# Patient Record
Sex: Female | Born: 1945
Health system: Southern US, Community
[De-identification: ages and names within clinical notes are randomized; demographics above are authoritative.]

## PROBLEM LIST (undated history)

## (undated) DIAGNOSIS — J189 Pneumonia, unspecified organism: Secondary | ICD-10-CM

## (undated) DIAGNOSIS — I82409 Acute embolism and thrombosis of unspecified deep veins of unspecified lower extremity: Secondary | ICD-10-CM

## (undated) DIAGNOSIS — M353 Polymyalgia rheumatica: Secondary | ICD-10-CM

## (undated) DIAGNOSIS — D689 Coagulation defect, unspecified: Secondary | ICD-10-CM

## (undated) DIAGNOSIS — C801 Malignant (primary) neoplasm, unspecified: Secondary | ICD-10-CM

## (undated) DIAGNOSIS — R0600 Dyspnea, unspecified: Secondary | ICD-10-CM

## (undated) DIAGNOSIS — I10 Essential (primary) hypertension: Secondary | ICD-10-CM

## (undated) DIAGNOSIS — E785 Hyperlipidemia, unspecified: Secondary | ICD-10-CM

## (undated) DIAGNOSIS — R112 Nausea with vomiting, unspecified: Secondary | ICD-10-CM

## (undated) DIAGNOSIS — I739 Peripheral vascular disease, unspecified: Secondary | ICD-10-CM

## (undated) DIAGNOSIS — D649 Anemia, unspecified: Secondary | ICD-10-CM

## (undated) DIAGNOSIS — C3490 Malignant neoplasm of unspecified part of unspecified bronchus or lung: Principal | ICD-10-CM

## (undated) DIAGNOSIS — M797 Fibromyalgia: Secondary | ICD-10-CM

## (undated) DIAGNOSIS — Z9889 Other specified postprocedural states: Secondary | ICD-10-CM

## (undated) DIAGNOSIS — Z720 Tobacco use: Secondary | ICD-10-CM

## (undated) HISTORY — DX: Anemia, unspecified: D64.9

## (undated) HISTORY — DX: Coagulation defect, unspecified: D68.9

## (undated) HISTORY — DX: Acute embolism and thrombosis of unspecified deep veins of unspecified lower extremity: I82.409

## (undated) HISTORY — PX: APPENDECTOMY: SHX54

## (undated) HISTORY — PX: LAMINECTOMY: SHX219

## (undated) HISTORY — PX: CHOLECYSTECTOMY: SHX55

## (undated) HISTORY — DX: Malignant neoplasm of unspecified part of unspecified bronchus or lung: C34.90

## (undated) HISTORY — PX: COLONOSCOPY: SHX174

## (undated) HISTORY — PX: PORTACATH PLACEMENT: SHX2246

## (undated) HISTORY — PX: TONSILLECTOMY: SUR1361

## (undated) HISTORY — PX: KNEE ARTHROSCOPY: SHX127

## (undated) HISTORY — DX: Polymyalgia rheumatica: M35.3

---

## 2001-11-25 ENCOUNTER — Encounter: Admission: RE | Admit: 2001-11-25 | Discharge: 2001-11-25 | Payer: Self-pay | Admitting: Family Medicine

## 2001-11-25 ENCOUNTER — Encounter: Payer: Self-pay | Admitting: Family Medicine

## 2003-07-23 ENCOUNTER — Encounter: Admission: RE | Admit: 2003-07-23 | Discharge: 2003-07-23 | Payer: Self-pay | Admitting: Family Medicine

## 2003-07-23 ENCOUNTER — Encounter: Payer: Self-pay | Admitting: Family Medicine

## 2003-11-25 ENCOUNTER — Encounter: Admission: RE | Admit: 2003-11-25 | Discharge: 2003-11-25 | Payer: Self-pay | Admitting: Orthopedic Surgery

## 2003-12-02 ENCOUNTER — Ambulatory Visit (HOSPITAL_BASED_OUTPATIENT_CLINIC_OR_DEPARTMENT_OTHER): Admission: RE | Admit: 2003-12-02 | Discharge: 2003-12-02 | Payer: Self-pay | Admitting: Orthopedic Surgery

## 2003-12-10 ENCOUNTER — Inpatient Hospital Stay (HOSPITAL_COMMUNITY): Admission: EM | Admit: 2003-12-10 | Discharge: 2003-12-13 | Payer: Self-pay | Admitting: Emergency Medicine

## 2003-12-10 ENCOUNTER — Ambulatory Visit (HOSPITAL_COMMUNITY): Admission: RE | Admit: 2003-12-10 | Discharge: 2003-12-10 | Payer: Self-pay | Admitting: Orthopaedic Surgery

## 2003-12-16 ENCOUNTER — Ambulatory Visit (HOSPITAL_COMMUNITY): Admission: RE | Admit: 2003-12-16 | Discharge: 2003-12-16 | Payer: Self-pay | Admitting: Family Medicine

## 2004-05-24 ENCOUNTER — Ambulatory Visit (HOSPITAL_COMMUNITY): Admission: RE | Admit: 2004-05-24 | Discharge: 2004-05-24 | Payer: Self-pay | Admitting: Family Medicine

## 2004-08-15 ENCOUNTER — Other Ambulatory Visit: Admission: RE | Admit: 2004-08-15 | Discharge: 2004-08-15 | Payer: Self-pay | Admitting: Family Medicine

## 2006-05-28 ENCOUNTER — Ambulatory Visit (HOSPITAL_COMMUNITY): Admission: RE | Admit: 2006-05-28 | Discharge: 2006-05-28 | Payer: Self-pay | Admitting: Family Medicine

## 2006-05-28 ENCOUNTER — Inpatient Hospital Stay (HOSPITAL_COMMUNITY): Admission: EM | Admit: 2006-05-28 | Discharge: 2006-05-29 | Payer: Self-pay | Admitting: Emergency Medicine

## 2006-05-28 ENCOUNTER — Encounter: Payer: Self-pay | Admitting: Vascular Surgery

## 2006-06-21 ENCOUNTER — Encounter: Admission: RE | Admit: 2006-06-21 | Discharge: 2006-06-21 | Payer: Self-pay | Admitting: Family Medicine

## 2006-09-23 ENCOUNTER — Encounter: Admission: RE | Admit: 2006-09-23 | Discharge: 2006-09-23 | Payer: Self-pay | Admitting: Family Medicine

## 2006-11-07 ENCOUNTER — Encounter: Admission: RE | Admit: 2006-11-07 | Discharge: 2006-11-07 | Payer: Self-pay | Admitting: Family Medicine

## 2006-11-08 ENCOUNTER — Encounter: Admission: RE | Admit: 2006-11-08 | Discharge: 2006-11-08 | Payer: Self-pay | Admitting: Family Medicine

## 2007-01-06 ENCOUNTER — Ambulatory Visit (HOSPITAL_COMMUNITY): Admission: RE | Admit: 2007-01-06 | Discharge: 2007-01-07 | Payer: Self-pay | Admitting: Neurosurgery

## 2007-11-04 ENCOUNTER — Encounter: Admission: RE | Admit: 2007-11-04 | Discharge: 2007-11-04 | Payer: Self-pay | Admitting: Family Medicine

## 2008-06-15 ENCOUNTER — Other Ambulatory Visit: Admission: RE | Admit: 2008-06-15 | Discharge: 2008-06-15 | Payer: Self-pay | Admitting: Family Medicine

## 2008-08-03 ENCOUNTER — Observation Stay (HOSPITAL_COMMUNITY): Admission: EM | Admit: 2008-08-03 | Discharge: 2008-08-05 | Payer: Self-pay | Admitting: Emergency Medicine

## 2008-08-03 ENCOUNTER — Encounter (INDEPENDENT_AMBULATORY_CARE_PROVIDER_SITE_OTHER): Payer: Self-pay | Admitting: Emergency Medicine

## 2008-08-03 ENCOUNTER — Ambulatory Visit: Payer: Self-pay | Admitting: Surgery

## 2008-08-18 ENCOUNTER — Ambulatory Visit: Payer: Self-pay | Admitting: Oncology

## 2008-09-21 LAB — CBC & DIFF AND RETIC
BASO%: 0.3 % (ref 0.0–2.0)
IRF: 0.31 (ref 0.130–0.330)
MCHC: 34.9 g/dL (ref 32.0–36.0)
MONO#: 0.4 10*3/uL (ref 0.1–0.9)
RBC: 4.84 10*6/uL (ref 3.70–5.32)
Retic %: 0.8 % (ref 0.4–2.3)
WBC: 6.7 10*3/uL (ref 3.9–10.0)
lymph#: 1.6 10*3/uL (ref 0.9–3.3)

## 2008-09-21 LAB — MORPHOLOGY
PLT EST: ADEQUATE
RBC Comments: NORMAL

## 2008-09-23 LAB — FACTOR 8 ASSAY: Coagulation Factor VIII: 145 % — ABNORMAL HIGH (ref 73–140)

## 2008-09-29 LAB — COMPREHENSIVE METABOLIC PANEL
ALT: 19 U/L (ref 0–35)
Alkaline Phosphatase: 43 U/L (ref 39–117)
Glucose, Bld: 95 mg/dL (ref 70–99)
Sodium: 142 mEq/L (ref 135–145)
Total Bilirubin: 0.4 mg/dL (ref 0.3–1.2)
Total Protein: 6.9 g/dL (ref 6.0–8.3)

## 2008-09-29 LAB — LUPUS ANTICOAGULANT PANEL
DRVVT: 61.8 secs — ABNORMAL HIGH (ref 36.1–47.0)
PTTLA 4:1 Mix: 41.7 secs (ref 36.3–48.8)

## 2008-09-29 LAB — D-DIMER, QUANTITATIVE: D-Dimer, Quant: 0.22 ug/mL-FEU (ref 0.00–0.48)

## 2008-10-08 ENCOUNTER — Encounter: Payer: Self-pay | Admitting: Family Medicine

## 2008-11-24 ENCOUNTER — Encounter: Admission: RE | Admit: 2008-11-24 | Discharge: 2008-11-24 | Payer: Self-pay | Admitting: Family Medicine

## 2009-09-13 ENCOUNTER — Ambulatory Visit: Payer: Self-pay | Admitting: Family Medicine

## 2009-09-13 DIAGNOSIS — Z86718 Personal history of other venous thrombosis and embolism: Secondary | ICD-10-CM

## 2009-09-13 DIAGNOSIS — I82409 Acute embolism and thrombosis of unspecified deep veins of unspecified lower extremity: Secondary | ICD-10-CM | POA: Insufficient documentation

## 2009-09-13 DIAGNOSIS — H409 Unspecified glaucoma: Secondary | ICD-10-CM | POA: Insufficient documentation

## 2009-09-13 DIAGNOSIS — I10 Essential (primary) hypertension: Secondary | ICD-10-CM | POA: Insufficient documentation

## 2009-09-13 DIAGNOSIS — E785 Hyperlipidemia, unspecified: Secondary | ICD-10-CM | POA: Insufficient documentation

## 2009-09-13 DIAGNOSIS — D649 Anemia, unspecified: Secondary | ICD-10-CM | POA: Insufficient documentation

## 2009-09-13 LAB — CONVERTED CEMR LAB
Bilirubin Urine: NEGATIVE
INR: 2.1
Ketones, urine, test strip: NEGATIVE
Nitrite: NEGATIVE
Protein, U semiquant: NEGATIVE
Prothrombin Time: 17.8 s
Urobilinogen, UA: 1

## 2009-09-21 LAB — CONVERTED CEMR LAB
ALT: 18 units/L (ref 0–35)
AST: 18 units/L (ref 0–37)
Alkaline Phosphatase: 42 units/L (ref 39–117)
BUN: 12 mg/dL (ref 6–23)
Basophils Absolute: 0 10*3/uL (ref 0.0–0.1)
Bilirubin, Direct: 0 mg/dL (ref 0.0–0.3)
Creatinine, Ser: 0.9 mg/dL (ref 0.4–1.2)
HCT: 43.6 % (ref 36.0–46.0)
Hemoglobin: 14.9 g/dL (ref 12.0–15.0)
Lymphocytes Relative: 26.8 % (ref 12.0–46.0)
MCHC: 34.1 g/dL (ref 30.0–36.0)
MCV: 92.6 fL (ref 78.0–100.0)
Neutrophils Relative %: 63.2 % (ref 43.0–77.0)
RBC: 4.71 M/uL (ref 3.87–5.11)
RDW: 12.5 % (ref 11.5–14.6)
TSH: 0.97 microintl units/mL (ref 0.35–5.50)
Total Protein: 7 g/dL (ref 6.0–8.3)

## 2009-09-22 ENCOUNTER — Encounter: Payer: Self-pay | Admitting: Family Medicine

## 2009-09-27 ENCOUNTER — Ambulatory Visit: Payer: Self-pay | Admitting: Family Medicine

## 2009-09-27 DIAGNOSIS — E538 Deficiency of other specified B group vitamins: Secondary | ICD-10-CM

## 2009-09-30 ENCOUNTER — Encounter (INDEPENDENT_AMBULATORY_CARE_PROVIDER_SITE_OTHER): Payer: Self-pay | Admitting: *Deleted

## 2009-10-01 ENCOUNTER — Encounter: Admission: RE | Admit: 2009-10-01 | Discharge: 2009-10-01 | Payer: Self-pay | Admitting: Orthopedic Surgery

## 2009-10-04 ENCOUNTER — Ambulatory Visit: Payer: Self-pay | Admitting: Family Medicine

## 2009-10-06 ENCOUNTER — Ambulatory Visit: Payer: Self-pay | Admitting: Gastroenterology

## 2009-10-11 ENCOUNTER — Ambulatory Visit: Payer: Self-pay | Admitting: Family Medicine

## 2009-10-18 ENCOUNTER — Ambulatory Visit: Payer: Self-pay | Admitting: Family Medicine

## 2009-10-18 LAB — CONVERTED CEMR LAB
INR: 3
Prothrombin Time: 20.8 s

## 2009-10-25 ENCOUNTER — Ambulatory Visit: Payer: Self-pay | Admitting: Family Medicine

## 2009-11-01 ENCOUNTER — Ambulatory Visit: Payer: Self-pay | Admitting: Family Medicine

## 2009-11-08 ENCOUNTER — Ambulatory Visit: Payer: Self-pay | Admitting: Family Medicine

## 2009-11-15 ENCOUNTER — Ambulatory Visit: Payer: Self-pay | Admitting: Family Medicine

## 2009-11-21 ENCOUNTER — Encounter: Payer: Self-pay | Admitting: Gastroenterology

## 2009-11-21 ENCOUNTER — Telehealth: Payer: Self-pay | Admitting: Gastroenterology

## 2009-11-28 ENCOUNTER — Ambulatory Visit: Payer: Self-pay | Admitting: Gastroenterology

## 2009-12-14 ENCOUNTER — Ambulatory Visit: Payer: Self-pay | Admitting: Family Medicine

## 2009-12-26 ENCOUNTER — Ambulatory Visit: Payer: Self-pay | Admitting: Family Medicine

## 2009-12-26 DIAGNOSIS — M545 Low back pain: Secondary | ICD-10-CM | POA: Insufficient documentation

## 2009-12-26 LAB — CONVERTED CEMR LAB
INR: 3.1
Ketones, urine, test strip: NEGATIVE
Nitrite: NEGATIVE
Prothrombin Time: 21.2 s
Urobilinogen, UA: 0.2
pH: 6

## 2010-01-02 ENCOUNTER — Ambulatory Visit: Payer: Self-pay | Admitting: Family Medicine

## 2010-01-12 ENCOUNTER — Ambulatory Visit: Payer: Self-pay | Admitting: Family Medicine

## 2010-01-12 LAB — CONVERTED CEMR LAB: Prothrombin Time: 18.2 s

## 2010-01-19 ENCOUNTER — Ambulatory Visit: Payer: Self-pay | Admitting: Family Medicine

## 2010-01-26 ENCOUNTER — Ambulatory Visit: Payer: Self-pay | Admitting: Family Medicine

## 2010-02-02 ENCOUNTER — Ambulatory Visit: Payer: Self-pay | Admitting: Family Medicine

## 2010-02-09 ENCOUNTER — Ambulatory Visit: Payer: Self-pay | Admitting: Family Medicine

## 2010-03-02 ENCOUNTER — Ambulatory Visit: Payer: Self-pay | Admitting: Family Medicine

## 2010-03-02 ENCOUNTER — Telehealth: Payer: Self-pay | Admitting: Family Medicine

## 2010-04-05 ENCOUNTER — Ambulatory Visit: Payer: Self-pay | Admitting: Family Medicine

## 2010-04-05 LAB — CONVERTED CEMR LAB
INR: 1.7
Prothrombin Time: 16.2 s

## 2010-04-18 ENCOUNTER — Ambulatory Visit: Payer: Self-pay | Admitting: Family Medicine

## 2010-04-18 DIAGNOSIS — M79609 Pain in unspecified limb: Secondary | ICD-10-CM | POA: Insufficient documentation

## 2010-05-18 ENCOUNTER — Ambulatory Visit: Payer: Self-pay | Admitting: Family Medicine

## 2010-05-19 LAB — CONVERTED CEMR LAB
AST: 20 units/L (ref 0–37)
Alkaline Phosphatase: 37 units/L — ABNORMAL LOW (ref 39–117)
BUN: 20 mg/dL (ref 6–23)
Basophils Absolute: 0 10*3/uL (ref 0.0–0.1)
Basophils Relative: 0.5 % (ref 0.0–3.0)
Bilirubin, Direct: 0.1 mg/dL (ref 0.0–0.3)
Calcium: 10.4 mg/dL (ref 8.4–10.5)
Creatinine, Ser: 0.8 mg/dL (ref 0.4–1.2)
Eosinophils Absolute: 0.1 10*3/uL (ref 0.0–0.7)
Eosinophils Relative: 2.5 % (ref 0.0–5.0)
GFR calc non Af Amer: 72.27 mL/min (ref >60)
HCT: 44.2 % (ref 36.0–46.0)
Lymphocytes Relative: 27.8 % (ref 12.0–46.0)
Lymphs Abs: 1.6 10*3/uL (ref 0.7–4.0)
MCHC: 34.5 g/dL (ref 30.0–36.0)
MCV: 92 fL (ref 78.0–100.0)
Monocytes Absolute: 0.5 10*3/uL (ref 0.1–1.0)
Monocytes Relative: 8.1 % (ref 3.0–12.0)
Potassium: 4.4 meq/L (ref 3.5–5.1)
Total CHOL/HDL Ratio: 3
WBC: 5.6 10*3/uL (ref 4.5–10.5)

## 2010-07-18 ENCOUNTER — Ambulatory Visit: Payer: Self-pay | Admitting: Family Medicine

## 2010-12-05 ENCOUNTER — Ambulatory Visit
Admission: RE | Admit: 2010-12-05 | Discharge: 2010-12-05 | Payer: Self-pay | Source: Home / Self Care | Attending: Family Medicine | Admitting: Family Medicine

## 2010-12-16 ENCOUNTER — Encounter: Payer: Self-pay | Admitting: Orthopedic Surgery

## 2010-12-21 ENCOUNTER — Ambulatory Visit
Admission: RE | Admit: 2010-12-21 | Discharge: 2010-12-21 | Payer: Self-pay | Source: Home / Self Care | Attending: Family Medicine | Admitting: Family Medicine

## 2010-12-21 DIAGNOSIS — M25519 Pain in unspecified shoulder: Secondary | ICD-10-CM | POA: Insufficient documentation

## 2010-12-26 NOTE — Assessment & Plan Note (Signed)
Summary: b12 inj/njr  Nurse Visit   Allergies: 1)  ! Morphine 2)  ! Codeine  Medication Administration  Injection # 1:    Medication: Vit B12 1000 mcg    Diagnosis: VITAMIN B12 DEFICIENCY (ICD-266.2)    Route: IM    Site: L deltoid    Exp Date: 07/2011    Lot #: [id number]    Mfr: American Regent    Patient tolerated injection without complications    Given by: Duard Brady LPN (March 02, 4009 10:14 AM)  Orders Added: 1)  Vit B12 1000 mcg [J3420] 2)  Admin of Therapeutic Inj  intramuscular or subcutaneous [27253]

## 2010-12-26 NOTE — Assessment & Plan Note (Signed)
Summary: B-12 INJ/CJR  Nurse Visit   Allergies: 1)  ! Morphine 2)  ! Codeine  Medication Administration  Injection # 1:    Medication: Vit B12 1000 mcg    Diagnosis: VITAMIN B12 DEFICIENCY (ICD-266.2)    Route: IM    Site: L deltoid    Exp Date: 9/12    Lot #: [id number]    Mfr: American Regent    Patient tolerated injection without complications    Given by: Alfred Levins, CMA (January 26, 2010 9:56 AM)  Orders Added: 1)  Vit B12 1000 mcg [J3420] 2)  Admin of Therapeutic Inj  intramuscular or subcutaneous [73710]

## 2010-12-26 NOTE — Assessment & Plan Note (Signed)
Summary: rov/mm   Vital Signs:  Patient profile:   65 year old female Weight:      140 pounds BP sitting:   110 / 74  (left arm) Cuff size:   regular  Vitals Entered By: Raechel Ache, RN (May 18, 2010 8:43 AM) CC: ROV, fasting for labs. C/o back pain.   History of Present Illness: Here for labs and to discuss low back pain. For the past 8 months she has had daily sharp pains in the right lower back. These are worse after she has been sitiing awhile or first thing in the mornings. No radiaiton to the legs. She takes nothing for this.   Allergies: 1)  ! Morphine 2)  ! Codeine  Past History:  Past Medical History: Reviewed history from 09/13/2009 and no changes required. DVT, hx of, has had 2 in the right calf and one in the left calf. Now on lifetime anticoagulation.  hypercoagulability workup was negative per Dr. Cyndie Chime glaucoma, sees Dr. Hazle Quant Hyperlipidemia Hypertension Anemia-NOS  Past Surgical History: Reviewed history from 12/26/2009 and no changes required. Appendectomy Cholecystectomy Tonsillectomy right knee arthroscopy per Dr. Chaney Malling 2005 Lumbar laminectomy per Dr. Newell Coral, 2009 colonoscopy 11-28-09 per Dr. Arlyce Dice, normal, repeat in 10 yrs  Review of Systems  The patient denies anorexia, fever, weight loss, weight gain, vision loss, decreased hearing, hoarseness, chest pain, syncope, dyspnea on exertion, peripheral edema, prolonged cough, headaches, hemoptysis, abdominal pain, melena, hematochezia, severe indigestion/heartburn, hematuria, incontinence, genital sores, muscle weakness, suspicious skin lesions, transient blindness, difficulty walking, depression, unusual weight change, abnormal bleeding, enlarged lymph nodes, angioedema, breast masses, and testicular masses.    Physical Exam  General:  Well-developed,well-nourished,in no acute distress; alert,appropriate and cooperative throughout examination Neck:  No deformities, masses, or tenderness  noted. Lungs:  Normal respiratory effort, chest expands symmetrically. Lungs are clear to auscultation, no crackles or wheezes. Heart:  Normal rate and regular rhythm. S1 and S2 normal without gallop, murmur, click, rub or other extra sounds. Msk:  tender in the lower back over the area of her previous surgery. No spasm, full ROM   Impression & Recommendations:  Problem # 1:  HYPERTENSION (ICD-401.9)  Her updated medication list for this problem includes:    Hydrochlorothiazide 25 Mg Tabs (Hydrochlorothiazide) ..... One by mouth daily  Orders: Venipuncture ([address]) TLB-Lipid Panel (80061-LIPID) TLB-BMP (Basic Metabolic Panel-BMET) (80048-METABOL) TLB-CBC Platelet - w/Differential (85025-CBCD) TLB-Hepatic/Liver Function Pnl (80076-HEPATIC) TLB-TSH (Thyroid Stimulating Hormone) (84443-TSH)  Problem # 2:  HYPERLIPIDEMIA (ICD-272.4)  Her updated medication list for this problem includes:    Simvastatin 20 Mg Tabs (Simvastatin) ..... One by mouth daily  Problem # 3:  BACK PAIN, LUMBAR (ICD-724.2)  Her updated medication list for this problem includes:    Tramadol Hcl 50 Mg Tabs (Tramadol hcl) .Marland Kitchen... 1 q 4 hours as needed pain  Orders: TLB-Uric Acid, Blood (84550-URIC)  Problem # 4:  VITAMIN B12 DEFICIENCY (ICD-266.2)  Orders: Vit B12 1000 mcg (J3420) Admin of Therapeutic Inj  intramuscular or subcutaneous (60454)  Complete Medication List: 1)  Coumadin 5 Mg Tabs (Warfarin sodium) .... As directed 2)  Hydrochlorothiazide 25 Mg Tabs (Hydrochlorothiazide) .... One by mouth daily 3)  Simvastatin 20 Mg Tabs (Simvastatin) .... One by mouth daily 4)  Caltrate 600+d 600-400 Mg-unit Tabs (Calcium carbonate-vitamin d) .... One tablet by mouth once daily 5)  Tramadol Hcl 50 Mg Tabs (Tramadol hcl) .Marland Kitchen.. 1 q 4 hours as needed pain 6)  Chelated Potassium 99 Mg Tabs (Potassium) .Marland Kitchen.. 1 once  daily  Other Orders: Protime ([address]UE) Fingerstick (45409)  Patient Instructions: 1)  get labs.  Try Tramadol again for pain.  Prescriptions: TRAMADOL HCL 50 MG TABS (TRAMADOL HCL) 1 q 4 hours as needed pain  #60 x 5   Entered and Authorized by:   [name] MD   Signed by:   [name] MD on 05/18/2010   Method used:   Print then Give to Patient   RxID:   [id number]    Medication Administration  Injection # 1:    Medication: Vit B12 1000 mcg    Diagnosis: VITAMIN B12 DEFICIENCY (ICD-266.2)    Route: IM    Site: R deltoid    Exp Date: 04/13    Lot #: [id number]    Mfr: APP    Patient tolerated injection without complications    Given by: Raechel Ache, RN (May 18, 2010 9:25 AM)  Orders Added: 1)  Venipuncture [96295] 2)  TLB-Lipid Panel [80061-LIPID] 3)  TLB-BMP (Basic Metabolic Panel-BMET) [80048-METABOL] 4)  TLB-CBC Platelet - w/Differential [85025-CBCD] 5)  TLB-Hepatic/Liver Function Pnl [80076-HEPATIC] 6)  TLB-TSH (Thyroid Stimulating Hormone) [84443-TSH] 7)  TLB-Uric Acid, Blood [84550-URIC] 8)  Est. Patient Level IV [28413] 9)  Vit B12 1000 mcg [J3420] 10)  Admin of Therapeutic Inj  intramuscular or subcutaneous [96372] 11)  Protime [85610QW] 12)  Fingerstick [36416]   Laboratory Results   Blood Tests   Date/Time Recieved: May 18, 2010 9:29 AM  Date/Time Reported: May 18, 2010 9:29 AM    INR: 3.1   (Normal Range: 0.88-1.12   Therap INR: 2.0-3.5) Comments: Wynona Canes, CMA  May 18, 2010 9:29 AM       ANTICOAGULATION RECORD PREVIOUS REGIMEN & LAB RESULTS Anticoagulation Diagnosis:  v58.83,453.41 on  01/12/2010 Previous INR Goal Range:  2.0-3.0 on  01/12/2010 Previous INR:  1.7 on  04/05/2010 Previous Coumadin Dose(mg):  5mg  on w,sat,sun & 7.5mg  other days on  01/12/2010 Previous Regimen:  7.5mg . QD on  04/05/2010 Previous Coagulation Comments:  Pt. will d/c 10/21/09 due to surgery, will resume dosage after and F/U 2 wks.  on  10/18/2009  NEW REGIMEN & LAB RESULTS Current INR: 3.1 Current Coumadin Dose(mg): 7.5mg  qd Regimen:  7.5mg . QD  (no change)       Repeat testing in: 4 weeks MEDICATIONS COUMADIN 5 MG TABS (WARFARIN SODIUM) as directed HYDROCHLOROTHIAZIDE 25 MG TABS (HYDROCHLOROTHIAZIDE) one by mouth daily SIMVASTATIN 20 MG TABS (SIMVASTATIN) one by mouth daily CALTRATE 600+D 600-400 MG-UNIT TABS (CALCIUM CARBONATE-VITAMIN D) one tablet by mouth once daily TRAMADOL HCL 50 MG TABS (TRAMADOL HCL) 1 q 4 hours as needed pain CHELATED POTASSIUM 99 MG TABS (POTASSIUM) 1 once daily   Anticoagulation Visit Questionnaire      Coumadin dose missed/changed:  No      Abnormal Bleeding Symptoms:  No   Any diet changes including alcohol intake, vegetables or greens since the last visit:  No Any illnesses or hospitalizations since the last visit:  No Any signs of clotting since the last visit (including chest discomfort, dizziness, shortness of breath, arm tingling, slurred speech, swelling or redness in leg):  No

## 2010-12-26 NOTE — Assessment & Plan Note (Signed)
Summary: ?uti/njr   Vital Signs:  Patient profile:   65 year old female Weight:      144 pounds Temp:     98.4 degrees F oral BP sitting:   124 / 84  (left arm) Cuff size:   regular  Vitals Entered By: [name], CMA (December 26, 2009 11:33 AM) CC: uti   History of Present Illness: Here for 2 reasons. First she has been having some intermittent sharp pains in the lower back which remind her of what it felt like before her surgery a few years ago. The pains  do not  radiate to the legs, no numbness or weakness in the legs. Using Tylrnol only. Second for several days she has some urgency to urinate and some burning. Drinking plenty of water.   Allergies: 1)  ! Morphine 2)  ! Codeine  Past History:  Past Medical History: Reviewed history from 09/13/2009 and no changes required. DVT, hx of, has had 2 in the right calf and one in the left calf. Now on lifetime anticoagulation.  hypercoagulability workup was negative per Dr. Cyndie [name] glaucoma, sees Dr. Hazle [name] Hyperlipidemia Hypertension Anemia-NOS  Past Surgical History: Appendectomy Cholecystectomy Tonsillectomy right knee arthroscopy per Dr. Chaney [name] 2005 Lumbar laminectomy per Dr. Newell [name], 2009 colonoscopy 11-28-09 per Dr. Arlyce [name], normal, repeat in 10 yrs  Review of Systems  The patient denies anorexia, fever, weight loss, weight gain, vision loss, decreased hearing, hoarseness, chest pain, syncope, dyspnea on exertion, peripheral edema, prolonged cough, headaches, hemoptysis, abdominal pain, melena, hematochezia, severe indigestion/heartburn, hematuria, incontinence, genital sores, muscle weakness, suspicious skin lesions, transient blindness, difficulty walking, depression, unusual weight change, abnormal bleeding, enlarged lymph nodes, angioedema, breast masses, and testicular masses.    Physical Exam  General:  Well-developed,well-nourished,in no acute distress; alert,appropriate and cooperative throughout  examination Abdomen:  Bowel sounds positive,abdomen soft and non-tender without masses, organomegaly or hernias noted. Msk:  No deformity or scoliosis noted of thoracic or lumbar spine.  Negative SLR   Impression & Recommendations:  Problem # 1:  UTI (ICD-599.0)  Her updated medication list for this problem includes:    Macrobid 100 Mg Caps (Nitrofurantoin monohyd macro) .Marland Kitchen..Marland Kitchen Two times a day  Problem # 2:  BACK PAIN, LUMBAR (ICD-724.2)  Her updated medication list for this problem includes:    Tramadol Hcl 50 Mg Tabs (Tramadol hcl) .Marland Kitchen... 1 q 4 hours as needed pain  Complete Medication List: 1)  Coumadin 5 Mg Tabs (Warfarin sodium) .... As directed 2)  Hydrochlorothiazide 25 Mg Tabs (Hydrochlorothiazide) .... One by mouth daily 3)  Simvastatin 20 Mg Tabs (Simvastatin) .... One by mouth daily 4)  Caltrate 600+d 600-400 Mg-unit Tabs (Calcium carbonate-vitamin d) .... One tablet by mouth once daily 5)  Tramadol Hcl 50 Mg Tabs (Tramadol hcl) .Marland Kitchen.. 1 q 4 hours as needed pain 6)  Macrobid 100 Mg Caps (Nitrofurantoin monohyd macro) .... Two times a day  Other Orders: Fingerstick (36416) TLB-Udip ONLY (81003-UDIP) Protime (16[address]UE) Vit B12 1000 mcg (J3420) Admin of Therapeutic Inj  intramuscular or subcutaneous (45409)  Patient Instructions: 1)  Please schedule a follow-up appointment as needed .  Prescriptions: MACROBID 100 MG CAPS (NITROFURANTOIN MONOHYD MACRO) two times a day  #14 x 0   Entered and Authorized by:   [name] MD   Signed by:   [name] MD on 12/26/2009   Method used:   Print then Give to Patient   RxID:   [id number] COUMADIN 5 MG TABS (WARFARIN SODIUM) as  directed  #100 x 11   Entered and Authorized by:   [name] MD   Signed by:   [name] MD on 12/26/2009   Method used:   Print then Give to Patient   RxID:   [id number] TRAMADOL HCL 50 MG TABS (TRAMADOL HCL) 1 q 4 hours as needed pain  #60 x 2   Entered and Authorized by:    [name] MD   Signed by:   [name] MD on 12/26/2009   Method used:   Print then Give to Patient   RxID:   [id number]   Laboratory Results   Urine Tests    Routine Urinalysis   Color: yellow Appearance: Clear Glucose: negative   (Normal Range: Negative) Bilirubin: negative   (Normal Range: Negative) Ketone: negative   (Normal Range: Negative) Spec. Gravity: 1.010   (Normal Range: 1.003-1.035) Blood: 1+   (Normal Range: Negative) pH: 6.0   (Normal Range: 5.0-8.0) Protein: negative   (Normal Range: Negative) Urobilinogen: 0.2   (Normal Range: 0-1) Nitrite: negative   (Normal Range: Negative) Leukocyte Esterace: trace   (Normal Range: Negative)    Comments: [name]  December 26, 2009 11:25 AM   Blood Tests     PT: 21.2 s   (Normal Range: 10.6-13.4)  INR: 3.1   (Normal Range: 0.88-1.12   Therap INR: 2.0-3.5) Comments: [name]  December 26, 2009 11:42 AM        ANTICOAGULATION RECORD PREVIOUS REGIMEN & LAB RESULTS   Previous INR:  1.5 on  12/14/2009  Previous Regimen:  7.5mg  QD on  12/14/2009 Previous Coagulation Comments:  Pt. will d/c 10/21/09 due to surgery, will resume dosage after and F/U 2 wks.  on  10/18/2009  NEW REGIMEN & LAB RESULTS Current INR: 3.1 Regimen: 7.5mg  QD  (no change)   Anticoagulation Visit Questionnaire Coumadin dose missed/changed:  No Abnormal Bleeding Symptoms:  No  Any diet changes including alcohol intake, vegetables or greens since the last visit:  No Any illnesses or hospitalizations since the last visit:  No Any signs of clotting since the last visit (including chest discomfort, dizziness, shortness of breath, arm tingling, slurred speech, swelling or redness in leg):  No  MEDICATIONS COUMADIN 5 MG TABS (WARFARIN SODIUM) as directed HYDROCHLOROTHIAZIDE 25 MG TABS (HYDROCHLOROTHIAZIDE) one by mouth daily SIMVASTATIN 20 MG TABS (SIMVASTATIN) one by mouth daily CALTRATE 600+D 600-400 MG-UNIT TABS  (CALCIUM CARBONATE-VITAMIN D) one tablet by mouth once daily TRAMADOL HCL 50 MG TABS (TRAMADOL HCL) 1 q 4 hours as needed pain MACROBID 100 MG CAPS (NITROFURANTOIN MONOHYD MACRO) two times a day    Medication Administration  Injection # 1:    Medication: Vit B12 1000 mcg    Diagnosis: VITAMIN B12 DEFICIENCY (ICD-266.2)    Route: IM    Site: L deltoid    Exp Date: 9/12    Lot #: [id number]    Mfr: American Regent    Patient tolerated injection without complications    Given by: [name], CMA (December 26, 2009 11:50 AM)  Orders Added: 1)  Fingerstick [36416] 2)  TLB-Udip ONLY [81003-UDIP] 3)  Protime [57322GU] 4)  Vit B12 1000 mcg [J3420] 5)  Admin of Therapeutic Inj  intramuscular or subcutaneous [96372] 6)  Est. Patient Level IV [54270]  Appended Document: ?uti/njr   ANTICOAGULATION RECORD PREVIOUS REGIMEN & LAB RESULTS   Previous INR:  3.1 on  12/26/2009  Previous Regimen:  7.5mg  QD on  12/14/2009 Previous  Coagulation Comments:  Pt. will d/c 10/21/09 due to surgery, will resume dosage after and F/U 2 wks.  on  10/18/2009  NEW REGIMEN & LAB RESULTS Current INR: 3.1 Regimen: 5mg . Wed. Sat. and Sun. all others 7.5mg .  Repeat testing in: 2 weeks  Anticoagulation Visit Questionnaire Coumadin dose missed/changed:  No Abnormal Bleeding Symptoms:  No  Any diet changes including alcohol intake, vegetables or greens since the last visit:  No Any illnesses or hospitalizations since the last visit:  No Any signs of clotting since the last visit (including chest discomfort, dizziness, shortness of breath, arm tingling, slurred speech, swelling or redness in leg):  No  MEDICATIONS COUMADIN 5 MG TABS (WARFARIN SODIUM) as directed HYDROCHLOROTHIAZIDE 25 MG TABS (HYDROCHLOROTHIAZIDE) one by mouth daily SIMVASTATIN 20 MG TABS (SIMVASTATIN) one by mouth daily CALTRATE 600+D 600-400 MG-UNIT TABS (CALCIUM CARBONATE-VITAMIN D) one tablet by mouth once daily TRAMADOL HCL 50 MG TABS  (TRAMADOL HCL) 1 q 4 hours as needed pain MACROBID 100 MG CAPS (NITROFURANTOIN MONOHYD MACRO) two times a day    Laboratory Results   Blood Tests     PT: 21.2 s   (Normal Range: 10.6-13.4)  INR: 3.1   (Normal Range: 0.88-1.12   Therap INR: 2.0-3.5) Comments: [name]  December 26, 2009 12:17 PM

## 2010-12-26 NOTE — Assessment & Plan Note (Signed)
Summary: b12 inj/njr  Nurse Visit   Allergies: 1)  ! Morphine 2)  ! Codeine  Medication Administration  Injection # 1:    Medication: Vit B12 1000 mcg    Diagnosis: VITAMIN B12 DEFICIENCY (ICD-266.2)    Route: IM    Site: L deltoid    Exp Date: 07/2011    Lot #: [id number]    Mfr: American Regent    Patient tolerated injection without complications    Given by: Raechel Ache, RN (February 09, 2010 10:10 AM)  Orders Added: 1)  Vit B12 1000 mcg [J3420] 2)  Admin of Therapeutic Inj  intramuscular or subcutaneous [96045]

## 2010-12-26 NOTE — Assessment & Plan Note (Signed)
Summary: pt and add b-12 for 266.2/per Dr Fry/cjr rsc per husb/njr  Nurse Visit   Allergies: 1)  ! Morphine 2)  ! Codeine Laboratory Results   Blood Tests     PT: 16.2 s   (Normal Range: 10.6-13.4)  INR: 1.7   (Normal Range: 0.88-1.12   Therap INR: 2.0-3.5) Comments: Rita Ohara  Apr 05, 2010 2:22 PM     Orders Added: 1)  Venipuncture [36415] 2)  TLB-B12, Serum-Total ONLY [82607-B12] 3)  Protime [16109UE]   ANTICOAGULATION RECORD PREVIOUS REGIMEN & LAB RESULTS Anticoagulation Diagnosis:  v58.83,453.41 on  01/12/2010 Previous INR Goal Range:  2.0-3.0 on  01/12/2010 Previous INR:  2.2 on  01/12/2010 Previous Coumadin Dose(mg):  5mg  on w,sat,sun & 7.5mg  other days on  01/12/2010 Previous Regimen:  5mg . Wed. Sat. and Sun. all others 7.5mg . on  12/26/2009 Previous Coagulation Comments:  Pt. will d/c 10/21/09 due to surgery, will resume dosage after and F/U 2 wks.  on  10/18/2009  NEW REGIMEN & LAB RESULTS Current INR: 1.7 Regimen: 7.5mg . QD  Repeat testing in: 4 weeks  Anticoagulation Visit Questionnaire Coumadin dose missed/changed:  No Abnormal Bleeding Symptoms:  No  Any diet changes including alcohol intake, vegetables or greens since the last visit:  No Any illnesses or hospitalizations since the last visit:  No Any signs of clotting since the last visit (including chest discomfort, dizziness, shortness of breath, arm tingling, slurred speech, swelling or redness in leg):  No  MEDICATIONS COUMADIN 5 MG TABS (WARFARIN SODIUM) as directed HYDROCHLOROTHIAZIDE 25 MG TABS (HYDROCHLOROTHIAZIDE) one by mouth daily SIMVASTATIN 20 MG TABS (SIMVASTATIN) one by mouth daily CALTRATE 600+D 600-400 MG-UNIT TABS (CALCIUM CARBONATE-VITAMIN D) one tablet by mouth once daily TRAMADOL HCL 50 MG TABS (TRAMADOL HCL) 1 q 4 hours as needed pain

## 2010-12-26 NOTE — Procedures (Signed)
Summary: Colonoscopy  Patient: [name] Note: All result statuses are Final unless otherwise noted.  Tests: (1) Colonoscopy (COL)   COL Colonoscopy           DONE     [facility]     [address]     [place], [place]  [address]           COLONOSCOPY PROCEDURE REPORT           PATIENT:  [name], [name]  MR#:  [id number]     BIRTHDATE:  Apr 19, 1946, 63 yrs. old  GENDER:  female           ENDOSCOPIST:  Barbette Hair. Arlyce Dice, MD     Referred by:  Tera Mater Clent Ridges, M.D.           PROCEDURE DATE:  11/28/2009     PROCEDURE:  Colonoscopy, Diagnostic     ASA CLASS:  Class II     INDICATIONS:  Routine Risk Screening Aunt and uncle with colon CA                 MEDICATIONS:   Fentanyl 75 mcg IV, Versed 8 mg IV           DESCRIPTION OF PROCEDURE:   After the risks benefits and     alternatives of the procedure were thoroughly explained, informed     consent was obtained.  Digital rectal exam was performed and     revealed no abnormalities.   The LB [id number] endoscope     was introduced through the anus and advanced to the cecum, which     was identified by both the appendix and ileocecal valve, without     limitations.  The quality of the prep was excellent, using     MoviPrep.  The instrument was then slowly withdrawn as the colon     was fully examined.     <<PROCEDUREIMAGES>>           FINDINGS:  A normal appearing cecum, ileocecal valve, and     appendiceal orifice were identified. The ascending, hepatic     flexure, transverse, splenic flexure, descending, sigmoid colon,     and rectum appeared unremarkable (see image1, image2, image3,     image4, image5, image8, image9, image10, image12, and image13).     Retroflexed views in the rectum revealed no abnormalities.    The     scope was then withdrawn from the patient and the procedure     completed.           COMPLICATIONS:  None           ENDOSCOPIC IMPRESSION:     1) Normal colon      RECOMMENDATIONS:     1) Continue current colorectal screening recommendations for     "routine risk" patients with a repeat colonoscopy in 10 years.           REPEAT EXAM:  In 10 year(s) for Colonoscopy.           ______________________________     Barbette Hair. Arlyce Dice, MD           CC:           n.     eSIGNED:   Barbette Hair. Kaplan at 11/28/2009 09:58 AM           Yisroel Ramming, [id number]  Note: An exclamation mark (!) indicates a result that was not dispersed into the flowsheet. Document Creation  Date: 11/28/2009 9:58 AM _______________________________________________________________________  (1) Order result status: Final Collection or observation date-time: 11/28/2009 09:52 Requested date-time:  Receipt date-time:  Reported date-time:  Referring Physician:   Ordering Physician: Melvia Heaps [phone]) Specimen Source:  Source: Launa Grill Order Number: [id number] Lab site:   Appended Document: Colonoscopy    Clinical Lists Changes  Observations: Added new observation of COLONNXTDUE: 11/2019 (11/28/2009 12:40)

## 2010-12-26 NOTE — Assessment & Plan Note (Signed)
Summary: B12 INJ//LH---PT [facility] // RS  Nurse Visit   Allergies: 1)  ! Morphine 2)  ! Codeine Laboratory Results   Blood Tests     PT: 15.2 s   (Normal Range: 10.6-13.4)  INR: 1.5   (Normal Range: 0.88-1.12   Therap INR: 2.0-3.5) Comments: Rita Ohara  December 14, 2009 2:53 PM     Medication Administration  Injection # 1:    Medication: Vit B12 1000 mcg    Diagnosis: VITAMIN B12 DEFICIENCY (ICD-266.2)    Route: IM    Site: R deltoid    Exp Date: 4/12    Lot #: [id number]    Mfr: American Regent    Patient tolerated injection without complications    Given by: Alfred Levins, CMA (December 14, 2009 2:56 PM)  Orders Added: 1)  Protime [[address]UE] 2)  Fingerstick [45409] 3)  Vit B12 1000 mcg [J3420] 4)  Admin of Therapeutic Inj  intramuscular or subcutaneous [96372]   ANTICOAGULATION RECORD PREVIOUS REGIMEN & LAB RESULTS   Previous INR:  3.0 on  10/18/2009  Previous Regimen:  same on  09/13/2009 Previous Coagulation Comments:  Pt. will d/c 10/21/09 due to surgery, will resume dosage after and F/U 2 wks.  on  10/18/2009  NEW REGIMEN & LAB RESULTS Current INR: 1.5 Regimen: 7.5mg  QD  Repeat testing in: 2 weeks  Anticoagulation Visit Questionnaire Coumadin dose missed/changed:  No Abnormal Bleeding Symptoms:  No  Any diet changes including alcohol intake, vegetables or greens since the last visit:  No Any illnesses or hospitalizations since the last visit:  No Any signs of clotting since the last visit (including chest discomfort, dizziness, shortness of breath, arm tingling, slurred speech, swelling or redness in leg):  No  MEDICATIONS COUMADIN 5 MG TABS (WARFARIN SODIUM) 7.5 mg. 3 x weekly 5 mg. 4 x weekly HYDROCHLOROTHIAZIDE 25 MG TABS (HYDROCHLOROTHIAZIDE) one by mouth daily SIMVASTATIN 20 MG TABS (SIMVASTATIN) one by mouth daily CALTRATE 600+D 600-400 MG-UNIT TABS (CALCIUM CARBONATE-VITAMIN D) one tablet by mouth once daily MOVIPREP 100 GM  SOLR  (PEG-KCL-NACL-NASULF-NA ASC-C) As per prep instructions.

## 2010-12-26 NOTE — Assessment & Plan Note (Signed)
Summary: B-12 INJ/CJR  Nurse Visit   Allergies: 1)  ! Morphine 2)  ! Codeine  Medication Administration  Injection # 1:    Medication: Vit B12 1000 mcg    Diagnosis: VITAMIN B12 DEFICIENCY (ICD-266.2)    Route: IM    Site: R deltoid    Exp Date: 9/12    Lot #: [id number]    Mfr: American Regent    Patient tolerated injection without complications    Given by: Alfred Levins, CMA (January 19, 2010 10:37 AM)  Orders Added: 1)  Vit B12 1000 mcg [J3420] 2)  Admin of Therapeutic Inj  intramuscular or subcutaneous [[address]]

## 2010-12-26 NOTE — Assessment & Plan Note (Signed)
Summary: b12 inj/njr  Nurse Visit   Allergies: 1)  ! Morphine 2)  ! Codeine  Medication Administration  Injection # 1:    Medication: Vit B12 1000 mcg    Diagnosis: VITAMIN B12 DEFICIENCY (ICD-266.2)    Route: IM    Site: R deltoid    Exp Date: 9/12    Lot #: [id number]    Mfr: [employer]    Patient tolerated injection without complications    Given by: Alfred Levins, CMA (February 02, 2010 9:39 AM)  Orders Added: 1)  Vit B12 1000 mcg [J3420] 2)  Admin of Therapeutic Inj  intramuscular or subcutaneous [56213]

## 2010-12-26 NOTE — Assessment & Plan Note (Signed)
Summary: swollen rt foot and ankle/red/cjr   Vital Signs:  Patient profile:   65 year old female Weight:      140 pounds BMI:     23.75 Temp:     98.3 degrees F oral BP sitting:   112 / 88  (left arm) Cuff size:   regular  Vitals Entered By: Raechel Ache, RN (Apr 18, 2010 11:23 AM) CC: C/o redness and swelling R foot since Sunday, no injury.   History of Present Illness: Here for the sudden onset of redness, swelling, and pain in the right lateral ankle and over the dorsal foot 3 days ago. No trauma. No calf or leg pain. She has never had this before. This past weekend she spent a day at the beach and she ate mussels and shrimp. She generally does not eat shellfish. She had only one alcoholic drink at the time.   Allergies: 1)  ! Morphine 2)  ! Codeine  Past History:  Past Medical History: Reviewed history from 09/13/2009 and no changes required. DVT, hx of, has had 2 in the right calf and one in the left calf. Now on lifetime anticoagulation.  hypercoagulability workup was negative per Dr. Granfortuna glaucoma, sees Dr. Digby Hyperlipidemia Hypertension Anemia-NOS  Review of Systems  The patient denies anorexia, fever, weight loss, weight gain, vision loss, decreased hearing, hoarseness, chest pain, syncope, dyspnea on exertion, peripheral edema, prolonged cough, headaches, hemoptysis, abdominal pain, melena, hematochezia, severe indigestion/heartburn, hematuria, incontinence, genital sores, muscle weakness, suspicious skin lesions, transient blindness, difficulty walking, depression, unusual weight change, abnormal bleeding, enlarged lymph nodes, angioedema, breast masses, and testicular masses.    Physical Exam  General:  Well-developed,well-nourished,in no acute distress; alert,appropriate and cooperative throughout examination Extremities:  the right lateral ankle is swollen and tender.  The dorsal right foot is mildly swollen and tender, and the dorsal and lateral  foot has diffuse erythema. The skin is not warm. No skin breaks are seen. No streaks up the leg. No calf tenderness, no cords, and Homans is negative.    Impression & Recommendations:  Problem # 1:  FOOT PAIN (ICD-729.5)  Complete Medication List: 1)  Coumadin 5 Mg Tabs (Warfarin sodium) .... As directed 2)  Hydrochlorothiazide 25 Mg Tabs (Hydrochlorothiazide) .... One by mouth daily 3)  Simvastatin 20 Mg Tabs (Simvastatin) .... One by mouth daily 4)  Caltrate 600+d 600-400 Mg-unit Tabs (Calcium carbonate-vitamin d) .... One tablet by mouth once daily 5)  Tramadol Hcl 50 Mg Tabs (Tramadol hcl) .... 1 q 4 hours as needed pain 6)  Chelated Potassium 99 Mg Tabs (Potassium) .... 1 once daily 7)  Prednisone (pak) 10 Mg Tabs (Prednisone) .... As directed for 6 days  Patient Instructions: 1)  This is quite possibly her first gout episode, and her consumption of shellfish several days ago was a likely trigger. Treat with a steroid dose pack. Stay off the foot and keep it elevated. recheck next week when she is due for labs. Will get a uric acid level at that time Prescriptions: PREDNISONE (PAK) 10 MG TABS (PREDNISONE) as directed for 6 days  #1 x 0   Entered and Authorized by:   Stephen A Fry MD   Signed by:   Stephen A Fry MD on 04/18/2010   Method used:   Electronically to        [facility] [address] # [id number]* (retail)       [address]       [place], [place]  [address]       Ph: [phone]       Fax: [phone]   RxID:   [id number]

## 2010-12-26 NOTE — Assessment & Plan Note (Signed)
Summary: b12 inj/njr  Nurse Visit   Allergies: 1)  ! Morphine 2)  ! Codeine  Medication Administration  Injection # 1:    Medication: Vit B12 1000 mcg    Diagnosis: VITAMIN B12 DEFICIENCY (ICD-266.2)    Route: IM    Site: R deltoid    Exp Date: 9/12    Lot #: [id number]    Mfr: [employer]    Patient tolerated injection without complications    Given by: Alfred Levins, CMA (January 02, 2010 3:23 PM)  Orders Added: 1)  Vit B12 1000 mcg [J3420] 2)  Admin of Therapeutic Inj  intramuscular or subcutaneous [81191]

## 2010-12-26 NOTE — Progress Notes (Signed)
Summary: request for lab  b12   Phone Note Call from Patient Call back at lab work   Summary of Call: good idea. Add a B12 for 266.2  Initial call taken by: [name] MD,  March 03, 2010 1:36 PM  Follow-up for Phone Call        spoke with pt - ok to do b12 - to be done before b12 injection KIK Follow-up by: Duard Brady LPN,  March 03, 2010 3:40 PM    Additional Follow-up for Phone Call Additional follow up Details #2::    pt has coumadin check next week and was inquiring about lab being done at the same time for B12? has rov in 2 weeks with you. KIK Follow-up by: Duard Brady LPN,  March 02, 2010 10:18 AM

## 2010-12-26 NOTE — Assessment & Plan Note (Signed)
Summary: b12 inj/pt/njr pt rsc/njr  Nurse Visit   Allergies: 1)  ! Morphine 2)  ! Codeine Laboratory Results   Blood Tests   Date/Time Received: January 12, 2010 10:11 AM  Date/Time Reported: January 12, 2010 10:11 AM   PT: 18.2 s   (Normal Range: 10.6-13.4)  INR: 2.2   (Normal Range: 0.88-1.12   Therap INR: 2.0-3.5) Comments: Wynona Canes, CMA  January 12, 2010 10:11 AM     Medication Administration  Injection # 1:    Medication: Vit B12 1000 mcg    Diagnosis: VITAMIN B12 DEFICIENCY (ICD-266.2)    Route: IM    Site: L deltoid    Exp Date: 9/12    Lot #: [id number]    Mfr: [employer]    Patient tolerated injection without complications    Given by: Alfred Levins, CMA (January 12, 2010 10:17 AM)  Orders Added: 1)  Protime [09811BJ] 2)  Vit B12 1000 mcg [J3420] 3)  Admin of Therapeutic Inj  intramuscular or subcutaneous [96372]  Laboratory Results   Blood Tests     PT: 18.2 s   (Normal Range: 10.6-13.4)  INR: 2.2   (Normal Range: 0.88-1.12   Therap INR: 2.0-3.5) Comments: Wynona Canes, CMA  January 12, 2010 10:11 AM       ANTICOAGULATION RECORD PREVIOUS REGIMEN & LAB RESULTS   Previous INR:  3.1 on  12/26/2009  Previous Regimen:  5mg . Wed. Sat. and Sun. all others 7.5mg . on  12/26/2009 Previous Coagulation Comments:  Pt. will d/c 10/21/09 due to surgery, will resume dosage after and F/U 2 wks.  on  10/18/2009  NEW REGIMEN & LAB RESULTS Anticoag. Dx: Y78.29,562.13 Current INR Goal Range: 2.0-3.0 Current INR: 2.2 Current Coumadin Dose(mg): 5mg  on w,sat,sun & 7.5mg  other days Regimen: 5mg . Wed. Sat. and Sun. all others 7.5mg .  (no change)  Provider: Dr.Rayanna Matusik MEDICATIONS COUMADIN 5 MG TABS (WARFARIN SODIUM) as directed HYDROCHLOROTHIAZIDE 25 MG TABS (HYDROCHLOROTHIAZIDE) one by mouth daily SIMVASTATIN 20 MG TABS (SIMVASTATIN) one by mouth daily CALTRATE 600+D 600-400 MG-UNIT TABS (CALCIUM CARBONATE-VITAMIN D) one tablet by mouth once  daily TRAMADOL HCL 50 MG TABS (TRAMADOL HCL) 1 q 4 hours as needed pain   Anticoagulation Visit Questionnaire      Coumadin dose missed/changed:  No      Abnormal Bleeding Symptoms:  No   Any diet changes including alcohol intake, vegetables or greens since the last visit:  No Any illnesses or hospitalizations since the last visit:  No Any signs of clotting since the last visit (including chest discomfort, dizziness, shortness of breath, arm tingling, slurred speech, swelling or redness in leg):  No

## 2010-12-26 NOTE — Assessment & Plan Note (Signed)
Summary: PT AND VIT B-12 INJ/CJR  Nurse Visit   Allergies: 1)  ! Morphine 2)  ! Codeine  Medication Administration  Injection # 1:    Medication: Vit B12 1000 mcg    Diagnosis: VITAMIN B12 DEFICIENCY (ICD-266.2)    Route: IM    Site: L deltoid    Exp Date: 4/13    Lot #: [id number]    Mfr: [employer]    Patient tolerated injection without complications    Given by: Raechel Ache, RN (July 18, 2010 11:04 AM)  Orders Added: 1)  Vit B12 1000 mcg [J3420] 2)  Admin of Therapeutic Inj  intramuscular or subcutaneous [96372]  Appended Document: Orders Update    Clinical Lists Changes  Problems: Added new problem of ENCOUNTER FOR THERAPEUTIC DRUG MONITORING (ICD-V58.83) Orders: Added new Service order of Est. Patient Level I (45409) - Signed Added new Service order of Fingerstick [phone]) - Signed Observations: Added new observation of BLEEDNGSIGNS: Bruises on legs  (07/18/2010 11:13) Added new observation of ABNORM BLEED: Yes (07/18/2010 11:13) Added new observation of COUMADIN CHG: No (07/18/2010 11:13) Added new observation of NEXT PT: 4 weeks (07/18/2010 11:13) Added new observation of COMMENTS2: Wynona Canes, CMA  July 18, 2010 11:13 AM  (07/18/2010 11:13) Added new observation of INR: 2.2  (07/18/2010 11:13)      Laboratory Results   Blood Tests   Date/Time Recieved: July 18, 2010 11:13 AM  Date/Time Reported: July 18, 2010 11:13 AM    INR: 2.2   (Normal Range: 0.88-1.12   Therap INR: 2.0-3.5) Comments: Wynona Canes, CMA  July 18, 2010 11:13 AM       ANTICOAGULATION RECORD PREVIOUS REGIMEN & LAB RESULTS Anticoagulation Diagnosis:  v58.83,453.41 on  01/12/2010 Previous INR Goal Range:  2.0-3.0 on  01/12/2010 Previous INR:  3.1 on  05/18/2010 Previous Coumadin Dose(mg):  7.5mg  qd on  05/18/2010 Previous Regimen:  7.5mg . QD on  04/05/2010 Previous Coagulation Comments:  Pt. will d/c 10/21/09 due to surgery, will resume  dosage after and F/U 2 wks.  on  10/18/2009  NEW REGIMEN & LAB RESULTS Current INR: 2.2 Regimen: 7.5mg . QD  (no change)       Repeat testing in: 4 weeks MEDICATIONS COUMADIN 5 MG TABS (WARFARIN SODIUM) as directed HYDROCHLOROTHIAZIDE 25 MG TABS (HYDROCHLOROTHIAZIDE) one by mouth daily SIMVASTATIN 20 MG TABS (SIMVASTATIN) one by mouth daily CALTRATE 600+D 600-400 MG-UNIT TABS (CALCIUM CARBONATE-VITAMIN D) one tablet by mouth once daily TRAMADOL HCL 50 MG TABS (TRAMADOL HCL) 1 q 4 hours as needed pain CHELATED POTASSIUM 99 MG TABS (POTASSIUM) 1 once daily   Anticoagulation Visit Questionnaire      Coumadin dose missed/changed:  No      Abnormal Bleeding Symptoms:  Yes   Any diet changes including alcohol intake, vegetables or greens since the last visit:  No Any illnesses or hospitalizations since the last visit:  No Any signs of clotting since the last visit (including chest discomfort, dizziness, shortness of breath, arm tingling, slurred speech, swelling or redness in leg):  No

## 2010-12-28 ENCOUNTER — Telehealth: Payer: Self-pay | Admitting: *Deleted

## 2010-12-28 NOTE — Assessment & Plan Note (Signed)
Summary: fu on inf finger/njr   Vital Signs:  Patient profile:   65 year old female O2 Sat:      96 % on Room air Temp:     97.9 degrees F Pulse rate:   77 / minute BP sitting:   110 / 70  (left arm) Cuff size:   regular  Vitals Entered By: Pura Spice, RN (December 21, 2010 11:14 AM)  O2 Flow:  Room air CC: reck rh tip of thumb states rt arm painful   History of Present Illness: Here to follow up on sharp pains in the right thumb and forearm. She took the antibiotics but is no better. In fact, now she also has sharp pains in the anterior right shoulder which radiate down into the upper arm. The hand sometimes goes numb. No warmth or redness or edema.   Allergies: 1)  ! Morphine 2)  ! Codeine  Past History:  Past Medical History: Reviewed history from 09/13/2009 and no changes required. DVT, hx of, has had 2 in the right calf and one in the left calf. Now on lifetime anticoagulation.  hypercoagulability workup was negative per Dr. Cyndie Chime glaucoma, sees Dr. Hazle Quant Hyperlipidemia Hypertension Anemia-NOS  Past Surgical History: Reviewed history from 12/26/2009 and no changes required. Appendectomy Cholecystectomy Tonsillectomy right knee arthroscopy per Dr. Chaney Malling 2005 Lumbar laminectomy per Dr. Newell Coral, 2009 colonoscopy 11-28-09 per Dr. Arlyce Dice, normal, repeat in 10 yrs  Review of Systems  The patient denies anorexia, fever, weight loss, weight gain, vision loss, decreased hearing, hoarseness, chest pain, syncope, dyspnea on exertion, peripheral edema, prolonged cough, headaches, hemoptysis, abdominal pain, melena, hematochezia, severe indigestion/heartburn, hematuria, incontinence, genital sores, muscle weakness, suspicious skin lesions, transient blindness, difficulty walking, depression, unusual weight change, abnormal bleeding, enlarged lymph nodes, angioedema, breast masses, and testicular masses.    Physical Exam  General:  Well-developed,well-nourished,in  no acute distress; alert,appropriate and cooperative throughout examination Msk:  very tender over the right anterior shoulder in the subacromial area. Adduction is painful, abduction is not.  Extremities:  the right hand is clear except for tenderness over the tip of the thumb    Impression & Recommendations:  Problem # 1:  SHOULDER PAIN (ICD-719.41)  Her updated medication list for this problem includes:    Tramadol Hcl 50 Mg Tabs (Tramadol hcl) .Marland Kitchen... 1 q 4 hours as needed pain  Orders: Orthopedic Referral (Ortho)  Complete Medication List: 1)  Coumadin 5 Mg Tabs (Warfarin sodium) .... As directed 2)  Hydrochlorothiazide 25 Mg Tabs (Hydrochlorothiazide) .... One by mouth daily 3)  Simvastatin 20 Mg Tabs (Simvastatin) .... One by mouth daily 4)  Caltrate 600+d 600-400 Mg-unit Tabs (Calcium carbonate-vitamin d) .... One tablet by mouth once daily 5)  Tramadol Hcl 50 Mg Tabs (Tramadol hcl) .Marland Kitchen.. 1 q 4 hours as needed pain 6)  Chelated Potassium 99 Mg Tabs (Potassium) .Marland Kitchen.. 1 once daily  Patient Instructions: 1)  This is consistent with impingement syndrome. Use ice packs and rest. Refer to Orthopedics.    Orders Added: 1)  Est. Patient Level IV [04540] 2)  Orthopedic Referral [Ortho]

## 2010-12-28 NOTE — Telephone Encounter (Signed)
Pt cannot come into the office.

## 2010-12-28 NOTE — Assessment & Plan Note (Signed)
Summary: pt lab/b12 inj//ccm//INJURED FINGER//ALP   Vital Signs:  Patient profile:   65 year old female Weight:      146 pounds O2 Sat:      93 % Temp:     98.1 degrees F Pulse rate:   74 / minute BP sitting:   110 / 80  (left arm) Cuff size:   regular  Vitals Entered By: Pura Spice, RN (December 05, 2010 11:31 AM) CC: ck rt thumb thinks infected states "feeling pain going up rt arm,".   ck coumadin level and wants b12 inj    History of Present Illness: here for 2 reasons. First she had not had her INR checked since last June. She admits to taking her Coumadin sporadically. her son has been in [facility] getting treatment for  Tcell leukemia, and this has taken all of her focus for the past few months. Fortunately he is doing well after a stem cell transplant. Also one week ago she developed pain at the right thumb with no known trauma.   Allergies: 1)  ! Morphine 2)  ! Codeine  Past History:  Past Medical History: Reviewed history from 09/13/2009 and no changes required. DVT, hx of, has had 2 in the right calf and one in the left calf. Now on lifetime anticoagulation.  hypercoagulability workup was negative per Dr. Cyndie Chime glaucoma, sees Dr. Hazle Quant Hyperlipidemia Hypertension Anemia-NOS  Past Surgical History: Reviewed history from 12/26/2009 and no changes required. Appendectomy Cholecystectomy Tonsillectomy right knee arthroscopy per Dr. Chaney Malling 2005 Lumbar laminectomy per Dr. Newell Coral, 2009 colonoscopy 11-28-09 per Dr. Arlyce Dice, normal, repeat in 10 yrs  Review of Systems  The patient denies anorexia, fever, weight loss, weight gain, vision loss, decreased hearing, hoarseness, chest pain, syncope, dyspnea on exertion, peripheral edema, prolonged cough, headaches, hemoptysis, abdominal pain, melena, hematochezia, severe indigestion/heartburn, hematuria, incontinence, genital sores, muscle weakness, suspicious skin lesions, transient blindness, difficulty walking,  depression, unusual weight change, abnormal bleeding, enlarged lymph nodes, angioedema, breast masses, and testicular masses.    Physical Exam  General:  Well-developed,well-nourished,in no acute distress; alert,appropriate and cooperative throughout examination Lungs:  Normal respiratory effort, chest expands symmetrically. Lungs are clear to auscultation, no crackles or wheezes. Heart:  Normal rate and regular rhythm. S1 and S2 normal without gallop, murmur, click, rub or other extra sounds. Extremities:  the right thumb is swollen and tender under the nail   Impression & Recommendations:  Problem # 1:  PARONYCHIA (ICD-681.9)  Her updated medication list for this problem includes:    Keflex 500 Mg Caps (Cephalexin) .Marland Kitchen... 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 As directed  Complete Medication List: 1)  Coumadin 5 Mg Tabs (Warfarin sodium) .... As directed 2)  Hydrochlorothiazide 25 Mg Tabs (Hydrochlorothiazide) .... One by mouth daily 3)  Simvastatin 20 Mg Tabs (Simvastatin) .... One by mouth daily 4)  Caltrate 600+d 600-400 Mg-unit Tabs (Calcium carbonate-vitamin d) .... One tablet by mouth once daily 5)  Tramadol Hcl 50 Mg Tabs (Tramadol hcl) .Marland Kitchen.. 1 q 4 hours as needed pain 6)  Chelated Potassium 99 Mg Tabs (Potassium) .Marland Kitchen.. 1 once daily 7)  Keflex 500 Mg Caps (Cephalexin) .... Three times a day  Other Orders: Vit B12 1000 mcg (J3420) Admin of Therapeutic Inj  intramuscular or subcutaneous ([address])  Patient Instructions:  1)  Urged her to take her Coumadin regularly, and she said she will now that they are back in [place].  2)  Please schedule a follow-up appointment in 1 month.  Prescriptions: KEFLEX 500 MG CAPS (CEPHALEXIN) three times a day  #30 x 0   Entered and Authorized by:   [name]  MD   Signed by:   [name] MD on 12/05/2010   Method used:   Electronically to        [facility] # [phone]* (retail)       [address]       [place], [place]  [address]       Ph: [phone]       Fax: [phone]   RxID:   [id number]    Medication Administration  Injection # 1:    Medication: Vit B12 1000 mcg    Diagnosis: VITAMIN B12 DEFICIENCY (ICD-266.2)    Route: IM    Site: L deltoid    Exp Date: 06/2012    Lot #: [id number]    Mfr: [employer]    Patient tolerated injection without complications    Given by: Pura Spice, RN (December 05, 2010 11:36 AM)  Orders Added: 1)  Vit B12 1000 mcg [J3420] 2)  Admin of Therapeutic Inj  intramuscular or subcutaneous [96372] 3)  Est. Patient Level IV [25366]

## 2010-12-28 NOTE — Assessment & Plan Note (Signed)
Summary: pt lab/b12 inj//ccm  Nurse Visit   Allergies: 1)  ! Morphine 2)  ! Codeine Laboratory Results   Blood Tests      INR: 1.7   (Normal Range: 0.88-1.12   Therap INR: 2.0-3.5) Comments: Rita Ohara  December 05, 2010 11:12 AM     Orders Added: 1)  Est. Patient Level I [99211] 2)  Protime [04540JW]   ANTICOAGULATION RECORD PREVIOUS REGIMEN & LAB RESULTS Anticoagulation Diagnosis:  v58.83,453.41 on  01/12/2010 Previous INR Goal Range:  2.0-3.0 on  01/12/2010 Previous INR:  2.2 on  07/18/2010 Previous Coumadin Dose(mg):  7.5mg  qd on  05/18/2010 Previous Regimen:  7.5mg . QD on  04/05/2010 Previous Coagulation Comments:  Pt. will d/c 10/21/09 due to surgery, will resume dosage after and F/U 2 wks.  on  10/18/2009  NEW REGIMEN & LAB RESULTS Current INR: 1.7 Regimen: same  Repeat testing in: 4 weeks  Anticoagulation Visit Questionnaire Coumadin dose missed/changed:  Yes Coumadin Dose Comments:  one or more missed dose(s) Abnormal Bleeding Symptoms:  No  Any diet changes including alcohol intake, vegetables or greens since the last visit:  No Any illnesses or hospitalizations since the last visit:  No Any signs of clotting since the last visit (including chest discomfort, dizziness, shortness of breath, arm tingling, slurred speech, swelling or redness in leg):  No  MEDICATIONS COUMADIN 5 MG TABS (WARFARIN SODIUM) as directed HYDROCHLOROTHIAZIDE 25 MG TABS (HYDROCHLOROTHIAZIDE) one by mouth daily SIMVASTATIN 20 MG TABS (SIMVASTATIN) one by mouth daily CALTRATE 600+D 600-400 MG-UNIT TABS (CALCIUM CARBONATE-VITAMIN D) one tablet by mouth once daily TRAMADOL HCL 50 MG TABS (TRAMADOL HCL) 1 q 4 hours as needed pain CHELATED POTASSIUM 99 MG TABS (POTASSIUM) 1 once daily

## 2010-12-28 NOTE — Telephone Encounter (Signed)
Pt developed sore throat and fever x 2 days.  3 Grandchildren she keeps has positive strep. Needs RX

## 2010-12-28 NOTE — Telephone Encounter (Signed)
Bring her in for evaluation tomorrow morning.

## 2010-12-29 MED ORDER — CEPHALEXIN 500 MG PO CAPS: 500.0000 mg | ORAL_CAPSULE | Freq: Two times a day (BID) | ORAL | Status: AC

## 2010-12-29 NOTE — Telephone Encounter (Signed)
Pt cannot come to the office until she has someone to keep her grandchilden?

## 2010-12-29 NOTE — Telephone Encounter (Signed)
Pt aware.

## 2010-12-29 NOTE — Telephone Encounter (Signed)
Addended by: Madison Hickman on: 12/29/2010 06:20 PM   Modules accepted: Orders

## 2010-12-29 NOTE — Telephone Encounter (Signed)
Call in Keflex 500 mg bid for 10 days  

## 2011-01-03 ENCOUNTER — Other Ambulatory Visit: Payer: Self-pay | Admitting: Family Medicine

## 2011-01-03 DIAGNOSIS — E785 Hyperlipidemia, unspecified: Secondary | ICD-10-CM

## 2011-01-03 DIAGNOSIS — I10 Essential (primary) hypertension: Secondary | ICD-10-CM

## 2011-03-12 ENCOUNTER — Other Ambulatory Visit: Payer: Self-pay | Admitting: Family Medicine

## 2011-04-10 NOTE — H&P (Signed)
[name], [name] NO.:  [id number]   MEDICAL RECORD NO.:  [id number]          PATIENT TYPE:  EMS   LOCATION:  MINO                         FACILITY:  [facility]   PHYSICIAN:  Hollice Espy, M.D.DATE OF BIRTH:  07/18/1946   DATE OF ADMISSION:  08/03/2008  DATE OF DISCHARGE:                              HISTORY & PHYSICAL   PRIMARY CARE PHYSICIAN:  Jethro Bastos, M.D.   CHIEF COMPLAINT:  Leg pain.   HISTORY OF PRESENT ILLNESS:  The patient is a 65 year old Hispanic  female with past medical history of coagulation disorder which has led  to chronic DVTs and therefore she is on chronic anticoagulation plus  hypertension and hyperlipidemia.  She has noted complaints of some left  leg pain around the knee area for the last several weeks, but then she  may says that she missed a Coumadin dose on Sunday, August 01, 2008.  She then, today, was turning her leg when she walked and had severe  wrenching and popping.  She became concerned and began to have severe  pain where she could not stand and bear weight on that leg.  She came to  the emergency room for evaluation.  A 4 view knee film was done which  was unremarkable, showing no signs of any fracture.  Labs were done on  the patient.  She was found to have normal labs except for an INR which  was subtherapeutic at 1.8.  The patient then had a left lower Doppler  done, which showed a preliminary finding of a newly formed acute DVT in  the popliteal vein.  With these findings and subtherapeutic INR, it was  felt best that the patient come in for further evaluation and treatment.  Currently, the patient is doing well.  She says her leg did not hurt as  long as she does not move it.  When she does she has severe pain in the  knee area, on the medial aspect.  She denies any headaches, dyspnea,  dysphagia, chest pain, palpitations, shortness of breath, wheeze, cough,  no abdominal pain, no hematuria, dysuria,  constipation, diarrhea, focal  extremity numbness, weakness or pain, other than described above.   REVIEW OF SYSTEMS:  Otherwise negative.   PAST MEDICAL HISTORY:  Includes believed to have a clotting disorder  leading to chronic DVT requiring chronic anticoagulation, history of  hyperlipidemia, history of hypertension and tobacco abuse.   MEDICATIONS:  She is on hydrochlorothiazide, simvastatin and Coumadin  7.5 p.o. nightly, occasionally having to go on 10 to increase her INR.   ALLERGIES:  SHE HAS ALLERGIES TO CODEINE, MORPHINE AND VICODIN   SOCIAL HISTORY:  She smokes about a half pack to a pack a day.  Denies  any heavy alcohol or drug use.   FAMILY HISTORY:  Noncontributory.   PHYSICAL EXAMINATION:  VITAL SIGNS:  On admission, temperature 97, heart  rate 79, blood pressure 112/79, respirations 20, O2 sat 100% on room  air.  GENERAL:  She is alert and oriented x3, in no apparent distress.  HEENT:  Normocephalic atraumatic.  Mucous membranes are moist.  She has  no carotid bruits.  HEART:  Regular rate and rhythm, S1 and S2  LUNGS:  Clear to auscultation bilaterally.  ABDOMEN:  Soft, nontender, nondistended.  Positive bowel sounds.  EXTREMITIES:  Her left knee shows signs of swelling.  Trace pitting  edema and tenderness and pain with left leg extension.   LAB WORK:  White count 7.2, H and H 14.7 and 44, MCV of 92, platelet  count 247, no shift.  INR subtherapeutic at 1.8.  Sodium 138, potassium  3.2, chloride 101, BUN 9, creatinine 1, glucose 89.   Knee x-ray and Dopplers are as per HPI.   ASSESSMENT AND PLAN:  1. Acute deep venous thrombosis in patient with:  2. Clotting disorder.  Questionable subtherapeutic on Coumadin or was      she therapeutic on Coumadin and still developed a deep venous      thrombosis.  Nevertheless, will treat with Lovenox and Coumadin.      Discussed with pharmacy as to length of time for crossover.  Keep      for 24 hour observation and  once INR is therapeutic, will discharge      home on Coumadin plus or minus Lovenox/  3. Wrenched knee.  Will get physical therapy and occupational therapy      to evaluate.  Possibly may need crutches.  4. Hypertension.  5. Hypokalemia.  Will replace.  6. Hyperlipidemia.      Hollice Espy, M.D.  Electronically Signed     SKK/MEDQ  D:  08/03/2008  T:  08/03/2008  Job:  [id number]   cc:   Jethro Bastos, M.D.

## 2011-04-13 NOTE — H&P (Signed)
Lydia, [name] NO.:  [id number]   MEDICAL RECORD NO.:  [id number]                   PATIENT TYPE:  INP   LOCATION:  1825                                 FACILITY:  [facility]   PHYSICIAN:  Sherin Quarry, MD                   DATE OF BIRTH:  1946/03/19   DATE OF ADMISSION:  12/10/2003  DATE OF DISCHARGE:                                HISTORY & PHYSICAL   HISTORY:  on December 02, 2003, Ms. Herber reports that she had arthroscopy  of the right knee with the finding of a cartilage tear.  She was discharged  the same day.  Over the next several days, she seemed to be doing well, but  she noted that her right calf felt cold, and also that the pain seemed to be  increasing rather than decreasing.  She noticed swelling of the proximal  calf.  She reported these findings to Dr. Lenard Galloway. Mortenson's partner  today and was sent for a venous Doppler.  This study revealed a deep vein  thrombosis of the peroneal veins of the mid to proximal calf.  The popliteal  vein up through the common femoral vein was thrombus free.  The patient was  therefore advised to come to the emergency room and is admitted at this time  for treatment of DVT.  She denies fever, chest pain, shortness of breath or  gastrointestinal symptoms.   PAST MEDICAL HISTORY:  Her medications are only p.r.n. Advil.  She also take  Timoptic.   ALLERGIES:  CODEINE, MORPHINE AND ALL PAIN MEDICATIONS.   OPERATIONS:  She has had a previous cholecystectomy and appendectomy.   ILLNESSES:  None.   FAMILY HISTORY:  The patient's mother has had a total hip replacement.  She  has a history of hypertension and atrial fibrillation.  Father has a history  of Hodgkin's disease.  Her sister has a history of multiple sclerosis and  has also had a previous hip fracture.   SOCIAL HISTORY:  The patient smokes one-half pack of cigarettes per day.  She denies alcohol or drug abuse.   REVIEW OF SYSTEMS:  Head:   She denies headache or dizziness.  Eyes:  She  denies visual blurring or diplopia.  Ears, nose and throat:  She denies  earache, sinus pain or sore throat.  Chest:  She denies coughing, wheezing,  chest congestion or chest pain.  Cardiovascular:  She denies orthopnea, PND  or ankle edema.  GI:  She denies nausea, vomiting, abdominal pain, change in  bowel habits, melena or hematochezia.  GU:  She denies dysuria or urinary  frequency.  GYN:  She has had no irregular menses or unusual vaginal  discharge.  Neurologic:  There is no history of seizure or stroke.  Endocrine:  She denies excessive thirst or urinary frequency.   PHYSICAL EXAMINATION:  HEENT:  Exam is within normal limits.  CHEST:  Clear to auscultation and percussion.  BACK:  Examination of the back reveals no CVA tenderness.  CARDIOVASCULAR:  Exam reveals normal S1, S2 without rubs, murmurs or  gallops.  ABDOMEN:  Benign.  There are normal bowel sounds.  There are no masses or  tenderness, no guarding or rebound.  NEUROLOGIC: Testing is within normal limits.  EXTREMITIES:  BP and PT pulses are 2+.  There is no cyanosis.  The proximal  right calf is visibly swollen.  There is no evidence of lymphangitis.  The  thigh appears fairly normal.   IMPRESSION:  1. Deep vein thrombosis of peroneal veins of right proximal calf.  2. Recent arthroscopy right knee.  3. History of cholecystectomy.  4. Multiple allergies.  5. Glaucoma.   PLAN:  1. The patient will be admitted at this time for initiation of Lovenox and     Coumadin therapy.  2. We will initiate investigation of whether her insurance company will     cover outpatient Lovenox.  3. We will plan to give her three to six months of Coumadin therapy which     will be arranged through Dr. Malon Kindle office.                                                Sherin Quarry, MD    SY/MEDQ  D:  12/10/2003  T:  12/10/2003  Job:  [id number]   cc:   Jethro Bastos, M.D.  [address]  [place]  [place] [address]  Fax: [phone]   Lenard Galloway. Chaney Malling, M.D.  [address] [place]  [place] [address]  Fax: [phone]

## 2011-04-13 NOTE — Discharge Summary (Signed)
[name], [name]                      ACCOUNT NO.:  [id number]   MEDICAL RECORD NO.:  [id number]                   PATIENT TYPE:  INP   LOCATION:  5031                                 FACILITY:  [facility]   PHYSICIAN:  Lydia L. Lendell Caprice, MD             DATE OF BIRTH:  Jun 09, 1946   DATE OF ADMISSION:  12/10/2003  DATE OF DISCHARGE:  12/13/2003                                 DISCHARGE SUMMARY   DISCHARGE DIAGNOSES:  1. Right lower extremity deep venous thrombosis.  2. Recent right knee arthroscopy.   DISCHARGE MEDICATIONS:  1. Lovenox 100 mg subcutaneously daily until INR is greater than or equal to     2.0.  2. Coumadin 5 mg p.o. q.h.s.  She is to follow up with Jethro Bastos,     M.D. in one week for Coumadin management.  She is to get another INR     drawn on Friday which is four days from now to be called to Dr. Dorothe Pea.   ACTIVITY:  No heavy exertion for a week.   DIET:  Coumadin friendly.   CONDITION ON DISCHARGE:  Stable.   HISTORY OF PRESENT ILLNESS:  The patient is a pleasant 65 year old white  female who had a recent right knee arthroscopy.  She was developing more  swelling and pain in her leg and was sent to the vascular lab by her  orthopedic surgeon.  She was found to have a right leg DVT and was admitted  to the [facility] for management of this.   HOSPITAL COURSE:  She was started on Lovenox and Coumadin.  The day of  admission, she was unable to bear weight and almost fell when her knee  buckled.  It was felt therefore prudent to admit the patient to the  hospital for bed rest and subsequent ambulation with assistance once off bed  rest to ensure that she would be safe to home with home Lovenox therapy as  her husband works during the week.  At the time of admission, her INR was 1,  at  discharge it was 1.7.  I have written her two more doses of Lovenox, but she  may only need another day's worth.  She was able to ambulate with a walker  and can be discharged to home safely. She has been set up with home health  to assist with the daily Lovenox injections which is 1.5 mg/kg and daily INR  draws.                                                Lydia L. Lendell Caprice, MD    CLS/MEDQ  D:  12/13/2003  T:  12/14/2003  Job:  [id number]   cc:   Jethro Bastos, M.D.  [address]  [place]  [place] [address]  Fax: [phone]

## 2011-04-13 NOTE — Op Note (Signed)
NAMEIVANIA, [name]            ACCOUNT NO.:  [id number]   MEDICAL RECORD NO.:  [id number]          PATIENT TYPE:  AMB   LOCATION:  SDS                          FACILITY:  [facility]   PHYSICIAN:  Hewitt Shorts, M.D.DATE OF BIRTH:  1946/01/01   DATE OF PROCEDURE:  01/06/2007  DATE OF DISCHARGE:                               OPERATIVE REPORT   PREOPERATIVE DIAGNOSES:  Right L4-5 lumbar disk degeneration, lumbar  degenerative disk disease, lumbar spondylosis and lumbar radiculopathy.   POSTOPERATIVE DIAGNOSES:  Right L4-5 lumbar disk degeneration, lumbar  degenerative disk disease, lumbar spondylosis and lumbar radiculopathy.   PROCEDURE:  Right L4-5 lumbar laminotomy and microdiskectomy with  microdissection.   SURGEON:  Hewitt Shorts, M.D.   ASSISTANTS:  Cristi Loron, M.D.  Russell L. Webb Silversmith, R.N.   ANESTHESIA:  General endotracheal.   INDICATIONS:  The patient is a 65 year old woman, who presented with  right lumbar radiculopathy, and was found by MRI scan to have a  significant broad-based disk herniation at the L4-5 level.  It was  somewhat worse to the left than to the right side; however, there was a  free fragment of disk herniation on the right side, which was felt to be  the cause of her acute radiculopathy, and a decision was made to proceed  with laminotomy and microdiskectomy.   DESCRIPTION OF PROCEDURE:  The patient was brought to the operating  room, placed under general endotracheal anesthesia.  The patient was  turned to a prone position and the lumbar region was prepped with  Betadine Soap and Solution and draped in a sterile fashion.  The midline  was infiltrated with local anesthetic with epinephrine, an x-ray was  taken, and the L4-5 level identified.  A midline incision was made and  carried down through the subcutaneous tissue.  Bipolar cautery and  electrocautery were used to maintain hemostasis.  Dissection was carried  down to the lumbar  fascia, which was incised on the right side of the  midline, and the paraspinal muscles were dissected from the spinous  processes and laminae in a subperiosteal fashion.  A self-retaining  retractor was placed and an x-ray taken and the L4-5 interlaminar space  identified.  The microscope was draped and brought into the field to  provide additional magnification, illumination and visualization, and  the remainder of the decompression was performed using microdissection  and microsurgical technique.  Laminotomy was performed using the X-Max  drill and Kerrison punches.  Ligamentum flavum was carefully removed and  we identified the thecal sac and exiting right L5 nerve root.  These  structures were gently retracted medially and we identified the free  fragment of disk herniation protruding through a hole in the annulus.  This fragment was removed, and then we incised the annulus to open the  exposure further and entered into the disk space and proceeded with a  thorough diskectomy using a variety of microcurets and pituitary  rongeurs.  A thorough diskectomy was performed with removal of all loose  fragments of disk material from both the disk space and the epidural  space, and  in the end, good decompression of the thecal sac and nerve  root was achieved.  Once the decompression was completed, hemostasis was  established with the use of bipolar cautery.  And then, once hemostasis  was confirmed, we instilled 2 mL of fentanyl and 80 mg of Depo-Medrol,  and then proceeded with closure.  The deep fascia was closed with  interrupted, undyed 1 Vicryl sutures.  The subcutaneous and subcuticular  layers were closed with interrupted, inverted 2-0 undyed Vicryl sutures,  and the skin edges were approximated with Dermabond.  The procedure was  tolerated well.  The estimated blood loss was less than 25 mL.  Sponge  count was correct.  Following surgery, the patient was turned back to  the supine  position to be reversed from the anesthetic, extubated and  transferred to the recovery room for further care.      Hewitt Shorts, M.D.  Electronically Signed     RWN/MEDQ  D:  01/06/2007  T:  01/06/2007  Job:  [id number]

## 2011-04-13 NOTE — H&P (Signed)
[name], [name]            ACCOUNT NO.:  [id number]   MEDICAL RECORD NO.:  [id number]          PATIENT TYPE:  INP   LOCATION:  4714                         FACILITY:  [facility]   PHYSICIAN:  Kela Millin, M.D.DATE OF BIRTH:  1946-03-05   DATE OF ADMISSION:  05/28/2006  DATE OF DISCHARGE:                                HISTORY & PHYSICAL   PRIMARY CARE PHYSICIAN:  Dr. Dorothe Pea.   CHIEF COMPLAINT:  Right calf pain.   HISTORY OF PRESENT ILLNESS:  The patient is a 65 year old female with past  medical history significant for a DVT about 3 years ago and that was status  post right knee arthroscopy at that time, tobacco abuse, who presents with  above complaints.  She states that she was in her usual state of health  until a few weeks ago, when she first noted that her toes looked dusky, more  so on the right, and felt cool to her.  She recently began experiencing  right calf pain and so she was sent to the vascular lab by her PCP and thus  an ultrasound showed a DVT and she was admitted to the [facility] for further management.  She denies chest pain, cough, hemoptysis,  shortness of breath, fevers, dysuria, melena and no hematochezia.   PAST MEDICAL HISTORY:  As stated above.   MEDICATIONS:  None.   ALLERGIES:  CODEINE, MORPHINE and DEMEROL.   SOCIAL HISTORY:  Positive for tobacco -- 1 pack every 2-3 days; she denies  alcohol.   FAMILY HISTORY:  She denies any family history of blood clots.   REVIEW OF SYSTEMS:  As per HPI, other review of systems negative.   PHYSICAL EXAMINATION:  GENERAL:  The patient is a pleasant, older female in  no apparent distress.  VITAL SIGNS:  Temperature is 98, blood pressure 128/86, pulse of 74,  respiratory rate 20, O2 SAT 100%.  HEENT:  PERRL.  EOMI.  Sclerae anicteric.  Moist mucous membranes.  NECK:  Supple.  No adenopathy.  No thyromegaly.  LUNGS:  Clear to auscultation bilaterally.  No crackles or wheezes.  CARDIOVASCULAR:  Regular rate and rhythm.  Normal S1 and S2.  No murmurs and  no gallops.  ABDOMEN:  Soft.  Bowel sounds present.  Nontender and non-distended.  No  organomegaly and no masses palpable.  EXTREMITIES:  No cyanosis.  She has right calf tenderness, especially  medially.  Her toes are dusky and cool to touch, but she has +2 posterior  tibial pulses and about +1 dorsalis pedis pulses.  Left lower extremity  within normal limits.  NEUROLOGIC:  Alert and oriented x3.  Cranial nerves II-XII grossly intact.  Sensory grossly intact.  Nonfocal exam.   IMAGING STUDY:  Lower extremity Doppler ultrasound:  Acute short-segment DVT  in the deep branch of the proximal peroneal vein.   LABORATORY DATA:  Her white cell count is 6.8, hemoglobin 15.3, hematocrit  44.6 and her platelet count is 254,000 and neutrophil count 65%.  Sodium is  139 with a potassium of 3.7, chloride 103, CO2 is 29, glucose 93, BUN is  12,  creatinine 0.9, calcium 10.2.  LFTs are unremarkable.  Her PT is 12.1, INR  0.9.   ASSESSMENT AND PLAN:  1.  Recurrent deep venous thrombosis -- as discussed above, the patient's      last deep venous thrombosis was about 3 years ago and was attributed to      her recent knee surgery at the time, and the patient was placed on      Coumadin for a year and that was discontinued about 2 years ago.  Since      this is her second deep venous thrombosis, I will obtain a      hypercoagulable workup.  I will ask Social Services to see the patient      regarding outpatient Lovenox.  The patient will be placed on Lovenox and      Coumadin until her INR is therapeutic with close monitoring of her      PT/INR.  2.  History of right knee arthroscopy.  3.  Tobacco abuse.      Kela Millin, M.D.  Electronically Signed     ACV/MEDQ  D:  05/29/2006  T:  05/29/2006  Job:  [id number]   cc:   Jethro Bastos, M.D.  Fax: [phone]

## 2011-04-13 NOTE — Discharge Summary (Signed)
[name], [name]            ACCOUNT NO.:  [id number]   MEDICAL RECORD NO.:  [id number]          PATIENT TYPE:  INP   LOCATION:  4714                         FACILITY:  [facility]   PHYSICIAN:  [name], MDDATE OF BIRTH:  [birth year]   DATE OF ADMISSION:  05/28/2006  DATE OF DISCHARGE:                                 DISCHARGE SUMMARY   DISCHARGE DIAGNOSES:  1.  Recurrent deep vein thrombosis, right lower extremity.  2.  Hyperhomocysteinemia  3.  Tobacco use, counseled against.   DISCHARGE MEDICATIONS:  1.  Lovenox 100 mg subcutaneously daily until INR is greater than or equal      to 2.0.  2.  Warfarin 7.5 mg daily, will need adjustment to keep INR between 2.0 and      3.0.  3.  Folic acid 1 mg a day.   CONDITION:  Stable.   ACTIVITY:  No heavy exertion for 2 days.   FOLLOWUP:  Dr. Dorothe Pea next week for an INR check.   DIET:  Should be Coumadin friendly.   PERTINENT LABORATORY DATA:  CBC unremarkable.  INR 0.9.  Complete metabolic  panel unremarkable.  Homocystine level 20.   SPECIAL STUDIES AND RADIOLOGY:  EKG showed normal sinus rhythm.  Venous  Doppler of the right lower extremity shows acute short segment of DVT in the  deep branch of the proximal peroneal vein.   HISTORY AND HOSPITAL COURSE:  [name] is a pleasant 65 year old female  patient of Dr. Dorothe Pea who presented with right foot discoloration and right  calf discomfort.  She had had a DVT 3 years ago in that leg which was  attributed to a recent knee arthroscopy and had been on a course of Coumadin  which has since been stopped.  She smokes cigarettes but otherwise has no  risk factors for DVT currently.  She had a Doppler which showed another  acute DVT in that leg.  Please see H&P for complete admission details.  She  had normal vital signs and had calf tenderness on the right.  She was  started on Lovenox and Coumadin.  I have arranged home health for assistance  with self Lovenox  injection and daily INR.  She will need the Lovenox until  her INR is greater than or equal to 2.0.  She will need close followup with  Dr. Dorothe Pea.  She reports that she had been on, she believes, 7.5 mg of  Coumadin daily, which I have sent her home on, but she has only got it in a  single dose of Coumadin during this hospitalization and I suspect the dose  will need to be changed.  She had a hypercoagulable workup ordered due to  the recurrent nature of the DVT.  At this time only the homocystine level is  back.  It is elevated and I have started her on folic acid.  This may be  contributing to the problem.  Also, I have instructed her to quit smoking.  The rest of her  hypercoagulable panel is still pending and will need to be followed up as an  outpatient.  I will leave a message for Dr. Dorothe Pea about followup plans.   Total time on the day of discharge is 40 minutes.      [name], MD  Electronically Signed     CLS/MEDQ  D:  05/29/2006  T:  05/29/2006  Job:  [id number]   cc:   Jethro Bastos, M.D.  Fax: [phone]

## 2011-04-13 NOTE — Op Note (Signed)
[name], [name]                      ACCOUNT NO.:  [id number]   MEDICAL RECORD NO.:  [id number]                   PATIENT TYPE:  AMB   LOCATION:  DSC                                  FACILITY:  [facility]   PHYSICIAN:  Rodney A. Chaney Malling, M.D.           DATE OF BIRTH:  Jul 01, 1946   DATE OF PROCEDURE:  12/02/2003  DATE OF DISCHARGE:                                 OPERATIVE REPORT   PREOPERATIVE DIAGNOSIS:  Tear of posterior horn of medial meniscus, right  knee.   POSTOPERATIVE DIAGNOSIS:  Tear undersurface of posterior horn of medial  meniscus, right knee.  Chondromalacia medial patella facet right knee.   OPERATION PERFORMED:  Arthroscopy; chondroplasty medial patella facet, right  knee; debridement of posterior horn medial meniscus right knee.   SURGEON:  Lenard Galloway. Chaney Malling, M.D.   ANESTHESIA:  MAC.   PATHOLOGY:  With the arthroscope in the knee, very careful examination of  the knee was undertaken.  The patellofemoral joint was visualized first.  The femoral notch appeared normal but there was grade 2 chondromalacia of  the medial patellar facet.  The lateral compartment was then visualized and  articular cartilage of lateral femoral condyle, lateral tibial plateau and  entire circumference of the lateral meniscus was normal.  The anterior  cruciate ligament was normal.  With the scope in the medial compartment, the  anterior two thirds of the medial meniscus was normal.  Articular cartilage  of the femoral condyle and tibia was normal.  The posterior horn was  visualized.  This appeared fairly normal on the dorsal surface but when  palpated with a probe, it felt inadequate.  The posterior horn was then  elevated and there was a tear on the undersurface of the posterior horn.   DESCRIPTION OF PROCEDURE:  The patient was placed on the operating table in  the supine position with the pneumatic tourniquet about the right upper  thigh.  The right leg was placed in the  ankle holder and entire right lower  extremity was prepped with DuraPrep and draped out in the usual manner.  Marcaine had been placed in knee and Xylocaine and epinephrine used to  infiltrate the puncture wounds.  An infusion cannula was placed in the  superomedial pouch and the knee distended with saline.  Anteromedial and  anterolateral portals were made and the arthroscope was introduced.  Attention was first turned to the medial patellar facet which was debrided  with the chondroplasty shaver.  Excellent decompression of this was  achieved.  The arthroscope was then passed into the medial compartment and  it was noted that there was a tear of the posterior horn of the medial  meniscus but on the undersurface the superior surface was still intact.  The  defect in the undersurface could be separated with a probe quite easily and  this was frayed and torn. Series of baskets were inserted through both  portals and the  posterior horn up to the tear was debrided.  This was  followed by the intra-articular shaver.  All the debris was removed. The  remaining rim was then smoothed and balanced with nice transition to the  midthird of the medial meniscus.  The knee was then filled with Marcaine.  A  large bulky compressive dressing was applied.  The patient was then  transferred to the recovery room in excellent condition.  Technically this  procedure went extremely well.   DRAINS:  None.   COMPLICATIONS:  None.                                               Rodney A. Chaney Malling, M.D.    RAM/MEDQ  D:  12/02/2003  T:  12/02/2003  Job:  [id number]

## 2011-05-23 ENCOUNTER — Ambulatory Visit (INDEPENDENT_AMBULATORY_CARE_PROVIDER_SITE_OTHER): Payer: BC Managed Care – PPO | Admitting: Family Medicine

## 2011-05-23 DIAGNOSIS — I82409 Acute embolism and thrombosis of unspecified deep veins of unspecified lower extremity: Secondary | ICD-10-CM

## 2011-05-23 DIAGNOSIS — D649 Anemia, unspecified: Secondary | ICD-10-CM

## 2011-05-23 LAB — POCT INR: INR: 1.8

## 2011-05-23 MED ORDER — CYANOCOBALAMIN 1000 MCG/ML IJ SOLN
1000.0000 ug | Freq: Once | INTRAMUSCULAR | Status: AC
  Administered 2011-05-23: 1000 ug via INTRAMUSCULAR

## 2011-05-23 NOTE — Patient Instructions (Signed)
10 mg on mondays,wednesdays and fridays 7.5mg  on other days, check in 2 weeks

## 2011-06-06 ENCOUNTER — Encounter: Payer: Self-pay | Admitting: Family Medicine

## 2011-06-07 ENCOUNTER — Other Ambulatory Visit: Payer: Self-pay | Admitting: Family Medicine

## 2011-06-11 ENCOUNTER — Ambulatory Visit: Payer: BC Managed Care – PPO | Admitting: Family Medicine

## 2011-06-13 ENCOUNTER — Telehealth: Payer: Self-pay

## 2011-06-13 ENCOUNTER — Ambulatory Visit (INDEPENDENT_AMBULATORY_CARE_PROVIDER_SITE_OTHER): Payer: BC Managed Care – PPO | Admitting: Family Medicine

## 2011-06-13 ENCOUNTER — Encounter: Payer: Self-pay | Admitting: Family Medicine

## 2011-06-13 VITALS — BP 108/80 | HR 102 | Temp 98.3°F | Wt 150.0 lb

## 2011-06-13 DIAGNOSIS — F411 Generalized anxiety disorder: Secondary | ICD-10-CM

## 2011-06-13 DIAGNOSIS — E539 Vitamin B deficiency, unspecified: Secondary | ICD-10-CM

## 2011-06-13 DIAGNOSIS — E785 Hyperlipidemia, unspecified: Secondary | ICD-10-CM

## 2011-06-13 DIAGNOSIS — I10 Essential (primary) hypertension: Secondary | ICD-10-CM

## 2011-06-13 DIAGNOSIS — I82409 Acute embolism and thrombosis of unspecified deep veins of unspecified lower extremity: Secondary | ICD-10-CM

## 2011-06-13 DIAGNOSIS — F419 Anxiety disorder, unspecified: Secondary | ICD-10-CM

## 2011-06-13 DIAGNOSIS — Z7901 Long term (current) use of anticoagulants: Secondary | ICD-10-CM

## 2011-06-13 LAB — CBC WITH DIFFERENTIAL/PLATELET
Basophils Absolute: 0 10*3/uL (ref 0.0–0.1)
Eosinophils Relative: 1.4 % (ref 0.0–5.0)
Hemoglobin: 15.1 g/dL — ABNORMAL HIGH (ref 12.0–15.0)
Lymphs Abs: 1.9 10*3/uL (ref 0.7–4.0)
Monocytes Absolute: 0.5 10*3/uL (ref 0.1–1.0)
Monocytes Relative: 7.1 % (ref 3.0–12.0)
RDW: 13.8 % (ref 11.5–14.6)

## 2011-06-13 LAB — POCT URINALYSIS DIPSTICK
Bilirubin, UA: NEGATIVE
Glucose, UA: NEGATIVE
Ketones, UA: NEGATIVE
Leukocytes, UA: NEGATIVE
Nitrite, UA: NEGATIVE
Protein, UA: NEGATIVE
Urobilinogen, UA: 0.2
pH, UA: 7

## 2011-06-13 LAB — POCT INR: INR: 3.4

## 2011-06-13 MED ORDER — WARFARIN SODIUM 5 MG PO TABS: ORAL_TABLET | ORAL | Status: DC

## 2011-06-13 MED ORDER — CYANOCOBALAMIN 1000 MCG/ML IJ SOLN
1000.0000 ug | Freq: Once | INTRAMUSCULAR | Status: AC
  Administered 2011-06-13: 1000 ug via INTRAMUSCULAR

## 2011-06-13 NOTE — Telephone Encounter (Signed)
Pharmacy called to verify instructions on coumadin. Pharmacist put current Rx for 100 tabs on hold

## 2011-06-13 NOTE — Progress Notes (Signed)
Addended by: Aniceto Boss A on: 06/13/2011 04:25 PM   Modules accepted: Orders

## 2011-06-13 NOTE — Patient Instructions (Addendum)
10mg . On Mon. And Fri. And all other days take 7.5mg  Check in 2 weeks

## 2011-06-13 NOTE — Progress Notes (Signed)
  Subjective:    Patient ID: [name], female    DOB: 01-25-1946, 65 y.o.   MRN: [id number]  HPI Here to follow up on multiple issues. First of all she has been under tremendous stress lately. She has been taking care of her mother-in-law at home who has severe CHF and other problems, then 2 days ago her own mother was admitted to the hospital for heart problems and lung cancer. She is stressed, tired, gets shaky, feels her heart pounding at times, and gets chest pressure. No sweats or nausea. These symptoms are not related to exertion. She is fasting today.    Review of Systems  Constitutional: Negative.   Respiratory: Positive for chest tightness. Negative for cough, choking, shortness of breath and wheezing.   Cardiovascular: Positive for palpitations. Negative for chest pain and leg swelling.  Gastrointestinal: Negative.   Psychiatric/Behavioral: Positive for agitation. The patient is nervous/anxious.        Objective:   Physical Exam  Constitutional: She appears well-developed and well-nourished.  Neck: No thyromegaly present.  Cardiovascular: Normal rate, regular rhythm, normal heart sounds and intact distal pulses.  Exam reveals no gallop and no friction rub.   No murmur heard. Pulmonary/Chest: Effort normal and breath sounds normal.  Lymphadenopathy:    She has no cervical adenopathy.  Psychiatric:       Anxious and tearful           Assessment & Plan:  Her chest symptoms are all related to anxiety, so I reassured her. Offered to give her meds to help with the anxiety, but she declined. Will get labs today .

## 2011-06-14 ENCOUNTER — Telehealth: Payer: Self-pay

## 2011-06-14 LAB — BASIC METABOLIC PANEL
CO2: 30 mEq/L (ref 19–32)
Calcium: 9.6 mg/dL (ref 8.4–10.5)
Creatinine, Ser: 0.9 mg/dL (ref 0.4–1.2)
GFR: 70.1 mL/min (ref >60.00)
Glucose, Bld: 92 mg/dL (ref 70–99)
Potassium: 3.7 mEq/L (ref 3.5–5.1)
Sodium: 138 mEq/L (ref 135–145)

## 2011-06-14 LAB — HEPATIC FUNCTION PANEL
ALT: 22 U/L (ref 0–35)
Albumin: 4.5 g/dL (ref 3.5–5.2)
Total Bilirubin: 0.5 mg/dL (ref 0.3–1.2)

## 2011-06-14 LAB — TSH: TSH: 0.68 u[IU]/mL (ref 0.35–5.50)

## 2011-06-14 LAB — LIPID PANEL
LDL Cholesterol: 95 mg/dL (ref 0–99)
Total CHOL/HDL Ratio: 2

## 2011-06-14 NOTE — Telephone Encounter (Signed)
Pt.notified

## 2011-06-14 NOTE — Telephone Encounter (Signed)
Message copied by Beverely Low on Thu Jun 14, 2011  4:43 PM ------      Message from: Gershon Crane A      Created: Thu Jun 14, 2011  1:34 PM       normal

## 2011-06-27 ENCOUNTER — Ambulatory Visit (INDEPENDENT_AMBULATORY_CARE_PROVIDER_SITE_OTHER): Payer: Medicare Other | Admitting: Family Medicine

## 2011-06-27 DIAGNOSIS — E539 Vitamin B deficiency, unspecified: Secondary | ICD-10-CM

## 2011-06-27 DIAGNOSIS — I82409 Acute embolism and thrombosis of unspecified deep veins of unspecified lower extremity: Secondary | ICD-10-CM

## 2011-06-27 LAB — POCT INR: INR: 1.9

## 2011-06-27 MED ORDER — CYANOCOBALAMIN 1000 MCG/ML IJ SOLN
1000.0000 ug | Freq: Once | INTRAMUSCULAR | Status: AC
  Administered 2011-06-27: 1000 ug via INTRAMUSCULAR

## 2011-07-11 ENCOUNTER — Ambulatory Visit: Payer: Medicare Other | Admitting: Family Medicine

## 2011-07-17 ENCOUNTER — Ambulatory Visit (INDEPENDENT_AMBULATORY_CARE_PROVIDER_SITE_OTHER): Payer: Medicare Other | Admitting: Family Medicine

## 2011-07-17 ENCOUNTER — Encounter: Payer: Self-pay | Admitting: Family Medicine

## 2011-07-17 VITALS — BP 110/78 | HR 70 | Temp 98.2°F | Wt 153.0 lb

## 2011-07-17 DIAGNOSIS — E539 Vitamin B deficiency, unspecified: Secondary | ICD-10-CM

## 2011-07-17 DIAGNOSIS — R600 Localized edema: Secondary | ICD-10-CM

## 2011-07-17 DIAGNOSIS — L259 Unspecified contact dermatitis, unspecified cause: Secondary | ICD-10-CM

## 2011-07-17 DIAGNOSIS — I82409 Acute embolism and thrombosis of unspecified deep veins of unspecified lower extremity: Secondary | ICD-10-CM

## 2011-07-17 DIAGNOSIS — R609 Edema, unspecified: Secondary | ICD-10-CM

## 2011-07-17 DIAGNOSIS — L309 Dermatitis, unspecified: Secondary | ICD-10-CM

## 2011-07-17 LAB — POCT INR: INR: 2

## 2011-07-17 MED ORDER — TRIAMCINOLONE ACETONIDE 0.1 % EX CREA: TOPICAL_CREAM | Freq: Two times a day (BID) | CUTANEOUS | Status: DC | PRN

## 2011-07-17 MED ORDER — CYANOCOBALAMIN 1000 MCG/ML IJ SOLN
1000.0000 ug | Freq: Once | INTRAMUSCULAR | Status: AC
  Administered 2011-07-17: 1000 ug via INTRAMUSCULAR

## 2011-07-17 NOTE — Progress Notes (Signed)
Addended by: Aniceto Boss A on: 07/17/2011 02:21 PM   Modules accepted: Orders

## 2011-07-17 NOTE — Progress Notes (Signed)
  Subjective:    Patient ID: [name], female    DOB: [birth year], 65 y.o.   MRN: [id number]  HPI Here for several weeks of an intermittent itchy red rash on the anterior neck. She has never had this before. No other rashes. Also she has had some mild puffiness in the legs for a few weeks. She is still on HCTZ daily. No cough or SOB. She had labs last month that were normal.    Review of Systems  Constitutional: Negative.   HENT: Negative.   Respiratory: Negative.   Cardiovascular: Positive for leg swelling. Negative for chest pain and palpitations.  Skin: Positive for rash.       Objective:   Physical Exam  Constitutional: She appears well-developed and well-nourished.  Cardiovascular: Normal rate, regular rhythm, normal heart sounds and intact distal pulses.  Exam reveals no gallop and no friction rub.   No murmur heard. Pulmonary/Chest: Effort normal and breath sounds normal. No respiratory distress. She has no wheezes. She has no rales. She exhibits no tenderness.  Musculoskeletal:       1+ edema to both legs  Skin:       Patch of dull scaly erythematous skin on anterior neck           Assessment & Plan:  Try Triamcinolone cream prn to the neck. The edema may be from the heat outside. Recheck if it gets worse

## 2011-07-17 NOTE — Patient Instructions (Signed)
10mg . On Mondays wednesdays and  Fridays And all other days take 7.5mg . Check in 4 weeks

## 2011-08-29 LAB — CBC
HCT: 40.7
HCT: 40.9
HCT: 44
Hemoglobin: 14.1
Hemoglobin: 14.7
MCHC: 33.9
MCHC: 34.6
MCV: 89.7
MCV: 91.6
RBC: 4.47
RBC: 4.53
RBC: 4.78
RDW: 13.3
RDW: 13.6
WBC: 5.8

## 2011-08-29 LAB — POCT I-STAT, CHEM 8
BUN: 9
Chloride: 101
Creatinine, Ser: 1
Sodium: 139

## 2011-08-29 LAB — DIFFERENTIAL
Eosinophils Relative: 2
Lymphocytes Relative: 30
Lymphs Abs: 2.1
Monocytes Absolute: 0.5
Monocytes Relative: 7

## 2011-08-29 LAB — APTT: aPTT: 33

## 2011-11-19 ENCOUNTER — Ambulatory Visit (INDEPENDENT_AMBULATORY_CARE_PROVIDER_SITE_OTHER): Payer: Medicare Other | Admitting: Family Medicine

## 2011-11-19 ENCOUNTER — Encounter: Payer: Self-pay | Admitting: Family Medicine

## 2011-11-19 VITALS — BP 108/70 | HR 88 | Temp 98.3°F | Wt 152.0 lb

## 2011-11-19 DIAGNOSIS — D3613 Benign neoplasm of peripheral nerves and autonomic nervous system of lower limb, including hip: Secondary | ICD-10-CM

## 2011-11-19 DIAGNOSIS — I82409 Acute embolism and thrombosis of unspecified deep veins of unspecified lower extremity: Secondary | ICD-10-CM

## 2011-11-19 DIAGNOSIS — D2120 Benign neoplasm of connective and other soft tissue of unspecified lower limb, including hip: Secondary | ICD-10-CM

## 2011-11-19 DIAGNOSIS — E539 Vitamin B deficiency, unspecified: Secondary | ICD-10-CM

## 2011-11-19 DIAGNOSIS — I10 Essential (primary) hypertension: Secondary | ICD-10-CM

## 2011-11-19 MED ORDER — CYANOCOBALAMIN 1000 MCG/ML IJ SOLN
1000.0000 ug | Freq: Once | INTRAMUSCULAR | Status: AC
  Administered 2011-11-19: 1000 ug via INTRAMUSCULAR

## 2011-11-19 NOTE — Progress Notes (Signed)
  Subjective:    Patient ID: [name], female    DOB: Sep 12, 1946, 65 y.o.   MRN: [id number]  HPI Here to get a B12 shot, since she has skipped the past 3 or 4 months. Also to get her INR checked, which is therapeutic today at 2.0. Also she has had a benign lesion removed from the right foot twice by Dr. Charlann Boxer, but each time it has grown back. Now it has gotten quite larger and it is painful from rubbing against her shoes.    Review of Systems  Constitutional: Negative.   Respiratory: Negative.   Cardiovascular: Negative.        Objective:   Physical Exam  Constitutional: She appears well-developed and well-nourished.  Cardiovascular: Normal rate, regular rhythm, normal heart sounds and intact distal pulses.   Pulmonary/Chest: Effort normal and breath sounds normal.  Musculoskeletal:       The dorsal right foot has a firm mobile nodular lesion about 1.2 cm across           Assessment & Plan:  Her Coumadin dose is therapeutic. Get back on regular B12 shots. Refer to Podiatry for the foot lesion

## 2011-11-19 NOTE — Patient Instructions (Signed)
  Latest dosing instructions   Total Sun Mon Tue Wed Thu Fri Sat   60 7.5 mg 10 mg 7.5 mg 10 mg 7.5 mg 10 mg 7.5 mg    (5 mg1.5) (5 mg2) (5 mg1.5) (5 mg2) (5 mg1.5) (5 mg2) (5 mg1.5)        

## 2011-11-27 DIAGNOSIS — C3490 Malignant neoplasm of unspecified part of unspecified bronchus or lung: Secondary | ICD-10-CM

## 2011-11-27 HISTORY — DX: Malignant neoplasm of unspecified part of unspecified bronchus or lung: C34.90

## 2011-12-16 ENCOUNTER — Emergency Department (HOSPITAL_COMMUNITY)
Admission: EM | Admit: 2011-12-16 | Discharge: 2011-12-16 | Disposition: A | Discharge status: Home / Self Care | Payer: Medicare Other | Attending: Emergency Medicine | Admitting: Emergency Medicine

## 2011-12-16 ENCOUNTER — Encounter (HOSPITAL_COMMUNITY): Payer: Self-pay

## 2011-12-16 DIAGNOSIS — H1130 Conjunctival hemorrhage, unspecified eye: Secondary | ICD-10-CM

## 2011-12-16 DIAGNOSIS — Z7901 Long term (current) use of anticoagulants: Secondary | ICD-10-CM | POA: Insufficient documentation

## 2011-12-16 DIAGNOSIS — Z79899 Other long term (current) drug therapy: Secondary | ICD-10-CM | POA: Insufficient documentation

## 2011-12-16 DIAGNOSIS — Z86718 Personal history of other venous thrombosis and embolism: Secondary | ICD-10-CM | POA: Insufficient documentation

## 2011-12-16 DIAGNOSIS — H5789 Other specified disorders of eye and adnexa: Secondary | ICD-10-CM | POA: Insufficient documentation

## 2011-12-16 LAB — DIFFERENTIAL
Eosinophils Absolute: 0.1 10*3/uL (ref 0.0–0.7)
Eosinophils Relative: 2 % (ref 0–5)
Lymphs Abs: 1.5 10*3/uL (ref 0.7–4.0)
Monocytes Absolute: 0.4 10*3/uL (ref 0.1–1.0)
Monocytes Relative: 7 % (ref 3–12)
Neutrophils Relative %: 64 % (ref 43–77)

## 2011-12-16 LAB — PROTIME-INR: INR: 1.76 — ABNORMAL HIGH (ref 0.00–1.49)

## 2011-12-16 LAB — CBC
HCT: 41.8 % (ref 36.0–46.0)
Hemoglobin: 14.4 g/dL (ref 12.0–15.0)
MCH: 30.6 pg (ref 26.0–34.0)
MCV: 88.9 fL (ref 78.0–100.0)
RBC: 4.7 MIL/uL (ref 3.87–5.11)

## 2011-12-16 NOTE — ED Provider Notes (Signed)
History     CSN: [id number]  Arrival date & time 12/16/11  7829   First MD Initiated Contact with Patient 12/16/11 1018      Chief Complaint  Patient presents with  . Eye Drainage    (Consider location/radiation/quality/duration/timing/severity/associated sxs/prior treatment) Patient is a 66 y.o. female presenting with eye problem. The history is provided by the patient. No language interpreter was used.  Eye Problem  The current episode started 2 days ago. The problem occurs constantly. The problem has been gradually worsening. There is pain in the right eye. There was no injury mechanism. The patient is experiencing no pain. There is no history of trauma to the eye. There is no known exposure to pink eye. She does not wear contacts. Associated symptoms include foreign body sensation and eye redness. Pertinent negatives include no numbness, no blurred vision, no decreased vision, no discharge, no double vision, no photophobia, no nausea, no vomiting, no tingling, no weakness and no itching. She has tried nothing for the symptoms.   2 here today with a subconjunctival hemorrhage to her R eye.  States that 3 days ago she had a foreign body sensation and it went away. States yesterday for a few seconds she had pain to her right lateral eye that disappeared.she noticed blood in her R lateral sclera yesterday and woke today with blood  and wo States that she hasn't noted vision loss and no exposure to chemicals no Valsalva maneuvers no trauma to the eye. States she does not have any visual streaks or dots/floaters. She has been on coumadin for 7 years for DVT x3. We will check her INR today and possibly followup with her opthamologist. Pain free with no visual problems today.   Past Medical History  Diagnosis Date  . DVT (deep venous thrombosis)     had 2 in right calf & 1 in the left calf  . Asthma   . Cataract   . Clotting disorder   . Anemia     Past Surgical History  Procedure Date  .  Appendectomy   . Cholecystectomy   . Tonsillectomy   . Knee arthroscopy     right  . Laminectomy     lumbar  . Colonoscopy     11/28/09 repeat in 10 years    Family History  Problem Relation Age of Onset  . Anemia      FE deficiency   . Arthritis      family history  . Cancer      colon 1st degree relative <60  . Hypertension      family history    History  Substance Use Topics  . Smoking status: Current Everyday Smoker -- 0.5 packs/day    Types: Cigarettes  . Smokeless tobacco: Never Used  . Alcohol Use: No    OB History    Grav Para Term Preterm Abortions TAB SAB Ect Mult Living                  Review of Systems  Eyes: Positive for redness. Negative for blurred vision, double vision, photophobia and discharge.  Gastrointestinal: Negative for nausea and vomiting.  Skin: Negative for itching.  Neurological: Negative for tingling, weakness and numbness.  All other systems reviewed and are negative.    Allergies  Codeine and Morphine  Home Medications   Current Outpatient Rx  Name Route Sig Dispense Refill  . ACETAMINOPHEN 500 MG PO TABS Oral Take 500 mg by mouth every 6 (six)  hours as needed. For pain    . CALCIUM CARBONATE 600 MG PO TABS Oral Take 600 mg by mouth daily. + 600 mg Vit D + 400 mg of Magnesium     . CYANOCOBALAMIN IJ Injection Inject 1 application as directed every 30 (thirty) days. Around the 22nd or 23rd of each month    . HYDROCHLOROTHIAZIDE 25 MG PO TABS  TAKE 1 TABLET BY MOUTH EVERY DAY 30 tablet 11  . POTASSIUM 99 MG PO TABS Oral Take 1 tablet by mouth daily.      Marland Kitchen SIMVASTATIN 20 MG PO TABS  TAKE 1 TABLET BY MOUTH EVERY DAY 30 tablet 11  . WARFARIN SODIUM 5 MG PO TABS Oral Take 7.5-10 mg by mouth daily. Monday, Wednesday and Friday take 10 mg (2 tabs); Tuesday, Thursday, Saturday and Sunday take 7.5 mg (1 1/2 tabs)      BP 110/75  Pulse 87  Temp(Src) 98 F (36.7 C) (Oral)  Resp 18  SpO2 95%  Physical Exam  Nursing note and  vitals reviewed. Constitutional: She is oriented to person, place, and time. She appears well-developed and well-nourished.  HENT:  Head: Normocephalic and atraumatic.  Eyes: EOM and lids are normal. Pupils are equal, round, and reactive to light. No foreign bodies found. Right eye exhibits no discharge, no exudate and no hordeolum. No foreign body present in the right eye. Left eye exhibits no discharge, no exudate and no hordeolum. No foreign body present in the left eye. Right conjunctiva has a hemorrhage. Left conjunctiva has no hemorrhage.  Neck: Normal range of motion. Neck supple.  Cardiovascular: Normal rate, regular rhythm, normal heart sounds and intact distal pulses.  Exam reveals no gallop and no friction rub.   No murmur heard. Pulmonary/Chest: Effort normal and breath sounds normal.  Abdominal: Soft. Bowel sounds are normal.  Musculoskeletal: Normal range of motion. She exhibits no edema and no tenderness.  Neurological: She is alert and oriented to person, place, and time. She has normal reflexes.  Skin: Skin is warm and dry.  Psychiatric: She has a normal mood and affect.    ED Course  Procedures (including critical care time)  Labs Reviewed  PROTIME-INR - Abnormal; Notable for the following:    Prothrombin Time 20.8 (*)    INR 1.76 (*)    All other components within normal limits  CBC  DIFFERENTIAL  LAB REPORT - SCANNED   No results found.   1. Subconjunctival hemorrhage       MDM  subconjunctive hemorrrhage x 2 days.  On coumadin with inr today 1.76. No pain or visual deficits.  Will follow up with opthomology asap.  Dr Manus Gunning saw patient as well.  Labs Reviewed  PROTIME-INR - Abnormal; Notable for the following:    Prothrombin Time 20.8 (*)    INR 1.76 (*)    All other components within normal limits  CBC  DIFFERENTIAL  LAB REPORT - SCANNED          Jethro Bastos, NP 12/17/11 1337

## 2011-12-16 NOTE — ED Notes (Signed)
Patient here for red/bleeding into sclera of right eye. Takes coumadin

## 2011-12-16 NOTE — ED Notes (Signed)
Pt complains of bleeding to sclera of right eye, sts on coumadin and did not take today. sts on Thursday may have had foreign body to eye and then woke up over the last two days and noticed increase in amt of redness noted. Pt sts tender when palpated and light sensitivity noted.

## 2011-12-17 NOTE — ED Provider Notes (Signed)
Medical screening examination/treatment/procedure(s) were conducted as a shared visit with non-physician practitioner(s) and myself.  I personally evaluated the patient during the encounter  Subconjunctival hemorrhage on coumadin.  No visual change.  No hyphema or pain with eye movement.  Glynn Octave, MD 12/17/11 [phone]

## 2012-01-02 ENCOUNTER — Encounter: Payer: Self-pay | Admitting: Family Medicine

## 2012-01-02 ENCOUNTER — Ambulatory Visit (INDEPENDENT_AMBULATORY_CARE_PROVIDER_SITE_OTHER): Payer: Medicare Other | Admitting: Family Medicine

## 2012-01-02 VITALS — BP 104/76 | HR 100 | Temp 98.5°F | Wt 150.0 lb

## 2012-01-02 DIAGNOSIS — F17200 Nicotine dependence, unspecified, uncomplicated: Secondary | ICD-10-CM

## 2012-01-02 DIAGNOSIS — I82409 Acute embolism and thrombosis of unspecified deep veins of unspecified lower extremity: Secondary | ICD-10-CM

## 2012-01-02 DIAGNOSIS — N390 Urinary tract infection, site not specified: Secondary | ICD-10-CM

## 2012-01-02 DIAGNOSIS — E539 Vitamin B deficiency, unspecified: Secondary | ICD-10-CM

## 2012-01-02 DIAGNOSIS — Z720 Tobacco use: Secondary | ICD-10-CM

## 2012-01-02 LAB — POCT URINALYSIS DIPSTICK
Bilirubin, UA: NEGATIVE
Glucose, UA: NEGATIVE
Spec Grav, UA: 1.01
Urobilinogen, UA: 0.2

## 2012-01-02 MED ORDER — VARENICLINE TARTRATE 0.5 MG X 11 & 1 MG X 42 PO MISC: ORAL | Status: AC

## 2012-01-02 MED ORDER — CIPROFLOXACIN HCL 500 MG PO TABS: 500.0000 mg | ORAL_TABLET | Freq: Two times a day (BID) | ORAL | Status: AC

## 2012-01-02 MED ORDER — VARENICLINE TARTRATE 1 MG PO TABS: 1.0000 mg | ORAL_TABLET | Freq: Two times a day (BID) | ORAL | Status: AC

## 2012-01-02 MED ORDER — CYANOCOBALAMIN 1000 MCG/ML IJ SOLN
1000.0000 ug | Freq: Once | INTRAMUSCULAR | Status: AC
  Administered 2012-01-02: 1000 ug via INTRAMUSCULAR

## 2012-01-02 NOTE — Progress Notes (Signed)
Addended by: Aniceto Boss A on: 01/02/2012 01:51 PM   Modules accepted: Orders

## 2012-01-02 NOTE — Progress Notes (Signed)
  Subjective:    Patient ID: [name], female    DOB: 08/18/1946, 66 y.o.   MRN: [id number]  HPI Here for 2 things. First she wants help to quit smoking. She has tried OTC gums and patches to no avail. Second she has had 3 days of urgency to urinate with foul odor to the urine. No pain or burning or fever.    Review of Systems  Constitutional: Negative.   Gastrointestinal: Negative.   Genitourinary: Positive for urgency and frequency. Negative for hematuria and pelvic pain.       Objective:   Physical Exam  Constitutional: She appears well-developed and well-nourished.  Abdominal: Soft. Bowel sounds are normal. She exhibits no distension and no mass. There is no tenderness. There is no rebound and no guarding.          Assessment & Plan:  Treat the UTI with Cipro, and try Chantix

## 2012-01-02 NOTE — Patient Instructions (Signed)
  Latest dosing instructions   Total Sun Mon Tue Wed Thu Fri Sat   60 7.5 mg 10 mg 7.5 mg 10 mg 7.5 mg 10 mg 7.5 mg    (5 mg1.5) (5 mg2) (5 mg1.5) (5 mg2) (5 mg1.5) (5 mg2) (5 mg1.5)        

## 2012-02-15 ENCOUNTER — Other Ambulatory Visit: Payer: Self-pay | Admitting: Family Medicine

## 2012-02-18 ENCOUNTER — Other Ambulatory Visit: Payer: Self-pay | Admitting: Family Medicine

## 2012-02-21 ENCOUNTER — Other Ambulatory Visit: Payer: Self-pay | Admitting: Family Medicine

## 2012-02-21 NOTE — Telephone Encounter (Signed)
Pt when to pick up rx for hydrochlorothiazide (HYDRODIURIL) 25 MG tablet and simvastatin (ZOCOR) 20 MG tablet pharmacy say then never received the refill request. Pt requesting to have medications called in to pharmacy

## 2012-04-02 ENCOUNTER — Ambulatory Visit (INDEPENDENT_AMBULATORY_CARE_PROVIDER_SITE_OTHER): Payer: Medicare Other | Admitting: Family Medicine

## 2012-04-02 DIAGNOSIS — E539 Vitamin B deficiency, unspecified: Secondary | ICD-10-CM

## 2012-04-02 DIAGNOSIS — I82409 Acute embolism and thrombosis of unspecified deep veins of unspecified lower extremity: Secondary | ICD-10-CM

## 2012-04-02 LAB — POCT INR: INR: 2.3

## 2012-04-02 MED ORDER — CYANOCOBALAMIN 1000 MCG/ML IJ SOLN
1000.0000 ug | Freq: Once | INTRAMUSCULAR | Status: AC
  Administered 2012-04-02: 1000 ug via INTRAMUSCULAR

## 2012-04-02 NOTE — Patient Instructions (Signed)
  Latest dosing instructions   Total Sun Mon Tue Wed Thu Fri Sat   60 7.5 mg 10 mg 7.5 mg 10 mg 7.5 mg 10 mg 7.5 mg    (5 mg1.5) (5 mg2) (5 mg1.5) (5 mg2) (5 mg1.5) (5 mg2) (5 mg1.5)        

## 2012-05-15 ENCOUNTER — Ambulatory Visit: Payer: Medicare Other | Admitting: Family Medicine

## 2012-05-15 ENCOUNTER — Ambulatory Visit (INDEPENDENT_AMBULATORY_CARE_PROVIDER_SITE_OTHER): Payer: Medicare Other | Admitting: Family

## 2012-05-15 ENCOUNTER — Ambulatory Visit (INDEPENDENT_AMBULATORY_CARE_PROVIDER_SITE_OTHER): Payer: Medicare Other | Admitting: Family Medicine

## 2012-05-15 ENCOUNTER — Encounter: Payer: Self-pay | Admitting: Family Medicine

## 2012-05-15 VITALS — BP 96/70 | HR 72 | Temp 98.2°F | Wt 148.0 lb

## 2012-05-15 DIAGNOSIS — M79606 Pain in leg, unspecified: Secondary | ICD-10-CM

## 2012-05-15 DIAGNOSIS — E538 Deficiency of other specified B group vitamins: Secondary | ICD-10-CM

## 2012-05-15 DIAGNOSIS — M79609 Pain in unspecified limb: Secondary | ICD-10-CM

## 2012-05-15 DIAGNOSIS — I82409 Acute embolism and thrombosis of unspecified deep veins of unspecified lower extremity: Secondary | ICD-10-CM

## 2012-05-15 LAB — POCT INR: INR: 2.6

## 2012-05-15 MED ORDER — CYANOCOBALAMIN 1000 MCG/ML IJ SOLN
1000.0000 ug | Freq: Once | INTRAMUSCULAR | Status: AC
  Administered 2012-05-15: 1000 ug via INTRAMUSCULAR

## 2012-05-15 NOTE — Patient Instructions (Addendum)
10mg . On Mondays  and  Fridays And all other days take 7.5mg . Check in 4 weeks    Latest dosing instructions   Total Sun Mon Tue Wed Thu Fri Sat   60 7.5 mg 10 mg 7.5 mg 10 mg 7.5 mg 10 mg 7.5 mg    (5 mg1.5) (5 mg2) (5 mg1.5) (5 mg2) (5 mg1.5) (5 mg2) (5 mg1.5)

## 2012-05-15 NOTE — Addendum Note (Signed)
Addended by: Alfred Levins D on: 05/15/2012 09:35 AM   Modules accepted: Orders

## 2012-05-15 NOTE — Progress Notes (Signed)
  Subjective:    Patient ID: [name], female    DOB: December 28, 1945, 66 y.o.   MRN: [id number]  HPI Here for several days of soreness along the right lower leg. This is the area where she has had several DVTs in the past. Her last Doppler was done around 3 years ago. There has been no swelling in the leg or foot. No SOB or chest pain.    Review of Systems  Constitutional: Negative.   Respiratory: Negative.   Cardiovascular: Negative.   Musculoskeletal: Positive for myalgias.       Objective:   Physical Exam  Constitutional: She appears well-developed and well-nourished.  Cardiovascular: Normal rate, regular rhythm, normal heart sounds and intact distal pulses.   Pulmonary/Chest: Effort normal and breath sounds normal.  Musculoskeletal:       Mildly tender along the medial side of the right lower leg. No swelling or warmth. No cords. Negative Homans.           Assessment & Plan:  This is probably just some mild phlebitis. Elevated the leg. Use warm compresses. Check a Doppler to rule out a thrombus .

## 2012-05-26 ENCOUNTER — Inpatient Hospital Stay (HOSPITAL_COMMUNITY): Payer: Medicare Other

## 2012-05-26 ENCOUNTER — Encounter (HOSPITAL_COMMUNITY): Payer: Self-pay | Admitting: Emergency Medicine

## 2012-05-26 ENCOUNTER — Emergency Department (HOSPITAL_COMMUNITY): Payer: Medicare Other

## 2012-05-26 ENCOUNTER — Inpatient Hospital Stay (HOSPITAL_COMMUNITY)
Admission: EM | Admit: 2012-05-26 | Discharge: 2012-05-30 | DRG: 123 | Disposition: A | Discharge status: Home / Self Care | Payer: Medicare Other | Attending: Internal Medicine | Admitting: Internal Medicine

## 2012-05-26 DIAGNOSIS — Z79899 Other long term (current) drug therapy: Secondary | ICD-10-CM

## 2012-05-26 DIAGNOSIS — Z23 Encounter for immunization: Secondary | ICD-10-CM

## 2012-05-26 DIAGNOSIS — M545 Low back pain: Secondary | ICD-10-CM

## 2012-05-26 DIAGNOSIS — M6740 Ganglion, unspecified site: Secondary | ICD-10-CM

## 2012-05-26 DIAGNOSIS — E876 Hypokalemia: Secondary | ICD-10-CM | POA: Diagnosis not present

## 2012-05-26 DIAGNOSIS — Z8261 Family history of arthritis: Secondary | ICD-10-CM

## 2012-05-26 DIAGNOSIS — T45515A Adverse effect of anticoagulants, initial encounter: Secondary | ICD-10-CM | POA: Diagnosis present

## 2012-05-26 DIAGNOSIS — F17200 Nicotine dependence, unspecified, uncomplicated: Secondary | ICD-10-CM | POA: Diagnosis present

## 2012-05-26 DIAGNOSIS — H532 Diplopia: Principal | ICD-10-CM | POA: Diagnosis present

## 2012-05-26 DIAGNOSIS — N390 Urinary tract infection, site not specified: Secondary | ICD-10-CM

## 2012-05-26 DIAGNOSIS — D649 Anemia, unspecified: Secondary | ICD-10-CM

## 2012-05-26 DIAGNOSIS — Z800 Family history of malignant neoplasm of digestive organs: Secondary | ICD-10-CM

## 2012-05-26 DIAGNOSIS — I509 Heart failure, unspecified: Secondary | ICD-10-CM | POA: Diagnosis present

## 2012-05-26 DIAGNOSIS — Z888 Allergy status to other drugs, medicaments and biological substances status: Secondary | ICD-10-CM

## 2012-05-26 DIAGNOSIS — M25519 Pain in unspecified shoulder: Secondary | ICD-10-CM

## 2012-05-26 DIAGNOSIS — I739 Peripheral vascular disease, unspecified: Secondary | ICD-10-CM | POA: Diagnosis present

## 2012-05-26 DIAGNOSIS — IMO0002 Reserved for concepts with insufficient information to code with codable children: Secondary | ICD-10-CM

## 2012-05-26 DIAGNOSIS — R791 Abnormal coagulation profile: Secondary | ICD-10-CM | POA: Diagnosis present

## 2012-05-26 DIAGNOSIS — R270 Ataxia, unspecified: Secondary | ICD-10-CM

## 2012-05-26 DIAGNOSIS — I5032 Chronic diastolic (congestive) heart failure: Secondary | ICD-10-CM | POA: Diagnosis present

## 2012-05-26 DIAGNOSIS — E538 Deficiency of other specified B group vitamins: Secondary | ICD-10-CM

## 2012-05-26 DIAGNOSIS — IMO0001 Reserved for inherently not codable concepts without codable children: Secondary | ICD-10-CM | POA: Diagnosis present

## 2012-05-26 DIAGNOSIS — Z86718 Personal history of other venous thrombosis and embolism: Secondary | ICD-10-CM

## 2012-05-26 DIAGNOSIS — Z7901 Long term (current) use of anticoagulants: Secondary | ICD-10-CM

## 2012-05-26 DIAGNOSIS — I803 Phlebitis and thrombophlebitis of lower extremities, unspecified: Secondary | ICD-10-CM | POA: Diagnosis present

## 2012-05-26 DIAGNOSIS — E785 Hyperlipidemia, unspecified: Secondary | ICD-10-CM | POA: Diagnosis present

## 2012-05-26 DIAGNOSIS — M79609 Pain in unspecified limb: Secondary | ICD-10-CM

## 2012-05-26 DIAGNOSIS — H409 Unspecified glaucoma: Secondary | ICD-10-CM

## 2012-05-26 DIAGNOSIS — R222 Localized swelling, mass and lump, trunk: Secondary | ICD-10-CM | POA: Diagnosis present

## 2012-05-26 DIAGNOSIS — R918 Other nonspecific abnormal finding of lung field: Secondary | ICD-10-CM

## 2012-05-26 DIAGNOSIS — I82409 Acute embolism and thrombosis of unspecified deep veins of unspecified lower extremity: Secondary | ICD-10-CM | POA: Diagnosis present

## 2012-05-26 DIAGNOSIS — I10 Essential (primary) hypertension: Secondary | ICD-10-CM | POA: Diagnosis present

## 2012-05-26 DIAGNOSIS — Z8249 Family history of ischemic heart disease and other diseases of the circulatory system: Secondary | ICD-10-CM

## 2012-05-26 HISTORY — DX: Fibromyalgia: M79.7

## 2012-05-26 HISTORY — DX: Essential (primary) hypertension: I10

## 2012-05-26 HISTORY — DX: Peripheral vascular disease, unspecified: I73.9

## 2012-05-26 LAB — URINE MICROSCOPIC-ADD ON

## 2012-05-26 LAB — RAPID URINE DRUG SCREEN, HOSP PERFORMED
Amphetamines: NOT DETECTED
Benzodiazepines: NOT DETECTED
Cocaine: NOT DETECTED
Opiates: NOT DETECTED
Tetrahydrocannabinol: NOT DETECTED

## 2012-05-26 LAB — CBC WITH DIFFERENTIAL/PLATELET
Basophils Absolute: 0 10*3/uL (ref 0.0–0.1)
Basophils Relative: 0 % (ref 0–1)
Eosinophils Absolute: 0.1 10*3/uL (ref 0.0–0.7)
HCT: 43.8 % (ref 36.0–46.0)
Hemoglobin: 15.7 g/dL — ABNORMAL HIGH (ref 12.0–15.0)
MCH: 31.3 pg (ref 26.0–34.0)
MCHC: 35.8 g/dL (ref 30.0–36.0)
Monocytes Absolute: 0.4 10*3/uL (ref 0.1–1.0)
Monocytes Relative: 7 % (ref 3–12)
Neutro Abs: 4.6 10*3/uL (ref 1.7–7.7)
RDW: 13.4 % (ref 11.5–15.5)

## 2012-05-26 LAB — URINALYSIS, ROUTINE W REFLEX MICROSCOPIC
Glucose, UA: NEGATIVE mg/dL
Protein, ur: NEGATIVE mg/dL
Specific Gravity, Urine: 1.015 (ref 1.005–1.030)
pH: 7.5 (ref 5.0–8.0)

## 2012-05-26 LAB — COMPREHENSIVE METABOLIC PANEL
AST: 17 U/L (ref 0–37)
Albumin: 4.3 g/dL (ref 3.5–5.2)
BUN: 14 mg/dL (ref 6–23)
Calcium: 10.3 mg/dL (ref 8.4–10.5)
Creatinine, Ser: 0.88 mg/dL (ref 0.50–1.10)
Total Protein: 7.5 g/dL (ref 6.0–8.3)

## 2012-05-26 LAB — POCT I-STAT TROPONIN I

## 2012-05-26 LAB — PROTIME-INR: INR: 3.16 — ABNORMAL HIGH (ref 0.00–1.49)

## 2012-05-26 MED ORDER — PNEUMOCOCCAL VAC POLYVALENT 25 MCG/0.5ML IJ INJ
0.5000 mL | INJECTION | INTRAMUSCULAR | Status: AC
  Filled 2012-05-26: qty 0.5

## 2012-05-26 MED ORDER — WARFARIN - PHARMACIST DOSING INPATIENT: Freq: Every day | Status: DC

## 2012-05-26 MED ORDER — SODIUM CHLORIDE 0.9 % IV SOLN
INTRAVENOUS | Status: DC
  Administered 2012-05-26: 23:00:00 via INTRAVENOUS

## 2012-05-26 MED ORDER — HYDRALAZINE HCL 20 MG/ML IJ SOLN: 10.0000 mg | INTRAMUSCULAR | Status: DC | PRN

## 2012-05-26 NOTE — Consult Note (Signed)
Reason for Consult: diplopia Referring Physician: Doralene Glanz is an 66 y.o. female.  HPI: 66 year old female with history of recurrent DVT, on anticoagulation with Coumadin, which had intermittent episodes of double vision.  Her last few days, patient has had intermittent episodes of horizontal double vision. She's also noticed episode of right eye ptosis. She feels tension in straining with focusing. She feels like her eyes are crossed at times. Symptoms happens intermittently throughout the day. Episodes occur quite suddenly with onset, last for seconds or minutes at a time and spontaneously resolved. No vertigo, tinnitus, headache, extremity numbness or weakness. She has noted some right leg calf pain which is similar to previous DVT symptoms.  Past Medical History  Diagnosis Date  . DVT (deep venous thrombosis)     had 2 in right calf & 1 in the left calf  . Asthma   . Cataract   . Clotting disorder   . Anemia     Past Surgical History  Procedure Date  . Appendectomy   . Cholecystectomy   . Tonsillectomy   . Knee arthroscopy     right  . Laminectomy     lumbar  . Colonoscopy     11/28/09 repeat in 10 years    Family History  Problem Relation Age of Onset  . Anemia      FE deficiency   . Arthritis      family history  . Cancer      colon 1st degree relative <60  . Hypertension      family history  . Multiple sclerosis Sister     Social History:  reports that she has been smoking Cigarettes.  She has never used smokeless tobacco. She reports that she does not drink alcohol or use illicit drugs.  Allergies:  Allergies  Allergen Reactions  . Codeine Other (See Comments)    Extreme sleepiness  . Morphine Other (See Comments)    Extreme sleepiness   Medications:   . Warfarin - Pharmacist Dosing Inpatient   Does not apply q1800    Results for orders placed during the hospital encounter of 05/26/12 (from the past 48 hour(s))  CBC WITH  DIFFERENTIAL     Status: Abnormal   Collection Time   05/26/12  2:18 PM      Component Value Range Comment   WBC 6.5  4.0 - 10.5 K/uL    RBC 5.02  3.87 - 5.11 MIL/uL    Hemoglobin 15.7 (*) 12.0 - 15.0 g/dL    HCT 16.1  09.6 - 04.5 %    MCV 87.3  78.0 - 100.0 fL    MCH 31.3  26.0 - 34.0 pg    MCHC 35.8  30.0 - 36.0 g/dL    RDW 40.9  81.1 - 91.4 %    Platelets 267  150 - 400 K/uL    Neutrophils Relative 70  43 - 77 %    Neutro Abs 4.6  1.7 - 7.7 K/uL    Lymphocytes Relative 22  12 - 46 %    Lymphs Abs 1.4  0.7 - 4.0 K/uL    Monocytes Relative 7  3 - 12 %    Monocytes Absolute 0.4  0.1 - 1.0 K/uL    Eosinophils Relative 1  0 - 5 %    Eosinophils Absolute 0.1  0.0 - 0.7 K/uL    Basophils Relative 0  0 - 1 %    Basophils Absolute 0.0  0.0 - 0.1  K/uL   COMPREHENSIVE METABOLIC PANEL     Status: Abnormal   Collection Time   05/26/12  2:18 PM      Component Value Range Comment   Sodium 140  135 - 145 mEq/L    Potassium 3.6  3.5 - 5.1 mEq/L    Chloride 100  96 - 112 mEq/L    CO2 28  19 - 32 mEq/L    Glucose, Bld 100 (*) 70 - 99 mg/dL    BUN 14  6 - 23 mg/dL    Creatinine, Ser 1.61  0.50 - 1.10 mg/dL    Calcium 09.6  8.4 - 10.5 mg/dL    Total Protein 7.5  6.0 - 8.3 g/dL    Albumin 4.3  3.5 - 5.2 g/dL    AST 17  0 - 37 U/L    ALT 12  0 - 35 U/L    Alkaline Phosphatase 52  39 - 117 U/L    Total Bilirubin 0.2 (*) 0.3 - 1.2 mg/dL    GFR calc non Af Amer 66 (*) >90 mL/min    GFR calc Af Amer 77 (*) >90 mL/min   PROTIME-INR     Status: Abnormal   Collection Time   05/26/12  5:27 PM      Component Value Range Comment   Prothrombin Time 32.9 (*) 11.6 - 15.2 seconds    INR 3.16 (*) 0.00 - 1.49   URINALYSIS, ROUTINE W REFLEX MICROSCOPIC     Status: Abnormal   Collection Time   05/26/12  5:53 PM      Component Value Range Comment   Color, Urine YELLOW  YELLOW    APPearance CLEAR  CLEAR    Specific Gravity, Urine 1.015  1.005 - 1.030    pH 7.5  5.0 - 8.0    Glucose, UA NEGATIVE  NEGATIVE  mg/dL    Hgb urine dipstick SMALL (*) NEGATIVE    Bilirubin Urine NEGATIVE  NEGATIVE    Ketones, ur 15 (*) NEGATIVE mg/dL    Protein, ur NEGATIVE  NEGATIVE mg/dL    Urobilinogen, UA 0.2  0.0 - 1.0 mg/dL    Nitrite NEGATIVE  NEGATIVE    Leukocytes, UA TRACE (*) NEGATIVE   URINE RAPID DRUG SCREEN (HOSP PERFORMED)     Status: Normal   Collection Time   05/26/12  5:53 PM      Component Value Range Comment   Opiates NONE DETECTED  NONE DETECTED    Cocaine NONE DETECTED  NONE DETECTED    Benzodiazepines NONE DETECTED  NONE DETECTED    Amphetamines NONE DETECTED  NONE DETECTED    Tetrahydrocannabinol NONE DETECTED  NONE DETECTED    Barbiturates NONE DETECTED  NONE DETECTED   URINE MICROSCOPIC-ADD ON     Status: Abnormal   Collection Time   05/26/12  5:53 PM      Component Value Range Comment   Squamous Epithelial / LPF FEW (*) RARE    WBC, UA 0-2  <3 WBC/hpf    RBC / HPF 3-6  <3 RBC/hpf    Bacteria, UA RARE  RARE    Urine-Other MUCOUS PRESENT     POCT I-STAT TROPONIN I     Status: Normal   Collection Time   05/26/12  6:57 PM      Component Value Range Comment   Troponin i, poc 0.00  0.00 - 0.08 ng/mL    Comment 3  Lipid Panel     Component Value Date/Time   CHOL 187 06/13/2011 1456   TRIG 74.0 06/13/2011 1456   HDL 77.60 06/13/2011 1456   CHOLHDL 2 06/13/2011 1456   VLDL 14.8 06/13/2011 1456   LDLCALC 95 06/13/2011 1456   Ct Head Wo Contrast  05/26/2012  CT HEAD WITHOUT CONTRAST  Technique:  Contiguous axial images were obtained from the base of the skull through the vertex without contrast.  Comparison: None  Findings: The brain has a normal appearance without evidence for hemorrhage, infarction, hydrocephalus, or mass lesion.  There is no extra axial fluid collection.  The skull and paranasal sinuses are normal.  IMPRESSION:  1.  Negative exam.  Original Report Authenticated By: Rosealee Albee, M.D.  I reviewed images and agree. -VRP  Review of Systems  Constitutional:  Positive for malaise/fatigue.  HENT: Negative.   Eyes: Positive for double vision.  Respiratory: Negative.   Cardiovascular: Negative.   Gastrointestinal: Negative.   Genitourinary: Negative.   Musculoskeletal: Negative.   Skin: Negative.   Neurological: Negative.   Endo/Heme/Allergies: Negative.   Psychiatric/Behavioral: Negative.    Blood pressure 131/84, pulse 67, temperature 98.3 F (36.8 C), temperature source Oral, resp. rate 18, height 5' 5.5" (1.664 m), weight 65.1 kg (143 lb 8.3 oz), SpO2 98.00%. Physical Exam  GENERAL EXAM: Patient is in no distress  CARDIOVASCULAR: Regular rate and rhythm, no murmurs, no carotid bruits  NEUROLOGIC: MENTAL STATUS: awake, alert, language fluent, comprehension intact, naming intact CRANIAL NERVE: no papilledema on fundoscopic exam, pupils equal and reactive to light, visual fields full to confrontation, extraocular muscles intact, no nystagmus, facial sensation and strength symmetric, uvula midline, shoulder shrug symmetric, tongue midline. NO DIPLOPIA OR PTOSIS AFTER 1 MINUTE UPGAZE.  MOTOR: normal bulk and tone, full strength in the BUE, BLE SENSORY: normal and symmetric to light touch, pinprick, temperature, vibration. COORDINATION: finger-nose-finger, fine finger movements, heel-shin normal REFLEXES: deep tendon reflexes present and symmetric GAIT/STATION: narrow based gait; tandem normal; romberg is negative  Assessment/Plan: 66 year old female with recurrent DVT, now with intermittent episodes of subjective, binocular horizontal diplopia, intermittent right eye ptosis. Differential diagnosis: vertebrobasilar TIA, myasthenia gravis, thyroid ophthalmopathy  RECS: 1. CTA head/neck tonight 2. Check TSH, AchR antibody 3. MRI brain (w/wo), MRA head; may need IV sedation due to severe claustrophobia  [facility] Aayliah Rotenberry 05/26/2012, 10:59 PM

## 2012-05-26 NOTE — H&P (Signed)
[name] is an 66 y.o. female.   PCP - Dr.Stephen Clent Ridges. Chief Complaint: Diplopia. HPI: 66 year-old female with history of recurrent DVT on Coumadin, asthma, hypertension and hyperlipidemia has been experiencing diplopia since Saturday 3 days ago. She had 2 episodes on Saturday each of which lasted for 15 minutes. The first episode happened early in the morning and the second episode later in the evening on Saturday. She had a similar episode once on Sunday. These episodes were accompanied by feeling of instability. She did not fall or lose consciousness. Today she did not have any diplopia but did have some discomfort in the eye retro-orbital bilaterally. She did not have any tearing of the eye or did not lose vision. She still has instability and a feeling of falling when trying to walk. Denies any headache focal deficits palpitations difficulty speaking or swallowing. CT head was negative for any acute in the ER and will be admitted for further workup.  Past Medical History  Diagnosis Date  . DVT (deep venous thrombosis)     had 2 in right calf & 1 in the left calf  . Asthma   . Cataract   . Clotting disorder   . Anemia     Past Surgical History  Procedure Date  . Appendectomy   . Cholecystectomy   . Tonsillectomy   . Knee arthroscopy     right  . Laminectomy     lumbar  . Colonoscopy     11/28/09 repeat in 10 years    Family History  Problem Relation Age of Onset  . Anemia      FE deficiency   . Arthritis      family history  . Cancer      colon 1st degree relative <60  . Hypertension      family history  . Multiple sclerosis Sister    Social History:  reports that she has been smoking Cigarettes.  She has never used smokeless tobacco. She reports that she does not drink alcohol or use illicit drugs.  Allergies:  Allergies  Allergen Reactions  . Codeine Other (See Comments)    Extreme sleepiness  . Morphine Other (See Comments)    Extreme sleepiness      (Not in a hospital admission)  Results for orders placed during the hospital encounter of 05/26/12 (from the past 48 hour(s))  CBC WITH DIFFERENTIAL     Status: Abnormal   Collection Time   05/26/12  2:18 PM      Component Value Range Comment   WBC 6.5  4.0 - 10.5 K/uL    RBC 5.02  3.87 - 5.11 MIL/uL    Hemoglobin 15.7 (*) 12.0 - 15.0 g/dL    HCT 40.9  81.1 - 91.4 %    MCV 87.3  78.0 - 100.0 fL    MCH 31.3  26.0 - 34.0 pg    MCHC 35.8  30.0 - 36.0 g/dL    RDW 78.2  95.6 - 21.3 %    Platelets 267  150 - 400 K/uL    Neutrophils Relative 70  43 - 77 %    Neutro Abs 4.6  1.7 - 7.7 K/uL    Lymphocytes Relative 22  12 - 46 %    Lymphs Abs 1.4  0.7 - 4.0 K/uL    Monocytes Relative 7  3 - 12 %    Monocytes Absolute 0.4  0.1 - 1.0 K/uL    Eosinophils Relative 1  0 -  5 %    Eosinophils Absolute 0.1  0.0 - 0.7 K/uL    Basophils Relative 0  0 - 1 %    Basophils Absolute 0.0  0.0 - 0.1 K/uL   COMPREHENSIVE METABOLIC PANEL     Status: Abnormal   Collection Time   05/26/12  2:18 PM      Component Value Range Comment   Sodium 140  135 - 145 mEq/L    Potassium 3.6  3.5 - 5.1 mEq/L    Chloride 100  96 - 112 mEq/L    CO2 28  19 - 32 mEq/L    Glucose, Bld 100 (*) 70 - 99 mg/dL    BUN 14  6 - 23 mg/dL    Creatinine, Ser 1.61  0.50 - 1.10 mg/dL    Calcium 09.6  8.4 - 10.5 mg/dL    Total Protein 7.5  6.0 - 8.3 g/dL    Albumin 4.3  3.5 - 5.2 g/dL    AST 17  0 - 37 U/L    ALT 12  0 - 35 U/L    Alkaline Phosphatase 52  39 - 117 U/L    Total Bilirubin 0.2 (*) 0.3 - 1.2 mg/dL    GFR calc non Af Amer 66 (*) >90 mL/min    GFR calc Af Amer 77 (*) >90 mL/min   PROTIME-INR     Status: Abnormal   Collection Time   05/26/12  5:27 PM      Component Value Range Comment   Prothrombin Time 32.9 (*) 11.6 - 15.2 seconds    INR 3.16 (*) 0.00 - 1.49   URINALYSIS, ROUTINE W REFLEX MICROSCOPIC     Status: Abnormal   Collection Time   05/26/12  5:53 PM      Component Value Range Comment   Color, Urine  YELLOW  YELLOW    APPearance CLEAR  CLEAR    Specific Gravity, Urine 1.015  1.005 - 1.030    pH 7.5  5.0 - 8.0    Glucose, UA NEGATIVE  NEGATIVE mg/dL    Hgb urine dipstick SMALL (*) NEGATIVE    Bilirubin Urine NEGATIVE  NEGATIVE    Ketones, ur 15 (*) NEGATIVE mg/dL    Protein, ur NEGATIVE  NEGATIVE mg/dL    Urobilinogen, UA 0.2  0.0 - 1.0 mg/dL    Nitrite NEGATIVE  NEGATIVE    Leukocytes, UA TRACE (*) NEGATIVE   URINE RAPID DRUG SCREEN (HOSP PERFORMED)     Status: Normal   Collection Time   05/26/12  5:53 PM      Component Value Range Comment   Opiates NONE DETECTED  NONE DETECTED    Cocaine NONE DETECTED  NONE DETECTED    Benzodiazepines NONE DETECTED  NONE DETECTED    Amphetamines NONE DETECTED  NONE DETECTED    Tetrahydrocannabinol NONE DETECTED  NONE DETECTED    Barbiturates NONE DETECTED  NONE DETECTED   URINE MICROSCOPIC-ADD ON     Status: Abnormal   Collection Time   05/26/12  5:53 PM      Component Value Range Comment   Squamous Epithelial / LPF FEW (*) RARE    WBC, UA 0-2  <3 WBC/hpf    RBC / HPF 3-6  <3 RBC/hpf    Bacteria, UA RARE  RARE    Urine-Other MUCOUS PRESENT     POCT I-STAT TROPONIN I     Status: Normal   Collection Time   05/26/12  6:57 PM  Component Value Range Comment   Troponin i, poc 0.00  0.00 - 0.08 ng/mL    Comment 3             Ct Head Wo Contrast  05/26/2012  *RADIOLOGY REPORT*  Clinical Data: Diplopia and pain  CT HEAD WITHOUT CONTRAST  Technique:  Contiguous axial images were obtained from the base of the skull through the vertex without contrast.  Comparison: None  Findings: The brain has a normal appearance without evidence for hemorrhage, infarction, hydrocephalus, or mass lesion.  There is no extra axial fluid collection.  The skull and paranasal sinuses are normal.  IMPRESSION:  1.  Negative exam.  Original Report Authenticated By: Rosealee Albee, M.D.    Review of Systems  Constitutional: Negative.   HENT: Negative.   Eyes: Positive  for double vision.  Respiratory: Negative.   Cardiovascular: Negative.   Gastrointestinal: Negative.   Genitourinary: Negative.   Musculoskeletal:       Right calf muscle pain.  Skin: Negative.   Neurological:       Gait difficulties.  Endo/Heme/Allergies: Negative.   Psychiatric/Behavioral: Negative.     Blood pressure 131/80, pulse 66, temperature 98.1 F (36.7 C), temperature source Oral, resp. rate 18, SpO2 99.00%. Physical Exam  Constitutional: She is oriented to person, place, and time. She appears well-developed and well-nourished. No distress.  HENT:  Head: Normocephalic and atraumatic.  Right Ear: External ear normal.  Left Ear: External ear normal.  Nose: Nose normal.  Mouth/Throat: Oropharynx is clear and moist. No oropharyngeal exudate.  Eyes: Conjunctivae are normal. Pupils are equal, round, and reactive to light. Right eye exhibits no discharge. Left eye exhibits no discharge. No scleral icterus.  Neck: Normal range of motion. Neck supple.  Cardiovascular: Normal rate and regular rhythm.   Respiratory: Effort normal and breath sounds normal. No respiratory distress. She has no wheezes. She has no rales.  GI: Soft. Bowel sounds are normal. She exhibits no distension. There is no tenderness. There is no rebound.  Musculoskeletal: Normal range of motion. She exhibits no edema and no tenderness.  Neurological: She is alert and oriented to person, place, and time.       Moves all extremities.  Skin: Skin is warm and dry. She is not diaphoretic.  Psychiatric: Her behavior is normal.     Assessment/Plan #1. Diplopia and gait instability with retro-orbital pain - I have discussed with on-call neurologist Dr.Penumalli. Differentials include ruling out CVA and also multiple sclerosis. Patient's twin sister does have multiple sclerosis. Patient is very claustrophobic and scared of getting MRI brain. At this time patient will be placed on neurochecks, swallow evaluation. CT  angiography of the head and neck and 2-D echo has been ordered. Monitor shows sinus rhythm. Further recommendation based on neurologist. #2. Right calf pain - patient recently experienced some right calf pain. This was not exertional. Presently no calf pain. She was already scheduled to get Doppler of the right lower extremity by her PCP tomorrow. Given her smoking history I have ordered also in addition to venous Doppler also arterial Doppler. #3. History of recurrent DVT and coagulation disorder on Coumadin - continue Coumadin per pharmacy. #4. Hyperlipidemia - continue statins. Verify dose and continue. #5. History of hypertension - holding off HCTZ for now. When necessary IV hydralazine for systolic blood pressure more than 180.  CODE STATUS - full code.  Eduard Clos 05/26/2012, 9:18 PM

## 2012-05-26 NOTE — ED Notes (Signed)
Pt informed urine is needed for testing, sts she will try to give a sample soon.

## 2012-05-26 NOTE — ED Notes (Signed)
Pt sts bilateral eye pressure with pain starting Saturday with double vision that went away; pt sts happened 3 times Saturday, once Sunday and sts feels lightheaded today and bilateral eye pressure today; pt sts feels off balance

## 2012-05-26 NOTE — ED Notes (Signed)
Pt presented to ED with on and off double vision,impaired vision since 2 days.

## 2012-05-26 NOTE — ED Notes (Signed)
Consulted with Dr Effie Shy about protocol order

## 2012-05-26 NOTE — Progress Notes (Signed)
ANTICOAGULATION CONSULT NOTE - Initial Consult  Pharmacy Consult for warfarin Indication: DVT  Allergies  Allergen Reactions  . Codeine Other (See Comments)    Extreme sleepiness  . Morphine Other (See Comments)    Extreme sleepiness    Vital Signs: Temp: 97.1 F (36.2 C) (07/01 1358) Temp src: Oral (07/01 1358) BP: 124/89 mmHg (07/01 1900) Pulse Rate: 71  (07/01 1900)  Labs:  Basename 05/26/12 1727 05/26/12 1418  HGB -- 15.7*  HCT -- 43.8  PLT -- 267  APTT -- --  LABPROT 32.9* --  INR 3.16* --  HEPARINUNFRC -- --  CREATININE -- 0.88  CKTOTAL -- --  CKMB -- --  TROPONINI -- --    The CrCl is unknown because both a height and weight (above a minimum accepted value) are required for this calculation.   Medical History: Past Medical History  Diagnosis Date  . DVT (deep venous thrombosis)     had 2 in right calf & 1 in the left calf  . Asthma   . Cataract   . Clotting disorder   . Anemia    Assessment: 66 yof on chronic coumadin for history of DVT. INR today is slightly supratherapeutic. Patient already took her dose for today. CBC is ok. No bleeding noted.   Goal of Therapy:  INR 2-3 Monitor platelets by anticoagulation protocol: Yes   Plan:  1. No further coumadin today 2. Daily INR  Ahmeer Tuman, Drake Leach 05/26/2012,8:35 PM

## 2012-05-26 NOTE — ED Provider Notes (Signed)
History     CSN: [id number]  Arrival date & time 05/26/12  1342   First MD Initiated Contact with Patient 05/26/12 1704      Chief Complaint  Patient presents with  . Diplopia  . Eye Pain    (Consider location/radiation/quality/duration/timing/severity/associated sxs/prior treatment) Patient is a 66 y.o. female presenting with eye problem. The history is provided by the patient.  Eye Problem  This is a new problem. The current episode started more than 2 days ago. The problem occurs daily. The problem has been gradually worsening. There is pain in both eyes. Pertinent negatives include no numbness, no photophobia and no weakness.  Pt states for last two days, she has had intermittent double vision and ataxia lasting about at a time. States two episodes yesterday and Saturday, however, today, she is having intermittnet eye pressure, truoble focusing, trouble with balance, that has happened more than 10 times. Pt states when she covers one eye, vision is normal. Denies nausea, vomiting, fever, head injury. No difficulty moving extremities, no numbness or weakness in his   Past Medical History  Diagnosis Date  . DVT (deep venous thrombosis)     had 2 in right calf & 1 in the left calf  . Asthma   . Cataract   . Clotting disorder   . Anemia     Past Surgical History  Procedure Date  . Appendectomy   . Cholecystectomy   . Tonsillectomy   . Knee arthroscopy     right  . Laminectomy     lumbar  . Colonoscopy     11/28/09 repeat in 10 years    Family History  Problem Relation Age of Onset  . Anemia      FE deficiency   . Arthritis      family history  . Cancer      colon 1st degree relative <60  . Hypertension      family history    History  Substance Use Topics  . Smoking status: Current Everyday Smoker    Types: Cigarettes  . Smokeless tobacco: Never Used   Comment: 3 cigarettes a day  . Alcohol Use: No    OB History    Grav Para Term Preterm Abortions  TAB SAB Ect Mult Living                  Review of Systems  Constitutional: Negative for fever and chills.  HENT: Negative for neck pain and neck stiffness.   Eyes: Positive for pain and visual disturbance. Negative for photophobia.  Respiratory: Negative.   Cardiovascular: Negative.   Genitourinary: Negative for dysuria.  Musculoskeletal: Positive for gait problem.  Skin: Negative.   Neurological: Positive for dizziness. Negative for weakness, light-headedness, numbness and headaches.    Allergies  Codeine and Morphine  Home Medications   Current Outpatient Rx  Name Route Sig Dispense Refill  . ACETAMINOPHEN 500 MG PO TABS Oral Take 500 mg by mouth every 6 (six) hours as needed. For pain    . CALCIUM CARBONATE 600 MG PO TABS Oral Take 600 mg by mouth daily. + 600 mg Vit D + 400 mg of Magnesium     . CYANOCOBALAMIN IJ Injection Inject 1 application as directed every 30 (thirty) days. Around the 22nd or 23rd of each month    . HYDROCHLOROTHIAZIDE 25 MG PO TABS Oral Take 25 mg by mouth daily.    Marland Kitchen POTASSIUM 99 MG PO TABS Oral Take 1 tablet by  mouth daily.      Marland Kitchen SIMVASTATIN 20 MG PO TABS  TAKE 1 TABLET BY MOUTH EVERY DAY 30 tablet 11  . WARFARIN SODIUM 5 MG PO TABS Oral Take 7.5-10 mg by mouth daily. 10 mg on Monday and Friday, 7.5 mg all other days      BP 137/80  Pulse 66  Temp 97.1 F (36.2 C) (Oral)  Resp 17  SpO2 99%  Physical Exam  Nursing note and vitals reviewed. Constitutional: She is oriented to person, place, and time. She appears well-developed and well-nourished. No distress.  HENT:  Head: Normocephalic and atraumatic.  Right Ear: External ear normal.  Left Ear: External ear normal.  Nose: Nose normal.  Mouth/Throat: Oropharynx is clear and moist.  Eyes: Conjunctivae are normal. Pupils are equal, round, and reactive to light.  Neck: Normal range of motion. Neck supple.  Cardiovascular: Normal rate, regular rhythm and normal heart sounds.     Pulmonary/Chest: Effort normal and breath sounds normal. No respiratory distress. She has no wheezes. She has no rales.  Abdominal: Soft. Bowel sounds are normal. She exhibits no distension. There is no tenderness. There is no rebound.  Musculoskeletal: Normal range of motion.  Neurological: She is alert and oriented to person, place, and time. She has normal reflexes. No cranial nerve deficit. Coordination normal.       Normal gait, normal finger to nose, no pronator drift, 5/5 and equal upper and LE strength bilaterally  Skin: Skin is warm and dry.  Psychiatric: She has a normal mood and affect.    ED Course  Procedures (including critical care time)  Labs Reviewed  CBC WITH DIFFERENTIAL - Abnormal; Notable for the following:    Hemoglobin 15.7 (*)     All other components within normal limits  COMPREHENSIVE METABOLIC PANEL - Abnormal; Notable for the following:    Glucose, Bld 100 (*)     Total Bilirubin 0.2 (*)     GFR calc non Af Amer 66 (*)     GFR calc Af Amer 77 (*)     All other components within normal limits  URINALYSIS, ROUTINE W REFLEX MICROSCOPIC  URINE RAPID DRUG SCREEN (HOSP PERFORMED)  PROTIME-INR   Ct Head Wo Contrast  05/26/2012  *RADIOLOGY REPORT*  Clinical Data: Diplopia and pain  CT HEAD WITHOUT CONTRAST  Technique:  Contiguous axial images were obtained from the base of the skull through the vertex without contrast.  Comparison: None  Findings: The brain has a normal appearance without evidence for hemorrhage, infarction, hydrocephalus, or mass lesion.  There is no extra axial fluid collection.  The skull and paranasal sinuses are normal.  IMPRESSION:  1.  Negative exam.  Original Report Authenticated By: Rosealee Albee, M.D.   Pt with hx of clotting disorder, on coumadin. Here with TIA type symptoms. No prior hx of strokes or work up. Pt currently symptom free. CT negative. Refused MRI bc claustrophobic. Will admit for further work up.   1. Diplopia   2.  Ataxia       MDM          Lottie Mussel, PA 05/27/12 0134

## 2012-05-27 ENCOUNTER — Inpatient Hospital Stay (HOSPITAL_COMMUNITY): Payer: Medicare Other

## 2012-05-27 ENCOUNTER — Encounter (HOSPITAL_COMMUNITY): Payer: Self-pay | Admitting: Radiology

## 2012-05-27 DIAGNOSIS — E876 Hypokalemia: Secondary | ICD-10-CM

## 2012-05-27 LAB — RAPID URINE DRUG SCREEN, HOSP PERFORMED: Opiates: NOT DETECTED

## 2012-05-27 LAB — HEMOGLOBIN A1C
Hgb A1c MFr Bld: 5.9 % — ABNORMAL HIGH (ref <5.7)
Mean Plasma Glucose: 123 mg/dL — ABNORMAL HIGH (ref <117)

## 2012-05-27 LAB — LIPID PANEL
Cholesterol: 166 mg/dL (ref 0–200)
HDL: 61 mg/dL (ref >39)
Total CHOL/HDL Ratio: 2.7 RATIO
Triglycerides: 87 mg/dL (ref <150)

## 2012-05-27 LAB — CBC
HCT: 41 % (ref 36.0–46.0)
MCHC: 34.4 g/dL (ref 30.0–36.0)
RDW: 13.2 % (ref 11.5–15.5)
WBC: 5.5 10*3/uL (ref 4.0–10.5)

## 2012-05-27 LAB — COMPREHENSIVE METABOLIC PANEL
ALT: 11 U/L (ref 0–35)
Alkaline Phosphatase: 41 U/L (ref 39–117)
BUN: 9 mg/dL (ref 6–23)
CO2: 26 mEq/L (ref 19–32)
Calcium: 9.3 mg/dL (ref 8.4–10.5)
GFR calc Af Amer: >90 mL/min (ref >90)
GFR calc non Af Amer: 85 mL/min — ABNORMAL LOW (ref >90)
Glucose, Bld: 100 mg/dL — ABNORMAL HIGH (ref 70–99)
Sodium: 138 mEq/L (ref 135–145)
Total Protein: 6.4 g/dL (ref 6.0–8.3)

## 2012-05-27 LAB — PROTIME-INR
INR: 3.99 — ABNORMAL HIGH (ref 0.00–1.49)
Prothrombin Time: 39.5 seconds — ABNORMAL HIGH (ref 11.6–15.2)

## 2012-05-27 LAB — GLUCOSE, CAPILLARY: Glucose-Capillary: 108 mg/dL — ABNORMAL HIGH (ref 70–99)

## 2012-05-27 MED ORDER — MIDAZOLAM HCL 2 MG/2ML IJ SOLN
INTRAMUSCULAR | Status: AC
  Filled 2012-05-27: qty 10

## 2012-05-27 MED ORDER — SALINE SPRAY 0.65 % NA SOLN
1.0000 | NASAL | Status: DC | PRN
  Filled 2012-05-27: qty 44

## 2012-05-27 MED ORDER — LORAZEPAM 1 MG PO TABS
1.0000 mg | ORAL_TABLET | Freq: Once | ORAL | Status: AC
  Administered 2012-05-27: 1 mg via ORAL
  Filled 2012-05-27: qty 1

## 2012-05-27 MED ORDER — MIDAZOLAM HCL 2 MG/2ML IJ SOLN
1.0000 mg | INTRAMUSCULAR | Status: DC | PRN
  Administered 2012-05-27: 2 mg via INTRAVENOUS
  Administered 2012-05-27: 1 mg via INTRAVENOUS
  Filled 2012-05-27: qty 10

## 2012-05-27 MED ORDER — LORAZEPAM 2 MG/ML IJ SOLN: 1.0000 mg | Freq: Once | INTRAMUSCULAR | Status: DC

## 2012-05-27 MED ORDER — IOHEXOL 300 MG/ML  SOLN
80.0000 mL | Freq: Once | INTRAMUSCULAR | Status: AC | PRN
  Administered 2012-05-27: 80 mL via INTRAVENOUS

## 2012-05-27 MED ORDER — HYDROCHLOROTHIAZIDE 25 MG PO TABS
25.0000 mg | ORAL_TABLET | Freq: Every day | ORAL | Status: DC
  Administered 2012-05-28 – 2012-05-29 (×2): 25 mg via ORAL
  Filled 2012-05-27 (×3): qty 1

## 2012-05-27 MED ORDER — FENTANYL CITRATE 0.05 MG/ML IJ SOLN
INTRAMUSCULAR | Status: AC
  Filled 2012-05-27: qty 4

## 2012-05-27 MED ORDER — IOHEXOL 350 MG/ML SOLN
50.0000 mL | Freq: Once | INTRAVENOUS | Status: AC | PRN
  Administered 2012-05-27: 50 mL via INTRAVENOUS

## 2012-05-27 MED ORDER — FENTANYL CITRATE 0.05 MG/ML IJ SOLN
25.0000 ug | INTRAMUSCULAR | Status: DC | PRN
  Administered 2012-05-27: 50 ug via INTRAVENOUS
  Administered 2012-05-27: 25 ug via INTRAVENOUS

## 2012-05-27 MED ORDER — IOHEXOL 300 MG/ML  SOLN: 80.0000 mL | Freq: Once | INTRAMUSCULAR | Status: DC | PRN

## 2012-05-27 MED ORDER — POTASSIUM CHLORIDE CRYS ER 20 MEQ PO TBCR
40.0000 meq | EXTENDED_RELEASE_TABLET | Freq: Once | ORAL | Status: AC
  Administered 2012-05-27: 40 meq via ORAL
  Filled 2012-05-27: qty 2

## 2012-05-27 MED ORDER — SIMVASTATIN 20 MG PO TABS
20.0000 mg | ORAL_TABLET | Freq: Every day | ORAL | Status: DC
  Administered 2012-05-27 – 2012-05-29 (×3): 20 mg via ORAL
  Filled 2012-05-27 (×4): qty 1

## 2012-05-27 MED ORDER — ACETAMINOPHEN 325 MG PO TABS: 650.0000 mg | ORAL_TABLET | Freq: Four times a day (QID) | ORAL | Status: DC | PRN

## 2012-05-27 MED ORDER — WARFARIN SODIUM 5 MG PO TABS
5.0000 mg | ORAL_TABLET | Freq: Once | ORAL | Status: DC
  Filled 2012-05-27: qty 1

## 2012-05-27 NOTE — ED Notes (Signed)
Patient to have a CT of chest prior to Going back to room. Patient is awake from sedation.

## 2012-05-27 NOTE — Progress Notes (Signed)
OT Cancellation Note  Treatment cancelled today due to:  Ot attempting eval and 2 D echo in process in room. OT attempting eval and pt now off the floor for testing. OT to continue to follow acutely to evaluate when appropriate.    Harrel Carina [place]   OTR/L Pager: [phone] Office: [phone] .

## 2012-05-27 NOTE — Progress Notes (Signed)
  Echocardiogram 2D Echocardiogram has been performed.  [place] 05/27/2012, 10:51 AM

## 2012-05-27 NOTE — Progress Notes (Signed)
Request received for RML lung lesion needle biopsy - reviewed CT scan - amendable to percutaneous needle biopsy - however; INR =3.99 - must be < 1.5 to proceed with same. Dr. Bonnielee Haff to talk with Dr. Waymon Amato in regards to same.

## 2012-05-27 NOTE — ED Notes (Addendum)
MRI scan began

## 2012-05-27 NOTE — Progress Notes (Addendum)
Subjective:   Chart reviewed. Patient denies any further double vision since last night. She indicates she feels some pressure in her eyes. No slurred speech or asymmetric weakness. Denies cough, or dyspnea or chest pain. History of heavy smoking- quit a couple of months ago.  Objective  Vital signs in last 24 hours: Filed Vitals:   05/27/12 1230 05/27/12 1236 05/27/12 1245 05/27/12 1300  BP: 109/66 105/74 118/81   Pulse: 64 64 66 76  Temp:   98.2 F (36.8 C)   TempSrc:   Oral   Resp: 14 13 13    Height:      Weight:      SpO2: 100% 100% 99% 100%   Weight change:  No intake or output data in the 24 hours ending 05/27/12 1617  Physical Exam:  General Exam: Comfortable.  Respiratory System: Clear. No increased work of breathing.  Cardiovascular System: First and second heart sounds heard. Regular rate and rhythm. No JVD/murmurs. Telemetry shows sinus bradycardia in the 50s to sinus rhythm in the 60s. Gastrointestinal System: Abdomen is non distended, soft and normal bowel sounds heard. Non-tender Central Nervous System: Alert and oriented. No focal neurological deficits. Extremities: Symmetric 5 x 5 power.  Labs:  Basic Metabolic Panel:  Lab 05/27/12 1610 05/26/12 1418  NA 138 140  K 3.2* 3.6  CL 103 100  CO2 26 28  GLUCOSE 100* 100*  BUN 9 14  CREATININE 0.76 0.88  CALCIUM 9.3 10.3  ALB -- --  PHOS -- --   Liver Function Tests:  Lab 05/27/12 0520 05/26/12 1418  AST 14 17  ALT 11 12  ALKPHOS 41 52  BILITOT 0.3 0.2*  PROT 6.4 7.5  ALBUMIN 3.6 4.3   No results found for this basename: LIPASE:3,AMYLASE:3 in the last 168 hours No results found for this basename: AMMONIA:3 in the last 168 hours CBC:  Lab 05/27/12 0520 05/26/12 1418  WBC 5.5 6.5  NEUTROABS -- 4.6  HGB 14.1 15.7*  HCT 41.0 43.8  MCV 87.0 87.3  PLT 228 267   Cardiac Enzymes: No results found for this basename: CKTOTAL:5,CKMB:5,CKMBINDEX:5,TROPONINI:5 in the last 168 hours CBG:  Lab  05/27/12 1319 05/27/12 0119  GLUCAP 70 108*    Iron Studies: No results found for this basename: IRON,TIBC,TRANSFERRIN,FERRITIN in the last 72 hours Studies/Results: Ct Angio Head W/cm &/or Wo Cm  05/27/2012  *RADIOLOGY REPORT*  Clinical Data:  DIPLOPIA.  PAIN.  UNFOCUSED VISION.  PTOSIS.  CT ANGIOGRAPHY HEAD AND NECK  Technique:  Multidetector CT imaging of the head and neck was performed using the standard protocol during bolus administration of intravenous contrast.  Multiplanar CT image reconstructions including MIPs were obtained to evaluate the vascular anatomy. Carotid stenosis measurements (when applicable) are obtained utilizing NASCET criteria, using the distal internal carotid diameter as the denominator.  Contrast: 50mL OMNIPAQUE IOHEXOL 350 MG/ML SOLN,  Comparison:  CT head without contrast 05/26/2012.  CTA NECK  Findings:  A standard three-vessel arch configuration is present. The origin of the right vertebral artery is duplicated.  There is one segment which enters at to C6-7 and a second segment, slightly more anteriorly, which enters at C5-6.  There are no significant stenoses.  The right vertebral artery is the nondominant vessel. The left vertebral artery originates from the left subclavian artery without significant stenosis.  It is the dominant vessel.  The right common carotid artery is within normal limits.  The bifurcation is unremarkable.  The cervical right ICA is normal.  The left common carotid artery is within normal limits.  The bifurcation is unremarkable.  The left cervical ICA is normal.  Right paratracheal adenopathy is evident.  A nodal mass measures 2.1 x 3.0.  It is at least 3.1 cm in cephalocaudad dimension.  The disease is incompletely imaged.  No significant axillary adenopathy is present.  There are small supraclavicular nodes on the left. The largest measures 11 x 8 mm with somewhat indistinct margins, raising concern for an metastasis.  The lung apices are clear.   Bone windows demonstrate chronic end plate degenerative changes at C5-6 and C6-7.  Osseous foraminal narrowing is present on the right at C6-7.   Review of the MIP images confirms the above findings.  IMPRESSION:  1.  Right paratracheal adenopathy with a nodal mass measuring 3.0 x 2.1 by at least 3.1 cm. 2.  Additional suspicious left supraclavicular lymph node. 3.  Normal variant duplicated origin of the nondominant right vertebral artery. 4.  No significant vascular pathology in the neck. 5.  Mild cervical spondylosis.  CTA HEAD  Findings:   The postcontrast images of the brain demonstrate no pathologic enhancement.  The ventricles are of normal size.  No significant extra-axial fluid collection is present.  The left internal carotid artery is within normal limits at the skull base. There is slight fusiform dilation of the right internal carotid artery within the proximal cavernous segment.  The A1 and M1 segments are normal.  There is an early bifurcation of the right middle cerebral artery, a normal variant.  The MCA bifurcations are within normal limits.  The ACA and MCA branch vessels are unremarkable.  The left vertebral artery is the dominant vessel.  Both PICA origins are visualized and within normal limits.  The nondominant right vertebral artery is hypoplastic beyond the PICA.  The posterior cerebral arteries both originate from basilar tip.  A small left posterior communicating artery is present.  The PCA branch vessels are within normal limits.  The dural sinuses fill normally.  The source images demonstrate no focal enhancing lesions or areas of acute ischemia.   Review of the MIP images confirms the above findings.  IMPRESSION:  1.  Slight fusiform dilation of the cavernous right internal carotid artery without a distinct aneurysm. 2.  Otherwise normal CT head.  These results were called by telephone on 05/27/2012  at  08:10 a.m. to  Dr. Dr. Waymon Amato, who verbally acknowledged these results.  Original  Report Authenticated By: Jamesetta Orleans. MATTERN, M.D.   Dg Chest 2 View  05/27/2012  *RADIOLOGY REPORT*  Clinical Data: Rule out mass.  CHEST - 2 VIEW  Comparison: 05/28/2006  Findings: Airspace opacity noted in the right midlung.  This could reflect atelectasis or pneumonia.  On the lateral view, there is suggestion of a rounded area of more centrally.  Recommend chest CT to exclude central obstructing lesion with postobstructive process. Left lung is clear.  Heart is normal size.  No effusions or acute bony abnormality.  IMPRESSION: Airspace opacity in the right midlung.  Cannot exclude central obstructing lesion given the central rounded appearance on the lateral view.  Recommend chest CT with IV contrast for further evaluation.  Original Report Authenticated By: Cyndie Chime, M.D.   Ct Head Wo Contrast  05/26/2012  *RADIOLOGY REPORT*  Clinical Data: Diplopia and pain  CT HEAD WITHOUT CONTRAST  Technique:  Contiguous axial images were obtained from the base of the skull through the vertex without contrast.  Comparison: None  Findings: The brain has a normal appearance without evidence for hemorrhage, infarction, hydrocephalus, or mass lesion.  There is no extra axial fluid collection.  The skull and paranasal sinuses are normal.  IMPRESSION:  1.  Negative exam.  Original Report Authenticated By: Rosealee Albee, M.D.    Ct Chest W Contrast  05/27/2012  *RADIOLOGY REPORT*  Clinical Data: Right lung mass.  Mediastinal adenopathy.  CT CHEST WITH CONTRAST  Technique:  Multidetector CT imaging of the chest was performed following the standard protocol during bolus administration of intravenous contrast.  Contrast: 80mL OMNIPAQUE IOHEXOL 300 MG/ML  SOLN  Comparison: Chest x-ray dated 05/26/2012  Findings: The patient has a tumor at the confluence of the right upper, middle, and lower lobes.  The tumor measures 3.6 x 2.7 x 1.8 cm.  The tumors primarily in the middle lobe but there is a metastatic lymph node in  the hilum of the right lower lobe measuring 1.4 x 1.0 x 0.8 cm.  In addition, there is a 4 mm metastatic nodule in the right upper lobe with the metastatic lymph nodes in the hilum of the right upper lobe measuring 1.2 x 1.4 x 1.0 cm.  There is mediastinal adenopathy with a 4.4 x 2.8 x 1.8 cm azygos lymph nodes and precarinal adenopathy.  There is also a 1.0 x 1.2cm.  lymph node anterior to the superior vena cava adjacent to the ascending thoracic aorta.  The left lung is clear.  No effusions.  Heart size is normal.  No significant osseous abnormality.  The visualized portion of the upper abdomen is normal except for findings of previous cholecystectomy.  IMPRESSION:  1. Tumor in the right middle lobe with spreading of the tumor into the right upper lobe and into the right lower lobe as described. 2.  Mediastinal and hilar adenopathy consistent with metastatic disease.  Original Report Authenticated By: Gwynn Burly, M.D.   Mr Laqueta Jean Wo Contrast  05/27/2012  **ADDENDUM** CREATED: 05/27/2012 15:32:10  Impression:  No intracranial enhancing lesion or bony destructive lesion to suggest presence of intracranial metastatic disease.  No acute infarct noted.  8 mm polypoid lesion in the left posterior-superior nasopharynx. Although this may represent a Thornwaldt cyst, mucosal abnormality not excluded.  Please see above.  **END ADDENDUM** SIGNED BY: Almedia Balls. Constance Goltz, M.D.   05/27/2012  *RADIOLOGY REPORT*  Clinical Data: Diplopia which has resolved. Chest mass/adenopathy.  MRI HEAD WITHOUT AND WITH CONTRAST  Technique:  Multiplanar, multiecho pulse sequences of the brain and surrounding structures were obtained according to standard protocol without and with intravenous contrast  Contrast:  13 ml MultiHance  Comparison: 05/27/2012 CT angiogram.  No comparison brain MR.  Findings: Slight asymmetry of the superior ophthalmic vein with the right side larger than the left however, no findings of cavernous sinus abnormality  detected as a cause for this finding.  Remainder of the orbital structures appear unremarkable.  No intracranial enhancing lesion or bony destructive lesion to suggest presence of intracranial metastatic disease.  Artifact mid brain on diffusion sequence without discrete acute infarct noted.  No intracranial hemorrhage.  Minimal nonspecific white matter type changes.  Mild global atrophy without hydrocephalus.  Major intracranial vascular structures are patent.  Polypoid opacification right frontal sinus with mild mucosal thickening ethmoid sinus air cells.  8 mm polypoid lesion in the left posterior-superior nasopharynx. Although this may represent a Thornwaldt cyst, mucosal abnormality not excluded.  No intracranial enhancing lesion or bony destructive lesion to suggest presence  of intracranial metastatic disease.  No acute infarct noted.  8 mm polypoid lesion in the left posterior-superior nasopharynx. Although this may represent a Thornwaldt cyst, mucosal abnormality not excluded.  Please see above. Original Report Authenticated By: Fuller Canada, M.D.   Medications:    . sodium chloride 100 mL/hr at 05/26/12 2325      . fentaNYL      . LORazepam  1 mg Intravenous Once  . LORazepam  1 mg Oral Once  . midazolam      . pneumococcal 23 valent vaccine  0.5 mL Intramuscular Tomorrow-1000  . potassium chloride  40 mEq Oral Once  . warfarin  5 mg Oral ONCE-1800  . Warfarin - Pharmacist Dosing Inpatient   Does not apply q1800    I  have reviewed scheduled and prn medications.     Problem/Plan: Principal Problem:  *Diplopia Active Problems:  HYPERLIPIDEMIA  HYPERTENSION  DEEP VENOUS THROMBOPHLEBITIS, RECURRENT  1. Right lung mass, suspicious for metastatic bronchogenic carcinoma: Biopsy by interventional radiology, once INR has improved to less than 1.5. Patient would prefer Dr. Truett Perna as her oncologist- call when appropriate. 2. Hypokalemia: Replete and follow BMP. 3. Recurrent DVT:  Supratherapeutic INR. As per discussion above, will hold Coumadin and let INR trended down by itself in preparation for biopsy of lung mass. We'll need to bridge with heparin when INR is less than 2. Discussed with pharmacy. 4. Hypertension: Controlled. 5. Diplopia: Unclear etiology. No significant findings on MRI brain. Neurology consultation appreciated.  Discussed with patient and patient's twin sister at the bedside. Updated care and answered questions.  Subsequently went to the patient's room and discussed in detail with patient, her son Jillyn Hidden and daughter Misty Stanley at the bedside and updated care and answered questions.  Castle Lamons 05/27/2012,4:17 PM  LOS: 1 day

## 2012-05-27 NOTE — Progress Notes (Signed)
[facility] PROGRESS NOTE    SUBJECTIVE   Patient has had no further episodes of diplopia or decreased vision. She is feeling baseline at present time. Last double vision event was yesterday.   OBJECTIVE   Vital signs in last 24 hours: Temp:  [97.1 F (36.2 C)-98.3 F (36.8 C)] 97.7 F (36.5 C) (07/02 0500) Pulse Rate:  [66-79] 66  (07/02 0500) Resp:  [17-20] 18  (07/02 0500) BP: (98-137)/(66-92) 98/66 mmHg (07/02 0500) SpO2:  [98 %-100 %] 98 % (07/02 0500) Weight:  [65.1 kg (143 lb 8.3 oz)] 65.1 kg (143 lb 8.3 oz) (07/01 2130)  Intake/Output from previous day:   Intake/Output this shift:   Nutritional status: General  Past Medical History  Diagnosis Date  . DVT (deep venous thrombosis)     had 2 in right calf & 1 in the left calf  . Cataract   . Clotting disorder   . Anemia   . Hypertension   . Peripheral vascular disease   . Fibromyalgia     Neurologic Exam:   Mental Status: Alert, oriented X 3.  Speech fluent without evidence of aphasia. Able to follow 3 step commands without difficulty. Mood:upbeeat Memory:normal Thought content appropriate Cranial Nerves: II-Visual fields grossly intact. III/IV/VI-Extraocular movements intact.  Pupils reactive bilaterally. Ptosis not present. V/VII-Smile symmetric VIII-grossly intact IX/X-normal gag XI-bilateral shoulder shrug XII-midline tongue extension Motor: 5/5 bilaterally with normal tone and bulk Sensory: Pinprick and light touch intact throughout, bilaterally Deep Tendon Reflexes: 2+ and symmetric throughout.     Plantars:      Right:  downgoing     Left:  Downgoing Cerebellar: Normal finger-to-nose, normal rapid alternating movements and normal heel-to-shin test.   Gait: Normal gait and station.  Lab Results: Lab Results  Component Value Date/Time   CHOL 166 05/27/2012  5:20 AM   Lipid Panel  Basename 05/27/12 0520  CHOL 166  TRIG 87  HDL 61  CHOLHDL  2.7  VLDL 17  LDLCALC 88    Studies/Results: Ct Angio Head W/cm &/or Wo Cm  05/27/2012  *RADIOLOGY REPORT*  Clinical Data:  DIPLOPIA.  PAIN.  UNFOCUSED VISION.  PTOSIS.  CT ANGIOGRAPHY HEAD AND NECK  Technique:  Multidetector CT imaging of the head and neck was performed using the standard protocol during bolus administration of intravenous contrast.  Multiplanar CT image reconstructions including MIPs were obtained to evaluate the vascular anatomy. Carotid stenosis measurements (when applicable) are obtained utilizing NASCET criteria, using the distal internal carotid diameter as the denominator.  Contrast: 50mL OMNIPAQUE IOHEXOL 350 MG/ML SOLN,  Comparison:  CT head without contrast 05/26/2012.  CTA NECK  Findings:  A standard three-vessel arch configuration is present. The origin of the right vertebral artery is duplicated.  There is one segment which enters at to C6-7 and a second segment, slightly more anteriorly, which enters at C5-6.  There are no significant stenoses.  The right vertebral artery is the nondominant vessel. The left vertebral artery originates from the left subclavian artery without significant stenosis.  It is the dominant vessel.  The right common carotid artery is within normal limits.  The bifurcation is unremarkable.  The cervical right ICA is normal.  The left common carotid artery is within normal limits.  The bifurcation is unremarkable.  The left cervical ICA is normal.  Right paratracheal adenopathy is evident.  A nodal mass measures 2.1 x 3.0.  It is at least 3.1 cm in cephalocaudad dimension.  The disease is incompletely imaged.  No significant axillary adenopathy is present.  There are small supraclavicular nodes on the left. The largest measures 11 x 8 mm with somewhat indistinct margins, raising concern for an metastasis.  The lung apices are clear.  Bone windows demonstrate chronic end plate degenerative changes at C5-6 and C6-7.  Osseous foraminal narrowing is present on  the right at C6-7.   Review of the MIP images confirms the above findings.  IMPRESSION:  1.  Right paratracheal adenopathy with a nodal mass measuring 3.0 x 2.1 by at least 3.1 cm. 2.  Additional suspicious left supraclavicular lymph node. 3.  Normal variant duplicated origin of the nondominant right vertebral artery. 4.  No significant vascular pathology in the neck. 5.  Mild cervical spondylosis.  CTA HEAD  Findings:   The postcontrast images of the brain demonstrate no pathologic enhancement.  The ventricles are of normal size.  No significant extra-axial fluid collection is present.  The left internal carotid artery is within normal limits at the skull base. There is slight fusiform dilation of the right internal carotid artery within the proximal cavernous segment.  The A1 and M1 segments are normal.  There is an early bifurcation of the right middle cerebral artery, a normal variant.  The MCA bifurcations are within normal limits.  The ACA and MCA branch vessels are unremarkable.  The left vertebral artery is the dominant vessel.  Both PICA origins are visualized and within normal limits.  The nondominant right vertebral artery is hypoplastic beyond the PICA.  The posterior cerebral arteries both originate from basilar tip.  A small left posterior communicating artery is present.  The PCA branch vessels are within normal limits.  The dural sinuses fill normally.  The source images demonstrate no focal enhancing lesions or areas of acute ischemia.   Review of the MIP images confirms the above findings.  IMPRESSION:  1.  Slight fusiform dilation of the cavernous right internal carotid artery without a distinct aneurysm. 2.  Otherwise normal CT head.  These results were called by telephone on 05/27/2012  at  08:10 a.m. to  Dr. Dr. Waymon Amato, who verbally acknowledged these results.  Original Report Authenticated By: Jamesetta Orleans. MATTERN, M.D.   Dg Chest 2 View  05/27/2012  *RADIOLOGY REPORT*  Clinical Data:  Rule out mass.  CHEST - 2 VIEW  Comparison: 05/28/2006  Findings: Airspace opacity noted in the right midlung.  This could reflect atelectasis or pneumonia.  On the lateral view, there is suggestion of a rounded area of more centrally.  Recommend chest CT to exclude central obstructing lesion with postobstructive process. Left lung is clear.  Heart is normal size.  No effusions or acute bony abnormality.  IMPRESSION: Airspace opacity in the right midlung.  Cannot exclude central obstructing lesion given the central rounded appearance on the lateral view.  Recommend chest CT with IV contrast for further evaluation.  Original Report Authenticated By: Cyndie Chime, M.D.   Ct Head Wo Contrast  05/26/2012  *RADIOLOGY REPORT*  Clinical Data: Diplopia and pain  CT HEAD WITHOUT CONTRAST  Technique:  Contiguous axial images were obtained from the base of the skull through the vertex without contrast.  Comparison: None  Findings: The brain has a normal appearance without evidence for hemorrhage, infarction, hydrocephalus, or  mass lesion.  There is no extra axial fluid collection.  The skull and paranasal sinuses are normal.  IMPRESSION:  1.  Negative exam.  Original Report Authenticated By: Rosealee Albee, M.D.   Ct Angio Neck W/cm &/or Wo/cm  05/27/2012  *RADIOLOGY REPORT*  Clinical Data:  DIPLOPIA.  PAIN.  UNFOCUSED VISION.  PTOSIS.  CT ANGIOGRAPHY HEAD AND NECK  Technique:  Multidetector CT imaging of the head and neck was performed using the standard protocol during bolus administration of intravenous contrast.  Multiplanar CT image reconstructions including MIPs were obtained to evaluate the vascular anatomy. Carotid stenosis measurements (when applicable) are obtained utilizing NASCET criteria, using the distal internal carotid diameter as the denominator.  Contrast: 50mL OMNIPAQUE IOHEXOL 350 MG/ML SOLN,  Comparison:  CT head without contrast 05/26/2012.  CTA NECK  Findings:  A standard three-vessel arch  configuration is present. The origin of the right vertebral artery is duplicated.  There is one segment which enters at to C6-7 and a second segment, slightly more anteriorly, which enters at C5-6.  There are no significant stenoses.  The right vertebral artery is the nondominant vessel. The left vertebral artery originates from the left subclavian artery without significant stenosis.  It is the dominant vessel.  The right common carotid artery is within normal limits.  The bifurcation is unremarkable.  The cervical right ICA is normal.  The left common carotid artery is within normal limits.  The bifurcation is unremarkable.  The left cervical ICA is normal.  Right paratracheal adenopathy is evident.  A nodal mass measures 2.1 x 3.0.  It is at least 3.1 cm in cephalocaudad dimension.  The disease is incompletely imaged.  No significant axillary adenopathy is present.  There are small supraclavicular nodes on the left. The largest measures 11 x 8 mm with somewhat indistinct margins, raising concern for an metastasis.  The lung apices are clear.  Bone windows demonstrate chronic end plate degenerative changes at C5-6 and C6-7.  Osseous foraminal narrowing is present on the right at C6-7.   Review of the MIP images confirms the above findings.  IMPRESSION:  1.  Right paratracheal adenopathy with a nodal mass measuring 3.0 x 2.1 by at least 3.1 cm. 2.  Additional suspicious left supraclavicular lymph node. 3.  Normal variant duplicated origin of the nondominant right vertebral artery. 4.  No significant vascular pathology in the neck. 5.  Mild cervical spondylosis.  CTA HEAD  Findings:   The postcontrast images of the brain demonstrate no pathologic enhancement.  The ventricles are of normal size.  No significant extra-axial fluid collection is present.  The left internal carotid artery is within normal limits at the skull base. There is slight fusiform dilation of the right internal carotid artery within the proximal  cavernous segment.  The A1 and M1 segments are normal.  There is an early bifurcation of the right middle cerebral artery, a normal variant.  The MCA bifurcations are within normal limits.  The ACA and MCA branch vessels are unremarkable.  The left vertebral artery is the dominant vessel.  Both PICA origins are visualized and within normal limits.  The nondominant right vertebral artery is hypoplastic beyond the PICA.  The posterior cerebral arteries both originate from basilar tip.  A small left posterior communicating artery is present.  The PCA branch vessels are within normal limits.  The dural sinuses fill normally.  The source images demonstrate no focal enhancing lesions or areas of acute ischemia.  Review of the MIP images confirms the above findings.  IMPRESSION:  1.  Slight fusiform dilation of the cavernous right internal carotid artery without a distinct aneurysm. 2.  Otherwise normal CT head.  These results were called by telephone on 05/27/2012  at  08:10 a.m. to  Dr. Dr. Waymon Amato, who verbally acknowledged these results.  Original Report Authenticated By: Jamesetta Orleans. MATTERN, M.D.    Medications:     Scheduled:   . LORazepam  1 mg Oral Once  . pneumococcal 23 valent vaccine  0.5 mL Intramuscular Tomorrow-1000  . warfarin  5 mg Oral ONCE-1800  . Warfarin - Pharmacist Dosing Inpatient   Does not apply q1800    Assessment/Plan:   66 year old female with recurrent DVT, now with intermittent episodes of subjective, binocular horizontal diplopia, intermittent right eye ptosis.  Differential diagnosis: vertebrobasilar TIA, myasthenia gravis, thyroid ophthalmopathy  1) CTA of neck showed no vascular pathology however did show a "nodal mass measures 2.1 x 3.0.  It is at least 3.1 cm in cephalocaudad dimension.   No significant axillary adenopathy was seen.  There are small supraclavicular nodes on the left. The largest measures 11 x 8 mm with somewhat indistinct margins, raising concern  for an metastasis. " [facility] are aware of this and planning to obtain a CTA of chest to further evaluate. CTA of head showed no significant vascular pathology.    2) TSH is pending,  Of note INR is 3.99 pharmacy is following.   Recommend: 1) MRI with and without contrast 2) Acetylcholine antibodies pending.    Felicie Morn PA-C [facility] [phone]  05/27/2012, 8:50 AM

## 2012-05-27 NOTE — Progress Notes (Signed)
ANTICOAGULATION CONSULT NOTE - Follow Up  Pharmacy Consult for warfarin Indication: DVT  Labs:  Basename 05/27/12 0520 05/26/12 1727 05/26/12 1418  HGB 14.1 -- 15.7*  HCT 41.0 -- 43.8  PLT 228 -- 267  LABPROT 39.5* 32.9* --  INR 3.99* 3.16* --  CREATININE 0.76 -- 0.88   Assessment: 66 yof on chronic coumadin for history of DVT. INR today is  supratherapeutic and above desired goal range of INR 2-3. Patient took her dose prior to admit yesterday which would have been 10mg . CBC is stable and no bleeding noted.  Medications reviewed and there does not appear to be any that would be contributing to the rise in her INR.  Home Regimen:   Warfarin (5mg  tabs) - She takes 10 mg on M/F and 7.5mg  on all other days.  Goal of Therapy:  INR 2-3 Monitor platelets by anticoagulation protocol: Yes   Plan:  1.  Will give 1/2 of yesterday's dose - 5mg  today to avoid dropping below desired goal range.  I suspect after her 10mg  dose yesterday, she may trend a little higher but will trend down with lower dose today. 2. Daily INR  Allergies  Allergen Reactions  . Codeine Other (See Comments)    Extreme sleepiness  . Morphine Other (See Comments)    Extreme sleepiness   Vital Signs: Temp: 97.7 F (36.5 C) (07/02 0500) Temp src: Oral (07/02 0500) BP: 98/66 mmHg (07/02 0500) Pulse Rate: 66  (07/02 0500)  Estimated Creatinine Clearance: 62.7 ml/min (by C-G formula based on Cr of 0.76).  Medical History: Past Medical History  Diagnosis Date  . DVT (deep venous thrombosis)     had 2 in right calf & 1 in the left calf  . Cataract   . Clotting disorder   . Anemia   . Hypertension   . Peripheral vascular disease   . Fibromyalgia    Nadara Mustard, PharmD., MS Clinical Pharmacist Pager:  [phone]  Thank you for allowing pharmacy to be part of this patients care team. 05/27/2012,8:27 AM

## 2012-05-27 NOTE — Evaluation (Signed)
Physical Therapy Evaluation Patient Details Name: [name] MRN: [id number] DOB: 12/23/1945 Today's Date: 05/27/2012 Time: 1191-4782 PT Time Calculation (min): 19 min  PT Assessment / Plan / Recommendation Clinical Impression  Patient presented to ED with visual changes. Stroke work-up is in progress. Patient has no skilled PT needs. She has had balance issues for almost 60 years per patient that have prevented her from riding a bike and cause her to veer to the right with gait. I have educated patient in balance exercises and on the warning signs of a stroke.     PT Assessment  Patent does not need any further PT services    Follow Up Recommendations  No PT follow up    Barriers to Discharge  None      Equipment Recommendations  None recommended by PT    Recommendations for Other Services   None  Frequency   N/A   Precautions / Restrictions Precautions Precautions: Fall   Pertinent Vitals/Pain VSS/ No pain      Mobility  Bed Mobility Bed Mobility: Supine to Sit;Sitting - Scoot to Edge of Bed;Sit to Supine Supine to Sit: 7: Independent Sitting - Scoot to Edge of Bed: 7: Independent Sit to Supine: 7: Independent Transfers Transfers: Sit to Stand;Stand to Sit Sit to Stand: 7: Independent;With upper extremity assist;Without upper extremity assist;From bed Stand to Sit: 7: Independent;Without upper extremity assist;To bed Ambulation/Gait Ambulation/Gait Assistance: 7: Independent Ambulation Distance (Feet): 300 Feet Assistive device: None Ambulation/Gait Assistance Details: Patient relates poor balance for many years. She reports constant veering to the right that her friends always comment on. Patient relates to vertigo at a young age.  Gait Pattern: Within Functional Limits Stairs: Yes Stairs Assistance: 7: Independent Stair Management Technique: No rails;Alternating pattern;Forwards Number of Stairs: 3  Modified Rankin (Stroke Patients Only) Pre-Morbid  Rankin Score: No symptoms Modified Rankin: No symptoms    Exercises Other Exercises Other Exercises: Educated in romberg and tandem stance exercises with eyes closed (30 seconds) and in corner of a room for safety to assist in improving her balance.       Visit Information  Last PT Received On: 05/27/12    Subjective Data  Subjective: Patient reports seeing double for 15 minutes on Saturday am and night and then it resolved.  Patient Stated Goal: No further visual issues   Prior Functioning  Home Living Lives With: Spouse Available Help at Discharge: Family;Available 24 hours/day Type of Home: House Home Access: Level entry Home Layout: Multi-level Alternate Level Stairs-Number of Steps: 6-7 Alternate Level Stairs-Rails: Left Bathroom Shower/Tub: Engineer, manufacturing systems: Standard Home Adaptive Equipment: None Prior Function Level of Independence: Independent Able to Take Stairs?: Yes Driving: Yes Vocation: Retired Musician: No difficulties Dominant Hand: Right    Cognition  Overall Cognitive Status: Appears within functional limits for tasks assessed/performed Arousal/Alertness: Awake/alert Orientation Level: Appears intact for tasks assessed Behavior During Session: [facility] for tasks performed    Extremity/Trunk Assessment Right Lower Extremity Assessment RLE ROM/Strength/Tone: Within functional levels RLE Sensation: WFL - Light Touch;WFL - Proprioception Left Lower Extremity Assessment LLE ROM/Strength/Tone: Within functional levels LLE Sensation: WFL - Light Touch;WFL - Proprioception LLE Coordination: WFL - gross/fine motor Trunk Assessment Trunk Assessment: Normal   Balance Standardized Balance Assessment Standardized Balance Assessment: Dynamic Gait Index Dynamic Gait Index Level Surface: Normal Change in Gait Speed: Normal Gait with Horizontal Head Turns: Mild Impairment Gait with Vertical Head Turns: Mild Impairment Gait and  Pivot Turn: Normal Step Over Obstacle:  Normal Step Around Obstacles: Normal Steps: Normal Total Score: 22   End of Session PT - End of Session Equipment Utilized During Treatment: Gait belt Activity Tolerance: Patient tolerated treatment well Patient left: in bed;with call bell/phone within reach;with family/visitor present Nurse Communication: Mobility status   Edwyna Perfect, PT  Pager [phone]  05/27/2012, 8:07 AM

## 2012-05-28 DIAGNOSIS — M79609 Pain in unspecified limb: Secondary | ICD-10-CM

## 2012-05-28 DIAGNOSIS — Z86718 Personal history of other venous thrombosis and embolism: Secondary | ICD-10-CM

## 2012-05-28 DIAGNOSIS — R279 Unspecified lack of coordination: Secondary | ICD-10-CM

## 2012-05-28 DIAGNOSIS — IMO0002 Reserved for concepts with insufficient information to code with codable children: Secondary | ICD-10-CM

## 2012-05-28 DIAGNOSIS — D649 Anemia, unspecified: Secondary | ICD-10-CM

## 2012-05-28 LAB — BASIC METABOLIC PANEL
BUN: 7 mg/dL (ref 6–23)
Calcium: 8.9 mg/dL (ref 8.4–10.5)
GFR calc Af Amer: >90 mL/min (ref >90)
GFR calc non Af Amer: 86 mL/min — ABNORMAL LOW (ref >90)
Glucose, Bld: 89 mg/dL (ref 70–99)
Potassium: 3.8 mEq/L (ref 3.5–5.1)
Sodium: 140 mEq/L (ref 135–145)

## 2012-05-28 LAB — PROTIME-INR: INR: 3.58 — ABNORMAL HIGH (ref 0.00–1.49)

## 2012-05-28 MED ORDER — SODIUM CHLORIDE 0.9 % IV SOLN
INTRAVENOUS | Status: AC
  Administered 2012-05-29: 09:00:00 via INTRAVENOUS

## 2012-05-28 MED ORDER — VITAMIN K1 10 MG/ML IJ SOLN
2.0000 mg | Freq: Once | INTRAVENOUS | Status: AC
  Administered 2012-05-28: 2 mg via INTRAVENOUS
  Filled 2012-05-28: qty 0.2

## 2012-05-28 MED ORDER — GADOBENATE DIMEGLUMINE 529 MG/ML IV SOLN
13.0000 mL | Freq: Once | INTRAVENOUS | Status: AC | PRN
  Administered 2012-05-28: 13 mL via INTRAVENOUS

## 2012-05-28 NOTE — Care Management Note (Signed)
    Page 1 of 1   05/28/2012     12:39:11 PM   CARE MANAGEMENT NOTE 05/28/2012  Patient:  [name], [name]   Account Number:  [id number]  Date Initiated:  05/28/2012  Documentation initiated by:  GRAVES-BIGELOW,Lesieli Bresee  Subjective/Objective Assessment:   Pt admitted with diplopia. Plan for d/c when medically stable.     Action/Plan:   No HH needs at this time.   Anticipated DC Date:  05/29/2012   Anticipated DC Plan:  HOME/SELF CARE      DC Planning Services  CM consult      Choice offered to / List presented to:             Status of service:  Completed, signed off Medicare Important Message given?   (If response is "NO", the following Medicare IM given date fields will be blank) Date Medicare IM given:   Date Additional Medicare IM given:    Discharge Disposition:  HOME/SELF CARE  Per UR Regulation:  Reviewed for med. necessity/level of care/duration of stay  If discussed at Long Length of Stay Meetings, dates discussed:    Comments:

## 2012-05-28 NOTE — Progress Notes (Signed)
[facility]                                                                                Patient Demographics  [name], is a 66 y.o. female  VHQ:[id number]  [id number]  DOB - 31-Jan-1946  Admit date - 05/26/2012  Admitting Physician Eduard Clos, MD  Outpatient Primary MD for the patient is [name], MD  LOS - 2   Chief Complaint  Patient presents with  . Diplopia  . Eye Pain        Subjective:   [name] today has, No headache, No chest pain, No abdominal pain - No Nausea, No new weakness tingling or numbness, No Cough - SOB.    Objective:   Filed Vitals:   05/27/12 1245 05/27/12 1300 05/27/12 2100 05/28/12 0500  BP: 118/81  124/76 103/71  Pulse: 66 76 64 77  Temp: 98.2 F (36.8 C)  97.8 F (36.6 C) 98.7 F (37.1 C)  TempSrc: Oral     Resp: 13  16 18   Height:      Weight:      SpO2: 99% 100% 96% 96%    Wt Readings from Last 3 Encounters:  05/26/12 65.1 kg (143 lb 8.3 oz)  05/15/12 67.132 kg (148 lb)  01/02/12 68.04 kg (150 lb)     Intake/Output Summary (Last 24 hours) at 05/28/12 1213 Last data filed at 05/28/12 0815  Gross per 24 hour  Intake    300 ml  Output      0 ml  Net    300 ml    Exam Awake Alert, Oriented *3, No new F.N deficits, Normal affect [place].AT,PERRAL Supple Neck,No JVD, No cervical lymphadenopathy appriciated.  Symmetrical Chest wall movement, Good air movement bilaterally, CTAB RRR,No Gallops,Rubs or new Murmurs, No Parasternal Heave +ve B.Sounds, Abd Soft, Non tender, No organomegaly appriciated, No rebound -guarding or rigidity. No Cyanosis, Clubbing or edema, No new Rash or bruise     Data Review  CBC  Lab 05/27/12 0520 05/26/12 1418  WBC 5.5 6.5  HGB 14.1 15.7*  HCT 41.0 43.8  PLT 228 267  MCV 87.0 87.3  MCH 29.9 31.3  MCHC 34.4 35.8  RDW 13.2 13.4  LYMPHSABS -- 1.4  MONOABS -- 0.4  EOSABS -- 0.1  BASOSABS -- 0.0  BANDABS -- --    Chemistries   Lab 05/28/12  0500 05/27/12 0520 05/26/12 1418  NA 140 138 140  K 3.8 3.2* 3.6  CL 107 103 100  CO2 24 26 28   GLUCOSE 89 100* 100*  BUN 7 9 14   CREATININE 0.74 0.76 0.88  CALCIUM 8.9 9.3 10.3  MG -- -- --  AST -- 14 17  ALT -- 11 12  ALKPHOS -- 41 52  BILITOT -- 0.3 0.2*   ------------------------------------------------------------------------------------------------------------------ estimated creatinine clearance is 62.7 ml/min (by C-G formula based on Cr of 0.74). ------------------------------------------------------------------------------------------------------------------  [facility] 05/26/12 2353  HGBA1C 5.9*   ------------------------------------------------------------------------------------------------------------------  Basename 05/27/12 0520  CHOL 166  HDL 61  LDLCALC 88  TRIG 87  CHOLHDL 2.7  LDLDIRECT --   ------------------------------------------------------------------------------------------------------------------  Basename 05/26/12 2353  TSH 2.980  T4TOTAL --  T3FREE --  THYROIDAB --   ------------------------------------------------------------------------------------------------------------------ No results found for this basename: VITAMINB12:2,FOLATE:2,FERRITIN:2,TIBC:2,IRON:2,RETICCTPCT:2 in the last 72 hours  Coagulation profile  Lab 05/28/12 0500 05/27/12 0520 05/26/12 1727  INR 3.58* 3.99* 3.16*  PROTIME -- -- --    No results found for this basename: DDIMER:2 in the last 72 hours  Cardiac Enzymes No results found for this basename: CK:3,CKMB:3,TROPONINI:3,MYOGLOBIN:3 in the last 168 hours ------------------------------------------------------------------------------------------------------------------ No components found with this basename: POCBNP:3  Micro Results No results found for this or any previous visit (from the past 240 hour(s)).  Radiology Reports Ct Angio Head W/cm &/or Wo Cm  05/27/2012  *RADIOLOGY REPORT*  Clinical Data:   DIPLOPIA.  PAIN.  UNFOCUSED VISION.  PTOSIS.  CT ANGIOGRAPHY HEAD AND NECK  Technique:  Multidetector CT imaging of the head and neck was performed using the standard protocol during bolus administration of intravenous contrast.  Multiplanar CT image reconstructions including MIPs were obtained to evaluate the vascular anatomy. Carotid stenosis measurements (when applicable) are obtained utilizing NASCET criteria, using the distal internal carotid diameter as the denominator.  Contrast: 50mL OMNIPAQUE IOHEXOL 350 MG/ML SOLN,  Comparison:  CT head without contrast 05/26/2012.  CTA NECK  Findings:  A standard three-vessel arch configuration is present. The origin of the right vertebral artery is duplicated.  There is one segment which enters at to C6-7 and a second segment, slightly more anteriorly, which enters at C5-6.  There are no significant stenoses.  The right vertebral artery is the nondominant vessel. The left vertebral artery originates from the left subclavian artery without significant stenosis.  It is the dominant vessel.  The right common carotid artery is within normal limits.  The bifurcation is unremarkable.  The cervical right ICA is normal.  The left common carotid artery is within normal limits.  The bifurcation is unremarkable.  The left cervical ICA is normal.  Right paratracheal adenopathy is evident.  A nodal mass measures 2.1 x 3.0.  It is at least 3.1 cm in cephalocaudad dimension.  The disease is incompletely imaged.  No significant axillary adenopathy is present.  There are small supraclavicular nodes on the left. The largest measures 11 x 8 mm with somewhat indistinct margins, raising concern for an metastasis.  The lung apices are clear.  Bone windows demonstrate chronic end plate degenerative changes at C5-6 and C6-7.  Osseous foraminal narrowing is present on the right at C6-7.   Review of the MIP images confirms the above findings.  IMPRESSION:  1.  Right paratracheal adenopathy with a  nodal mass measuring 3.0 x 2.1 by at least 3.1 cm. 2.  Additional suspicious left supraclavicular lymph node. 3.  Normal variant duplicated origin of the nondominant right vertebral artery. 4.  No significant vascular pathology in the neck. 5.  Mild cervical spondylosis.  CTA HEAD  Findings:   The postcontrast images of the brain demonstrate no pathologic enhancement.  The ventricles are of normal size.  No significant extra-axial fluid collection is present.  The left internal carotid artery is within normal limits at the skull base. There is slight fusiform dilation of the right internal carotid artery within the proximal cavernous segment.  The A1 and M1 segments are normal.  There is an early bifurcation of the right middle cerebral artery, a normal variant.  The MCA bifurcations are within normal limits.  The ACA and MCA branch vessels are unremarkable.  The left vertebral artery is the dominant vessel.  Both PICA origins are visualized and  within normal limits.  The nondominant right vertebral artery is hypoplastic beyond the PICA.  The posterior cerebral arteries both originate from basilar tip.  A small left posterior communicating artery is present.  The PCA branch vessels are within normal limits.  The dural sinuses fill normally.  The source images demonstrate no focal enhancing lesions or areas of acute ischemia.   Review of the MIP images confirms the above findings.  IMPRESSION:  1.  Slight fusiform dilation of the cavernous right internal carotid artery without a distinct aneurysm. 2.  Otherwise normal CT head.  These results were called by telephone on 05/27/2012  at  08:10 a.m. to  Dr. Dr. Waymon Amato, who verbally acknowledged these results.  Original Report Authenticated By: Jamesetta Orleans. MATTERN, M.D.   Dg Chest 2 View  05/27/2012  *RADIOLOGY REPORT*  Clinical Data: Rule out mass.  CHEST - 2 VIEW  Comparison: 05/28/2006  Findings: Airspace opacity noted in the right midlung.  This could reflect  atelectasis or pneumonia.  On the lateral view, there is suggestion of a rounded area of more centrally.  Recommend chest CT to exclude central obstructing lesion with postobstructive process. Left lung is clear.  Heart is normal size.  No effusions or acute bony abnormality.  IMPRESSION: Airspace opacity in the right midlung.  Cannot exclude central obstructing lesion given the central rounded appearance on the lateral view.  Recommend chest CT with IV contrast for further evaluation.  Original Report Authenticated By: Cyndie Chime, M.D.   Ct Head Wo Contrast  05/26/2012  *RADIOLOGY REPORT*  Clinical Data: Diplopia and pain  CT HEAD WITHOUT CONTRAST  Technique:  Contiguous axial images were obtained from the base of the skull through the vertex without contrast.  Comparison: None  Findings: The brain has a normal appearance without evidence for hemorrhage, infarction, hydrocephalus, or mass lesion.  There is no extra axial fluid collection.  The skull and paranasal sinuses are normal.  IMPRESSION:  1.  Negative exam.  Original Report Authenticated By: Rosealee Albee, M.D.   Ct Angio Neck W/cm &/or Wo/cm  05/27/2012  *RADIOLOGY REPORT*  Clinical Data:  DIPLOPIA.  PAIN.  UNFOCUSED VISION.  PTOSIS.  CT ANGIOGRAPHY HEAD AND NECK  Technique:  Multidetector CT imaging of the head and neck was performed using the standard protocol during bolus administration of intravenous contrast.  Multiplanar CT image reconstructions including MIPs were obtained to evaluate the vascular anatomy. Carotid stenosis measurements (when applicable) are obtained utilizing NASCET criteria, using the distal internal carotid diameter as the denominator.  Contrast: 50mL OMNIPAQUE IOHEXOL 350 MG/ML SOLN,  Comparison:  CT head without contrast 05/26/2012.  CTA NECK  Findings:  A standard three-vessel arch configuration is present. The origin of the right vertebral artery is duplicated.  There is one segment which enters at to C6-7 and a  second segment, slightly more anteriorly, which enters at C5-6.  There are no significant stenoses.  The right vertebral artery is the nondominant vessel. The left vertebral artery originates from the left subclavian artery without significant stenosis.  It is the dominant vessel.  The right common carotid artery is within normal limits.  The bifurcation is unremarkable.  The cervical right ICA is normal.  The left common carotid artery is within normal limits.  The bifurcation is unremarkable.  The left cervical ICA is normal.  Right paratracheal adenopathy is evident.  A nodal mass measures 2.1 x 3.0.  It is at least 3.1 cm in cephalocaudad dimension.  The disease is incompletely imaged.  No significant axillary adenopathy is present.  There are small supraclavicular nodes on the left. The largest measures 11 x 8 mm with somewhat indistinct margins, raising concern for an metastasis.  The lung apices are clear.  Bone windows demonstrate chronic end plate degenerative changes at C5-6 and C6-7.  Osseous foraminal narrowing is present on the right at C6-7.   Review of the MIP images confirms the above findings.  IMPRESSION:  1.  Right paratracheal adenopathy with a nodal mass measuring 3.0 x 2.1 by at least 3.1 cm. 2.  Additional suspicious left supraclavicular lymph node. 3.  Normal variant duplicated origin of the nondominant right vertebral artery. 4.  No significant vascular pathology in the neck. 5.  Mild cervical spondylosis.  CTA HEAD  Findings:   The postcontrast images of the brain demonstrate no pathologic enhancement.  The ventricles are of normal size.  No significant extra-axial fluid collection is present.  The left internal carotid artery is within normal limits at the skull base. There is slight fusiform dilation of the right internal carotid artery within the proximal cavernous segment.  The A1 and M1 segments are normal.  There is an early bifurcation of the right middle cerebral artery, a normal  variant.  The MCA bifurcations are within normal limits.  The ACA and MCA branch vessels are unremarkable.  The left vertebral artery is the dominant vessel.  Both PICA origins are visualized and within normal limits.  The nondominant right vertebral artery is hypoplastic beyond the PICA.  The posterior cerebral arteries both originate from basilar tip.  A small left posterior communicating artery is present.  The PCA branch vessels are within normal limits.  The dural sinuses fill normally.  The source images demonstrate no focal enhancing lesions or areas of acute ischemia.   Review of the MIP images confirms the above findings.  IMPRESSION:  1.  Slight fusiform dilation of the cavernous right internal carotid artery without a distinct aneurysm. 2.  Otherwise normal CT head.  These results were called by telephone on 05/27/2012  at  08:10 a.m. to  Dr. Dr. Waymon Amato, who verbally acknowledged these results.  Original Report Authenticated By: Jamesetta Orleans. MATTERN, M.D.   Ct Chest W Contrast  05/27/2012  *RADIOLOGY REPORT*  Clinical Data: Right lung mass.  Mediastinal adenopathy.  CT CHEST WITH CONTRAST  Technique:  Multidetector CT imaging of the chest was performed following the standard protocol during bolus administration of intravenous contrast.  Contrast: 80mL OMNIPAQUE IOHEXOL 300 MG/ML  SOLN  Comparison: Chest x-ray dated 05/26/2012  Findings: The patient has a tumor at the confluence of the right upper, middle, and lower lobes.  The tumor measures 3.6 x 2.7 x 1.8 cm.  The tumors primarily in the middle lobe but there is a metastatic lymph node in the hilum of the right lower lobe measuring 1.4 x 1.0 x 0.8 cm.  In addition, there is a 4 mm metastatic nodule in the right upper lobe with the metastatic lymph nodes in the hilum of the right upper lobe measuring 1.2 x 1.4 x 1.0 cm.  There is mediastinal adenopathy with a 4.4 x 2.8 x 1.8 cm azygos lymph nodes and precarinal adenopathy.  There is also a 1.0 x  1.2cm.  lymph node anterior to the superior vena cava adjacent to the ascending thoracic aorta.  The left lung is clear.  No effusions.  Heart size is normal.  No significant osseous abnormality.  The visualized portion of the upper abdomen is normal except for findings of previous cholecystectomy.  IMPRESSION:  1. Tumor in the right middle lobe with spreading of the tumor into the right upper lobe and into the right lower lobe as described. 2.  Mediastinal and hilar adenopathy consistent with metastatic disease.  Original Report Authenticated By: Gwynn Burly, M.D.   Mr Laqueta Jean Wo Contrast  05/27/2012  **ADDENDUM** CREATED: 05/27/2012 15:32:10  Impression:  No intracranial enhancing lesion or bony destructive lesion to suggest presence of intracranial metastatic disease.  No acute infarct noted.  8 mm polypoid lesion in the left posterior-superior nasopharynx. Although this may represent a Thornwaldt cyst, mucosal abnormality not excluded.  Please see above.  **END ADDENDUM** SIGNED BY: Almedia Balls. Constance Goltz, M.D.   05/27/2012  *RADIOLOGY REPORT*  Clinical Data: Diplopia which has resolved. Chest mass/adenopathy.  MRI HEAD WITHOUT AND WITH CONTRAST  Technique:  Multiplanar, multiecho pulse sequences of the brain and surrounding structures were obtained according to standard protocol without and with intravenous contrast  Contrast:  13 ml MultiHance  Comparison: 05/27/2012 CT angiogram.  No comparison brain MR.  Findings: Slight asymmetry of the superior ophthalmic vein with the right side larger than the left however, no findings of cavernous sinus abnormality detected as a cause for this finding.  Remainder of the orbital structures appear unremarkable.  No intracranial enhancing lesion or bony destructive lesion to suggest presence of intracranial metastatic disease.  Artifact mid brain on diffusion sequence without discrete acute infarct noted.  No intracranial hemorrhage.  Minimal nonspecific white matter type  changes.  Mild global atrophy without hydrocephalus.  Major intracranial vascular structures are patent.  Polypoid opacification right frontal sinus with mild mucosal thickening ethmoid sinus air cells.  8 mm polypoid lesion in the left posterior-superior nasopharynx. Although this may represent a Thornwaldt cyst, mucosal abnormality not excluded.  No intracranial enhancing lesion or bony destructive lesion to suggest presence of intracranial metastatic disease.  No acute infarct noted.  8 mm polypoid lesion in the left posterior-superior nasopharynx. Although this may represent a Thornwaldt cyst, mucosal abnormality not excluded.  Please see above. Original Report Authenticated By: Fuller Canada, M.D.    Scheduled Meds:   . fentaNYL      . hydrochlorothiazide  25 mg Oral Daily  . LORazepam  1 mg Intravenous Once  . midazolam      . phytonadione (VITAMIN K) IV  2 mg Intravenous Once  . pneumococcal 23 valent vaccine  0.5 mL Intramuscular Tomorrow-1000  . potassium chloride  40 mEq Oral Once  . simvastatin  20 mg Oral q1800  . Warfarin - Pharmacist Dosing Inpatient   Does not apply q1800  . DISCONTD: warfarin  5 mg Oral ONCE-1800   Continuous Infusions:   . sodium chloride 100 mL/hr at 05/28/12 0945  . DISCONTD: sodium chloride 100 mL/hr at 05/26/12 2325   PRN Meds:.acetaminophen, fentaNYL, gadobenate dimeglumine, hydrALAZINE, iohexol, midazolam, sodium chloride, DISCONTD: iohexol  Assessment & Plan    1. Right lung mass, suspicious for metastatic bronchogenic carcinoma: Biopsy by interventional radiology, once INR has improved to less than 1.5. Patient would prefer Dr. Truett Perna as her oncologist- call when appropriate.   2. Hypokalemia: Repleted and stable.   3. Recurrent DVT: Supratherapeutic INR. As per discussion above, will hold Coumadin and let INR trended down by itself in preparation for biopsy of lung mass. We'll need to bridge with heparin when INR is less than 2. Discussed  with  pharmacy.Low dose Vit K once on 05/29/11.   4. Hypertension: Controlled.   5. Diplopia: Unclear etiology. No significant findings on MRI brain. Neurology consultation appreciated.Acytcholinestratse Antibodies awaited, no further neurologic workup, outpatient neurology followup.   6. Nonspecific CT and GU and MRI findings- followup outpatient primary care physician will suggest one-time followup with ENT and vascular surgery.    DVT Prophylaxis  Coumadin  Procedures CT chest, CT Head, Ct Angi Neck-Head, MR Brain  Consults Dairl Ponder K M.D on 05/28/2012 at 12:13 PM  Between 7am to 7pm - Pager - [phone]  After 7pm go to [email or web address] - password [email or web address]  And look for the night coverage person covering for me after hours  [facility] Office  [phone]

## 2012-05-28 NOTE — Progress Notes (Signed)
UR Completed Michio Thier Graves-Bigelow, RN,BSN [phone]  

## 2012-05-28 NOTE — Progress Notes (Signed)
VASCULAR LAB PRELIMINARY  PRELIMINARY  PRELIMINARY  PRELIMINARY  Right lower extremity venous duplex completed.    Preliminary report:  Right:  No evidence of DVT, superficial thrombosis, or Baker's cyst.  [name], RVS 05/28/2012, 10:28 AM

## 2012-05-28 NOTE — Progress Notes (Signed)
OT Discharge Note: Pt at baseline functioning with no acute OT indicated at this time. Will sign off. Thank you Glendale Chard, OTR/L Pager: [phone] 05/28/2012

## 2012-05-28 NOTE — ED Provider Notes (Signed)
Medical screening examination/treatment/procedure(s) were conducted as a shared visit with non-physician practitioner(s) and myself.  I personally evaluated the patient during the encounter. Patient with clotting disorder possible TIA symptoms. CT is reassuring, but patient was unable to tolerate MRI. She stated that she has been intubated to get it done. She'll be admitted for further workup.  Juliet Rude. Rubin Payor, MD 05/28/12 1610

## 2012-05-28 NOTE — H&P (Signed)
[name] is an 66 y.o. female.   Chief Complaint: blurred vision/ ha Work up shows Right lung mass on CT Bilat DVT Scheduled for Right lung mass biopsy asap; INR 3.58 today HPI: HTN; PVD  Past Medical History  Diagnosis Date  . DVT (deep venous thrombosis)     had 2 in right calf & 1 in the left calf  . Cataract   . Clotting disorder   . Anemia   . Hypertension   . Peripheral vascular disease   . Fibromyalgia     Past Surgical History  Procedure Date  . Appendectomy   . Cholecystectomy   . Tonsillectomy   . Knee arthroscopy     right  . Laminectomy     lumbar  . Colonoscopy     11/28/09 repeat in 10 years  . Back surgery     Family History  Problem Relation Age of Onset  . Anemia      FE deficiency   . Arthritis      family history  . Cancer      colon 1st degree relative <60  . Hypertension      family history  . Multiple sclerosis Sister    Social History:  reports that she has been smoking Cigarettes.  She has never used smokeless tobacco. She reports that she does not drink alcohol or use illicit drugs.  Allergies:  Allergies  Allergen Reactions  . Codeine Other (See Comments)    Extreme sleepiness  . Morphine Other (See Comments)    Extreme sleepiness    Medications Prior to Admission  Medication Sig Dispense Refill  . acetaminophen (TYLENOL) 500 MG tablet Take 500 mg by mouth every 6 (six) hours as needed. For pain      . calcium carbonate (OS-CAL) 600 MG TABS Take 600 mg by mouth daily. + 600 mg Vit D + 400 mg of Magnesium       . CYANOCOBALAMIN IJ Inject 1 application as directed every 30 (thirty) days. Around the 22nd or 23rd of each month      . hydrochlorothiazide (HYDRODIURIL) 25 MG tablet Take 25 mg by mouth daily.      . Potassium 99 MG TABS Take 1 tablet by mouth daily.        . simvastatin (ZOCOR) 20 MG tablet TAKE 1 TABLET BY MOUTH EVERY DAY  30 tablet  11  . warfarin (COUMADIN) 5 MG tablet Take 7.5-10 mg by mouth daily. 10 mg  on Monday and Friday, 7.5 mg all other days        Results for orders placed during the hospital encounter of 05/26/12 (from the past 48 hour(s))  CBC WITH DIFFERENTIAL     Status: Abnormal   Collection Time   05/26/12  2:18 PM      Component Value Range Comment   WBC 6.5  4.0 - 10.5 K/uL    RBC 5.02  3.87 - 5.11 MIL/uL    Hemoglobin 15.7 (*) 12.0 - 15.0 g/dL    HCT 29.5  62.1 - 30.8 %    MCV 87.3  78.0 - 100.0 fL    MCH 31.3  26.0 - 34.0 pg    MCHC 35.8  30.0 - 36.0 g/dL    RDW 65.7  84.6 - 96.2 %    Platelets 267  150 - 400 K/uL    Neutrophils Relative 70  43 - 77 %    Neutro Abs 4.6  1.7 - 7.7 K/uL  Lymphocytes Relative 22  12 - 46 %    Lymphs Abs 1.4  0.7 - 4.0 K/uL    Monocytes Relative 7  3 - 12 %    Monocytes Absolute 0.4  0.1 - 1.0 K/uL    Eosinophils Relative 1  0 - 5 %    Eosinophils Absolute 0.1  0.0 - 0.7 K/uL    Basophils Relative 0  0 - 1 %    Basophils Absolute 0.0  0.0 - 0.1 K/uL   COMPREHENSIVE METABOLIC PANEL     Status: Abnormal   Collection Time   05/26/12  2:18 PM      Component Value Range Comment   Sodium 140  135 - 145 mEq/L    Potassium 3.6  3.5 - 5.1 mEq/L    Chloride 100  96 - 112 mEq/L    CO2 28  19 - 32 mEq/L    Glucose, Bld 100 (*) 70 - 99 mg/dL    BUN 14  6 - 23 mg/dL    Creatinine, Ser 2.13  0.50 - 1.10 mg/dL    Calcium 08.6  8.4 - 10.5 mg/dL    Total Protein 7.5  6.0 - 8.3 g/dL    Albumin 4.3  3.5 - 5.2 g/dL    AST 17  0 - 37 U/L    ALT 12  0 - 35 U/L    Alkaline Phosphatase 52  39 - 117 U/L    Total Bilirubin 0.2 (*) 0.3 - 1.2 mg/dL    GFR calc non Af Amer 66 (*) >90 mL/min    GFR calc Af Amer 77 (*) >90 mL/min   PROTIME-INR     Status: Abnormal   Collection Time   05/26/12  5:27 PM      Component Value Range Comment   Prothrombin Time 32.9 (*) 11.6 - 15.2 seconds    INR 3.16 (*) 0.00 - 1.49   URINALYSIS, ROUTINE W REFLEX MICROSCOPIC     Status: Abnormal   Collection Time   05/26/12  5:53 PM      Component Value Range Comment    Color, Urine YELLOW  YELLOW    APPearance CLEAR  CLEAR    Specific Gravity, Urine 1.015  1.005 - 1.030    pH 7.5  5.0 - 8.0    Glucose, UA NEGATIVE  NEGATIVE mg/dL    Hgb urine dipstick SMALL (*) NEGATIVE    Bilirubin Urine NEGATIVE  NEGATIVE    Ketones, ur 15 (*) NEGATIVE mg/dL    Protein, ur NEGATIVE  NEGATIVE mg/dL    Urobilinogen, UA 0.2  0.0 - 1.0 mg/dL    Nitrite NEGATIVE  NEGATIVE    Leukocytes, UA TRACE (*) NEGATIVE   URINE RAPID DRUG SCREEN (HOSP PERFORMED)     Status: Normal   Collection Time   05/26/12  5:53 PM      Component Value Range Comment   Opiates NONE DETECTED  NONE DETECTED    Cocaine NONE DETECTED  NONE DETECTED    Benzodiazepines NONE DETECTED  NONE DETECTED    Amphetamines NONE DETECTED  NONE DETECTED    Tetrahydrocannabinol NONE DETECTED  NONE DETECTED    Barbiturates NONE DETECTED  NONE DETECTED   URINE MICROSCOPIC-ADD ON     Status: Abnormal   Collection Time   05/26/12  5:53 PM      Component Value Range Comment   Squamous Epithelial / LPF FEW (*) RARE    WBC, UA 0-2  <3 WBC/hpf  RBC / HPF 3-6  <3 RBC/hpf    Bacteria, UA RARE  RARE    Urine-Other MUCOUS PRESENT     POCT I-STAT TROPONIN I     Status: Normal   Collection Time   05/26/12  6:57 PM      Component Value Range Comment   Troponin i, poc 0.00  0.00 - 0.08 ng/mL    Comment 3            HEMOGLOBIN A1C     Status: Abnormal   Collection Time   05/26/12 11:53 PM      Component Value Range Comment   Hemoglobin A1C 5.9 (*) <5.7 %    Mean Plasma Glucose 123 (*) <117 mg/dL   TSH     Status: Normal   Collection Time   05/26/12 11:53 PM      Component Value Range Comment   TSH 2.980  0.350 - 4.500 uIU/mL   GLUCOSE, CAPILLARY     Status: Abnormal   Collection Time   05/27/12  1:19 AM      Component Value Range Comment   Glucose-Capillary 108 (*) 70 - 99 mg/dL   COMPREHENSIVE METABOLIC PANEL     Status: Abnormal   Collection Time   05/27/12  5:20 AM      Component Value Range Comment   Sodium 138   135 - 145 mEq/L    Potassium 3.2 (*) 3.5 - 5.1 mEq/L    Chloride 103  96 - 112 mEq/L    CO2 26  19 - 32 mEq/L    Glucose, Bld 100 (*) 70 - 99 mg/dL    BUN 9  6 - 23 mg/dL    Creatinine, Ser 1.61  0.50 - 1.10 mg/dL    Calcium 9.3  8.4 - 09.6 mg/dL    Total Protein 6.4  6.0 - 8.3 g/dL    Albumin 3.6  3.5 - 5.2 g/dL    AST 14  0 - 37 U/L    ALT 11  0 - 35 U/L    Alkaline Phosphatase 41  39 - 117 U/L    Total Bilirubin 0.3  0.3 - 1.2 mg/dL    GFR calc non Af Amer 85 (*) >90 mL/min    GFR calc Af Amer >90  >90 mL/min   CBC     Status: Normal   Collection Time   05/27/12  5:20 AM      Component Value Range Comment   WBC 5.5  4.0 - 10.5 K/uL    RBC 4.71  3.87 - 5.11 MIL/uL    Hemoglobin 14.1  12.0 - 15.0 g/dL    HCT 04.5  40.9 - 81.1 %    MCV 87.0  78.0 - 100.0 fL    MCH 29.9  26.0 - 34.0 pg    MCHC 34.4  30.0 - 36.0 g/dL    RDW 91.4  78.2 - 95.6 %    Platelets 228  150 - 400 K/uL   PROTIME-INR     Status: Abnormal   Collection Time   05/27/12  5:20 AM      Component Value Range Comment   Prothrombin Time 39.5 (*) 11.6 - 15.2 seconds    INR 3.99 (*) 0.00 - 1.49   LIPID PANEL     Status: Normal   Collection Time   05/27/12  5:20 AM      Component Value Range Comment   Cholesterol 166  0 - 200 mg/dL  Triglycerides 87  <150 mg/dL    HDL 61  >40 mg/dL    Total CHOL/HDL Ratio 2.7      VLDL 17  0 - 40 mg/dL    LDL Cholesterol 88  0 - 99 mg/dL   URINE RAPID DRUG SCREEN (HOSP PERFORMED)     Status: Normal   Collection Time   05/27/12  9:22 AM      Component Value Range Comment   Opiates NONE DETECTED  NONE DETECTED    Cocaine NONE DETECTED  NONE DETECTED    Benzodiazepines NONE DETECTED  NONE DETECTED    Amphetamines NONE DETECTED  NONE DETECTED    Tetrahydrocannabinol NONE DETECTED  NONE DETECTED    Barbiturates NONE DETECTED  NONE DETECTED   GLUCOSE, CAPILLARY     Status: Normal   Collection Time   05/27/12  1:19 PM      Component Value Range Comment   Glucose-Capillary 70  70 -  99 mg/dL    Comment 1 Notify RN     GLUCOSE, CAPILLARY     Status: Normal   Collection Time   05/27/12  4:37 PM      Component Value Range Comment   Glucose-Capillary 88  70 - 99 mg/dL    Comment 1 Notify RN     PROTIME-INR     Status: Abnormal   Collection Time   05/28/12  5:00 AM      Component Value Range Comment   Prothrombin Time 36.3 (*) 11.6 - 15.2 seconds    INR 3.58 (*) 0.00 - 1.49   BASIC METABOLIC PANEL     Status: Abnormal   Collection Time   05/28/12  5:00 AM      Component Value Range Comment   Sodium 140  135 - 145 mEq/L    Potassium 3.8  3.5 - 5.1 mEq/L    Chloride 107  96 - 112 mEq/L    CO2 24  19 - 32 mEq/L    Glucose, Bld 89  70 - 99 mg/dL    BUN 7  6 - 23 mg/dL    Creatinine, Ser 9.81  0.50 - 1.10 mg/dL    Calcium 8.9  8.4 - 19.1 mg/dL    GFR calc non Af Amer 86 (*) >90 mL/min    GFR calc Af Amer >90  >90 mL/min    Ct Angio Head W/cm &/or Wo Cm  05/27/2012  *RADIOLOGY REPORT*  Clinical Data:  DIPLOPIA.  PAIN.  UNFOCUSED VISION.  PTOSIS.  CT ANGIOGRAPHY HEAD AND NECK  Technique:  Multidetector CT imaging of the head and neck was performed using the standard protocol during bolus administration of intravenous contrast.  Multiplanar CT image reconstructions including MIPs were obtained to evaluate the vascular anatomy. Carotid stenosis measurements (when applicable) are obtained utilizing NASCET criteria, using the distal internal carotid diameter as the denominator.  Contrast: 50mL OMNIPAQUE IOHEXOL 350 MG/ML SOLN,  Comparison:  CT head without contrast 05/26/2012.  CTA NECK  Findings:  A standard three-vessel arch configuration is present. The origin of the right vertebral artery is duplicated.  There is one segment which enters at to C6-7 and a second segment, slightly more anteriorly, which enters at C5-6.  There are no significant stenoses.  The right vertebral artery is the nondominant vessel. The left vertebral artery originates from the left subclavian artery without  significant stenosis.  It is the dominant vessel.  The right common carotid artery is within normal limits.  The  bifurcation is unremarkable.  The cervical right ICA is normal.  The left common carotid artery is within normal limits.  The bifurcation is unremarkable.  The left cervical ICA is normal.  Right paratracheal adenopathy is evident.  A nodal mass measures 2.1 x 3.0.  It is at least 3.1 cm in cephalocaudad dimension.  The disease is incompletely imaged.  No significant axillary adenopathy is present.  There are small supraclavicular nodes on the left. The largest measures 11 x 8 mm with somewhat indistinct margins, raising concern for an metastasis.  The lung apices are clear.  Bone windows demonstrate chronic end plate degenerative changes at C5-6 and C6-7.  Osseous foraminal narrowing is present on the right at C6-7.   Review of the MIP images confirms the above findings.  IMPRESSION:  1.  Right paratracheal adenopathy with a nodal mass measuring 3.0 x 2.1 by at least 3.1 cm. 2.  Additional suspicious left supraclavicular lymph node. 3.  Normal variant duplicated origin of the nondominant right vertebral artery. 4.  No significant vascular pathology in the neck. 5.  Mild cervical spondylosis.  CTA HEAD  Findings:   The postcontrast images of the brain demonstrate no pathologic enhancement.  The ventricles are of normal size.  No significant extra-axial fluid collection is present.  The left internal carotid artery is within normal limits at the skull base. There is slight fusiform dilation of the right internal carotid artery within the proximal cavernous segment.  The A1 and M1 segments are normal.  There is an early bifurcation of the right middle cerebral artery, a normal variant.  The MCA bifurcations are within normal limits.  The ACA and MCA branch vessels are unremarkable.  The left vertebral artery is the dominant vessel.  Both PICA origins are visualized and within normal limits.  The nondominant  right vertebral artery is hypoplastic beyond the PICA.  The posterior cerebral arteries both originate from basilar tip.  A small left posterior communicating artery is present.  The PCA branch vessels are within normal limits.  The dural sinuses fill normally.  The source images demonstrate no focal enhancing lesions or areas of acute ischemia.   Review of the MIP images confirms the above findings.  IMPRESSION:  1.  Slight fusiform dilation of the cavernous right internal carotid artery without a distinct aneurysm. 2.  Otherwise normal CT head.  These results were called by telephone on 05/27/2012  at  08:10 a.m. to  Dr. Dr. Waymon Amato, who verbally acknowledged these results.  Original Report Authenticated By: Jamesetta Orleans. MATTERN, M.D.   Dg Chest 2 View  05/27/2012  *RADIOLOGY REPORT*  Clinical Data: Rule out mass.  CHEST - 2 VIEW  Comparison: 05/28/2006  Findings: Airspace opacity noted in the right midlung.  This could reflect atelectasis or pneumonia.  On the lateral view, there is suggestion of a rounded area of more centrally.  Recommend chest CT to exclude central obstructing lesion with postobstructive process. Left lung is clear.  Heart is normal size.  No effusions or acute bony abnormality.  IMPRESSION: Airspace opacity in the right midlung.  Cannot exclude central obstructing lesion given the central rounded appearance on the lateral view.  Recommend chest CT with IV contrast for further evaluation.  Original Report Authenticated By: Cyndie Chime, M.D.   Ct Head Wo Contrast  05/26/2012  *RADIOLOGY REPORT*  Clinical Data: Diplopia and pain  CT HEAD WITHOUT CONTRAST  Technique:  Contiguous axial images were obtained from the base of  the skull through the vertex without contrast.  Comparison: None  Findings: The brain has a normal appearance without evidence for hemorrhage, infarction, hydrocephalus, or mass lesion.  There is no extra axial fluid collection.  The skull and paranasal sinuses are  normal.  IMPRESSION:  1.  Negative exam.  Original Report Authenticated By: Rosealee Albee, M.D.   Ct Angio Neck W/cm &/or Wo/cm  05/27/2012  *RADIOLOGY REPORT*  Clinical Data:  DIPLOPIA.  PAIN.  UNFOCUSED VISION.  PTOSIS.  CT ANGIOGRAPHY HEAD AND NECK  Technique:  Multidetector CT imaging of the head and neck was performed using the standard protocol during bolus administration of intravenous contrast.  Multiplanar CT image reconstructions including MIPs were obtained to evaluate the vascular anatomy. Carotid stenosis measurements (when applicable) are obtained utilizing NASCET criteria, using the distal internal carotid diameter as the denominator.  Contrast: 50mL OMNIPAQUE IOHEXOL 350 MG/ML SOLN,  Comparison:  CT head without contrast 05/26/2012.  CTA NECK  Findings:  A standard three-vessel arch configuration is present. The origin of the right vertebral artery is duplicated.  There is one segment which enters at to C6-7 and a second segment, slightly more anteriorly, which enters at C5-6.  There are no significant stenoses.  The right vertebral artery is the nondominant vessel. The left vertebral artery originates from the left subclavian artery without significant stenosis.  It is the dominant vessel.  The right common carotid artery is within normal limits.  The bifurcation is unremarkable.  The cervical right ICA is normal.  The left common carotid artery is within normal limits.  The bifurcation is unremarkable.  The left cervical ICA is normal.  Right paratracheal adenopathy is evident.  A nodal mass measures 2.1 x 3.0.  It is at least 3.1 cm in cephalocaudad dimension.  The disease is incompletely imaged.  No significant axillary adenopathy is present.  There are small supraclavicular nodes on the left. The largest measures 11 x 8 mm with somewhat indistinct margins, raising concern for an metastasis.  The lung apices are clear.  Bone windows demonstrate chronic end plate degenerative changes at C5-6  and C6-7.  Osseous foraminal narrowing is present on the right at C6-7.   Review of the MIP images confirms the above findings.  IMPRESSION:  1.  Right paratracheal adenopathy with a nodal mass measuring 3.0 x 2.1 by at least 3.1 cm. 2.  Additional suspicious left supraclavicular lymph node. 3.  Normal variant duplicated origin of the nondominant right vertebral artery. 4.  No significant vascular pathology in the neck. 5.  Mild cervical spondylosis.  CTA HEAD  Findings:   The postcontrast images of the brain demonstrate no pathologic enhancement.  The ventricles are of normal size.  No significant extra-axial fluid collection is present.  The left internal carotid artery is within normal limits at the skull base. There is slight fusiform dilation of the right internal carotid artery within the proximal cavernous segment.  The A1 and M1 segments are normal.  There is an early bifurcation of the right middle cerebral artery, a normal variant.  The MCA bifurcations are within normal limits.  The ACA and MCA branch vessels are unremarkable.  The left vertebral artery is the dominant vessel.  Both PICA origins are visualized and within normal limits.  The nondominant right vertebral artery is hypoplastic beyond the PICA.  The posterior cerebral arteries both originate from basilar tip.  A small left posterior communicating artery is present.  The PCA branch vessels are  within normal limits.  The dural sinuses fill normally.  The source images demonstrate no focal enhancing lesions or areas of acute ischemia.   Review of the MIP images confirms the above findings.  IMPRESSION:  1.  Slight fusiform dilation of the cavernous right internal carotid artery without a distinct aneurysm. 2.  Otherwise normal CT head.  These results were called by telephone on 05/27/2012  at  08:10 a.m. to  Dr. Dr. Waymon Amato, who verbally acknowledged these results.  Original Report Authenticated By: Jamesetta Orleans. MATTERN, M.D.   Ct Chest W  Contrast  05/27/2012  *RADIOLOGY REPORT*  Clinical Data: Right lung mass.  Mediastinal adenopathy.  CT CHEST WITH CONTRAST  Technique:  Multidetector CT imaging of the chest was performed following the standard protocol during bolus administration of intravenous contrast.  Contrast: 80mL OMNIPAQUE IOHEXOL 300 MG/ML  SOLN  Comparison: Chest x-ray dated 05/26/2012  Findings: The patient has a tumor at the confluence of the right upper, middle, and lower lobes.  The tumor measures 3.6 x 2.7 x 1.8 cm.  The tumors primarily in the middle lobe but there is a metastatic lymph node in the hilum of the right lower lobe measuring 1.4 x 1.0 x 0.8 cm.  In addition, there is a 4 mm metastatic nodule in the right upper lobe with the metastatic lymph nodes in the hilum of the right upper lobe measuring 1.2 x 1.4 x 1.0 cm.  There is mediastinal adenopathy with a 4.4 x 2.8 x 1.8 cm azygos lymph nodes and precarinal adenopathy.  There is also a 1.0 x 1.2cm.  lymph node anterior to the superior vena cava adjacent to the ascending thoracic aorta.  The left lung is clear.  No effusions.  Heart size is normal.  No significant osseous abnormality.  The visualized portion of the upper abdomen is normal except for findings of previous cholecystectomy.  IMPRESSION:  1. Tumor in the right middle lobe with spreading of the tumor into the right upper lobe and into the right lower lobe as described. 2.  Mediastinal and hilar adenopathy consistent with metastatic disease.  Original Report Authenticated By: Gwynn Burly, M.D.   Mr Laqueta Jean Wo Contrast  05/27/2012  **ADDENDUM** CREATED: 05/27/2012 15:32:10  Impression:  No intracranial enhancing lesion or bony destructive lesion to suggest presence of intracranial metastatic disease.  No acute infarct noted.  8 mm polypoid lesion in the left posterior-superior nasopharynx. Although this may represent a Thornwaldt cyst, mucosal abnormality not excluded.  Please see above.  **END ADDENDUM** SIGNED  BY: Almedia Balls. Constance Goltz, M.D.   05/27/2012  *RADIOLOGY REPORT*  Clinical Data: Diplopia which has resolved. Chest mass/adenopathy.  MRI HEAD WITHOUT AND WITH CONTRAST  Technique:  Multiplanar, multiecho pulse sequences of the brain and surrounding structures were obtained according to standard protocol without and with intravenous contrast  Contrast:  13 ml MultiHance  Comparison: 05/27/2012 CT angiogram.  No comparison brain MR.  Findings: Slight asymmetry of the superior ophthalmic vein with the right side larger than the left however, no findings of cavernous sinus abnormality detected as a cause for this finding.  Remainder of the orbital structures appear unremarkable.  No intracranial enhancing lesion or bony destructive lesion to suggest presence of intracranial metastatic disease.  Artifact mid brain on diffusion sequence without discrete acute infarct noted.  No intracranial hemorrhage.  Minimal nonspecific white matter type changes.  Mild global atrophy without hydrocephalus.  Major intracranial vascular structures are patent.  Polypoid opacification right  frontal sinus with mild mucosal thickening ethmoid sinus air cells.  8 mm polypoid lesion in the left posterior-superior nasopharynx. Although this may represent a Thornwaldt cyst, mucosal abnormality not excluded.  No intracranial enhancing lesion or bony destructive lesion to suggest presence of intracranial metastatic disease.  No acute infarct noted.  8 mm polypoid lesion in the left posterior-superior nasopharynx. Although this may represent a Thornwaldt cyst, mucosal abnormality not excluded.  Please see above. Original Report Authenticated By: Fuller Canada, M.D.    Review of Systems  Constitutional: Positive for weight loss. Negative for fever.  Eyes: Positive for blurred vision.  Respiratory: Positive for cough.   Cardiovascular: Negative for chest pain.  Gastrointestinal: Negative for nausea and vomiting.  Neurological: Positive for  dizziness.    Blood pressure 103/71, pulse 77, temperature 98.7 F (37.1 C), temperature source Oral, resp. rate 18, height 5' 5.5" (1.664 m), weight 143 lb 8.3 oz (65.1 kg), SpO2 96.00%. Physical Exam  Constitutional: She is oriented to person, place, and time. She appears well-developed and well-nourished.  Cardiovascular: Normal rate, regular rhythm and normal heart sounds.   No murmur heard. Respiratory: Effort normal and breath sounds normal. She has no wheezes.  GI: Soft. Bowel sounds are normal. There is no tenderness.  Musculoskeletal: Normal range of motion.  Neurological: She is alert and oriented to person, place, and time.  Skin: Skin is warm and dry.  Psychiatric: She has a normal mood and affect. Her behavior is normal. Judgment and thought content normal.     Assessment/Plan Right lung mass on CT Bilat DVT Scheduled for lung mass biopsy asap Need INR to be 1.5 or lower to safely proceed (3.58 today) Pt aware and agreeable Lung mass biopsy procedure discussed with the pt and family Aware and agreeable of risks and benefits and agreeable to proceed Consent signed and in chart  Ivo Moga A 05/28/2012, 1:10 PM

## 2012-05-28 NOTE — Progress Notes (Signed)
VASCULAR LAB PRELIMINARY  ARTERIAL  ABI completed:    RIGHT    LEFT    PRESSURE WAVEFORM  PRESSURE WAVEFORM  BRACHIAL 127 Triphasic BRACHIAL 123 Triphasic  DP 128 Triphasic DP 123 Triphasic         PT 130 Biphasic PT 133 Triphasic                  RIGHT LEFT  ABI 1.02 1.05    ABIs and Doppler waveforms are within normal limits bilaterally at rest. Bilateral duplex scan revealed no evidence of stenosis bilaterally.  Aarushi Hemric, RVS 05/28/2012, 10:30 AM

## 2012-05-28 NOTE — Progress Notes (Addendum)
Patient ID: [name], female   DOB: 1946-10-01, 66 y.o.   MRN: [id number]   INR 3.58 today  RML mass biopsy has been requested   INR still too high to perform safely  Pt aware  we will check INR daily and move forward with Lung mass biopsy when INR is normal (1.5 or lower)

## 2012-05-28 NOTE — Progress Notes (Signed)
OT Cancellation Note  Treatment cancelled today due to:  Pt currently off floor at vascular lab. OT to continue to check back and follow acutely.    Harrel Carina [place]   OTR/L Pager: [phone] Office: [phone] .

## 2012-05-28 NOTE — Progress Notes (Signed)
[facility] PROGRESS NOTE    SUBJECTIVE   No further episodes of diplopia.  Walking in room without difficulty. Anxious about lung findings.   OBJECTIVE   Vital signs in last 24 hours: Temp:  [97.8 F (36.6 C)-98.7 F (37.1 C)] 98.7 F (37.1 C) (07/03 0500) Pulse Rate:  [62-77] 77  (07/03 0500) Resp:  [11-18] 18  (07/03 0500) BP: (90-125)/(60-81) 103/71 mmHg (07/03 0500) SpO2:  [93 %-100 %] 96 % (07/03 0500)  Intake/Output from previous day:   Intake/Output this shift:   Nutritional status: General  Past Medical History  Diagnosis Date  . DVT (deep venous thrombosis)     had 2 in right calf & 1 in the left calf  . Cataract   . Clotting disorder   . Anemia   . Hypertension   . Peripheral vascular disease   . Fibromyalgia     Neurologic Exam:  Mental Status:  Alert, oriented X 3. Speech fluent without evidence of aphasia. Able to follow 3 step commands without difficulty.  Mood:anxious  Memory:normal  Thought content appropriate  Cranial Nerves:  II-Visual fields grossly intact.  III/IV/VI-Extraocular movements intact. Pupils reactive bilaterally. Ptosis not present.  V/VII-Smile symmetric  VIII-grossly intact  IX/X-normal gag  XI-bilateral shoulder shrug  XII-midline tongue extension  Motor: 5/5 bilaterally with normal tone and bulk  Sensory: Pinprick and light touch intact throughout, bilaterally  Deep Tendon Reflexes: 2+ and symmetric throughout.  Plantars: Right: downgoing Left: Downgoing  Cerebellar: Normal finger-to-nose, normal rapid alternating movements and normal heel-to-shin test.  Gait: Normal gait and station.    Lab Results: Lab Results  Component Value Date/Time   CHOL 166 05/27/2012  5:20 AM   Lipid Panel  Basename 05/27/12 0520  CHOL 166  TRIG 87  HDL 61  CHOLHDL 2.7  VLDL 17  LDLCALC 88    Studies/Results: Ct Angio Head W/cm &/or Wo Cm  05/27/2012  *RADIOLOGY REPORT*  Clinical  Data:  DIPLOPIA.  PAIN.  UNFOCUSED VISION.  PTOSIS.  CT ANGIOGRAPHY HEAD AND NECK  Technique:  Multidetector CT imaging of the head and neck was performed using the standard protocol during bolus administration of intravenous contrast.  Multiplanar CT image reconstructions including MIPs were obtained to evaluate the vascular anatomy. Carotid stenosis measurements (when applicable) are obtained utilizing NASCET criteria, using the distal internal carotid diameter as the denominator.  Contrast: 50mL OMNIPAQUE IOHEXOL 350 MG/ML SOLN,  Comparison:  CT head without contrast 05/26/2012.  CTA NECK  Findings:  A standard three-vessel arch configuration is present. The origin of the right vertebral artery is duplicated.  There is one segment which enters at to C6-7 and a second segment, slightly more anteriorly, which enters at C5-6.  There are no significant stenoses.  The right vertebral artery is the nondominant vessel. The left vertebral artery originates from the left subclavian artery without significant stenosis.  It is the dominant vessel.  The right common carotid artery is within normal limits.  The bifurcation is unremarkable.  The cervical right ICA is normal.  The left common carotid artery is within normal limits.  The bifurcation is unremarkable.  The left cervical ICA is normal.  Right paratracheal adenopathy is evident.  A nodal mass measures 2.1 x 3.0.  It is at  least 3.1 cm in cephalocaudad dimension.  The disease is incompletely imaged.  No significant axillary adenopathy is present.  There are small supraclavicular nodes on the left. The largest measures 11 x 8 mm with somewhat indistinct margins, raising concern for an metastasis.  The lung apices are clear.  Bone windows demonstrate chronic end plate degenerative changes at C5-6 and C6-7.  Osseous foraminal narrowing is present on the right at C6-7.   Review of the MIP images confirms the above findings.  IMPRESSION:  1.  Right paratracheal adenopathy  with a nodal mass measuring 3.0 x 2.1 by at least 3.1 cm. 2.  Additional suspicious left supraclavicular lymph node. 3.  Normal variant duplicated origin of the nondominant right vertebral artery. 4.  No significant vascular pathology in the neck. 5.  Mild cervical spondylosis.  CTA HEAD  Findings:   The postcontrast images of the brain demonstrate no pathologic enhancement.  The ventricles are of normal size.  No significant extra-axial fluid collection is present.  The left internal carotid artery is within normal limits at the skull base. There is slight fusiform dilation of the right internal carotid artery within the proximal cavernous segment.  The A1 and M1 segments are normal.  There is an early bifurcation of the right middle cerebral artery, a normal variant.  The MCA bifurcations are within normal limits.  The ACA and MCA branch vessels are unremarkable.  The left vertebral artery is the dominant vessel.  Both PICA origins are visualized and within normal limits.  The nondominant right vertebral artery is hypoplastic beyond the PICA.  The posterior cerebral arteries both originate from basilar tip.  A small left posterior communicating artery is present.  The PCA branch vessels are within normal limits.  The dural sinuses fill normally.  The source images demonstrate no focal enhancing lesions or areas of acute ischemia.   Review of the MIP images confirms the above findings.  IMPRESSION:  1.  Slight fusiform dilation of the cavernous right internal carotid artery without a distinct aneurysm. 2.  Otherwise normal CT head.  These results were called by telephone on 05/27/2012  at  08:10 a.m. to  Dr. Dr. Waymon Amato, who verbally acknowledged these results.  Original Report Authenticated By: Jamesetta Orleans. MATTERN, M.D.   Dg Chest 2 View  05/27/2012  *RADIOLOGY REPORT*  Clinical Data: Rule out mass.  CHEST - 2 VIEW  Comparison: 05/28/2006  Findings: Airspace opacity noted in the right midlung.  This could  reflect atelectasis or pneumonia.  On the lateral view, there is suggestion of a rounded area of more centrally.  Recommend chest CT to exclude central obstructing lesion with postobstructive process. Left lung is clear.  Heart is normal size.  No effusions or acute bony abnormality.  IMPRESSION: Airspace opacity in the right midlung.  Cannot exclude central obstructing lesion given the central rounded appearance on the lateral view.  Recommend chest CT with IV contrast for further evaluation.  Original Report Authenticated By: Cyndie Chime, M.D.   Ct Head Wo Contrast  05/26/2012  *RADIOLOGY REPORT*  Clinical Data: Diplopia and pain  CT HEAD WITHOUT CONTRAST  Technique:  Contiguous axial images were obtained from the base of the skull through the vertex without contrast.  Comparison: None  Findings: The brain has a normal appearance without evidence for hemorrhage, infarction, hydrocephalus, or mass lesion.  There is no extra axial fluid collection.  The skull and paranasal sinuses are normal.  IMPRESSION:  1.  Negative  exam.  Original Report Authenticated By: Rosealee Albee, M.D.   Ct Angio Neck W/cm &/or Wo/cm  05/27/2012  *RADIOLOGY REPORT*  Clinical Data:  DIPLOPIA.  PAIN.  UNFOCUSED VISION.  PTOSIS.  CT ANGIOGRAPHY HEAD AND NECK  Technique:  Multidetector CT imaging of the head and neck was performed using the standard protocol during bolus administration of intravenous contrast.  Multiplanar CT image reconstructions including MIPs were obtained to evaluate the vascular anatomy. Carotid stenosis measurements (when applicable) are obtained utilizing NASCET criteria, using the distal internal carotid diameter as the denominator.  Contrast: 50mL OMNIPAQUE IOHEXOL 350 MG/ML SOLN,  Comparison:  CT head without contrast 05/26/2012.  CTA NECK  Findings:  A standard three-vessel arch configuration is present. The origin of the right vertebral artery is duplicated.  There is one segment which enters at to C6-7  and a second segment, slightly more anteriorly, which enters at C5-6.  There are no significant stenoses.  The right vertebral artery is the nondominant vessel. The left vertebral artery originates from the left subclavian artery without significant stenosis.  It is the dominant vessel.  The right common carotid artery is within normal limits.  The bifurcation is unremarkable.  The cervical right ICA is normal.  The left common carotid artery is within normal limits.  The bifurcation is unremarkable.  The left cervical ICA is normal.  Right paratracheal adenopathy is evident.  A nodal mass measures 2.1 x 3.0.  It is at least 3.1 cm in cephalocaudad dimension.  The disease is incompletely imaged.  No significant axillary adenopathy is present.  There are small supraclavicular nodes on the left. The largest measures 11 x 8 mm with somewhat indistinct margins, raising concern for an metastasis.  The lung apices are clear.  Bone windows demonstrate chronic end plate degenerative changes at C5-6 and C6-7.  Osseous foraminal narrowing is present on the right at C6-7.   Review of the MIP images confirms the above findings.  IMPRESSION:  1.  Right paratracheal adenopathy with a nodal mass measuring 3.0 x 2.1 by at least 3.1 cm. 2.  Additional suspicious left supraclavicular lymph node. 3.  Normal variant duplicated origin of the nondominant right vertebral artery. 4.  No significant vascular pathology in the neck. 5.  Mild cervical spondylosis.  CTA HEAD  Findings:   The postcontrast images of the brain demonstrate no pathologic enhancement.  The ventricles are of normal size.  No significant extra-axial fluid collection is present.  The left internal carotid artery is within normal limits at the skull base. There is slight fusiform dilation of the right internal carotid artery within the proximal cavernous segment.  The A1 and M1 segments are normal.  There is an early bifurcation of the right middle cerebral artery, a  normal variant.  The MCA bifurcations are within normal limits.  The ACA and MCA branch vessels are unremarkable.  The left vertebral artery is the dominant vessel.  Both PICA origins are visualized and within normal limits.  The nondominant right vertebral artery is hypoplastic beyond the PICA.  The posterior cerebral arteries both originate from basilar tip.  A small left posterior communicating artery is present.  The PCA branch vessels are within normal limits.  The dural sinuses fill normally.  The source images demonstrate no focal enhancing lesions or areas of acute ischemia.   Review of the MIP images confirms the above findings.  IMPRESSION:  1.  Slight fusiform dilation of the cavernous right internal carotid artery  without a distinct aneurysm. 2.  Otherwise normal CT head.  These results were called by telephone on 05/27/2012  at  08:10 a.m. to  Dr. Dr. Waymon Amato, who verbally acknowledged these results.  Original Report Authenticated By: Jamesetta Orleans. MATTERN, M.D.   Ct Chest W Contrast  05/27/2012  *RADIOLOGY REPORT*  Clinical Data: Right lung mass.  Mediastinal adenopathy.  CT CHEST WITH CONTRAST  Technique:  Multidetector CT imaging of the chest was performed following the standard protocol during bolus administration of intravenous contrast.  Contrast: 80mL OMNIPAQUE IOHEXOL 300 MG/ML  SOLN  Comparison: Chest x-ray dated 05/26/2012  Findings: The patient has a tumor at the confluence of the right upper, middle, and lower lobes.  The tumor measures 3.6 x 2.7 x 1.8 cm.  The tumors primarily in the middle lobe but there is a metastatic lymph node in the hilum of the right lower lobe measuring 1.4 x 1.0 x 0.8 cm.  In addition, there is a 4 mm metastatic nodule in the right upper lobe with the metastatic lymph nodes in the hilum of the right upper lobe measuring 1.2 x 1.4 x 1.0 cm.  There is mediastinal adenopathy with a 4.4 x 2.8 x 1.8 cm azygos lymph nodes and precarinal adenopathy.  There is also a  1.0 x 1.2cm.  lymph node anterior to the superior vena cava adjacent to the ascending thoracic aorta.  The left lung is clear.  No effusions.  Heart size is normal.  No significant osseous abnormality.  The visualized portion of the upper abdomen is normal except for findings of previous cholecystectomy.  IMPRESSION:  1. Tumor in the right middle lobe with spreading of the tumor into the right upper lobe and into the right lower lobe as described. 2.  Mediastinal and hilar adenopathy consistent with metastatic disease.  Original Report Authenticated By: Gwynn Burly, M.D.   Mr Laqueta Jean Wo Contrast  05/27/2012  **ADDENDUM** CREATED: 05/27/2012 15:32:10  Impression:  No intracranial enhancing lesion or bony destructive lesion to suggest presence of intracranial metastatic disease.  No acute infarct noted.  8 mm polypoid lesion in the left posterior-superior nasopharynx. Although this may represent a Thornwaldt cyst, mucosal abnormality not excluded.  Please see above.  **END ADDENDUM** SIGNED BY: Almedia Balls. Constance Goltz, M.D.   05/27/2012  *RADIOLOGY REPORT*  Clinical Data: Diplopia which has resolved. Chest mass/adenopathy.  MRI HEAD WITHOUT AND WITH CONTRAST  Technique:  Multiplanar, multiecho pulse sequences of the brain and surrounding structures were obtained according to standard protocol without and with intravenous contrast  Contrast:  13 ml MultiHance  Comparison: 05/27/2012 CT angiogram.  No comparison brain MR.  Findings: Slight asymmetry of the superior ophthalmic vein with the right side larger than the left however, no findings of cavernous sinus abnormality detected as a cause for this finding.  Remainder of the orbital structures appear unremarkable.  No intracranial enhancing lesion or bony destructive lesion to suggest presence of intracranial metastatic disease.  Artifact mid brain on diffusion sequence without discrete acute infarct noted.  No intracranial hemorrhage.  Minimal nonspecific white matter  type changes.  Mild global atrophy without hydrocephalus.  Major intracranial vascular structures are patent.  Polypoid opacification right frontal sinus with mild mucosal thickening ethmoid sinus air cells.  8 mm polypoid lesion in the left posterior-superior nasopharynx. Although this may represent a Thornwaldt cyst, mucosal abnormality not excluded.  No intracranial enhancing lesion or bony destructive lesion to suggest presence of intracranial metastatic disease.  No  acute infarct noted.  8 mm polypoid lesion in the left posterior-superior nasopharynx. Although this may represent a Thornwaldt cyst, mucosal abnormality not excluded.  Please see above. Original Report Authenticated By: Fuller Canada, M.D.    Medications:     Scheduled:   . fentaNYL      . hydrochlorothiazide  25 mg Oral Daily  . LORazepam  1 mg Intravenous Once  . midazolam      . pneumococcal 23 valent vaccine  0.5 mL Intramuscular Tomorrow-1000  . potassium chloride  40 mEq Oral Once  . simvastatin  20 mg Oral q1800  . Warfarin - Pharmacist Dosing Inpatient   Does not apply q1800  . DISCONTD: warfarin  5 mg Oral ONCE-1800    Assessment/Plan:  65 year old female with recurrent DVT on chronic coumadin with supra therapeutic INR. Now with intermittent episodes of subjective, binocular horizontal diplopia, intermittent right eye ptosis.  TSH is normal Ach antibodies  Pending MRI brain with contrast shows no tumor/stroke or intracranial abnormalities.   Recent CT chest shows tumor in the right middle lobe with spreading of the tumor into the right upper lobe and into the right lower lobe suspicious for metastatic bronchogenic carcinoma: Biopsy by interventional radiology, once INR has improved to less than 1.5. Internal medicine following.   Etiology of diplopia still unclear.  All results have returned normal except for pending acetylcholine antibodies.   Will continue to follow antibodies.  No further recommendations  at this time.   Felicie Morn PA-C [facility] [phone]  05/28/2012, 8:32 AM

## 2012-05-28 NOTE — Progress Notes (Signed)
ANTICOAGULATION CONSULT NOTE - Follow Up  Pharmacy Consult for Warfarin and Heparin Indication: DVT  Labs:  Basename 05/28/2012 05:00 05/27/12 0520 05/26/12 1727 05/26/12 1418  HGB  14.1 -- 15.7*  HCT  41.0 -- 43.8  PLT  228 -- 267  LABPROT 36.3 39.5* 32.9* --  INR 3.58 3.99* 3.16* --  CREATININE 0.74 0.76 -- 0.88   Assessment: 66 yof on chronic coumadin for history of DVT. INR today (3.58) is  supratherapeutic and above desired goal range of INR 2-3. Patient is planned for needle biopsy to evaluate mass.  She will be started on IV Heparin once INR around 2.0 for bridge therapy.  No bleeding complications this morning noted.  She will not need Heparin today.  Home Regimen:   Warfarin (5mg  tabs) - She takes 10 mg on M/F and 7.5mg  on all other days.  Goal of Therapy:  INR 2-3 Monitor platelets by anticoagulation protocol: Yes   Plan:  1.  Hold Warfarin until after biopsy 2. Begin IV heparin when INR ~2.0 3.   Monitor for s/s of bleeding complications 4. F/U labs   Allergies  Allergen Reactions  . Codeine Other (See Comments)    Extreme sleepiness  . Morphine Other (See Comments)    Extreme sleepiness   Vital Signs: Temp: 98.7 F (37.1 C) (07/03 0500) BP: 103/71 mmHg (07/03 0500) Pulse Rate: 77  (07/03 0500)  Estimated Creatinine Clearance: 62.7 ml/min (by C-G formula based on Cr of 0.74).  Medical History: Past Medical History  Diagnosis Date  . DVT (deep venous thrombosis)     had 2 in right calf & 1 in the left calf  . Cataract   . Clotting disorder   . Anemia   . Hypertension   . Peripheral vascular disease   . Fibromyalgia    Nadara Mustard, PharmD., MS Clinical Pharmacist Pager:  [phone]  Thank you for allowing pharmacy to be part of this patients care team. 05/27/2012,8:27 AM

## 2012-05-29 LAB — BASIC METABOLIC PANEL
BUN: 9 mg/dL (ref 6–23)
Chloride: 107 mEq/L (ref 96–112)
Creatinine, Ser: 0.76 mg/dL (ref 0.50–1.10)
GFR calc Af Amer: >90 mL/min (ref >90)
Glucose, Bld: 93 mg/dL (ref 70–99)
Potassium: 3.2 mEq/L — ABNORMAL LOW (ref 3.5–5.1)

## 2012-05-29 LAB — PROTIME-INR: Prothrombin Time: 16.2 seconds — ABNORMAL HIGH (ref 11.6–15.2)

## 2012-05-29 MED ORDER — POTASSIUM CHLORIDE CRYS ER 20 MEQ PO TBCR
40.0000 meq | EXTENDED_RELEASE_TABLET | Freq: Once | ORAL | Status: AC
  Administered 2012-05-29: 40 meq via ORAL
  Filled 2012-05-29: qty 2

## 2012-05-29 MED ORDER — HEPARIN BOLUS VIA INFUSION
1000.0000 [IU] | Freq: Once | INTRAVENOUS | Status: AC
  Administered 2012-05-29: 1000 [IU] via INTRAVENOUS
  Filled 2012-05-29: qty 1000

## 2012-05-29 MED ORDER — HEPARIN (PORCINE) IN NACL 100-0.45 UNIT/ML-% IJ SOLN
900.0000 [IU]/h | INTRAMUSCULAR | Status: DC
  Administered 2012-05-29 (×2): 1000 [IU]/h via INTRAVENOUS
  Filled 2012-05-29 (×2): qty 250

## 2012-05-29 MED ORDER — PNEUMOCOCCAL VAC POLYVALENT 25 MCG/0.5ML IJ INJ
0.5000 mL | INJECTION | INTRAMUSCULAR | Status: AC
  Administered 2012-05-29: 0.5 mL via INTRAMUSCULAR
  Filled 2012-05-29: qty 0.5

## 2012-05-29 NOTE — Progress Notes (Signed)
ANTICOAGULATION CONSULT NOTE - Follow Up  Pharmacy Consult for Warfarin and Heparin Indication: DVT  Labs:  Basename 05/28/2012 05:00 05/27/12 0520 05/26/12 1727 05/26/12 1418  HGB  14.1 -- 15.7*  HCT  41.0 -- 43.8  PLT  228 -- 267  LABPROT 36.3 39.5* 32.9* --  INR 3.58 3.99* 3.16* --  CREATININE 0.74 0.76 -- 0.88   Assessment: 66 yof on chronic coumadin for history of DVT. INR today 1.27 is  subtherapeutic.Marland Kitchen Patient is planned for needle biopsy to evaluate mass.  She will be started on IV Heparin today as INR <2.0.  No bleeding complications this morning noted.    Home Regimen:   Warfarin (5mg  tabs) - She takes 10 mg on M/F and 7.5mg  on all other days.  Goal of Therapy:  Heparin level 0.3-0.7 units/ml INR 2-3 Monitor platelets by anticoagulation protocol: Yes   Plan:  1.  Hold Warfarin until after biopsy 2. Start heparin drip at 1000 units/hr after a small 1000 unit bolus.  Check heparin level 8 hours after start and daily while on heparin. 3.   Monitor for s/s of bleeding complications   Allergies  Allergen Reactions  . Codeine Other (See Comments)    Extreme sleepiness  . Morphine Other (See Comments)    Extreme sleepiness   Vital Signs: Temp: 98.1 F (36.7 C) (07/04 0400) Temp src: Oral (07/04 0400) BP: 108/70 mmHg (07/04 0448) Pulse Rate: 70  (07/04 0400)  Estimated Creatinine Clearance: 62.7 ml/min (by C-G formula based on Cr of 0.74).  Medical History: Past Medical History  Diagnosis Date  . DVT (deep venous thrombosis)     had 2 in right calf & 1 in the left calf  . Cataract   . Clotting disorder   . Anemia   . Hypertension   . Peripheral vascular disease   . Fibromyalgia    Talbert Cage PharmD. Clinical Pharmacist Pager:  [phone] Thank you for allowing pharmacy to be part of this patients care team. 05/27/2012,8:27 AM

## 2012-05-29 NOTE — Progress Notes (Signed)
Patient ID: [name], female   DOB: August 05, 1946, 66 y.o.   MRN: [id number] Pt tent scheduled for CT guided right lung mass biopsy on 7/5. PT/INR today are 16.2/1.27. Pt currently on IV heparin, which will need to be held 4 hours prior to biopsy. If pt tolerates biopsy without complications, she could be placed on lovenox the following day. Plans for above were d/w pt and questions answered. Consent has been signed.

## 2012-05-29 NOTE — Progress Notes (Signed)
ANTICOAGULATION CONSULT NOTE - Follow Up  Pharmacy Consult for Warfarin and Heparin Indication: DVT  Labs:   Basename 05/29/12 1522 05/29/12 0536 05/28/12 0500 05/27/12 0520  HGB -- -- -- 14.1  HCT -- -- -- 41.0  PLT -- -- -- 228  APTT -- -- -- --  LABPROT -- 16.2* 36.3* 39.5*  INR -- 1.27 3.58* 3.99*  HEPARINUNFRC 0.52 -- -- --  CREATININE -- 0.76 0.74 0.76  CKTOTAL -- -- -- --  CKMB -- -- -- --  TROPONINI -- -- -- --    Estimated Creatinine Clearance: 62.7 ml/min (by C-G formula based on Cr of 0.76).  Assessment: 66 yof on chronic coumadin for history of DVT. INR today 1.27 is  subtherapeutic.Marland Kitchen Patient is planned for needle biopsy to evaluate mass.  Heparin reported at goal on 1000/hr.  No bleeding noted, VSS.  Home Regimen:   Warfarin (5mg  tabs) - She takes 10 mg on M/F and 7.5mg  on all other days.  Goal of Therapy:  Heparin level 0.3-0.7 units/ml INR 2-3 Monitor platelets by anticoagulation protocol: Yes   Plan:  1.  Hold Warfarin until after biopsy, scheduled 7/5 2. Continue heparin at 1000 units/hr, change heparin levels to daily. 3.   Monitor for s/s of bleeding complications   Allergies  Allergen Reactions  . Codeine Other (See Comments)    Extreme sleepiness  . Morphine Other (See Comments)    Extreme sleepiness   Vital Signs: Temp: 98.3 F (36.8 C) (07/04 1200) Temp src: Oral (07/04 1200) BP: 131/84 mmHg (07/04 1200) Pulse Rate: 63  (07/04 1200)  Estimated Creatinine Clearance: 62.7 ml/min (by C-G formula based on Cr of 0.76).  Medical History: Past Medical History  Diagnosis Date  . DVT (deep venous thrombosis)     had 2 in right calf & 1 in the left calf  . Cataract   . Clotting disorder   . Anemia   . Hypertension   . Peripheral vascular disease   . Fibromyalgia    Thank you for allowing pharmacy to be a part of this patients care team.  Lovenia Kim Pharm.D., BCPS Clinical Pharmacist 05/29/2012 4:05 PM Pager: [phone] Phone:  [phone]

## 2012-05-29 NOTE — Progress Notes (Signed)
[facility]                                                                                Patient Demographics  [name], is a 66 y.o. female  ZOX:[id number]  [id number]  DOB - [birth year]  Admit date - 05/26/2012  Admitting Physician Lydia Clos, MD  Outpatient Primary MD for the patient is Lydia Salisbury, MD  LOS - 3   Chief Complaint  Patient presents with  . Diplopia  . Eye Pain        Subjective:   [name] today has, No headache, No chest pain, No abdominal pain - No Nausea, No new weakness tingling or numbness, No Cough - SOB.    Objective:   Filed Vitals:   05/29/12 0000 05/29/12 0400 05/29/12 0448 05/29/12 0800  BP: 107/69 91/61 108/70 117/80  Pulse: 72 70  62  Temp: 98.6 F (37 C) 98.1 F (36.7 C)  97.6 F (36.4 C)  TempSrc: Oral Oral  Oral  Resp: 16 17  20   Height:      Weight:      SpO2: 98% 97%  99%    Wt Readings from Last 3 Encounters:  05/26/12 65.1 kg (143 lb 8.3 oz)  05/15/12 67.132 kg (148 lb)  01/02/12 68.04 kg (150 lb)    No intake or output data in the 24 hours ending 05/29/12 0957  Exam Awake Alert, Oriented *3, No new F.N deficits, Normal affect [name].AT,PERRAL Supple Neck,No JVD, No cervical lymphadenopathy appriciated.  Symmetrical Chest wall movement, Good air movement bilaterally, CTAB RRR,No Gallops,Rubs or new Murmurs, No Parasternal Heave +ve B.Sounds, Abd Soft, Non tender, No organomegaly appriciated, No rebound -guarding or rigidity. No Cyanosis, Clubbing or edema, No new Rash or bruise     Data Review  CBC  Lab 05/27/12 0520 05/26/12 1418  WBC 5.5 6.5  HGB 14.1 15.7*  HCT 41.0 43.8  PLT 228 267  MCV 87.0 87.3  MCH 29.9 31.3  MCHC 34.4 35.8  RDW 13.2 13.4  LYMPHSABS -- 1.4  MONOABS -- 0.4  EOSABS -- 0.1  BASOSABS -- 0.0  BANDABS -- --    Chemistries   Lab 05/29/12 0536 05/28/12 0500 05/27/12 0520 05/26/12 1418  NA 142 140 138 140  K 3.2* 3.8 3.2* 3.6  CL 107  107 103 100  CO2 24 24 26 28   GLUCOSE 93 89 100* 100*  BUN 9 7 9 14   CREATININE 0.76 0.74 0.76 0.88  CALCIUM 9.0 8.9 9.3 10.3  MG -- -- -- --  AST -- -- 14 17  ALT -- -- 11 12  ALKPHOS -- -- 41 52  BILITOT -- -- 0.3 0.2*   ------------------------------------------------------------------------------------------------------------------ estimated creatinine clearance is 62.7 ml/min (by C-G formula based on Cr of 0.76). ------------------------------------------------------------------------------------------------------------------  [facility] 05/26/12 2353  HGBA1C 5.9*   ------------------------------------------------------------------------------------------------------------------  Basename 05/27/12 0520  CHOL 166  HDL 61  LDLCALC 88  TRIG 87  CHOLHDL 2.7  LDLDIRECT --   ------------------------------------------------------------------------------------------------------------------  Basename 05/26/12 2353  TSH 2.980  T4TOTAL --  T3FREE --  THYROIDAB --   ------------------------------------------------------------------------------------------------------------------ No results found for this basename: VITAMINB12:2,FOLATE:2,FERRITIN:2,TIBC:2,IRON:2,RETICCTPCT:2 in the last  72 hours  Coagulation profile  Lab 05/29/12 0536 05/28/12 0500 05/27/12 0520 05/26/12 1727  INR 1.27 3.58* 3.99* 3.16*  PROTIME -- -- -- --    No results found for this basename: DDIMER:2 in the last 72 hours  Cardiac Enzymes No results found for this basename: CK:3,CKMB:3,TROPONINI:3,MYOGLOBIN:3 in the last 168 hours ------------------------------------------------------------------------------------------------------------------ No components found with this basename: POCBNP:3  Micro Results No results found for this or any previous visit (from the past 240 hour(s)).  Radiology Reports Ct Angio Head W/cm &/or Wo Cm  05/27/2012  *RADIOLOGY REPORT*  Clinical Data:  DIPLOPIA.  PAIN.   UNFOCUSED VISION.  PTOSIS.  CT ANGIOGRAPHY HEAD AND NECK  Technique:  Multidetector CT imaging of the head and neck was performed using the standard protocol during bolus administration of intravenous contrast.  Multiplanar CT image reconstructions including MIPs were obtained to evaluate the vascular anatomy. Carotid stenosis measurements (when applicable) are obtained utilizing NASCET criteria, using the distal internal carotid diameter as the denominator.  Contrast: 50mL OMNIPAQUE IOHEXOL 350 MG/ML SOLN,  Comparison:  CT head without contrast 05/26/2012.  CTA NECK  Findings:  A standard three-vessel arch configuration is present. The origin of the right vertebral artery is duplicated.  There is one segment which enters at to C6-7 and a second segment, slightly more anteriorly, which enters at C5-6.  There are no significant stenoses.  The right vertebral artery is the nondominant vessel. The left vertebral artery originates from the left subclavian artery without significant stenosis.  It is the dominant vessel.  The right common carotid artery is within normal limits.  The bifurcation is unremarkable.  The cervical right ICA is normal.  The left common carotid artery is within normal limits.  The bifurcation is unremarkable.  The left cervical ICA is normal.  Right paratracheal adenopathy is evident.  A nodal mass measures 2.1 x 3.0.  It is at least 3.1 cm in cephalocaudad dimension.  The disease is incompletely imaged.  No significant axillary adenopathy is present.  There are small supraclavicular nodes on the left. The largest measures 11 x 8 mm with somewhat indistinct margins, raising concern for an metastasis.  The lung apices are clear.  Bone windows demonstrate chronic end plate degenerative changes at C5-6 and C6-7.  Osseous foraminal narrowing is present on the right at C6-7.   Review of the MIP images confirms the above findings.  IMPRESSION:  1.  Right paratracheal adenopathy with a nodal mass  measuring 3.0 x 2.1 by at least 3.1 cm. 2.  Additional suspicious left supraclavicular lymph node. 3.  Normal variant duplicated origin of the nondominant right vertebral artery. 4.  No significant vascular pathology in the neck. 5.  Mild cervical spondylosis.  CTA HEAD  Findings:   The postcontrast images of the brain demonstrate no pathologic enhancement.  The ventricles are of normal size.  No significant extra-axial fluid collection is present.  The left internal carotid artery is within normal limits at the skull base. There is slight fusiform dilation of the right internal carotid artery within the proximal cavernous segment.  The A1 and M1 segments are normal.  There is an early bifurcation of the right middle cerebral artery, a normal variant.  The MCA bifurcations are within normal limits.  The ACA and MCA branch vessels are unremarkable.  The left vertebral artery is the dominant vessel.  Both PICA origins are visualized and within normal limits.  The nondominant right vertebral artery is hypoplastic beyond the PICA.  The posterior cerebral arteries both originate from basilar tip.  A small left posterior communicating artery is present.  The PCA branch vessels are within normal limits.  The dural sinuses fill normally.  The source images demonstrate no focal enhancing lesions or areas of acute ischemia.   Review of the MIP images confirms the above findings.  IMPRESSION:  1.  Slight fusiform dilation of the cavernous right internal carotid artery without a distinct aneurysm. 2.  Otherwise normal CT head.  These results were called by telephone on 05/27/2012  at  08:10 a.m. to  Dr. Dr. Waymon Amato, who verbally acknowledged these results.  Original Report Authenticated By: Jamesetta Orleans. MATTERN, M.D.   Dg Chest 2 View  05/27/2012  *RADIOLOGY REPORT*  Clinical Data: Rule out mass.  CHEST - 2 VIEW  Comparison: 05/28/2006  Findings: Airspace opacity noted in the right midlung.  This could reflect atelectasis  or pneumonia.  On the lateral view, there is suggestion of a rounded area of more centrally.  Recommend chest CT to exclude central obstructing lesion with postobstructive process. Left lung is clear.  Heart is normal size.  No effusions or acute bony abnormality.  IMPRESSION: Airspace opacity in the right midlung.  Cannot exclude central obstructing lesion given the central rounded appearance on the lateral view.  Recommend chest CT with IV contrast for further evaluation.  Original Report Authenticated By: Cyndie Chime, M.D.   Ct Head Wo Contrast  05/26/2012  *RADIOLOGY REPORT*  Clinical Data: Diplopia and pain  CT HEAD WITHOUT CONTRAST  Technique:  Contiguous axial images were obtained from the base of the skull through the vertex without contrast.  Comparison: None  Findings: The brain has a normal appearance without evidence for hemorrhage, infarction, hydrocephalus, or mass lesion.  There is no extra axial fluid collection.  The skull and paranasal sinuses are normal.  IMPRESSION:  1.  Negative exam.  Original Report Authenticated By: Rosealee Albee, M.D.   Ct Angio Neck W/cm &/or Wo/cm  05/27/2012  *RADIOLOGY REPORT*  Clinical Data:  DIPLOPIA.  PAIN.  UNFOCUSED VISION.  PTOSIS.  CT ANGIOGRAPHY HEAD AND NECK  Technique:  Multidetector CT imaging of the head and neck was performed using the standard protocol during bolus administration of intravenous contrast.  Multiplanar CT image reconstructions including MIPs were obtained to evaluate the vascular anatomy. Carotid stenosis measurements (when applicable) are obtained utilizing NASCET criteria, using the distal internal carotid diameter as the denominator.  Contrast: 50mL OMNIPAQUE IOHEXOL 350 MG/ML SOLN,  Comparison:  CT head without contrast 05/26/2012.  CTA NECK  Findings:  A standard three-vessel arch configuration is present. The origin of the right vertebral artery is duplicated.  There is one segment which enters at to C6-7 and a second segment,  slightly more anteriorly, which enters at C5-6.  There are no significant stenoses.  The right vertebral artery is the nondominant vessel. The left vertebral artery originates from the left subclavian artery without significant stenosis.  It is the dominant vessel.  The right common carotid artery is within normal limits.  The bifurcation is unremarkable.  The cervical right ICA is normal.  The left common carotid artery is within normal limits.  The bifurcation is unremarkable.  The left cervical ICA is normal.  Right paratracheal adenopathy is evident.  A nodal mass measures 2.1 x 3.0.  It is at least 3.1 cm in cephalocaudad dimension.  The disease is incompletely imaged.  No significant axillary adenopathy is present.  There  are small supraclavicular nodes on the left. The largest measures 11 x 8 mm with somewhat indistinct margins, raising concern for an metastasis.  The lung apices are clear.  Bone windows demonstrate chronic end plate degenerative changes at C5-6 and C6-7.  Osseous foraminal narrowing is present on the right at C6-7.   Review of the MIP images confirms the above findings.  IMPRESSION:  1.  Right paratracheal adenopathy with a nodal mass measuring 3.0 x 2.1 by at least 3.1 cm. 2.  Additional suspicious left supraclavicular lymph node. 3.  Normal variant duplicated origin of the nondominant right vertebral artery. 4.  No significant vascular pathology in the neck. 5.  Mild cervical spondylosis.  CTA HEAD  Findings:   The postcontrast images of the brain demonstrate no pathologic enhancement.  The ventricles are of normal size.  No significant extra-axial fluid collection is present.  The left internal carotid artery is within normal limits at the skull base. There is slight fusiform dilation of the right internal carotid artery within the proximal cavernous segment.  The A1 and M1 segments are normal.  There is an early bifurcation of the right middle cerebral artery, a normal variant.  The MCA  bifurcations are within normal limits.  The ACA and MCA branch vessels are unremarkable.  The left vertebral artery is the dominant vessel.  Both PICA origins are visualized and within normal limits.  The nondominant right vertebral artery is hypoplastic beyond the PICA.  The posterior cerebral arteries both originate from basilar tip.  A small left posterior communicating artery is present.  The PCA branch vessels are within normal limits.  The dural sinuses fill normally.  The source images demonstrate no focal enhancing lesions or areas of acute ischemia.   Review of the MIP images confirms the above findings.  IMPRESSION:  1.  Slight fusiform dilation of the cavernous right internal carotid artery without a distinct aneurysm. 2.  Otherwise normal CT head.  These results were called by telephone on 05/27/2012  at  08:10 a.m. to  Dr. Dr. Waymon Amato, who verbally acknowledged these results.  Original Report Authenticated By: Jamesetta Orleans. MATTERN, M.D.   Ct Chest W Contrast  05/27/2012  *RADIOLOGY REPORT*  Clinical Data: Right lung mass.  Mediastinal adenopathy.  CT CHEST WITH CONTRAST  Technique:  Multidetector CT imaging of the chest was performed following the standard protocol during bolus administration of intravenous contrast.  Contrast: 80mL OMNIPAQUE IOHEXOL 300 MG/ML  SOLN  Comparison: Chest x-ray dated 05/26/2012  Findings: The patient has a tumor at the confluence of the right upper, middle, and lower lobes.  The tumor measures 3.6 x 2.7 x 1.8 cm.  The tumors primarily in the middle lobe but there is a metastatic lymph node in the hilum of the right lower lobe measuring 1.4 x 1.0 x 0.8 cm.  In addition, there is a 4 mm metastatic nodule in the right upper lobe with the metastatic lymph nodes in the hilum of the right upper lobe measuring 1.2 x 1.4 x 1.0 cm.  There is mediastinal adenopathy with a 4.4 x 2.8 x 1.8 cm azygos lymph nodes and precarinal adenopathy.  There is also a 1.0 x 1.2cm.  lymph node  anterior to the superior vena cava adjacent to the ascending thoracic aorta.  The left lung is clear.  No effusions.  Heart size is normal.  No significant osseous abnormality.  The visualized portion of the upper abdomen is normal except for findings of previous  cholecystectomy.  IMPRESSION:  1. Tumor in the right middle lobe with spreading of the tumor into the right upper lobe and into the right lower lobe as described. 2.  Mediastinal and hilar adenopathy consistent with metastatic disease.  Original Report Authenticated By: Gwynn Burly, M.D.   Mr Laqueta Jean Wo Contrast  05/27/2012  **ADDENDUM** CREATED: 05/27/2012 15:32:10  Impression:  No intracranial enhancing lesion or bony destructive lesion to suggest presence of intracranial metastatic disease.  No acute infarct noted.  8 mm polypoid lesion in the left posterior-superior nasopharynx. Although this may represent a Thornwaldt cyst, mucosal abnormality not excluded.  Please see above.  **END ADDENDUM** SIGNED BY: Almedia Balls. Constance Goltz, M.D.   05/27/2012  *RADIOLOGY REPORT*  Clinical Data: Diplopia which has resolved. Chest mass/adenopathy.  MRI HEAD WITHOUT AND WITH CONTRAST  Technique:  Multiplanar, multiecho pulse sequences of the brain and surrounding structures were obtained according to standard protocol without and with intravenous contrast  Contrast:  13 ml MultiHance  Comparison: 05/27/2012 CT angiogram.  No comparison brain MR.  Findings: Slight asymmetry of the superior ophthalmic vein with the right side larger than the left however, no findings of cavernous sinus abnormality detected as a cause for this finding.  Remainder of the orbital structures appear unremarkable.  No intracranial enhancing lesion or bony destructive lesion to suggest presence of intracranial metastatic disease.  Artifact mid brain on diffusion sequence without discrete acute infarct noted.  No intracranial hemorrhage.  Minimal nonspecific white matter type changes.  Mild global  atrophy without hydrocephalus.  Major intracranial vascular structures are patent.  Polypoid opacification right frontal sinus with mild mucosal thickening ethmoid sinus air cells.  8 mm polypoid lesion in the left posterior-superior nasopharynx. Although this may represent a Thornwaldt cyst, mucosal abnormality not excluded.  No intracranial enhancing lesion or bony destructive lesion to suggest presence of intracranial metastatic disease.  No acute infarct noted.  8 mm polypoid lesion in the left posterior-superior nasopharynx. Although this may represent a Thornwaldt cyst, mucosal abnormality not excluded.  Please see above. Original Report Authenticated By: Fuller Canada, M.D.    Scheduled Meds:    . heparin  1,000 Units Intravenous Once  . hydrochlorothiazide  25 mg Oral Daily  . LORazepam  1 mg Intravenous Once  . phytonadione (VITAMIN K) IV  2 mg Intravenous Once  . pneumococcal 23 valent vaccine  0.5 mL Intramuscular Tomorrow-1000  . potassium chloride  40 mEq Oral Once  . simvastatin  20 mg Oral q1800  . Warfarin - Pharmacist Dosing Inpatient   Does not apply q1800   Continuous Infusions:    . sodium chloride 100 mL/hr at 05/29/12 0832  . heparin 1,000 Units/hr (05/29/12 0842)   PRN Meds:.acetaminophen, fentaNYL, hydrALAZINE, midazolam, sodium chloride  Assessment & Plan    1. Right lung mass, suspicious for metastatic bronchogenic carcinoma: Biopsy by interventional radiology, INR has improved to less than 1.5. Patient would prefer Dr. Truett Perna as her oncologist- outpt follow post biopsy, for now will be placed on heparin drip, will request IR to please comment if patient appropriate for full dose Lovenox after the procedure until INR becomes therapeutic so that she could be discharged.   2. Hypokalemia: Repleted and recheck in am.    3. Recurrent DVT- currently off of Coumadin for the procedure, INR is under 1.5, heparin drip has been initiated. Will request IR to please  comment if patient appropriate for full dose Lovenox after the procedure until INR becomes  therapeutic so that she could be discharged.    4. Hypertension: Controlled.    5. Diplopia: Unclear etiology. No significant findings on MRI brain. Neurology consultation appreciated.Acytcholinestratse Antibodies -ve, no further neurologic workup, outpatient neurology followup.    6. Nonspecific CT and GU and MRI findings- followup outpatient primary care physician will suggest one-time followup with ENT and vascular surgery.      DVT Prophylaxis  Coumadin  Procedures CT chest, CT Head, Ct Angi Neck-Head, MR Brain  Consults Dairl Ponder K M.D on 05/29/2012 at 9:57 AM  Between 7am to 7pm - Pager - [phone]  After 7pm go to [email or web address] - password [email or web address]  And look for the night coverage person covering for me after hours  [facility] Office  [phone]

## 2012-05-30 ENCOUNTER — Encounter (HOSPITAL_COMMUNITY): Payer: Self-pay | Admitting: Respiratory Therapy

## 2012-05-30 ENCOUNTER — Telehealth: Payer: Self-pay | Admitting: Oncology

## 2012-05-30 ENCOUNTER — Inpatient Hospital Stay (HOSPITAL_COMMUNITY): Payer: Medicare Other

## 2012-05-30 ENCOUNTER — Encounter (HOSPITAL_COMMUNITY)
Admission: EM | Disposition: A | Discharge status: Home / Self Care | Payer: Self-pay | Source: Home / Self Care | Attending: Internal Medicine

## 2012-05-30 DIAGNOSIS — R222 Localized swelling, mass and lump, trunk: Secondary | ICD-10-CM

## 2012-05-30 HISTORY — PX: VIDEO BRONCHOSCOPY: SHX5072

## 2012-05-30 LAB — CBC
HCT: 35.9 % — ABNORMAL LOW (ref 36.0–46.0)
Hemoglobin: 12.3 g/dL (ref 12.0–15.0)
MCH: 29.7 pg (ref 26.0–34.0)
MCHC: 34.3 g/dL (ref 30.0–36.0)
MCV: 86.7 fL (ref 78.0–100.0)
Platelets: 237 K/uL (ref 150–400)
RBC: 4.14 MIL/uL (ref 3.87–5.11)
RDW: 13.1 % (ref 11.5–15.5)
WBC: 5.7 K/uL (ref 4.0–10.5)

## 2012-05-30 LAB — APTT: aPTT: 97 s — ABNORMAL HIGH (ref 24–37)

## 2012-05-30 LAB — HEPARIN LEVEL (UNFRACTIONATED): Heparin Unfractionated: 0.71 [IU]/mL — ABNORMAL HIGH (ref 0.30–0.70)

## 2012-05-30 LAB — POTASSIUM: Potassium: 3.4 mEq/L — ABNORMAL LOW (ref 3.5–5.1)

## 2012-05-30 LAB — PROTIME-INR
INR: 1.13 (ref 0.00–1.49)
Prothrombin Time: 14.7 s (ref 11.6–15.2)

## 2012-05-30 SURGERY — BRONCHOSCOPY, WITH FLUOROSCOPY
Anesthesia: Moderate Sedation

## 2012-05-30 MED ORDER — SODIUM CHLORIDE 0.9 % IV SOLN
INTRAVENOUS | Status: DC
  Administered 2012-05-30: 13:00:00 via INTRAVENOUS

## 2012-05-30 MED ORDER — PHENYLEPHRINE HCL 0.25 % NA SOLN
NASAL | Status: DC | PRN
  Administered 2012-05-30: 1 via NASAL

## 2012-05-30 MED ORDER — LIDOCAINE HCL 1 % IJ SOLN
INTRAMUSCULAR | Status: DC | PRN
  Administered 2012-05-30: 6 mL via RESPIRATORY_TRACT

## 2012-05-30 MED ORDER — BUTAMBEN-TETRACAINE-BENZOCAINE 2-2-14 % EX AERO
1.0000 | INHALATION_SPRAY | Freq: Once | CUTANEOUS | Status: DC
  Filled 2012-05-30: qty 56

## 2012-05-30 MED ORDER — LIDOCAINE HCL 2 % EX GEL
CUTANEOUS | Status: DC | PRN
  Administered 2012-05-30: 1

## 2012-05-30 MED ORDER — FENTANYL CITRATE 0.05 MG/ML IJ SOLN
INTRAMUSCULAR | Status: AC
  Filled 2012-05-30: qty 4

## 2012-05-30 MED ORDER — POTASSIUM CHLORIDE 10 MEQ/100ML IV SOLN
10.0000 meq | INTRAVENOUS | Status: AC
  Administered 2012-05-30: 10 meq via INTRAVENOUS
  Filled 2012-05-30 (×2): qty 100

## 2012-05-30 MED ORDER — MIDAZOLAM HCL 10 MG/2ML IJ SOLN
INTRAMUSCULAR | Status: AC
  Filled 2012-05-30: qty 4

## 2012-05-30 MED ORDER — LIDOCAINE HCL 2 % EX GEL
Freq: Once | CUTANEOUS | Status: DC
  Filled 2012-05-30: qty 5

## 2012-05-30 MED ORDER — FENTANYL CITRATE 0.05 MG/ML IJ SOLN
INTRAMUSCULAR | Status: DC | PRN
  Administered 2012-05-30: 30 ug via INTRAVENOUS

## 2012-05-30 MED ORDER — MIDAZOLAM HCL 10 MG/2ML IJ SOLN
INTRAMUSCULAR | Status: DC | PRN
  Administered 2012-05-30: 1 mg via INTRAVENOUS
  Administered 2012-05-30: 3 mg via INTRAVENOUS

## 2012-05-30 NOTE — Op Note (Signed)
Bronchoscopy Procedure Note  Date of Operation: 05/30/2012  Pre-op Diagnosis: lung mass RML  Post-op Diagnosis: same, prob lung CA  Surgeon: Shan Levans  Anesthesia: Monitored Local Anesthesia with Sedation 5 mg Versed IV  Fentanyl IV  Operation: Flexible fiberoptic bronchoscopy, diagnostic   Findings: Endobronchial lesion in RML lateral segment.  Specimen: Bronch biospy, brush, wash RML.  Wang FNA of carina  Estimated Blood Loss: Minimal  Complications: none  Indications and History: The patient is a 66 y.o. female with RML lung mass, nodules, mediastinal disease.  The risks, benefits, complications, treatment options and expected outcomes were discussed with the patient.  The possibilities of reaction to medication, pulmonary aspiration, perforation of a viscus, bleeding, failure to diagnose a condition and creating a complication requiring transfusion or operation were discussed with the patient who freely signed the consent.    Description of Procedure: The patient was re-examined in the bronchoscopy suite and the site of surgery properly noted/marked.  The patient was identified as Susa Day and the procedure verified as Flexible Fiberoptic Bronchoscopy.  A Time Out was held and the above information confirmed.   After the induction of topical nasopharyngeal anesthesia, the patient was positioned  and the bronchoscope was passed through the R nares. The vocal cords were visualized and  1% buffered lidocaine 5 ml was topically placed onto the cords. The cords were normal and visualized. The scope was then passed into the trachea.  1% buffered lidocaine 5 ml was used topically on the carina.  Careful inspection of the tracheal lumen was accomplished. The scope was sequentially passed into the left main and then left upper and lower bronchi and segmental bronchi.      The scope was then withdrawn and advanced into the right main bronchus and then into the RUL, RML,  and RLL bronchi and segmental bronchi.   Bronchial biopsies , washings and brush from RML  was done and there was three  Specimens.  Wang FNA of carina also performed for cytology.   Endobronchial findings: Endobronchial lesion RML  Trachea: Normal mucosa Carina: Normal mucosa Right main bronchus: Normal mucosa Right upper lobe bronchus: Normal mucosa Right middle lobe bronchus: endobronchial lesion RML lateral segment Right lower  lobe bronchus: Normal mucosa Left main bronchus: Normal mucosa Left upper lobe bronchus: Normal mucosa Left lower lobe bronchus: Normal mucosa  The Patient was taken to the Endoscopy Recovery area in satisfactory condition.  Attestation: I performed the procedure.  Shan Levans

## 2012-05-30 NOTE — Consult Note (Signed)
Name: [name] MRN: [id number] DOB: [birth year]    LOS: 4  Referring Provider:  [facility] Reason for Referral:  Lung mass  PULMONARY / CRITICAL CARE MEDICINE  HPI:  This is a 66 year old female with diplopia.  She has been admitted since 05/26/2012. Workup is shown negative CT scan of the head. CT of the chest has shown evidence for right middle and upper lobe lung mass with mediastinal adenopathy. Initially needle biopsy was to be considered however radiology now feels bronchoscopy is the best approach. Pulmonary is now consult and bronchoscopy is to be performed. Patient has a history of hypercoagulable state on chronic Coumadin. The Coumadin has been held and heparin has been maintained. The patient is currently n.p.o. With diplopia has improved.  Past Medical History  Diagnosis Date  . DVT (deep venous thrombosis)     had 2 in right calf & 1 in the left calf  . Cataract   . Clotting disorder   . Anemia   . Hypertension   . Peripheral vascular disease   . Fibromyalgia    Past Surgical History  Procedure Date  . Appendectomy   . Cholecystectomy   . Tonsillectomy   . Knee arthroscopy     right  . Laminectomy     lumbar  . Colonoscopy     11/28/09 repeat in 10 years  . Back surgery    Prior to Admission medications   Medication Sig Start Date End Date Taking? Authorizing Provider  acetaminophen (TYLENOL) 500 MG tablet Take 500 mg by mouth every 6 (six) hours as needed. For pain   Yes Historical Provider, MD  calcium carbonate (OS-CAL) 600 MG TABS Take 600 mg by mouth daily. + 600 mg Vit D + 400 mg of Magnesium    Yes Historical Provider, MD  CYANOCOBALAMIN IJ Inject 1 application as directed every 30 (thirty) days. Around the 22nd or 23rd of each month   Yes Historical Provider, MD  hydrochlorothiazide (HYDRODIURIL) 25 MG tablet Take 25 mg by mouth daily.   Yes Historical Provider, MD  Potassium 99 MG TABS Take 1 tablet by mouth daily.     Yes Historical  Provider, MD  simvastatin (ZOCOR) 20 MG tablet TAKE 1 TABLET BY MOUTH EVERY DAY 02/15/12  Yes [name], MD  warfarin (COUMADIN) 5 MG tablet Take 7.5-10 mg by mouth daily. 10 mg on Monday and Friday, 7.5 mg all other days   Yes Historical Provider, MD   Allergies Allergies  Allergen Reactions  . Codeine Other (See Comments)    Extreme sleepiness  . Morphine Other (See Comments)    Extreme sleepiness    Family History Family History  Problem Relation Age of Onset  . Anemia      FE deficiency   . Arthritis      family history  . Cancer      colon 1st degree relative <60  . Hypertension      family history  . Multiple sclerosis Sister    Social History  reports that she has been smoking Cigarettes.  She has never used smokeless tobacco. She reports that she does not drink alcohol or use illicit drugs.  Review Of Systems:  11 point review of systems done and is negative except as noted  Brief patient description:  66 year-old female with lung mass  Events Since Admission:   Current Status: All questions answered the patient is stable and ready for bronchoscopy Vital Signs: Temp:  [place] F (  36.7 C)-98.3 F (36.8 C)] 98.2 F (36.8 C) (07/05 0800) Pulse Rate:  [58-73] 66  (07/05 0800) Resp:  [20] 20  (07/05 0800) BP: (108-131)/(67-84) 108/71 mmHg (07/05 0800) SpO2:  [96 %-100 %] 97 % (07/05 0800) Weight:  [71.5 kg (157 lb 10.1 oz)] 71.5 kg (157 lb 10.1 oz) (07/05 0400)  Physical Examination: General:  Alert and oriented in no distress Neuro:  Awake and alert moves all fours no neurologic deficit HEENT:  Normal mucous membranes Neck:  No jugular venous distention neck supple no thyromegaly Cardiovascular:  Regular rate and rhythm normal S1-S2 no S3-S4 Lungs:  Clear Abdomen:  Soft nontender bowel sounds active Musculoskeletal: No joint deformity Skin:  Clear  Principal Problem:  *Diplopia Active Problems:  HYPERLIPIDEMIA  HYPERTENSION  DEEP VENOUS  THROMBOPHLEBITIS, RECURRENT  Lung mass  Hypokalemia   ASSESSMENT AND PLAN  PULMONARY No results found for this basename: PHART:5,PCO2:5,PCO2ART:5,PO2ART:5,HCO3:5,O2SAT:5 in the last 168 hours Ventilator Settings:   CXR:  Right middle lobe and upper lobe lung mass with mediastinal adenopathy CT scan confirms   A:  Right middle and right upper lobe lung mass with mediastinal adenopathy P:   Pursue bronchoscopy today and the patient may be discharged home with Coumadin to be restarted we will follow this patient up in the office next week   Shan Levans, M.D. Pulmonary and Critical Care Medicine [facility] Pager: [phone]  05/30/2012, 10:33 AM

## 2012-05-30 NOTE — Progress Notes (Addendum)
[facility]                                                                                Patient Demographics  [name], is a 66 y.o. female  WGN:[id number]  HQI:[id number]  DOB - 1946-05-24  Admit date - 05/26/2012  Admitting Physician Eduard Clos, MD  Outpatient Primary MD for the patient is [name], MD  LOS - 4   Chief Complaint  Patient presents with  . Diplopia  . Eye Pain        Subjective:   [name] today has, No headache, No chest pain, No abdominal pain - No Nausea, No new weakness tingling or numbness, No Cough - SOB.    Objective:   Filed Vitals:   05/29/12 2000 05/30/12 0001 05/30/12 0400 05/30/12 0800  BP: 126/75 116/69 109/67 108/71  Pulse: 73 62 65 66  Temp: 98.3 F (36.8 C) 98.1 F (36.7 C) 98 F (36.7 C) 98.2 F (36.8 C)  TempSrc: Oral Oral Oral Oral  Resp: 20 20 20 20   Height:   5\' 6"  (1.676 m)   Weight:   71.5 kg (157 lb 10.1 oz)   SpO2: 100% 99% 96% 97%    Wt Readings from Last 3 Encounters:  05/30/12 71.5 kg (157 lb 10.1 oz)  05/30/12 71.5 kg (157 lb 10.1 oz)  05/15/12 67.132 kg (148 lb)     Intake/Output Summary (Last 24 hours) at 05/30/12 0919 Last data filed at 05/29/12 1841  Gross per 24 hour  Intake   1200 ml  Output      0 ml  Net   1200 ml    Exam Awake Alert, Oriented *3, No new F.N deficits, Normal affect [place].AT,PERRAL Supple Neck,No JVD, No cervical lymphadenopathy appriciated.  Symmetrical Chest wall movement, Good air movement bilaterally, CTAB RRR,No Gallops,Rubs or new Murmurs, No Parasternal Heave +ve B.Sounds, Abd Soft, Non tender, No organomegaly appriciated, No rebound -guarding or rigidity. No Cyanosis, Clubbing or edema, No new Rash or bruise     Data Review  CBC  Lab 05/30/12 0510 05/27/12 0520 05/26/12 1418  WBC 5.7 5.5 6.5  HGB 12.3 14.1 15.7*  HCT 35.9* 41.0 43.8  PLT 237 228 267  MCV 86.7 87.0 87.3  MCH 29.7 29.9 31.3  MCHC 34.3 34.4 35.8  RDW  13.1 13.2 13.4  LYMPHSABS -- -- 1.4  MONOABS -- -- 0.4  EOSABS -- -- 0.1  BASOSABS -- -- 0.0  BANDABS -- -- --    Chemistries   Lab 05/30/12 0510 05/29/12 0536 05/28/12 0500 05/27/12 0520 05/26/12 1418  NA -- 142 140 138 140  K 3.4* 3.2* 3.8 3.2* 3.6  CL -- 107 107 103 100  CO2 -- 24 24 26 28   GLUCOSE -- 93 89 100* 100*  BUN -- 9 7 9 14   CREATININE -- 0.76 0.74 0.76 0.88  CALCIUM -- 9.0 8.9 9.3 10.3  MG -- -- -- -- --  AST -- -- -- 14 17  ALT -- -- -- 11 12  ALKPHOS -- -- -- 41 52  BILITOT -- -- -- 0.3 0.2*   ------------------------------------------------------------------------------------------------------------------ estimated creatinine clearance is 69.2  ml/min (by C-G formula based on Cr of 0.76). ------------------------------------------------------------------------------------------------------------------ No results found for this basename: HGBA1C:2 in the last 72 hours ------------------------------------------------------------------------------------------------------------------ No results found for this basename: CHOL:2,HDL:2,LDLCALC:2,TRIG:2,CHOLHDL:2,LDLDIRECT:2 in the last 72 hours ------------------------------------------------------------------------------------------------------------------ No results found for this basename: TSH,T4TOTAL,FREET3,T3FREE,THYROIDAB in the last 72 hours ------------------------------------------------------------------------------------------------------------------ No results found for this basename: VITAMINB12:2,FOLATE:2,FERRITIN:2,TIBC:2,IRON:2,RETICCTPCT:2 in the last 72 hours  Coagulation profile  Lab 05/30/12 0510 05/29/12 0536 05/28/12 0500 05/27/12 0520 05/26/12 1727  INR 1.13 1.27 3.58* 3.99* 3.16*  PROTIME -- -- -- -- --    No results found for this basename: DDIMER:2 in the last 72 hours  Cardiac Enzymes No results found for this basename: CK:3,CKMB:3,TROPONINI:3,MYOGLOBIN:3 in the last 168  hours ------------------------------------------------------------------------------------------------------------------ No components found with this basename: POCBNP:3  Micro Results No results found for this or any previous visit (from the past 240 hour(s)).  Radiology Reports Ct Angio Head W/cm &/or Wo Cm  05/27/2012  *RADIOLOGY REPORT*  Clinical Data:  DIPLOPIA.  PAIN.  UNFOCUSED VISION.  PTOSIS.  CT ANGIOGRAPHY HEAD AND NECK  Technique:  Multidetector CT imaging of the head and neck was performed using the standard protocol during bolus administration of intravenous contrast.  Multiplanar CT image reconstructions including MIPs were obtained to evaluate the vascular anatomy. Carotid stenosis measurements (when applicable) are obtained utilizing NASCET criteria, using the distal internal carotid diameter as the denominator.  Contrast: 50mL OMNIPAQUE IOHEXOL 350 MG/ML SOLN,  Comparison:  CT head without contrast 05/26/2012.  CTA NECK  Findings:  A standard three-vessel arch configuration is present. The origin of the right vertebral artery is duplicated.  There is one segment which enters at to C6-7 and a second segment, slightly more anteriorly, which enters at C5-6.  There are no significant stenoses.  The right vertebral artery is the nondominant vessel. The left vertebral artery originates from the left subclavian artery without significant stenosis.  It is the dominant vessel.  The right common carotid artery is within normal limits.  The bifurcation is unremarkable.  The cervical right ICA is normal.  The left common carotid artery is within normal limits.  The bifurcation is unremarkable.  The left cervical ICA is normal.  Right paratracheal adenopathy is evident.  A nodal mass measures 2.1 x 3.0.  It is at least 3.1 cm in cephalocaudad dimension.  The disease is incompletely imaged.  No significant axillary adenopathy is present.  There are small supraclavicular nodes on the left. The largest  measures 11 x 8 mm with somewhat indistinct margins, raising concern for an metastasis.  The lung apices are clear.  Bone windows demonstrate chronic end plate degenerative changes at C5-6 and C6-7.  Osseous foraminal narrowing is present on the right at C6-7.   Review of the MIP images confirms the above findings.  IMPRESSION:  1.  Right paratracheal adenopathy with a nodal mass measuring 3.0 x 2.1 by at least 3.1 cm. 2.  Additional suspicious left supraclavicular lymph node. 3.  Normal variant duplicated origin of the nondominant right vertebral artery. 4.  No significant vascular pathology in the neck. 5.  Mild cervical spondylosis.  CTA HEAD  Findings:   The postcontrast images of the brain demonstrate no pathologic enhancement.  The ventricles are of normal size.  No significant extra-axial fluid collection is present.  The left internal carotid artery is within normal limits at the skull base. There is slight fusiform dilation of the right internal carotid artery within the proximal cavernous segment.  The A1 and M1 segments are  normal.  There is an early bifurcation of the right middle cerebral artery, a normal variant.  The MCA bifurcations are within normal limits.  The ACA and MCA branch vessels are unremarkable.  The left vertebral artery is the dominant vessel.  Both PICA origins are visualized and within normal limits.  The nondominant right vertebral artery is hypoplastic beyond the PICA.  The posterior cerebral arteries both originate from basilar tip.  A small left posterior communicating artery is present.  The PCA branch vessels are within normal limits.  The dural sinuses fill normally.  The source images demonstrate no focal enhancing lesions or areas of acute ischemia.   Review of the MIP images confirms the above findings.  IMPRESSION:  1.  Slight fusiform dilation of the cavernous right internal carotid artery without a distinct aneurysm. 2.  Otherwise normal CT head.  These results were  called by telephone on 05/27/2012  at  08:10 a.m. to  Dr. Dr. Waymon Amato, who verbally acknowledged these results.  Original Report Authenticated By: Jamesetta Orleans. MATTERN, M.D.   Dg Chest 2 View  05/27/2012  *RADIOLOGY REPORT*  Clinical Data: Rule out mass.  CHEST - 2 VIEW  Comparison: 05/28/2006  Findings: Airspace opacity noted in the right midlung.  This could reflect atelectasis or pneumonia.  On the lateral view, there is suggestion of a rounded area of more centrally.  Recommend chest CT to exclude central obstructing lesion with postobstructive process. Left lung is clear.  Heart is normal size.  No effusions or acute bony abnormality.  IMPRESSION: Airspace opacity in the right midlung.  Cannot exclude central obstructing lesion given the central rounded appearance on the lateral view.  Recommend chest CT with IV contrast for further evaluation.  Original Report Authenticated By: Cyndie Chime, M.D.   Ct Head Wo Contrast  05/26/2012  *RADIOLOGY REPORT*  Clinical Data: Diplopia and pain  CT HEAD WITHOUT CONTRAST  Technique:  Contiguous axial images were obtained from the base of the skull through the vertex without contrast.  Comparison: None  Findings: The brain has a normal appearance without evidence for hemorrhage, infarction, hydrocephalus, or mass lesion.  There is no extra axial fluid collection.  The skull and paranasal sinuses are normal.  IMPRESSION:  1.  Negative exam.  Original Report Authenticated By: Rosealee Albee, M.D.   Ct Angio Neck W/cm &/or Wo/cm  05/27/2012  *RADIOLOGY REPORT*  Clinical Data:  DIPLOPIA.  PAIN.  UNFOCUSED VISION.  PTOSIS.  CT ANGIOGRAPHY HEAD AND NECK  Technique:  Multidetector CT imaging of the head and neck was performed using the standard protocol during bolus administration of intravenous contrast.  Multiplanar CT image reconstructions including MIPs were obtained to evaluate the vascular anatomy. Carotid stenosis measurements (when applicable) are obtained  utilizing NASCET criteria, using the distal internal carotid diameter as the denominator.  Contrast: 50mL OMNIPAQUE IOHEXOL 350 MG/ML SOLN,  Comparison:  CT head without contrast 05/26/2012.  CTA NECK  Findings:  A standard three-vessel arch configuration is present. The origin of the right vertebral artery is duplicated.  There is one segment which enters at to C6-7 and a second segment, slightly more anteriorly, which enters at C5-6.  There are no significant stenoses.  The right vertebral artery is the nondominant vessel. The left vertebral artery originates from the left subclavian artery without significant stenosis.  It is the dominant vessel.  The right common carotid artery is within normal limits.  The bifurcation is unremarkable.  The cervical right  ICA is normal.  The left common carotid artery is within normal limits.  The bifurcation is unremarkable.  The left cervical ICA is normal.  Right paratracheal adenopathy is evident.  A nodal mass measures 2.1 x 3.0.  It is at least 3.1 cm in cephalocaudad dimension.  The disease is incompletely imaged.  No significant axillary adenopathy is present.  There are small supraclavicular nodes on the left. The largest measures 11 x 8 mm with somewhat indistinct margins, raising concern for an metastasis.  The lung apices are clear.  Bone windows demonstrate chronic end plate degenerative changes at C5-6 and C6-7.  Osseous foraminal narrowing is present on the right at C6-7.   Review of the MIP images confirms the above findings.  IMPRESSION:  1.  Right paratracheal adenopathy with a nodal mass measuring 3.0 x 2.1 by at least 3.1 cm. 2.  Additional suspicious left supraclavicular lymph node. 3.  Normal variant duplicated origin of the nondominant right vertebral artery. 4.  No significant vascular pathology in the neck. 5.  Mild cervical spondylosis.  CTA HEAD  Findings:   The postcontrast images of the brain demonstrate no pathologic enhancement.  The ventricles are  of normal size.  No significant extra-axial fluid collection is present.  The left internal carotid artery is within normal limits at the skull base. There is slight fusiform dilation of the right internal carotid artery within the proximal cavernous segment.  The A1 and M1 segments are normal.  There is an early bifurcation of the right middle cerebral artery, a normal variant.  The MCA bifurcations are within normal limits.  The ACA and MCA branch vessels are unremarkable.  The left vertebral artery is the dominant vessel.  Both PICA origins are visualized and within normal limits.  The nondominant right vertebral artery is hypoplastic beyond the PICA.  The posterior cerebral arteries both originate from basilar tip.  A small left posterior communicating artery is present.  The PCA branch vessels are within normal limits.  The dural sinuses fill normally.  The source images demonstrate no focal enhancing lesions or areas of acute ischemia.   Review of the MIP images confirms the above findings.  IMPRESSION:  1.  Slight fusiform dilation of the cavernous right internal carotid artery without a distinct aneurysm. 2.  Otherwise normal CT head.  These results were called by telephone on 05/27/2012  at  08:10 a.m. to  Dr. Dr. Waymon Amato, who verbally acknowledged these results.  Original Report Authenticated By: Jamesetta Orleans. MATTERN, M.D.   Ct Chest W Contrast  05/27/2012  *RADIOLOGY REPORT*  Clinical Data: Right lung mass.  Mediastinal adenopathy.  CT CHEST WITH CONTRAST  Technique:  Multidetector CT imaging of the chest was performed following the standard protocol during bolus administration of intravenous contrast.  Contrast: 80mL OMNIPAQUE IOHEXOL 300 MG/ML  SOLN  Comparison: Chest x-ray dated 05/26/2012  Findings: The patient has a tumor at the confluence of the right upper, middle, and lower lobes.  The tumor measures 3.6 x 2.7 x 1.8 cm.  The tumors primarily in the middle lobe but there is a metastatic lymph  node in the hilum of the right lower lobe measuring 1.4 x 1.0 x 0.8 cm.  In addition, there is a 4 mm metastatic nodule in the right upper lobe with the metastatic lymph nodes in the hilum of the right upper lobe measuring 1.2 x 1.4 x 1.0 cm.  There is mediastinal adenopathy with a 4.4 x 2.8 x 1.8  cm azygos lymph nodes and precarinal adenopathy.  There is also a 1.0 x 1.2cm.  lymph node anterior to the superior vena cava adjacent to the ascending thoracic aorta.  The left lung is clear.  No effusions.  Heart size is normal.  No significant osseous abnormality.  The visualized portion of the upper abdomen is normal except for findings of previous cholecystectomy.  IMPRESSION:  1. Tumor in the right middle lobe with spreading of the tumor into the right upper lobe and into the right lower lobe as described. 2.  Mediastinal and hilar adenopathy consistent with metastatic disease.  Original Report Authenticated By: Gwynn Burly, M.D.   Mr Laqueta Jean Wo Contrast  05/27/2012  **ADDENDUM** CREATED: 05/27/2012 15:32:10  Impression:  No intracranial enhancing lesion or bony destructive lesion to suggest presence of intracranial metastatic disease.  No acute infarct noted.  8 mm polypoid lesion in the left posterior-superior nasopharynx. Although this may represent a Thornwaldt cyst, mucosal abnormality not excluded.  Please see above.  **END ADDENDUM** SIGNED BY: Almedia Balls. Constance Goltz, M.D.   05/27/2012  *RADIOLOGY REPORT*  Clinical Data: Diplopia which has resolved. Chest mass/adenopathy.  MRI HEAD WITHOUT AND WITH CONTRAST  Technique:  Multiplanar, multiecho pulse sequences of the brain and surrounding structures were obtained according to standard protocol without and with intravenous contrast  Contrast:  13 ml MultiHance  Comparison: 05/27/2012 CT angiogram.  No comparison brain MR.  Findings: Slight asymmetry of the superior ophthalmic vein with the right side larger than the left however, no findings of cavernous sinus  abnormality detected as a cause for this finding.  Remainder of the orbital structures appear unremarkable.  No intracranial enhancing lesion or bony destructive lesion to suggest presence of intracranial metastatic disease.  Artifact mid brain on diffusion sequence without discrete acute infarct noted.  No intracranial hemorrhage.  Minimal nonspecific white matter type changes.  Mild global atrophy without hydrocephalus.  Major intracranial vascular structures are patent.  Polypoid opacification right frontal sinus with mild mucosal thickening ethmoid sinus air cells.  8 mm polypoid lesion in the left posterior-superior nasopharynx. Although this may represent a Thornwaldt cyst, mucosal abnormality not excluded.  No intracranial enhancing lesion or bony destructive lesion to suggest presence of intracranial metastatic disease.  No acute infarct noted.  8 mm polypoid lesion in the left posterior-superior nasopharynx. Although this may represent a Thornwaldt cyst, mucosal abnormality not excluded.  Please see above. Original Report Authenticated By: Fuller Canada, M.D.    Scheduled Meds:    . hydrochlorothiazide  25 mg Oral Daily  . LORazepam  1 mg Intravenous Once  . pneumococcal 23 valent vaccine  0.5 mL Intramuscular Tomorrow-1000  . pneumococcal 23 valent vaccine  0.5 mL Intramuscular Tomorrow-1000  . potassium chloride  10 mEq Intravenous Q1 Hr x 2  . potassium chloride  40 mEq Oral Once  . simvastatin  20 mg Oral q1800  . Warfarin - Pharmacist Dosing Inpatient   Does not apply q1800   Continuous Infusions:    . sodium chloride 100 mL/hr at 05/29/12 0832  . heparin Stopped (05/30/12 0728)   PRN Meds:.acetaminophen, fentaNYL, hydrALAZINE, midazolam, sodium chloride  Assessment & Plan    1. Right lung mass, suspicious for metastatic bronchogenic carcinoma: Biopsy by interventional radiology postponed by IR request, IR physician feels that patient would do better with Bronk guided  biopsy, pulmonary has been called , family informed, INR has improved to less than 1.5. Patient would follow with Dr. Truett Perna  as her oncologist- outpt post biopsy, for now will be placed on heparin drip, will request pulmonary to please comment when patient appropriate for full dose Lovenox/Heparin after the procedure until INR becomes therapeutic so that she could be discharged.   2. Hypokalemia: Repleted again and recheck in am.    3. Recurrent DVT- currently off of Coumadin for the procedure, INR is under 1.5, heparin drip has been initiated. Will request pulmonary to please comment if patient appropriate for full dose Lovenox after the procedure until INR becomes therapeutic so that she could be discharged.    4. Hypertension: Controlled.    5. Diplopia: Unclear etiology. No significant findings on MRI brain. Neurology consultation appreciated.Acytcholinestratse Antibodies -ve, no further neurologic workup, outpatient neurology followup.    6. Nonspecific CT and GU and MRI findings- followup outpatient primary care physician will suggest one-time followup with ENT and vascular surgery.      DVT Prophylaxis  Coumadin/Heparin  Procedures CT chest, CT Head, Ct Angi Neck-Head, MR Brain, Bronch guided biopsy to be done  Consults Neuro,IR, pulmonary   Susa Raring K M.D on 05/30/2012 at 9:19 AM  Between 7am to 7pm - Pager - [phone]  After 7pm go to [email or web address] - password [email or web address]  And look for the night coverage person covering for me after hours  [facility] Office  [phone]

## 2012-05-30 NOTE — H&P (Signed)
  See my earlier note. I have reexamined this patient and she is ready for bronchoscopy.  Shan Levans Beeper  [phone]  Cell  [phone]  If no response or cell goes to voicemail, call beeper [phone]

## 2012-05-30 NOTE — Progress Notes (Signed)
o2 saturation good decreased o2 to2L [name]

## 2012-05-30 NOTE — Discharge Summary (Signed)
[facility]                                                                                   [name], is a 66 y.o. female  DOB 10/04/1946  MRN [id number].  Admission date:  05/26/2012  Discharge Date:  05/30/2012  Primary MD  [name], MD  Admitting Physician  Eduard Clos, MD  Admission Diagnosis  Diplopia [368.2] Ataxia [781.3] Diplopia lung mass  Discharge Diagnosis     Principal Problem:  *Diplopia Active Problems:  HYPERLIPIDEMIA  HYPERTENSION  DEEP VENOUS THROMBOPHLEBITIS, RECURRENT  Lung mass  Hypokalemia    Past Medical History  Diagnosis Date  . DVT (deep venous thrombosis)     had 2 in right calf & 1 in the left calf  . Cataract   . Clotting disorder   . Anemia   . Hypertension   . Peripheral vascular disease   . Fibromyalgia     Past Surgical History  Procedure Date  . Appendectomy   . Cholecystectomy   . Tonsillectomy   . Knee arthroscopy     right  . Laminectomy     lumbar  . Colonoscopy     11/28/09 repeat in 10 years  . Back surgery      Hospital Course See H&P, Labs, Consult and Test reports for all details in brief, she was admitted for complaints of diplopia and unsteady gait which completely resolved, reason still unclear, patient had unremarkable CVA workup, her acetylcholine esterase antibody was negative, she was seen by neurology and no further inpatient workup was advised. She can follow outpatient with neurologist in one to 2 weeks to see if any further workup as outpatient is advised, note patient does have family history of multiple sclerosis.   Patient during her hospital stay did have very is imaging studies which revealed a lung mass suspicious for lung tumor she status post bronchoscopy with tissue biopsy results of which should be followed up as outpatient. Patient will follow outpatient with Dr. Elnita Maxwell oncologist along with Dr. Shan Levans lung Dr. to followup on these results and  further treatment   Patient does have history of multiple DVTs in the past, for her Bronk guided lung biopsy Coumadin was held, her INR is subtherapeutic, I discussed the case with Dr. Delford Field who did the procedure, he suggested that patient should be sent resumed on home dose Coumadin with gentle up titration of INR over the next 2-3 days. Patient and her family have been advised to insure the patient is mobile and active at home to prevent further clots in the store duration.  During the routine workup her echogram revealed chronic stable grade 1 diastolic CHF. She's completely compensated during this admission from CHF standpoint. She has been provided fluid restriction and daily weight monitoring instructions.    Nonspecific CT  and MRI findings - followup outpatient primary care physician will suggest one-time followup with ENT and vascular surgery.  Request primary care physician to kindly monitor patient's INR adjust Coumadin dose closely, please insure patient follows up with oncology and pulmonary as outpatient.  Will recommend one time outpatient followup with vascular surgery  and ENT for nonspecific CT and MRI findings as below.  Consults Pulmonary  Significant Tests:  See full reports for all details     Bronchoscopy Procedure Note   Date of Operation: 05/30/2012   Pre-op Diagnosis: lung mass RML  Post-op Diagnosis: same, prob lung CA  Surgeon: Shan Levans  Anesthesia: Monitored Local Anesthesia with Sedation  5 mg Versed IV Fentanyl IV  Operation: Flexible fiberoptic bronchoscopy, diagnostic  Findings: Endobronchial lesion in RML lateral segment.  Specimen: Bronch biospy, brush, wash RML. Wang FNA of carina  Estimated Blood Loss: Minimal  Complications: none   Indications and History:  The patient is a 66 y.o. female with RML lung mass, nodules, mediastinal disease. The risks, benefits, complications, treatment options and expected outcomes were discussed with  the patient. The possibilities of reaction to medication, pulmonary aspiration, perforation of a viscus, bleeding, failure to diagnose a condition and creating a complication requiring transfusion or operation were discussed with the patient who freely signed the consent.   Description of Procedure:   The patient was re-examined in the bronchoscopy suite and the site of surgery properly noted/marked. The patient was identified as [name] and the procedure verified as Flexible Fiberoptic Bronchoscopy. A Time Out was held and the above information confirmed.  After the induction of topical nasopharyngeal anesthesia, the patient was positioned and the bronchoscope was passed through the R nares. The vocal cords were visualized and 1% buffered lidocaine 5 ml was topically placed onto the cords. The cords were normal and visualized. The scope was then passed into the trachea. 1% buffered lidocaine 5 ml was used topically on the carina. Careful inspection of the tracheal lumen was accomplished. The scope was sequentially passed into the left main and then left upper and lower bronchi and segmental bronchi.  The scope was then withdrawn and advanced into the right main bronchus and then into the RUL, RML, and RLL bronchi and segmental bronchi. Bronchial biopsies , washings and brush from RML was done and there was three Specimens. Wang FNA of carina also performed for cytology.  Endobronchial findings: Endobronchial lesion RML  Trachea: Normal mucosa  Carina: Normal mucosa  Right main bronchus: Normal mucosa  Right upper lobe bronchus: Normal mucosa  Right middle lobe bronchus: endobronchial lesion RML lateral segment  Right lower lobe bronchus: Normal mucosa  Left main bronchus: Normal mucosa  Left upper lobe bronchus: Normal mucosa  Left lower lobe bronchus: Normal mucosa  The Patient was taken to the Endoscopy Recovery area in satisfactory condition.    Attestation: I performed the  procedure.   Shan Levans    ARTERIAL  ABI completed:   RIGHT    LEFT     PRESSURE  WAVEFORM   PRESSURE  WAVEFORM   BRACHIAL  127  Triphasic  BRACHIAL  123  Triphasic   DP  128  Triphasic  DP  123  Triphasic          PT  130  Biphasic  PT  133  Triphasic                   RIGHT  LEFT   ABI  1.02  1.05   ABIs and Doppler waveforms are within normal limits bilaterally at rest. Bilateral duplex scan revealed no evidence of stenosis bilaterally.   VASCULAR LAB  PRELIMINARY PRELIMINARY PRELIMINARY PRELIMINARY  Right lower extremity venous duplex completed.  Preliminary report: Right: No evidence of DVT, superficial thrombosis, or Baker's cyst.  Ct Angio Head W/cm &/or Wo Cm  05/27/2012  *RADIOLOGY REPORT*  Clinical Data:  DIPLOPIA.  PAIN.  UNFOCUSED VISION.  PTOSIS.  CT ANGIOGRAPHY HEAD AND NECK  Technique:  Multidetector CT imaging of the head and neck was performed using the standard protocol during bolus administration of intravenous contrast.  Multiplanar CT image reconstructions including MIPs were obtained to evaluate the vascular anatomy. Carotid stenosis measurements (when applicable) are obtained utilizing NASCET criteria, using the distal internal carotid diameter as the denominator.  Contrast: 50mL OMNIPAQUE IOHEXOL 350 MG/ML SOLN,  Comparison:  CT head without contrast 05/26/2012.  CTA NECK  Findings:  A standard three-vessel arch configuration is present. The origin of the right vertebral artery is duplicated.  There is one segment which enters at to C6-7 and a second segment, slightly more anteriorly, which enters at C5-6.  There are no significant stenoses.  The right vertebral artery is the nondominant vessel. The left vertebral artery originates from the left subclavian artery without significant stenosis.  It is the dominant vessel.  The right common carotid artery is within normal limits.  The bifurcation is unremarkable.  The cervical right ICA is normal.  The left  common carotid artery is within normal limits.  The bifurcation is unremarkable.  The left cervical ICA is normal.  Right paratracheal adenopathy is evident.  A nodal mass measures 2.1 x 3.0.  It is at least 3.1 cm in cephalocaudad dimension.  The disease is incompletely imaged.  No significant axillary adenopathy is present.  There are small supraclavicular nodes on the left. The largest measures 11 x 8 mm with somewhat indistinct margins, raising concern for an metastasis.  The lung apices are clear.  Bone windows demonstrate chronic end plate degenerative changes at C5-6 and C6-7.  Osseous foraminal narrowing is present on the right at C6-7.   Review of the MIP images confirms the above findings.  IMPRESSION:  1.  Right paratracheal adenopathy with a nodal mass measuring 3.0 x 2.1 by at least 3.1 cm. 2.  Additional suspicious left supraclavicular lymph node. 3.  Normal variant duplicated origin of the nondominant right vertebral artery. 4.  No significant vascular pathology in the neck. 5.  Mild cervical spondylosis.  CTA HEAD  Findings:   The postcontrast images of the brain demonstrate no pathologic enhancement.  The ventricles are of normal size.  No significant extra-axial fluid collection is present.  The left internal carotid artery is within normal limits at the skull base. There is slight fusiform dilation of the right internal carotid artery within the proximal cavernous segment.  The A1 and M1 segments are normal.  There is an early bifurcation of the right middle cerebral artery, a normal variant.  The MCA bifurcations are within normal limits.  The ACA and MCA branch vessels are unremarkable.  The left vertebral artery is the dominant vessel.  Both PICA origins are visualized and within normal limits.  The nondominant right vertebral artery is hypoplastic beyond the PICA.  The posterior cerebral arteries both originate from basilar tip.  A small left posterior communicating artery is present.  The PCA  branch vessels are within normal limits.  The dural sinuses fill normally.  The source images demonstrate no focal enhancing lesions or areas of acute ischemia.   Review of the MIP images confirms the above findings.  IMPRESSION:  1.  Slight fusiform dilation of the cavernous right internal carotid artery without a distinct aneurysm. 2.  Otherwise normal CT head.  These results were called by telephone on 05/27/2012  at  08:10 a.m. to  Dr. Dr. Waymon Amato, who verbally acknowledged these results.  Original Report Authenticated By: Jamesetta Orleans. MATTERN, M.D.   Dg Chest 2 View  05/27/2012  *RADIOLOGY REPORT*  Clinical Data: Rule out mass.  CHEST - 2 VIEW  Comparison: 05/28/2006  Findings: Airspace opacity noted in the right midlung.  This could reflect atelectasis or pneumonia.  On the lateral view, there is suggestion of a rounded area of more centrally.  Recommend chest CT to exclude central obstructing lesion with postobstructive process. Left lung is clear.  Heart is normal size.  No effusions or acute bony abnormality.  IMPRESSION: Airspace opacity in the right midlung.  Cannot exclude central obstructing lesion given the central rounded appearance on the lateral view.  Recommend chest CT with IV contrast for further evaluation.  Original Report Authenticated By: Cyndie Chime, M.D.   Ct Head Wo Contrast  05/26/2012  *RADIOLOGY REPORT*  Clinical Data: Diplopia and pain  CT HEAD WITHOUT CONTRAST  Technique:  Contiguous axial images were obtained from the base of the skull through the vertex without contrast.  Comparison: None  Findings: The brain has a normal appearance without evidence for hemorrhage, infarction, hydrocephalus, or mass lesion.  There is no extra axial fluid collection.  The skull and paranasal sinuses are normal.  IMPRESSION:  1.  Negative exam.  Original Report Authenticated By: Rosealee Albee, M.D.   Ct Angio Neck W/cm &/or Wo/cm  05/27/2012  *RADIOLOGY REPORT*  Clinical Data:   DIPLOPIA.  PAIN.  UNFOCUSED VISION.  PTOSIS.  CT ANGIOGRAPHY HEAD AND NECK  Technique:  Multidetector CT imaging of the head and neck was performed using the standard protocol during bolus administration of intravenous contrast.  Multiplanar CT image reconstructions including MIPs were obtained to evaluate the vascular anatomy. Carotid stenosis measurements (when applicable) are obtained utilizing NASCET criteria, using the distal internal carotid diameter as the denominator.  Contrast: 50mL OMNIPAQUE IOHEXOL 350 MG/ML SOLN,  Comparison:  CT head without contrast 05/26/2012.  CTA NECK  Findings:  A standard three-vessel arch configuration is present. The origin of the right vertebral artery is duplicated.  There is one segment which enters at to C6-7 and a second segment, slightly more anteriorly, which enters at C5-6.  There are no significant stenoses.  The right vertebral artery is the nondominant vessel. The left vertebral artery originates from the left subclavian artery without significant stenosis.  It is the dominant vessel.  The right common carotid artery is within normal limits.  The bifurcation is unremarkable.  The cervical right ICA is normal.  The left common carotid artery is within normal limits.  The bifurcation is unremarkable.  The left cervical ICA is normal.  Right paratracheal adenopathy is evident.  A nodal mass measures 2.1 x 3.0.  It is at least 3.1 cm in cephalocaudad dimension.  The disease is incompletely imaged.  No significant axillary adenopathy is present.  There are small supraclavicular nodes on the left. The largest measures 11 x 8 mm with somewhat indistinct margins, raising concern for an metastasis.  The lung apices are clear.  Bone windows demonstrate chronic end plate degenerative changes at C5-6 and C6-7.  Osseous foraminal narrowing is present on the right at C6-7.   Review of the MIP images confirms the above findings.  IMPRESSION:  1.  Right paratracheal adenopathy with a  nodal mass measuring 3.0 x 2.1 by at least 3.1  cm. 2.  Additional suspicious left supraclavicular lymph node. 3.  Normal variant duplicated origin of the nondominant right vertebral artery. 4.  No significant vascular pathology in the neck. 5.  Mild cervical spondylosis.  CTA HEAD  Findings:   The postcontrast images of the brain demonstrate no pathologic enhancement.  The ventricles are of normal size.  No significant extra-axial fluid collection is present.  The left internal carotid artery is within normal limits at the skull base. There is slight fusiform dilation of the right internal carotid artery within the proximal cavernous segment.  The A1 and M1 segments are normal.  There is an early bifurcation of the right middle cerebral artery, a normal variant.  The MCA bifurcations are within normal limits.  The ACA and MCA branch vessels are unremarkable.  The left vertebral artery is the dominant vessel.  Both PICA origins are visualized and within normal limits.  The nondominant right vertebral artery is hypoplastic beyond the PICA.  The posterior cerebral arteries both originate from basilar tip.  A small left posterior communicating artery is present.  The PCA branch vessels are within normal limits.  The dural sinuses fill normally.  The source images demonstrate no focal enhancing lesions or areas of acute ischemia.   Review of the MIP images confirms the above findings.  IMPRESSION:  1.  Slight fusiform dilation of the cavernous right internal carotid artery without a distinct aneurysm. 2.  Otherwise normal CT head.  These results were called by telephone on 05/27/2012  at  08:10 a.m. to  Dr. Dr. Waymon Amato, who verbally acknowledged these results.  Original Report Authenticated By: Jamesetta Orleans. MATTERN, M.D.   Ct Chest W Contrast  05/27/2012  *RADIOLOGY REPORT*  Clinical Data: Right lung mass.  Mediastinal adenopathy.  CT CHEST WITH CONTRAST  Technique:  Multidetector CT imaging of the chest was  performed following the standard protocol during bolus administration of intravenous contrast.  Contrast: 80mL OMNIPAQUE IOHEXOL 300 MG/ML  SOLN  Comparison: Chest x-ray dated 05/26/2012  Findings: The patient has a tumor at the confluence of the right upper, middle, and lower lobes.  The tumor measures 3.6 x 2.7 x 1.8 cm.  The tumors primarily in the middle lobe but there is a metastatic lymph node in the hilum of the right lower lobe measuring 1.4 x 1.0 x 0.8 cm.  In addition, there is a 4 mm metastatic nodule in the right upper lobe with the metastatic lymph nodes in the hilum of the right upper lobe measuring 1.2 x 1.4 x 1.0 cm.  There is mediastinal adenopathy with a 4.4 x 2.8 x 1.8 cm azygos lymph nodes and precarinal adenopathy.  There is also a 1.0 x 1.2cm.  lymph node anterior to the superior vena cava adjacent to the ascending thoracic aorta.  The left lung is clear.  No effusions.  Heart size is normal.  No significant osseous abnormality.  The visualized portion of the upper abdomen is normal except for findings of previous cholecystectomy.  IMPRESSION:  1. Tumor in the right middle lobe with spreading of the tumor into the right upper lobe and into the right lower lobe as described. 2.  Mediastinal and hilar adenopathy consistent with metastatic disease.  Original Report Authenticated By: Gwynn Burly, M.D.   Mr Laqueta Jean Wo Contrast  05/27/2012  **ADDENDUM** CREATED: 05/27/2012 15:32:10  Impression:  No intracranial enhancing lesion or bony destructive lesion to suggest presence of intracranial metastatic disease.  No acute infarct noted.  8 mm polypoid lesion in the left posterior-superior nasopharynx. Although this may represent a Thornwaldt cyst, mucosal abnormality not excluded.  Please see above.  **END ADDENDUM** SIGNED BY: Almedia Balls. Constance Goltz, M.D.   05/27/2012  *RADIOLOGY REPORT*  Clinical Data: Diplopia which has resolved. Chest mass/adenopathy.  MRI HEAD WITHOUT AND WITH CONTRAST  Technique:   Multiplanar, multiecho pulse sequences of the brain and surrounding structures were obtained according to standard protocol without and with intravenous contrast  Contrast:  13 ml MultiHance  Comparison: 05/27/2012 CT angiogram.  No comparison brain MR.  Findings: Slight asymmetry of the superior ophthalmic vein with the right side larger than the left however, no findings of cavernous sinus abnormality detected as a cause for this finding.  Remainder of the orbital structures appear unremarkable.  No intracranial enhancing lesion or bony destructive lesion to suggest presence of intracranial metastatic disease.  Artifact mid brain on diffusion sequence without discrete acute infarct noted.  No intracranial hemorrhage.  Minimal nonspecific white matter type changes.  Mild global atrophy without hydrocephalus.  Major intracranial vascular structures are patent.  Polypoid opacification right frontal sinus with mild mucosal thickening ethmoid sinus air cells.  8 mm polypoid lesion in the left posterior-superior nasopharynx. Although this may represent a Thornwaldt cyst, mucosal abnormality not excluded.  No intracranial enhancing lesion or bony destructive lesion to suggest presence of intracranial metastatic disease.  No acute infarct noted.  8 mm polypoid lesion in the left posterior-superior nasopharynx. Although this may represent a Thornwaldt cyst, mucosal abnormality not excluded.  Please see above. Original Report Authenticated By: Fuller Canada, M.D.    Echo Study Conclusions  - Left ventricle: The cavity size was normal. Wall thickness was normal. Wall motion was normal; there were no regional wall motion abnormalities. Doppler parameters are consistent with abnormal left ventricular relaxation (grade 1 diastolic dysfunction). - Mitral valve: Moderately calcified annulus involving predominantly the posterior annulus. Mildly thickened leaflets . - Right ventricle: Systolic pressure was  increased. - Atrial septum: No defect or patent foramen ovale was identified. - Pulmonary arteries: PA peak pressure: 36mm Hg (S). Impressions:  - The right ventricular systolic pressure was increased consistent with mild pulmonary hypertension.     Today   Subjective:   [name] today has no headache,no chest abdominal pain,no new weakness tingling or numbness, feels much better wants to go home today.    Objective:   Blood pressure 135/80, pulse 66, temperature 98.2 F (36.8 C), temperature source Oral, resp. rate 22, height 5\' 6"  (1.676 m), weight 71.5 kg (157 lb 10.1 oz), SpO2 97.00%.  Intake/Output Summary (Last 24 hours) at 05/30/12 1516 Last data filed at 05/29/12 1841  Gross per 24 hour  Intake   1200 ml  Output      0 ml  Net   1200 ml    Exam Awake Alert, Oriented *3, No new F.N deficits, Normal affect [place].AT,PERRAL Supple Neck,No JVD, No cervical lymphadenopathy appriciated.  Symmetrical Chest wall movement, Good air movement bilaterally, CTAB RRR,No Gallops,Rubs or new Murmurs, No Parasternal Heave +ve B.Sounds, Abd Soft, Non tender, No organomegaly appriciated, No rebound -guarding or rigidity. No Cyanosis, Clubbing or edema, No new Rash or bruise  Data Review   Lab Results  Component Value Date   INR 1.13 05/30/2012   INR 1.27 05/29/2012   INR 3.58* 05/28/2012     CBC w Diff: Lab Results  Component Value Date   WBC 5.7 05/30/2012   WBC 6.7 09/21/2008  HGB 12.3 05/30/2012   HGB 15.1 09/21/2008   HCT 35.9* 05/30/2012   HCT 43.1 09/21/2008   PLT 237 05/30/2012   PLT 284 09/21/2008   LYMPHOPCT 22 05/26/2012   LYMPHOPCT 23.6 09/21/2008   MONOPCT 7 05/26/2012   MONOPCT 5.3 09/21/2008   EOSPCT 1 05/26/2012   EOSPCT 1.3 09/21/2008   BASOPCT 0 05/26/2012   BASOPCT 0.3 09/21/2008    CMP: Lab Results  Component Value Date   NA 142 05/29/2012   K 3.9 05/30/2012   CL 107 05/29/2012   CO2 24 05/29/2012   BUN 9 05/29/2012   CREATININE 0.76 05/29/2012   PROT 6.4  05/27/2012   ALBUMIN 3.6 05/27/2012   BILITOT 0.3 05/27/2012   ALKPHOS 41 05/27/2012   AST 14 05/27/2012   ALT 11 05/27/2012  .   Discharge Instructions     Follow with Primary MD Gershon Crane A, MD in 3 days, Keep herself well-hydrated and stay active and mobile.    Get CBC, CMP, INR, checked 3 days by Primary MD and again as instructed by your Primary MD. Get a 2 view Chest X ray done next visit.  Get Medicines reviewed and adjusted.  Please request your Prim.MD to go over all Hospital Tests and Procedure/Radiological results at the follow up, please get all Hospital records sent to your Prim MD by signing hospital release before you go home.  Activity: As tolerated with Full fall precautions use walker/cane & assistance as needed  Diet: Heart Healthy  Check your Weight same time everyday, if you gain over 2 pounds, or you develop in leg swelling, experience more shortness of breath or chest pain, call your Primary MD immediately. Follow Cardiac Low Salt Diet and 1.8 lit/[name] fluid restriction.  Disposition Home   If you experience worsening of your admission symptoms, develop shortness of breath, life threatening emergency, suicidal or homicidal thoughts you must seek medical attention immediately by calling 911 or calling your MD immediately  if symptoms less severe.  You Must read complete instructions/literature along with all the possible adverse reactions/side effects for all the Medicines you take and that have been prescribed to you. Take any new Medicines after you have completely understood and accpet all the possible adverse reactions/side effects.   Do not drive if your were admitted for syncope or siezures until you have seen by Primary MD or a Neurologist and advised to drive.  Do not drive when taking Pain medications.    Do not take more than prescribed Pain, Sleep and Anxiety Medications  Special Instructions: If you have smoked or chewed Tobacco  in the last 2 yrs please  stop smoking, stop any regular Alcohol  and or any Recreational drug use.  Wear Seat belts while driving.  Follow-up Information    Follow up with FRY,STEPHEN A, MD. Schedule an appointment as soon as possible for a visit in 3 days.   Contact information:   [address] [place] [address] [phone]       Follow up with Thornton Papas, MD. Schedule an appointment as soon as possible for a visit in 1 week.   Contact information:   [address] [place] [address] [phone]       Follow up with Shan Levans, MD. Schedule an appointment as soon as possible for a visit in 1 week.   Contact information:   [address] [facility] [address] [place] [address] [place] [address] [phone]  Follow up with Gates Rigg, MD. Schedule an appointment as soon as possible for a visit in 2 weeks.   Contact information:   [address] [facility] [place] [address] [phone]       Follow up with Dillard Cannon, MD. Schedule an appointment as soon as possible for a visit in 2 weeks.   Contact information:   Individual [address] [place] [address] [phone]          Discharge Medications    [name], [name]  Home Medication Instructions [id number]   Printed on:05/30/12 1503  Medication Information                    calcium carbonate (OS-CAL) 600 MG TABS Take 600 mg by mouth daily. + 600 mg Vit D + 400 mg of Magnesium            Potassium 99 MG TABS Take 1 tablet by mouth daily.             CYANOCOBALAMIN IJ Inject 1 application as directed every 30 (thirty) days. Around the 22nd or 23rd of each month           warfarin (COUMADIN) 5 MG tablet Take 7.5-10 mg by mouth daily. 10 mg on Monday and Friday, 7.5 mg all other days           acetaminophen (TYLENOL) 500 MG tablet Take 500 mg by mouth  every 6 (six) hours as needed. For pain           simvastatin (ZOCOR) 20 MG tablet TAKE 1 TABLET BY MOUTH EVERY [name]           hydrochlorothiazide (HYDRODIURIL) 25 MG tablet Take 25 mg by mouth daily.              Total Time in preparing paper work, data evaluation and todays exam - 35 minutes  Leroy Sea M.D on 05/30/2012 at 3:16 PM  [facility] Office  [phone]

## 2012-05-30 NOTE — Progress Notes (Signed)
Pt and family provided with d/c instructions and d/c. Pt is aware that she may not have anything to eat or drink until 430pm. Pt verbalizes udnerstanding. Pt aware of all follow appointments and verbalizes understanding. Pt educated on all medications and able to teachback importance of meds/what they are for. Pt and family deny any questions at this time. IV removed with tip intact. Heart monitor returend to front. Pt leaving with family for home. Ramond Craver, RN

## 2012-05-30 NOTE — Progress Notes (Signed)
ANTICOAGULATION CONSULT NOTE - Follow Up  Pharmacy Consult for Warfarin and Heparin Indication: DVT  Labs:   Basename 05/30/12 0510 05/29/12 1522 05/29/12 0536 05/28/12 0500  HGB 12.3 -- -- --  HCT 35.9* -- -- --  PLT 237 -- -- --  APTT 97* -- -- --  LABPROT 14.7 -- 16.2* 36.3*  INR 1.13 -- 1.27 3.58*  HEPARINUNFRC 0.71* 0.52 -- --  CREATININE -- -- 0.76 0.74  CKTOTAL -- -- -- --  CKMB -- -- -- --  TROPONINI -- -- -- --    Estimated Creatinine Clearance: 69.2 ml/min (by C-G formula based on Cr of 0.76).  Assessment: 66 yof on chronic coumadin for history of DVT. INR today 1.13 and  is  subtherapeutic.Marland Kitchen Patient is planned for needle biopsy to evaluate right lung mass.  Heparin level is within the therapeutic range but up some this morning.  (0.52 >> 0.71).  No bleeding complications noted however, there has been a drop in hemoglobin since 05/27/2012.  Platelets are unchanged and vitals are stable.  Home Regimen:   Warfarin (5mg  tabs) - She takes 10 mg on M/F and 7.5mg  on all other days.  Goal of Therapy:  Heparin level 0.3-0.7 units/ml INR 2-3 Monitor platelets by anticoagulation protocol: Yes   Plan:   Hold Warfarin until after biopsy, scheduled 7/5  Change Heparin rate to 900 units/hr.  Monitor for s/s of bleeding complications  F/U ongoing anticoagulation needs after Biopsy   Allergies  Allergen Reactions  . Codeine Other (See Comments)    Extreme sleepiness  . Morphine Other (See Comments)    Extreme sleepiness   Vital Signs: Temp: 98 F (36.7 C) (07/05 0400) Temp src: Oral (07/05 0400) BP: 109/67 mmHg (07/05 0400) Pulse Rate: 65  (07/05 0400)  Estimated Creatinine Clearance: 69.2 ml/min (by C-G formula based on Cr of 0.76).  Medical History: Past Medical History  Diagnosis Date  . DVT (deep venous thrombosis)     had 2 in right calf & 1 in the left calf  . Cataract   . Clotting disorder   . Anemia   . Hypertension   . Peripheral vascular disease    . Fibromyalgia    Nadara Mustard, PharmD., MS Clinical Pharmacist Pager:  [phone]  Thank you for allowing pharmacy to be part of this patients care team.

## 2012-05-30 NOTE — Progress Notes (Signed)
This note also relates to the following rows which could not be included: Temp - Cannot attach notes to unvalidated device data ECG Heart Rate - Cannot attach notes to unvalidated device data Resp - Cannot attach notes to unvalidated device data BP - Cannot attach notes to unvalidated device data SpO2 - Cannot attach notes to unvalidated device data Changed to RA this time

## 2012-05-30 NOTE — Telephone Encounter (Signed)
S/w husband re appt for 7/10 @ 10:30 am w/BS.

## 2012-06-02 ENCOUNTER — Other Ambulatory Visit (HOSPITAL_COMMUNITY)
Admission: RE | Admit: 2012-06-02 | Discharge: 2012-06-02 | Disposition: A | Discharge status: Home / Self Care | Payer: Medicare Other | Source: Ambulatory Visit | Attending: Critical Care Medicine | Admitting: Critical Care Medicine

## 2012-06-02 ENCOUNTER — Ambulatory Visit (INDEPENDENT_AMBULATORY_CARE_PROVIDER_SITE_OTHER): Payer: Medicare Other | Admitting: Family

## 2012-06-02 ENCOUNTER — Telehealth: Payer: Self-pay | Admitting: Critical Care Medicine

## 2012-06-02 ENCOUNTER — Other Ambulatory Visit: Payer: Self-pay | Admitting: Family

## 2012-06-02 ENCOUNTER — Other Ambulatory Visit: Payer: Self-pay | Admitting: Critical Care Medicine

## 2012-06-02 DIAGNOSIS — I82409 Acute embolism and thrombosis of unspecified deep veins of unspecified lower extremity: Secondary | ICD-10-CM

## 2012-06-02 DIAGNOSIS — J984 Other disorders of lung: Secondary | ICD-10-CM | POA: Insufficient documentation

## 2012-06-02 DIAGNOSIS — R918 Other nonspecific abnormal finding of lung field: Secondary | ICD-10-CM

## 2012-06-02 LAB — BASIC METABOLIC PANEL
CO2: 28 mEq/L (ref 19–32)
Chloride: 102 mEq/L (ref 96–112)
Creatinine, Ser: 0.9 mg/dL (ref 0.4–1.2)
Potassium: 3.3 mEq/L — ABNORMAL LOW (ref 3.5–5.1)

## 2012-06-02 LAB — CULTURE, RESPIRATORY W GRAM STAIN

## 2012-06-02 LAB — POCT INR: INR: 1.3

## 2012-06-02 MED ORDER — WARFARIN SODIUM 5 MG PO TABS: 7.5000 mg | ORAL_TABLET | Freq: Every day | ORAL | Status: DC

## 2012-06-02 MED ORDER — POTASSIUM CHLORIDE CRYS ER 10 MEQ PO TBCR: 10.0000 meq | EXTENDED_RELEASE_TABLET | Freq: Every day | ORAL | Status: DC

## 2012-06-02 NOTE — Telephone Encounter (Signed)
I spoke with Misty Stanley and is aware of this. Will forward back to PW and as a reminder

## 2012-06-02 NOTE — Telephone Encounter (Signed)
I spoke with lisa and she is requesting pt bronchoscopy results. Please advise Dr. Delford Field thanks

## 2012-06-02 NOTE — Patient Instructions (Addendum)
10mg  Tuesday and Wednesday only this week.  Then On Mondays and Fridays, 10mg  (2 tabs).   All other days take 7.5mg  (1.5 tabs). Check in 4 weeks    Latest dosing instructions   Total Sun Mon Tue Wed Thu Fri Sat   57.5 7.5 mg 10 mg 7.5 mg 7.5 mg 7.5 mg 10 mg 7.5 mg    (5 mg1.5) (5 mg2) (5 mg1.5) (5 mg1.5) (5 mg1.5) (5 mg2) (5 mg1.5)

## 2012-06-02 NOTE — Telephone Encounter (Signed)
Tell them it is being read,  Will call with report this PM

## 2012-06-03 ENCOUNTER — Encounter (HOSPITAL_COMMUNITY): Payer: Self-pay | Admitting: Critical Care Medicine

## 2012-06-03 NOTE — Telephone Encounter (Signed)
Pt is aware of results. 

## 2012-06-03 NOTE — Telephone Encounter (Signed)
Patient's daughter calling upset about not hearing results yesterday as promised by nurse.  Please return call.  [phone]

## 2012-06-03 NOTE — Telephone Encounter (Signed)
I called and spoke to the Daughter Misty Stanley and she verbalized understanding that the biopsy was Cancer of Lung.

## 2012-06-04 ENCOUNTER — Encounter: Payer: Self-pay | Admitting: Oncology

## 2012-06-04 ENCOUNTER — Ambulatory Visit (HOSPITAL_BASED_OUTPATIENT_CLINIC_OR_DEPARTMENT_OTHER): Payer: Medicare Other | Admitting: Oncology

## 2012-06-04 ENCOUNTER — Ambulatory Visit: Payer: Medicare Other

## 2012-06-04 VITALS — BP 120/80 | HR 78 | Temp 97.1°F | Ht 64.5 in | Wt 143.5 lb

## 2012-06-04 DIAGNOSIS — C3490 Malignant neoplasm of unspecified part of unspecified bronchus or lung: Secondary | ICD-10-CM

## 2012-06-04 NOTE — Progress Notes (Signed)
[facility] New Patient Consult   Referring MD: Hali Balgobin 66 y.o.  1946-03-15    Reason for Referral: New diagnosis of non-small cell lung cancer     HPI: She reports diplopia for 10-15 minutes on 05/24/2012. This occurred again later the same day. On 05/25/2012 she felt "pressure "in the eyes and noted a droop of the right eyelid. On 05/26/2012 she had difficulty "focusing "her vision and [facility]. She was admitted for further evaluation. A brain CT was negative. An MRI of the brain revealed no intracranial enhancing lesion or bony destructive lesion. No acute infarct. A CT of the neck was remarkable for right paratracheal adenopathy and a suspicious left supraclavicular node. A CT of the chest on 05/27/2012 revealed a tumor at the confluence of the right upper, middle, and lower lobes measuring 3.6 x 2.7 x 1.8 cm. A metastatic lymph node was noted at the right hilum and a 4 mm metastatic nodule seen in the right upper lobe. Mediastinal adenopathy was seen with azygous and precarinal adenopathy. A 1 x 1.2 cm node was seen anterior to the superior vena cava. No effusions.  Dr. Delford Field was consulted and she was taken to a bronchoscopy procedure on 05/30/2012. An endobronchial lesion was noted at the right middle lobe lateral segment. A biopsy confirmed squamous cell carcinoma.  She had no further diplopia while in the hospital. She reports an episode of diplopia when she returned home on 05/30/2012. Her only other complaint is fatigue.   Past Medical History  Diagnosis Date  . DVT (deep venous thrombosis)     had 2 in right calf & 1 in the left calf  . Cataract   .  hyperlipidemia    . Anemia   . Hypertension   . Peripheral vascular disease   . Fibromyalgia    .  G9, P2, twins  .  Intermittent vertigo  Past Surgical History  Procedure Date  . Appendectomy   . Cholecystectomy   . Tonsillectomy     . Knee arthroscopy     right  . Laminectomy     lumbar  . Colonoscopy     11/28/09 repeat in 10 years  . Back surgery   . Video bronchoscopy 05/30/2012    Procedure: VIDEO BRONCHOSCOPY WITH FLUORO;  Surgeon: Storm Frisk, MD;  Location: [facility] ENDOSCOPY;  Service: Cardiopulmonary;  Laterality: N/A;   .     bilateral cataract surgery in 2011   Family History  Problem Relation Age of Onset  . Anemia      FE deficiency   . Arthritis      family history  . Cancer      colon 1st degree relative <60  . Hypertension      family history  . Multiple sclerosis Sister     Current outpatient prescriptions:acetaminophen (TYLENOL) 500 MG tablet, Take 500 mg by mouth every 6 (six) hours as needed. For pain, Disp: , Rfl: ;  calcium carbonate (OS-CAL) 600 MG TABS, Take 600 mg by mouth daily. + 600 mg Vit D + 400 mg of Magnesium , Disp: , Rfl: ;  CYANOCOBALAMIN IJ, Inject 1 application as directed every 30 (thirty) days. Around the 22nd or 23rd of each month, Disp: , Rfl:  hydrochlorothiazide (HYDRODIURIL) 25 MG tablet, Take 25 mg by mouth daily., Disp: , Rfl: ;  potassium chloride SA (K-DUR,KLOR-CON) 10 MEQ tablet, Take 1 tablet (10  mEq total) by mouth daily., Disp: 30 tablet, Rfl: 1;  simvastatin (ZOCOR) 20 MG tablet, TAKE 1 TABLET BY MOUTH EVERY DAY, Disp: 30 tablet, Rfl: 11 warfarin (COUMADIN) 5 MG tablet, Take 1.5-2 tablets (7.5-10 mg total) by mouth daily. 10 mg on Monday and Friday, 7.5 mg all other days, Disp: 60 tablet, Rfl: 2  Allergies:  Allergies  Allergen Reactions  . Codeine Other (See Comments)    Extreme sleepiness  . Morphine Other (See Comments)    Extreme sleepiness    Social History: She lives with her husband and [place]. She has worked as a Social worker. She has smoked cigarettes for many years, currently one pack every 3-4 days, no alcohol use, no transfusion history, a risk factor for HIV or hepatitis.    ROS:   Positives include: intentional weight loss-10 pounds, "burps  "after eating, fullness in the right lower neck, numbness in an area of the right low lateral thigh since October of 2012, chronic intermittent episodes of vertigo, episodes of diplopia and difficulty focusing her vision as per the history of present illness  A complete ROS was otherwise negative.  Physical Exam:  Blood pressure 120/80, pulse 78, temperature 97.1 F (36.2 C), temperature source Oral, height 5' 4.5" (1.638 m), weight 143 lb 8 oz (65.091 kg).  HEENT: Oropharynx without visible mass, neck without mass  Lungs: Clear bilaterally  Cardiac: Regular rate and rhythm  Abdomen: No hepatosplenomegaly, nontender  Vascular: No leg edema  Lymph nodes: No cervical, supraclavicular, axillary, or inguinal nodes  Neurologic: Alert and oriented, the motor exam appears intact in the upper and lower extremities, the tendon reflexes are 2+ at the knees and triceps bilaterally. The face is symmetric. The extraocular movements are intact.  Skin: No rash   LAB:  CBC  Lab Results  Component Value Date   WBC 5.7 05/30/2012   HGB 12.3 05/30/2012   HCT 35.9* 05/30/2012   MCV 86.7 05/30/2012   PLT 237 05/30/2012     05/26/2012-hemolymph 15.7, ANC 4.6  CMP      Component Value Date/Time   NA 142 06/02/2012 1245   K 3.3* 06/02/2012 1245   CL 102 06/02/2012 1245   CO2 28 06/02/2012 1245   GLUCOSE 102* 06/02/2012 1245   BUN 14 06/02/2012 1245   CREATININE 0.9 06/02/2012 1245   CALCIUM 10.0 06/02/2012 1245   PROT 6.4 05/27/2012 0520   ALBUMIN 3.6 05/27/2012 0520   AST 14 05/27/2012 0520   ALT 11 05/27/2012 0520   ALKPHOS 41 05/27/2012 0520   BILITOT 0.3 05/27/2012 0520   GFRNONAA 85* 05/29/2012 0536   GFRAA >90 05/29/2012 0536     Radiology:I reviewed the CT of the chest from 05/27/2012 with the patient and her family. There is a right lung mass, hilar lymphadenopathy, and mediastinal lymphadenopathy. There is a tiny nodule in the right upper lung. Small lymph nodes in the left clavicular region    Assessment/Plan:    1. Non-small cell lung cancer, squamous cell carcinoma, involving a right lung mass and mediastinal lymph nodes  2. Diplopia-several episodes over the past 2 weeks, negative MRI of the brain   Disposition:   She appears to have clinical stage III non-small cell lung cancer. I discussed the diagnosis and standard treatment plan with the patient and her family. We discussed radiation and concurrent weekly chemotherapy. I explained there is generally no role for surgery in patients with stage IIIB or higher non-small cell lung cancer.  She should complete  a staging PET scan evaluation to further evaluate the mediastinum, supraclavicular area, and look for evidence of distant metastatic disease.  The etiology of the diplopia remains unclear. I have a low clinical suspicion for carcinomatous meningitis given the infrequent episodes of diplopia. However I recommend a diagnostic lumbar puncture.  Ms. Rickles has decided to obtain her oncology care at [facility]. She has arranged for an appointment at [facility] within the next one week. She plans to obtain her imaging studies on a disc to take with her. I did not order the staging PET scan or lumbar puncture today. I will defer these studies to the [facility] physicians.  I told Ms. Mcconathy and her family that I will be glad to participate in her care in the future.  She has not been scheduled for a followup appointment at [facility]. She will contact us as needed.   Approximately 60 minutes were spent with the patient and her family today. Greater than 50% of the time was spent in counseling the patient regarding her diagnosis and treatment options.   Kaden Dunkel 06/04/2012, 8:31 PM

## 2012-06-04 NOTE — Progress Notes (Signed)
Patient came in this morning as a new patient with her husband and daughter and she has one insurance blue medicare.I did explain to her our financial assistance and co-pay assistance program we offer here,she said for right now she is oh kay.I did give her Alcario Drought card just in case she has any question to call her.

## 2012-06-05 LAB — MISCELLANEOUS TEST

## 2012-06-12 ENCOUNTER — Encounter: Payer: Medicare Other | Admitting: Family

## 2012-06-16 ENCOUNTER — Encounter: Payer: Medicare Other | Admitting: Family

## 2012-06-16 ENCOUNTER — Telehealth: Payer: Self-pay | Admitting: Family

## 2012-06-16 NOTE — Telephone Encounter (Signed)
Patient needs to have INR checked since it was abnormal last OV. Please schedule.

## 2012-06-16 NOTE — Telephone Encounter (Signed)
Called and schd inr on 06/19/12 as noted.

## 2012-06-19 ENCOUNTER — Ambulatory Visit (INDEPENDENT_AMBULATORY_CARE_PROVIDER_SITE_OTHER): Payer: Medicare Other | Admitting: Family

## 2012-06-19 DIAGNOSIS — I82409 Acute embolism and thrombosis of unspecified deep veins of unspecified lower extremity: Secondary | ICD-10-CM

## 2012-06-19 DIAGNOSIS — E876 Hypokalemia: Secondary | ICD-10-CM

## 2012-06-19 LAB — BASIC METABOLIC PANEL
BUN: 16 mg/dL (ref 6–23)
Chloride: 101 mEq/L (ref 96–112)
GFR: 62.3 mL/min (ref >60.00)
Potassium: 4.6 mEq/L (ref 3.5–5.1)
Sodium: 138 mEq/L (ref 135–145)

## 2012-06-19 NOTE — Patient Instructions (Addendum)
Hold tomorrow dose of Coumadin.  Increase intake of green veggies over the next 2-3 days. Then continue Mondays and Fridays, 10mg  (2 tabs).   All other days take 7.5mg  (1.5 tabs).  Check in 2weeks    Latest dosing instructions   Total Sun Mon Tue Wed Thu Fri Sat   57.5 7.5 mg 10 mg 7.5 mg 7.5 mg 7.5 mg 10 mg 7.5 mg    (5 mg1.5) (5 mg2) (5 mg1.5) (5 mg1.5) (5 mg1.5) (5 mg2) (5 mg1.5)

## 2012-06-30 LAB — FUNGUS CULTURE W SMEAR

## 2012-07-01 ENCOUNTER — Encounter: Payer: Medicare Other | Admitting: Family

## 2012-07-02 ENCOUNTER — Encounter: Payer: Medicare Other | Admitting: Family

## 2012-07-03 ENCOUNTER — Encounter: Payer: Medicare Other | Admitting: Family

## 2012-07-16 ENCOUNTER — Ambulatory Visit (INDEPENDENT_AMBULATORY_CARE_PROVIDER_SITE_OTHER): Payer: Medicare Other

## 2012-07-16 ENCOUNTER — Ambulatory Visit (INDEPENDENT_AMBULATORY_CARE_PROVIDER_SITE_OTHER): Payer: Medicare Other | Admitting: Family Medicine

## 2012-07-16 DIAGNOSIS — E539 Vitamin B deficiency, unspecified: Secondary | ICD-10-CM

## 2012-07-16 DIAGNOSIS — I82409 Acute embolism and thrombosis of unspecified deep veins of unspecified lower extremity: Secondary | ICD-10-CM

## 2012-07-16 LAB — POCT INR: INR: 3.3

## 2012-07-16 LAB — PROTIME-INR: INR: 3.3 — AB (ref 0.9–1.1)

## 2012-07-16 MED ORDER — CYANOCOBALAMIN 1000 MCG/ML IJ SOLN
1000.0000 ug | Freq: Once | INTRAMUSCULAR | Status: AC
  Administered 2012-07-16: 1000 ug via INTRAMUSCULAR

## 2012-07-16 NOTE — Patient Instructions (Signed)
  Latest dosing instructions   Total Sun Mon Tue Wed Thu Fri Sat   57.5 7.5 mg 10 mg 7.5 mg 7.5 mg 7.5 mg 10 mg 7.5 mg    (5 mg1.5) (5 mg2) (5 mg1.5) (5 mg1.5) (5 mg1.5) (5 mg2) (5 mg1.5)        

## 2012-07-21 ENCOUNTER — Ambulatory Visit (INDEPENDENT_AMBULATORY_CARE_PROVIDER_SITE_OTHER): Payer: Medicare Other | Admitting: Family

## 2012-07-21 ENCOUNTER — Telehealth: Payer: Self-pay | Admitting: Family

## 2012-07-21 DIAGNOSIS — I82409 Acute embolism and thrombosis of unspecified deep veins of unspecified lower extremity: Secondary | ICD-10-CM

## 2012-07-21 NOTE — Telephone Encounter (Signed)
Left a message for pt to return call 

## 2012-07-21 NOTE — Telephone Encounter (Signed)
Please advise patient to. Decrease Coumadin to 1.5 tabs a day(7.5mg ) everyday except 10mg  on Wednesday only.

## 2012-07-21 NOTE — Patient Instructions (Signed)
Hold Coumadin today. Then decrease Coumadin to 1.5 tabs a day(7.5mg ) and 10mg  on Wednesday only. Recheck in 3 weeks.

## 2012-07-21 NOTE — Telephone Encounter (Signed)
Called and spoke with pt and pt given coumadin dosing.  Pt states she understands.

## 2012-08-04 ENCOUNTER — Ambulatory Visit: Payer: Medicare Other | Admitting: Family Medicine

## 2012-08-04 ENCOUNTER — Encounter: Payer: Medicare Other | Admitting: Family

## 2012-08-08 ENCOUNTER — Ambulatory Visit: Payer: Medicare Other | Admitting: Family Medicine

## 2012-08-12 ENCOUNTER — Telehealth: Payer: Self-pay | Admitting: Family

## 2012-08-12 NOTE — Telephone Encounter (Signed)
Called and lft pt a vm that she needs to sch Coumadin visit as noted.

## 2012-08-12 NOTE — Telephone Encounter (Signed)
Patient was suppose to recheck in 3 weeks. Please reschedule Coumadin visit.

## 2012-08-13 NOTE — Telephone Encounter (Signed)
Called and lft another vm for pt to sch coumadin visit as noted.

## 2012-08-15 NOTE — Telephone Encounter (Signed)
Called pt and she answered then we were disconnected. Attempted to call back and no answer

## 2012-08-20 ENCOUNTER — Ambulatory Visit: Payer: Medicare Other | Admitting: Family

## 2012-08-20 ENCOUNTER — Ambulatory Visit (INDEPENDENT_AMBULATORY_CARE_PROVIDER_SITE_OTHER): Payer: Medicare Other | Admitting: Family Medicine

## 2012-08-20 ENCOUNTER — Ambulatory Visit: Payer: Medicare Other | Admitting: Family Medicine

## 2012-08-20 ENCOUNTER — Encounter: Payer: Self-pay | Admitting: Family Medicine

## 2012-08-20 VITALS — BP 98/66 | HR 110 | Temp 97.9°F | Wt 140.0 lb

## 2012-08-20 DIAGNOSIS — I10 Essential (primary) hypertension: Secondary | ICD-10-CM

## 2012-08-20 DIAGNOSIS — I82409 Acute embolism and thrombosis of unspecified deep veins of unspecified lower extremity: Secondary | ICD-10-CM

## 2012-08-20 DIAGNOSIS — C3490 Malignant neoplasm of unspecified part of unspecified bronchus or lung: Secondary | ICD-10-CM

## 2012-08-20 DIAGNOSIS — E539 Vitamin B deficiency, unspecified: Secondary | ICD-10-CM

## 2012-08-20 LAB — POCT INR: INR: 2.2

## 2012-08-20 MED ORDER — SUCRALFATE 1 GM/10ML PO SUSP: 1.0000 g | Freq: Four times a day (QID) | ORAL | Status: DC

## 2012-08-20 MED ORDER — CYANOCOBALAMIN 1000 MCG/ML IJ SOLN
1000.0000 ug | Freq: Once | INTRAMUSCULAR | Status: AC
  Administered 2012-08-20: 1000 ug via INTRAMUSCULAR

## 2012-08-20 MED ORDER — DIAZEPAM 2 MG PO TABS: 2.0000 mg | ORAL_TABLET | Freq: Two times a day (BID) | ORAL | Status: DC | PRN

## 2012-08-20 NOTE — Patient Instructions (Addendum)
Stop Lovenox injections. Increase Coumadin to 7.5mg  on Tuesday and Thursday. Continue 5mg  all other days. Recheck in 2 weeks.  Fax INR results to [phone] Attn: Adline Mango

## 2012-08-20 NOTE — Progress Notes (Signed)
  Subjective:    Patient ID: [name], female    DOB: [birth year], 66 y.o.   MRN: [id number]  HPI Here with her husband to follow up a hospital stay at [facility] for squamous cell lung cancer. She was there from 08-11-12 to 08-13-12 for her final round of radiation therapy to the chest. They say that the tumor size has reduced by 90%. Now the plan is for her to begin chemotherapy in a few weeks. She has been struggling with throat pain and chest pain, and it is very difficult for her to get nutrition. She is constantly nauseated, and she cannot eat solid food at all. She can sometimes keep small amounts of liquid down, but often even a sip comes back up. She has tried Ensure and Boost with poor results. She cannot tolerate Reglan, and she had palpitations from Zofran. Phenergan and Compazine have not helped. She gets the best results with Dronabinol, and she takes this bid. No SOB. She is urinating normally and BMs are normal. She is on Omeprazole bid. Her INR is therapeutic today at 2.2, so her Lovenox has been stopped. She has a vena cava port in place for IV fluids and meds.    Review of Systems  Constitutional: Negative.   Respiratory: Negative.   Cardiovascular: Positive for chest pain. Negative for palpitations and leg swelling.  Gastrointestinal: Positive for abdominal pain. Negative for constipation, blood in stool and abdominal distention.       Objective:   Physical Exam  Constitutional: She appears well-developed and well-nourished.  Neck: Neck supple. No thyromegaly present.  Cardiovascular: Normal rate, regular rhythm, normal heart sounds and intact distal pulses.   Pulmonary/Chest: Effort normal and breath sounds normal.  Abdominal: Soft. Bowel sounds are normal. She exhibits no distension and no mass. There is no tenderness. There is no rebound and no guarding.  Lymphadenopathy:    She has no cervical adenopathy.          Assessment & Plan:  She is in the midst of  treatment for lung cancer, and she is struggling with nausea and with poor nutrition. We will add Carafate slurry QID to see if this can help. Also we will switch from Ativan to Valium to see if this can ease her symptoms a little. Try to use Ensure, etc as much as possible.

## 2012-08-26 HISTORY — PX: ESOPHAGOGASTRODUODENOSCOPY: SHX1529

## 2012-09-11 ENCOUNTER — Telehealth: Payer: Self-pay | Admitting: Family

## 2012-09-11 NOTE — Telephone Encounter (Signed)
Patient needs INR checked. She is overdue. If she has had it done, please get results faxed.

## 2012-09-11 NOTE — Telephone Encounter (Signed)
Per pt's daughter, pt is currently being admitted into [facility] cancer center and she will have pt schedule appt when she gets out

## 2012-09-16 ENCOUNTER — Telehealth: Payer: Self-pay | Admitting: Family

## 2012-09-16 ENCOUNTER — Ambulatory Visit (INDEPENDENT_AMBULATORY_CARE_PROVIDER_SITE_OTHER): Payer: Medicare Other | Admitting: Family

## 2012-09-16 DIAGNOSIS — I82409 Acute embolism and thrombosis of unspecified deep veins of unspecified lower extremity: Secondary | ICD-10-CM

## 2012-09-16 LAB — POCT INR: INR: 1.1

## 2012-09-16 NOTE — Telephone Encounter (Signed)
Results reviewed by NP already 1.1 faxed from [facility]

## 2012-09-16 NOTE — Patient Instructions (Addendum)
Take 10mg  of coumadin today and tomorrow. Then continue 5mg  a day EXCEPT 7.5mg  on Tues and Thurs.    Latest dosing instructions   Total Sun Mon Tue Wed Thu Fri Sat   40 5 mg 5 mg 7.5 mg 5 mg 7.5 mg 5 mg 5 mg    (5 mg1) (5 mg1) (5 mg1.5) (5 mg1) (5 mg1.5) (5 mg1) (5 mg1)       Results faxed from lab

## 2012-09-16 NOTE — Telephone Encounter (Signed)
Needs to have INR checked asap. 

## 2012-09-16 NOTE — Telephone Encounter (Signed)
Take 10mg  of coumadin today and tomorrow. Then continue 5mg  a day EXCEPT 7.5mg  on Tues and Thurs. Recheck in 10 days    Latest dosing instructions   Total Glynis Smiles Tue Wed Thu Fri Sat   40 5 mg 5 mg 7.5 mg 5 mg 7.5 mg 5 mg 5 mg    (5 mg1) (5 mg1) (5 mg1.5) (5 mg1) (5 mg1.5) (5 mg1) (5 mg1)

## 2012-09-16 NOTE — Telephone Encounter (Signed)
Spoke with pt's husband who states that pt was readmitted into the hospital on Thursday because her inr was 8. [facility] MD's have taken her off coumadin right now and she will see them again Friday to check her status

## 2012-10-02 ENCOUNTER — Encounter: Payer: Self-pay | Admitting: Family Medicine

## 2012-10-02 ENCOUNTER — Ambulatory Visit (INDEPENDENT_AMBULATORY_CARE_PROVIDER_SITE_OTHER): Payer: Medicare Other | Admitting: Family Medicine

## 2012-10-02 VITALS — BP 102/68 | HR 118 | Temp 98.6°F | Wt 134.0 lb

## 2012-10-02 DIAGNOSIS — C3490 Malignant neoplasm of unspecified part of unspecified bronchus or lung: Secondary | ICD-10-CM

## 2012-10-02 DIAGNOSIS — M545 Low back pain, unspecified: Secondary | ICD-10-CM

## 2012-10-02 DIAGNOSIS — E539 Vitamin B deficiency, unspecified: Secondary | ICD-10-CM

## 2012-10-02 DIAGNOSIS — G4700 Insomnia, unspecified: Secondary | ICD-10-CM

## 2012-10-02 DIAGNOSIS — I82409 Acute embolism and thrombosis of unspecified deep veins of unspecified lower extremity: Secondary | ICD-10-CM

## 2012-10-02 DIAGNOSIS — I10 Essential (primary) hypertension: Secondary | ICD-10-CM

## 2012-10-02 MED ORDER — TEMAZEPAM 15 MG PO CAPS: 15.0000 mg | ORAL_CAPSULE | Freq: Every evening | ORAL | Status: DC | PRN

## 2012-10-02 MED ORDER — OMEPRAZOLE 20 MG PO CPDR: 20.0000 mg | DELAYED_RELEASE_CAPSULE | Freq: Two times a day (BID) | ORAL | Status: DC

## 2012-10-02 MED ORDER — CYANOCOBALAMIN 1000 MCG/ML IJ SOLN
1000.0000 ug | Freq: Once | INTRAMUSCULAR | Status: AC
  Administered 2012-10-02: 1000 ug via INTRAMUSCULAR

## 2012-10-02 NOTE — Addendum Note (Signed)
Addended by: Aniceto Boss A on: 10/02/2012 12:47 PM   Modules accepted: Orders

## 2012-10-02 NOTE — Progress Notes (Signed)
  Subjective:    Patient ID: [name], female    DOB: 13-Oct-1946, 66 y.o.   MRN: [id number]  HPI Here to follow up after another admission at [facility] from 09-12-12 to 09-15-12 for chest pain and nausea. Not surprisingly an upper endoscopy revealed severe esophagitis from her radiation treatments. They told her to take Omeprazole bid and Sucralfate 1 gm 4 times a [name]. Because of the risk of bleeding, her Coumadin was stopped. Since getting home she has slowly improved. It is still difficult for her to eat but she is trying to get as much nutrition in as she can. The chest pain is much better, and she rarely uses any Oxycodone. She does have trouble sleeping however, and Ativan does not help this.    Review of Systems  Constitutional: Negative.   Respiratory: Negative.   Cardiovascular: Positive for chest pain. Negative for palpitations and leg swelling.  Gastrointestinal: Positive for nausea. Negative for vomiting, abdominal pain, diarrhea, constipation, blood in stool, abdominal distention and anal bleeding.       Objective:   Physical Exam  Constitutional: She appears well-developed and well-nourished.  Neck: No thyromegaly present.  Cardiovascular: Normal rate, regular rhythm, normal heart sounds and intact distal pulses.   Pulmonary/Chest: Effort normal and breath sounds normal.  Lymphadenopathy:    She has no cervical adenopathy.          Assessment & Plan:  She seems to be doing well for now. She is through with all radiation and all chemotherapy. She will get PET scans every 3 months for awhile to see if her cancer stays in remission. She will maximize her nutrition as much as possible. We will stay off all blood thinners. I suggested she use ES Tylenol for pain and to stop the oxycodone. Try temazepam for sleep.

## 2012-10-09 ENCOUNTER — Ambulatory Visit: Payer: Self-pay | Admitting: Family

## 2012-10-09 DIAGNOSIS — I82409 Acute embolism and thrombosis of unspecified deep veins of unspecified lower extremity: Secondary | ICD-10-CM

## 2012-10-14 ENCOUNTER — Other Ambulatory Visit: Payer: Self-pay | Admitting: Family Medicine

## 2012-10-14 NOTE — Telephone Encounter (Signed)
Cancel the order for 15 mg and cal in 30 mg qhs, #30 with 5 rf

## 2012-10-14 NOTE — Telephone Encounter (Signed)
Pt needs script for TEMAZEPAM, 30mg . (Instead of 15mg )

## 2012-10-15 MED ORDER — TEMAZEPAM 30 MG PO CAPS: 30.0000 mg | ORAL_CAPSULE | Freq: Every evening | ORAL | Status: DC | PRN

## 2012-10-15 NOTE — Telephone Encounter (Signed)
I called in script and spoke with pt. 

## 2012-11-18 ENCOUNTER — Encounter (HOSPITAL_COMMUNITY): Payer: Self-pay

## 2012-11-18 ENCOUNTER — Emergency Department (HOSPITAL_COMMUNITY)
Admission: EM | Admit: 2012-11-18 | Discharge: 2012-11-18 | Disposition: A | Discharge status: Home / Self Care | Payer: Medicare Other | Attending: Emergency Medicine | Admitting: Emergency Medicine

## 2012-11-18 DIAGNOSIS — I10 Essential (primary) hypertension: Secondary | ICD-10-CM | POA: Insufficient documentation

## 2012-11-18 DIAGNOSIS — Z85118 Personal history of other malignant neoplasm of bronchus and lung: Secondary | ICD-10-CM | POA: Insufficient documentation

## 2012-11-18 DIAGNOSIS — Z86718 Personal history of other venous thrombosis and embolism: Secondary | ICD-10-CM | POA: Insufficient documentation

## 2012-11-18 DIAGNOSIS — Z79899 Other long term (current) drug therapy: Secondary | ICD-10-CM | POA: Insufficient documentation

## 2012-11-18 DIAGNOSIS — L02219 Cutaneous abscess of trunk, unspecified: Secondary | ICD-10-CM | POA: Insufficient documentation

## 2012-11-18 DIAGNOSIS — Y849 Medical procedure, unspecified as the cause of abnormal reaction of the patient, or of later complication, without mention of misadventure at the time of the procedure: Secondary | ICD-10-CM | POA: Insufficient documentation

## 2012-11-18 DIAGNOSIS — Z87891 Personal history of nicotine dependence: Secondary | ICD-10-CM | POA: Insufficient documentation

## 2012-11-18 DIAGNOSIS — IMO0001 Reserved for inherently not codable concepts without codable children: Secondary | ICD-10-CM | POA: Insufficient documentation

## 2012-11-18 DIAGNOSIS — L0390 Cellulitis, unspecified: Secondary | ICD-10-CM

## 2012-11-18 DIAGNOSIS — T80212A Local infection due to central venous catheter, initial encounter: Secondary | ICD-10-CM | POA: Insufficient documentation

## 2012-11-18 DIAGNOSIS — Z862 Personal history of diseases of the blood and blood-forming organs and certain disorders involving the immune mechanism: Secondary | ICD-10-CM | POA: Insufficient documentation

## 2012-11-18 DIAGNOSIS — Z8679 Personal history of other diseases of the circulatory system: Secondary | ICD-10-CM | POA: Insufficient documentation

## 2012-11-18 DIAGNOSIS — Z8669 Personal history of other diseases of the nervous system and sense organs: Secondary | ICD-10-CM | POA: Insufficient documentation

## 2012-11-18 LAB — CBC WITH DIFFERENTIAL/PLATELET
Eosinophils Absolute: 0 10*3/uL (ref 0.0–0.7)
Eosinophils Relative: 1 % (ref 0–5)
HCT: 38.6 % (ref 36.0–46.0)
Lymphs Abs: 0.7 10*3/uL (ref 0.7–4.0)
MCHC: 34.5 g/dL (ref 30.0–36.0)
Monocytes Absolute: 0.3 10*3/uL (ref 0.1–1.0)
Monocytes Relative: 7 % (ref 3–12)
WBC: 3.9 10*3/uL — ABNORMAL LOW (ref 4.0–10.5)

## 2012-11-18 LAB — BASIC METABOLIC PANEL
BUN: 10 mg/dL (ref 6–23)
Calcium: 10.3 mg/dL (ref 8.4–10.5)
Creatinine, Ser: 0.78 mg/dL (ref 0.50–1.10)
GFR calc non Af Amer: 85 mL/min — ABNORMAL LOW (ref >90)
Glucose, Bld: 84 mg/dL (ref 70–99)
Potassium: 3.7 mEq/L (ref 3.5–5.1)

## 2012-11-18 MED ORDER — VANCOMYCIN HCL IN DEXTROSE 1-5 GM/200ML-% IV SOLN
1000.0000 mg | Freq: Once | INTRAVENOUS | Status: AC
  Administered 2012-11-18: 1000 mg via INTRAVENOUS
  Filled 2012-11-18: qty 200

## 2012-11-18 MED ORDER — VANCOMYCIN HCL 1000 MG IV SOLR
750.0000 mg | Freq: Two times a day (BID) | INTRAVENOUS | Status: DC
  Filled 2012-11-18: qty 750

## 2012-11-18 MED ORDER — DOXYCYCLINE HYCLATE 100 MG PO TABS: 100.0000 mg | ORAL_TABLET | Freq: Two times a day (BID) | ORAL | Status: DC

## 2012-11-18 MED ORDER — DIPHENHYDRAMINE HCL 50 MG/ML IJ SOLN
25.0000 mg | Freq: Once | INTRAMUSCULAR | Status: AC
  Administered 2012-11-18: 25 mg via INTRAVENOUS
  Filled 2012-11-18: qty 1

## 2012-11-18 NOTE — Progress Notes (Signed)
ANTIBIOTIC CONSULT NOTE - INITIAL  Pharmacy Consult for Vancomycin Indication: Port-a-cath infection  Allergies  Allergen Reactions  . Codeine Other (See Comments)    Extreme sleepiness  . Morphine Other (See Comments)    Extreme sleepiness    Patient Measurements:   Vital Signs: Temp: 98.4 F (36.9 C) (12/24 1734) Temp src: Oral (12/24 1734) BP: 124/81 mmHg (12/24 1734) Pulse Rate: 98  (12/24 1734) Intake/Output from previous day:   Intake/Output from this shift:    Labs: No results found for this basename: WBC:3,HGB:3,PLT:3,LABCREA:3,CREATININE:3 in the last 72 hours The CrCl is unknown because both a height and weight (above a minimum accepted value) are required for this calculation. No results found for this basename: VANCOTROUGH:2,VANCOPEAK:2,VANCORANDOM:2,GENTTROUGH:2,GENTPEAK:2,GENTRANDOM:2,TOBRATROUGH:2,TOBRAPEAK:2,TOBRARND:2,AMIKACINPEAK:2,AMIKACINTROU:2,AMIKACIN:2, in the last 72 hours   Microbiology: No results found for this or any previous visit (from the past 720 hour(s)).  Medical History: Past Medical History  Diagnosis Date  . DVT (deep venous thrombosis)     had 2 in right calf & 1 in the left calf  . Cataract   . Clotting disorder   . Anemia   . Hypertension   . Peripheral vascular disease   . Fibromyalgia   . Squamous cell lung cancer 2013    sees Dr. Lendell Caprice at [facility] and Dr. Michail Sermon at [facility]     Medications:  Anti-infectives     Start     Dose/Rate Route Frequency Ordered Stop   11/18/12 1915   vancomycin (VANCOCIN) IVPB 1000 mg/200 mL premix        1,000 mg 200 mL/hr over 60 Minutes Intravenous  Once 11/18/12 1905           Assessment:  66 YOF w/ Hx lung cancer. Admitted w/ Portacath infection. Pharmacy to dose Vancomycin  60kg, Scr 0.78, WBC low, Afebrile, Bcx pending  Goal of Therapy:  Vancomycin trough level 15-20 mcg/ml  Plan:   Vancomycin 1g IV x 1 given in ER pending labs  Continue with  750mg  IV q12h.   Follow labs, vitals and cultures  Vancomycin trough at steady state   Adjust dose as appropriate  Gwen Her PharmD  [phone] 11/18/2012 7:20 PM

## 2012-11-18 NOTE — ED Provider Notes (Signed)
History    CSN: [id number] Arrival date & time 11/18/12  1714 First MD Initiated Contact with Patient 11/18/12 1848      Chief Complaint  Patient presents with  . portacath problems     HPI The patient presents to the emergency room after noticing an area of erythema around her Port-A-Cath site. Patient has history of squamous cell lung cancer. She is followed at [facility]. Patient is currently not receiving any treatments. The last time she had her Port-A-Cath accessed was several months ago. Patient noticed a little bit of redness around the Port-A-Cath site yesterday but today she noticed an increasing amount of redness associated with tenderness. She otherwise has felt well. She has not had any nausea vomiting fevers or chills. She contacted her oncologist at [facility] was instructed to come to the emergency department to have blood tests and to be given a dose of vancomycin. Past Medical History  Diagnosis Date  . DVT (deep venous thrombosis)     had 2 in right calf & 1 in the left calf  . Cataract   . Clotting disorder   . Anemia   . Hypertension   . Peripheral vascular disease   . Fibromyalgia   . Squamous cell lung cancer 2013    sees Dr. Lendell Caprice at [facility] and Dr. Michail Sermon at [facility]     Past Surgical History  Procedure Date  . Appendectomy   . Cholecystectomy   . Tonsillectomy   . Knee arthroscopy     right  . Laminectomy     lumbar  . Colonoscopy     11/28/09 repeat in 10 years  . Back surgery   . Video bronchoscopy 05/30/2012    Procedure: VIDEO BRONCHOSCOPY WITH FLUORO;  Surgeon: Storm Frisk, MD;  Location: [facility] ENDOSCOPY;  Service: Cardiopulmonary;  Laterality: N/A;  . Esophagogastroduodenoscopy October 2013    severe esophagitis, done at [facility]   . Portacath placement     Family History  Problem Relation Age of Onset  . Anemia      FE deficiency   . Arthritis      family history  . Cancer      colon 1st degree relative <60   . Hypertension      family history  . Multiple sclerosis Sister     History  Substance Use Topics  . Smoking status: Former Smoker    Quit date: 05/23/2012  . Smokeless tobacco: Never Used  . Alcohol Use: No    OB History    Grav Para Term Preterm Abortions TAB SAB Ect Mult Living                  Review of Systems  All other systems reviewed and are negative.    Allergies  Codeine and Morphine  Home Medications   Current Outpatient Rx  Name  Route  Sig  Dispense  Refill  . CYANOCOBALAMIN IJ   Injection   Inject 1 application as directed every 30 (thirty) days. Around the 22nd or 23rd of each month         . OMEPRAZOLE 20 MG PO CPDR   Oral   Take 1 capsule (20 mg total) by mouth 2 (two) times daily.   2 capsule   0   . TEMAZEPAM 30 MG PO CAPS   Oral   Take 1 capsule (30 mg total) by mouth at bedtime as needed for sleep.   30 capsule  5   . DOXYCYCLINE HYCLATE 100 MG PO TABS   Oral   Take 1 tablet (100 mg total) by mouth 2 (two) times daily.   20 tablet   0     BP 124/81  Pulse 98  Temp 98.4 F (36.9 C) (Oral)  Resp 16  SpO2 98%  Physical Exam  Nursing note and vitals reviewed. Constitutional: She appears well-developed and well-nourished. No distress.  HENT:  Head: Normocephalic and atraumatic.  Right Ear: External ear normal.  Left Ear: External ear normal.  Eyes: Conjunctivae normal are normal. Right eye exhibits no discharge. Left eye exhibits no discharge. No scleral icterus.  Neck: Neck supple. No tracheal deviation present.  Cardiovascular: Normal rate, regular rhythm and intact distal pulses.   Pulmonary/Chest: Effort normal and breath sounds normal. No stridor. No respiratory distress. She has no wheezes. She has no rales.       Port-A-Cath noted left anterior superior chest wall, mild erythema and tenderness, no induration or fluctuance area measures approximately 4-5 cm  Abdominal: Soft. Bowel sounds are normal. She exhibits no  distension. There is no tenderness. There is no rebound and no guarding.  Musculoskeletal: She exhibits no edema and no tenderness.  Neurological: She is alert. She has normal strength. No sensory deficit. Cranial nerve deficit:  no gross defecits noted. She exhibits normal muscle tone. She displays no seizure activity. Coordination normal.  Skin: Skin is warm and dry. No rash noted.  Psychiatric: She has a normal mood and affect.    ED Course  Procedures (including critical care time)  Labs Reviewed  CBC WITH DIFFERENTIAL - Abnormal; Notable for the following:    WBC 3.9 (*)     All other components within normal limits  BASIC METABOLIC PANEL - Abnormal; Notable for the following:    GFR calc non Af Amer 85 (*)     All other components within normal limits  CULTURE, BLOOD (ROUTINE X 2)  CULTURE, BLOOD (ROUTINE X 2)   No results found.   1. Cellulitis       MDM  Blood cultures were drawn. We were unable to draw cultures from the Port-A-Cath site as there was no blood return.  The patient was given a dose of vancomycin IV. She tolerated this well  Her blood tests are reassuring. She is nontoxic and afebrile. I discussed the case with her oncologist, Dr. Terrilee Croak. Patient be discharged home on a course of doxycycline. She is to followup with her doctor and is to return to emergency room for trouble with fever worsening symptoms        Celene Kras, MD 11/18/12 2105

## 2012-11-18 NOTE — ED Notes (Signed)
Patient reports that she noted a small amount of redness of her left chest portacath yesterday, but today the redness and tenderness increased. Patient notified her physician [facility] and was instructed her to come to the ED and have blood cultures obtained peripherally and from the portacath. Patient was told that she needed to have IV Vancomycin as well.

## 2012-11-18 NOTE — ED Notes (Signed)
Pt's Power Port flushes well, no blood return--- EDP, Dr. Lynelle Doctor notified--- stated it's okay to have just one set of blood culture peripherally.

## 2012-11-18 NOTE — ED Notes (Signed)
Patient noticed redness and tenderness around power port area of left chest.  She also reports tenderness to the area.  Her doctor in [place] said to come here and get blood work drawn from both sites to check for infection, then if needed IV vancomycin.  Patient has had port since July and last chemo was in Sept.

## 2012-11-24 LAB — CULTURE, BLOOD (ROUTINE X 2)

## 2012-12-01 ENCOUNTER — Ambulatory Visit: Payer: Self-pay | Admitting: Family

## 2013-01-06 ENCOUNTER — Ambulatory Visit (INDEPENDENT_AMBULATORY_CARE_PROVIDER_SITE_OTHER): Payer: Medicare Other | Admitting: Family Medicine

## 2013-01-06 ENCOUNTER — Encounter: Payer: Self-pay | Admitting: Family Medicine

## 2013-01-06 VITALS — BP 104/68 | HR 111 | Temp 98.5°F | Wt 139.0 lb

## 2013-01-06 DIAGNOSIS — J019 Acute sinusitis, unspecified: Secondary | ICD-10-CM

## 2013-01-06 MED ORDER — HYDROCODONE-HOMATROPINE 5-1.5 MG/5ML PO SYRP: 5.0000 mL | ORAL_SOLUTION | ORAL | Status: DC | PRN

## 2013-01-06 MED ORDER — AZITHROMYCIN 250 MG PO TABS: ORAL_TABLET | ORAL | Status: DC

## 2013-01-07 ENCOUNTER — Encounter: Payer: Self-pay | Admitting: Family Medicine

## 2013-01-07 NOTE — Progress Notes (Signed)
  Subjective:    Patient ID: [name], female    DOB: [birth year], 67 y.o.   MRN: [id number]  HPI Here for one week of PND, stuffy head, ST, and a dry cough. No fever.    Review of Systems  Constitutional: Negative.   HENT: Positive for congestion, postnasal drip and sinus pressure.   Eyes: Negative.   Respiratory: Positive for cough.        Objective:   Physical Exam  Constitutional: She appears well-developed and well-nourished.  HENT:  Right Ear: External ear normal.  Left Ear: External ear normal.  Nose: Nose normal.  Mouth/Throat: Oropharynx is clear and moist. No oropharyngeal exudate.  Eyes: Conjunctivae are normal.  Pulmonary/Chest: Effort normal and breath sounds normal.  Lymphadenopathy:    She has no cervical adenopathy.          Assessment & Plan:  Add Mucinex.

## 2013-01-22 ENCOUNTER — Emergency Department (HOSPITAL_COMMUNITY): Payer: Medicare Other

## 2013-01-22 ENCOUNTER — Inpatient Hospital Stay (HOSPITAL_COMMUNITY)
Admission: EM | Admit: 2013-01-22 | Discharge: 2013-01-24 | DRG: 175 | Disposition: A | Discharge status: Home / Self Care | Payer: Medicare Other | Attending: Internal Medicine | Admitting: Internal Medicine

## 2013-01-22 ENCOUNTER — Encounter (HOSPITAL_COMMUNITY): Payer: Self-pay

## 2013-01-22 DIAGNOSIS — I10 Essential (primary) hypertension: Secondary | ICD-10-CM

## 2013-01-22 DIAGNOSIS — H5310 Unspecified subjective visual disturbances: Secondary | ICD-10-CM

## 2013-01-22 DIAGNOSIS — C3490 Malignant neoplasm of unspecified part of unspecified bronchus or lung: Secondary | ICD-10-CM | POA: Diagnosis present

## 2013-01-22 DIAGNOSIS — A498 Other bacterial infections of unspecified site: Secondary | ICD-10-CM | POA: Diagnosis present

## 2013-01-22 DIAGNOSIS — IMO0002 Reserved for concepts with insufficient information to code with codable children: Secondary | ICD-10-CM

## 2013-01-22 DIAGNOSIS — M542 Cervicalgia: Secondary | ICD-10-CM | POA: Diagnosis present

## 2013-01-22 DIAGNOSIS — I739 Peripheral vascular disease, unspecified: Secondary | ICD-10-CM | POA: Diagnosis present

## 2013-01-22 DIAGNOSIS — Z923 Personal history of irradiation: Secondary | ICD-10-CM

## 2013-01-22 DIAGNOSIS — Z86718 Personal history of other venous thrombosis and embolism: Secondary | ICD-10-CM

## 2013-01-22 DIAGNOSIS — I2699 Other pulmonary embolism without acute cor pulmonale: Principal | ICD-10-CM

## 2013-01-22 DIAGNOSIS — H532 Diplopia: Secondary | ICD-10-CM

## 2013-01-22 DIAGNOSIS — E43 Unspecified severe protein-calorie malnutrition: Secondary | ICD-10-CM | POA: Diagnosis present

## 2013-01-22 DIAGNOSIS — N390 Urinary tract infection, site not specified: Secondary | ICD-10-CM | POA: Diagnosis present

## 2013-01-22 DIAGNOSIS — E876 Hypokalemia: Secondary | ICD-10-CM | POA: Diagnosis present

## 2013-01-22 DIAGNOSIS — J189 Pneumonia, unspecified organism: Secondary | ICD-10-CM

## 2013-01-22 DIAGNOSIS — Z79899 Other long term (current) drug therapy: Secondary | ICD-10-CM

## 2013-01-22 DIAGNOSIS — E785 Hyperlipidemia, unspecified: Secondary | ICD-10-CM

## 2013-01-22 DIAGNOSIS — Z87891 Personal history of nicotine dependence: Secondary | ICD-10-CM

## 2013-01-22 HISTORY — DX: Pneumonia, unspecified organism: J18.9

## 2013-01-22 HISTORY — DX: Tobacco use: Z72.0

## 2013-01-22 HISTORY — DX: Hyperlipidemia, unspecified: E78.5

## 2013-01-22 HISTORY — DX: Other specified postprocedural states: Z98.890

## 2013-01-22 HISTORY — DX: Nausea with vomiting, unspecified: R11.2

## 2013-01-22 LAB — URINE MICROSCOPIC-ADD ON

## 2013-01-22 LAB — URINALYSIS, ROUTINE W REFLEX MICROSCOPIC
Ketones, ur: NEGATIVE mg/dL
Nitrite: NEGATIVE
Protein, ur: NEGATIVE mg/dL
Urobilinogen, UA: 0.2 mg/dL (ref 0.0–1.0)

## 2013-01-22 LAB — COMPREHENSIVE METABOLIC PANEL
BUN: 9 mg/dL (ref 6–23)
CO2: 21 mEq/L (ref 19–32)
Chloride: 101 mEq/L (ref 96–112)
Creatinine, Ser: 0.65 mg/dL (ref 0.50–1.10)
GFR calc Af Amer: >90 mL/min (ref >90)
GFR calc non Af Amer: 90 mL/min — ABNORMAL LOW (ref >90)
Total Bilirubin: 0.6 mg/dL (ref 0.3–1.2)

## 2013-01-22 LAB — CBC WITH DIFFERENTIAL/PLATELET
Eosinophils Relative: 0 % (ref 0–5)
HCT: 36.5 % (ref 36.0–46.0)
Hemoglobin: 12.7 g/dL (ref 12.0–15.0)
Lymphocytes Relative: 5 % — ABNORMAL LOW (ref 12–46)
MCHC: 34.8 g/dL (ref 30.0–36.0)
MCV: 82.4 fL (ref 78.0–100.0)
Monocytes Absolute: 0.8 10*3/uL (ref 0.1–1.0)
Monocytes Relative: 10 % (ref 3–12)
Neutro Abs: 6.2 10*3/uL (ref 1.7–7.7)

## 2013-01-22 LAB — LACTIC ACID, PLASMA: Lactic Acid, Venous: 1 mmol/L (ref 0.5–2.2)

## 2013-01-22 LAB — D-DIMER, QUANTITATIVE: D-Dimer, Quant: 11.06 ug/mL-FEU — ABNORMAL HIGH (ref 0.00–0.48)

## 2013-01-22 LAB — PROTIME-INR: INR: 1.07 (ref 0.00–1.49)

## 2013-01-22 MED ORDER — HEPARIN (PORCINE) IN NACL 100-0.45 UNIT/ML-% IJ SOLN: 1100.0000 [IU]/h | INTRAMUSCULAR | Status: DC

## 2013-01-22 MED ORDER — POTASSIUM CHLORIDE IN NACL 20-0.9 MEQ/L-% IV SOLN
INTRAVENOUS | Status: DC
  Administered 2013-01-22 – 2013-01-24 (×5): via INTRAVENOUS
  Filled 2013-01-22 (×4): qty 1000

## 2013-01-22 MED ORDER — GUAIFENESIN-DM 100-10 MG/5ML PO SYRP: 5.0000 mL | ORAL_SOLUTION | ORAL | Status: DC | PRN

## 2013-01-22 MED ORDER — LEVOFLOXACIN IN D5W 500 MG/100ML IV SOLN
500.0000 mg | INTRAVENOUS | Status: DC
  Administered 2013-01-22 – 2013-01-23 (×2): 500 mg via INTRAVENOUS
  Filled 2013-01-22 (×3): qty 100

## 2013-01-22 MED ORDER — SODIUM CHLORIDE 0.9 % IV SOLN: INTRAVENOUS | Status: AC

## 2013-01-22 MED ORDER — ALBUTEROL SULFATE HFA 108 (90 BASE) MCG/ACT IN AERS
2.0000 | INHALATION_SPRAY | Freq: Four times a day (QID) | RESPIRATORY_TRACT | Status: DC | PRN
  Filled 2013-01-22 (×2): qty 6.7

## 2013-01-22 MED ORDER — TEMAZEPAM 15 MG PO CAPS
30.0000 mg | ORAL_CAPSULE | Freq: Every evening | ORAL | Status: DC | PRN
  Administered 2013-01-22 – 2013-01-23 (×2): 30 mg via ORAL
  Filled 2013-01-22 (×2): qty 2

## 2013-01-22 MED ORDER — ONDANSETRON HCL 4 MG PO TABS: 4.0000 mg | ORAL_TABLET | Freq: Four times a day (QID) | ORAL | Status: DC | PRN

## 2013-01-22 MED ORDER — OXYCODONE HCL 5 MG PO TABS: 5.0000 mg | ORAL_TABLET | ORAL | Status: DC | PRN

## 2013-01-22 MED ORDER — HEPARIN BOLUS VIA INFUSION: 3750.0000 [IU] | Freq: Once | INTRAVENOUS | Status: DC

## 2013-01-22 MED ORDER — ACETAMINOPHEN 325 MG PO TABS
650.0000 mg | ORAL_TABLET | Freq: Four times a day (QID) | ORAL | Status: DC | PRN
  Administered 2013-01-22 (×2): 650 mg via ORAL
  Filled 2013-01-22 (×2): qty 2

## 2013-01-22 MED ORDER — IOHEXOL 350 MG/ML SOLN
100.0000 mL | Freq: Once | INTRAVENOUS | Status: AC | PRN
  Administered 2013-01-22: 100 mL via INTRAVENOUS

## 2013-01-22 MED ORDER — ACETAMINOPHEN 650 MG RE SUPP: 650.0000 mg | Freq: Four times a day (QID) | RECTAL | Status: DC | PRN

## 2013-01-22 MED ORDER — NAPROXEN 250 MG PO TABS
250.0000 mg | ORAL_TABLET | Freq: Two times a day (BID) | ORAL | Status: DC
  Administered 2013-01-23: 250 mg via ORAL
  Filled 2013-01-22 (×6): qty 1

## 2013-01-22 MED ORDER — SODIUM CHLORIDE 0.9 % IV SOLN
INTRAVENOUS | Status: DC
  Administered 2013-01-22: 11:00:00 via INTRAVENOUS

## 2013-01-22 MED ORDER — PANTOPRAZOLE SODIUM 40 MG PO TBEC
40.0000 mg | DELAYED_RELEASE_TABLET | Freq: Every day | ORAL | Status: DC
  Administered 2013-01-23 – 2013-01-24 (×2): 40 mg via ORAL
  Filled 2013-01-22 (×2): qty 1

## 2013-01-22 MED ORDER — ALUM & MAG HYDROXIDE-SIMETH 200-200-20 MG/5ML PO SUSP: 30.0000 mL | Freq: Four times a day (QID) | ORAL | Status: DC | PRN

## 2013-01-22 MED ORDER — IPRATROPIUM BROMIDE 0.02 % IN SOLN
0.5000 mg | Freq: Once | RESPIRATORY_TRACT | Status: AC
  Administered 2013-01-22: 0.5 mg via RESPIRATORY_TRACT
  Filled 2013-01-22: qty 2.5

## 2013-01-22 MED ORDER — NAPROXEN SODIUM 220 MG PO TABS: 220.0000 mg | ORAL_TABLET | Freq: Two times a day (BID) | ORAL | Status: DC

## 2013-01-22 MED ORDER — LORAZEPAM 2 MG/ML IJ SOLN: 0.5000 mg | Freq: Once | INTRAMUSCULAR | Status: DC

## 2013-01-22 MED ORDER — ENOXAPARIN SODIUM 60 MG/0.6ML ~~LOC~~ SOLN
60.0000 mg | Freq: Once | SUBCUTANEOUS | Status: AC
  Administered 2013-01-23: 60 mg via SUBCUTANEOUS
  Filled 2013-01-22: qty 0.6

## 2013-01-22 MED ORDER — KETOROLAC TROMETHAMINE 30 MG/ML IJ SOLN: 30.0000 mg | Freq: Four times a day (QID) | INTRAMUSCULAR | Status: DC | PRN

## 2013-01-22 MED ORDER — ENOXAPARIN SODIUM 60 MG/0.6ML ~~LOC~~ SOLN
60.0000 mg | Freq: Two times a day (BID) | SUBCUTANEOUS | Status: DC
  Administered 2013-01-22: 60 mg via SUBCUTANEOUS
  Filled 2013-01-22 (×2): qty 0.6

## 2013-01-22 MED ORDER — ENOXAPARIN SODIUM 60 MG/0.6ML ~~LOC~~ SOLN
60.0000 mg | Freq: Two times a day (BID) | SUBCUTANEOUS | Status: DC
  Administered 2013-01-23 – 2013-01-24 (×2): 60 mg via SUBCUTANEOUS
  Filled 2013-01-22 (×4): qty 0.6

## 2013-01-22 MED ORDER — ONDANSETRON HCL 4 MG/2ML IJ SOLN: 4.0000 mg | Freq: Four times a day (QID) | INTRAMUSCULAR | Status: DC | PRN

## 2013-01-22 MED ORDER — ALBUTEROL SULFATE (5 MG/ML) 0.5% IN NEBU
5.0000 mg | INHALATION_SOLUTION | Freq: Once | RESPIRATORY_TRACT | Status: AC
  Administered 2013-01-22: 5 mg via RESPIRATORY_TRACT
  Filled 2013-01-22: qty 1

## 2013-01-22 NOTE — ED Notes (Signed)
Patient has a history of recent flu and patient continues to cough. Patient is having pain left rib cage and left neck. Patient having cough with exertion. Patient has a history of right lung cancer.

## 2013-01-22 NOTE — ED Provider Notes (Signed)
History     CSN: [id number]  Arrival date & time 01/22/13  1024   First MD Initiated Contact with Patient 01/22/13 1048      Chief Complaint  Patient presents with  . Shortness of Breath  . Cough    (Consider location/radiation/quality/duration/timing/severity/associated sxs/prior treatment) HPI Comments: [name] is a 67 y.o. female who has had a persistent cough for several weeks. She is initially treated with Zithromax, then saw her oncologist, who diagnosed influenza, type B, and recommended symptomatic treatment. In the last several, days she's developed dull left chest pain. Her cough is nonproductive. Her pain is worse with deep inspiration. There is no radiation. She denies headache, neck pain, back pain, weakness, or dizziness. There's been no nausea, or vomiting. She is using her usual medications, without relief. There are no other modifying factors.  Patient is a 67 y.o. female presenting with shortness of breath and cough. The history is provided by the patient.  Shortness of Breath Associated symptoms: cough   Cough Associated symptoms: shortness of breath     Past Medical History  Diagnosis Date  . DVT (deep venous thrombosis)     had 2 in right calf & 1 in the left calf  . Cataract   . Clotting disorder   . Anemia   . Hypertension   . Peripheral vascular disease   . Fibromyalgia   . Squamous cell lung cancer 2013    sees Dr. Lendell Caprice at [facility] and Dr. Michail Sermon at [facility]     Past Surgical History  Procedure Laterality Date  . Appendectomy    . Cholecystectomy    . Tonsillectomy    . Knee arthroscopy      right  . Laminectomy      lumbar  . Colonoscopy      11/28/09 repeat in 10 years  . Back surgery    . Video bronchoscopy  05/30/2012    Procedure: VIDEO BRONCHOSCOPY WITH FLUORO;  Surgeon: Storm Frisk, MD;  Location: [facility] ENDOSCOPY;  Service: Cardiopulmonary;  Laterality: N/A;  . Esophagogastroduodenoscopy  October  2013    severe esophagitis, done at [facility]   . Portacath placement      Family History  Problem Relation Age of Onset  . Anemia      FE deficiency   . Arthritis      family history  . Cancer      colon 1st degree relative <60  . Hypertension      family history  . Multiple sclerosis Sister     History  Substance Use Topics  . Smoking status: Former Smoker    Quit date: 05/23/2012  . Smokeless tobacco: Never Used  . Alcohol Use: No    OB History   Grav Para Term Preterm Abortions TAB SAB Ect Mult Living                  Review of Systems  Respiratory: Positive for cough and shortness of breath.   All other systems reviewed and are negative.    Allergies  Codeine and Morphine  Home Medications   Current Outpatient Rx  Name  Route  Sig  Dispense  Refill  . albuterol (PROVENTIL HFA;VENTOLIN HFA) 108 (90 BASE) MCG/ACT inhaler   Inhalation   Inhale 2 puffs into the lungs every 6 (six) hours as needed for wheezing or shortness of breath.         . CYANOCOBALAMIN IJ  Injection   Inject 1 application as directed every 30 (thirty) days. Around the 22nd or 23rd of each month         . naproxen sodium (ANAPROX) 220 MG tablet   Oral   Take 220 mg by mouth 2 (two) times daily with a meal.         . omeprazole (PRILOSEC) 20 MG capsule   Oral   Take 1 capsule (20 mg total) by mouth 2 (two) times daily.   2 capsule   0   . temazepam (RESTORIL) 30 MG capsule   Oral   Take 1 capsule (30 mg total) by mouth at bedtime as needed for sleep.   30 capsule   5     BP 92/65  Pulse 101  Temp(Src) 98 F (36.7 C)  Resp 22  SpO2 98%  Physical Exam  Nursing note and vitals reviewed. Constitutional: She is oriented to person, place, and time. She appears well-developed.  Elderly, frail  HENT:  Head: Normocephalic and atraumatic.  Eyes: Conjunctivae and EOM are normal. Pupils are equal, round, and reactive to light.  Neck: Normal range of motion and phonation  normal. Neck supple.  Cardiovascular: Normal rate, regular rhythm and intact distal pulses.   Pulmonary/Chest: Effort normal and breath sounds normal. No respiratory distress. She exhibits no tenderness.  Good air movement, scattered rhonchi  Abdominal: Soft. She exhibits no distension. There is no tenderness. There is no guarding.  Musculoskeletal: Normal range of motion.  Neurological: She is alert and oriented to person, place, and time. She has normal strength. She exhibits normal muscle tone.  Skin: Skin is warm and dry.  Psychiatric: She has a normal mood and affect. Her behavior is normal. Judgment and thought content normal.    ED Course  Procedures (including critical care time)  Initial treatment: Nebulizer, IV fluids  D-dimer, returned positive. CT angiogram chest ordered to evaluate for PE.  13:35-CT, angiogram positive for bilateral pulmonary emboli, multiple, left greater than right.  Heparin, ordered by pharmacy protocol, to treat for pulmonary emboli.      Date: 09/12/2012  Rate: 96  Rhythm: normal sinus rhythm  QRS Axis: normal  PR and QT Intervals: normal  ST/T Wave abnormalities: normal  PR and QRS Conduction Disutrbances:none  Narrative Interpretation:   Old EKG Reviewed: none available   Labs Reviewed  CBC WITH DIFFERENTIAL - Abnormal; Notable for the following:    Neutrophils Relative 85 (*)    Lymphocytes Relative 5 (*)    Lymphs Abs 0.4 (*)    All other components within normal limits  COMPREHENSIVE METABOLIC PANEL - Abnormal; Notable for the following:    Potassium 3.4 (*)    Glucose, Bld 100 (*)    GFR calc non Af Amer 90 (*)    All other components within normal limits  URINALYSIS, ROUTINE W REFLEX MICROSCOPIC - Abnormal; Notable for the following:    Color, Urine AMBER (*)    APPearance CLOUDY (*)    Leukocytes, UA SMALL (*)    All other components within normal limits  D-DIMER, QUANTITATIVE - Abnormal; Notable for the following:     D-Dimer, Quant 11.06 (*)    All other components within normal limits  URINE MICROSCOPIC-ADD ON - Abnormal; Notable for the following:    Bacteria, UA FEW (*)    All other components within normal limits  CULTURE, BLOOD (ROUTINE X 2)  CULTURE, BLOOD (ROUTINE X 2)  URINE CULTURE  LACTIC ACID, PLASMA  PROTIME-INR  APTT      1. Pulmonary emboli       MDM  Chest pain due to the pulmonary emboli. She has multiple emboli. Her clot burden is currently mild to moderate. She needs to be immediately anticoagulated to prevent worsening of her condition. She apparently, was taken off chronic Coumadin therapy, when she was treated for lung cancer. Doubt pneumonia, ACS, or serious bacterial infection.   Plan: Admit     Flint Melter, MD 01/22/13 [phone]

## 2013-01-22 NOTE — H&P (Addendum)
[facility] History and Physical  [name] ZOX:[id number] DOB: 1946/03/15 DOA: 01/22/2013  Referring physician: Dr. Gray Bernhardt PCP: [name], MD  Oncologist: Dr. Lendell Caprice Radiation oncologist: Dr. Jackelyn Poling  Chief Complaint: Shortness of breath, cough.   History of Present Illness: [name] is an 67 y.o. female with a PMH of stage III non small-cell lung CA diagnosed 05/2012 (under the care of oncologists at [facility]: Lambert Rhonda W), status post XRT/chemo, prior DVT x 3, a unspecified clotting disorder (coumadin d/c'd 10/13 due to risk of bleeding), recently diagnosed with influenza, who presented to the ER with back pain, shortness of breath and cough.  She was seen by her PCP 01/06/13 and put on Mucinex and Azithromycin at that time, but cough did not improve.  Seen in the ER at [facility] 01/11/13 and was diagnosed with influenza.  She was sent home on an inhaler.  Her cough has improved slightly but has had persistent shortness of breath.  The patient awoke with back pain last night and was urged to come to the ER by her daughter, where a CT scan of her chest revealed acute bilateral PE.  Review of Systems: Constitutional: + recent fever (none x 1 week), no chills;  Appetite diminshed; + weight loss (10 lbs in 3 weeks) HEENT: No blurry vision, + recent history of diplopia, no pharyngitis, no dysphagia CV: No chest pain, no palpitations.  Resp: + SOB, + dry cough. GI: + nausea and vomiting, + diarrhea x 2 weeks, none today, no melena, no hematochezia.  GU: No dysuria, no hematuria.  MSK: no myalgias, no arthralgias, + back pain and neck pain.  Neuro:  No headache, no focal neurological deficits, no history of seizures, +vertigo yesterday.  Psych: No depression, no anxiety.  Endo: No thyroid disease, no DM, no heat intolerance, no cold intolerance, no polyuria, no polydipsia  Skin: No rashes, no skin lesions.  Heme: + history of easy bruising, no history of blood diseases.  Past Medical  History Past Medical History  Diagnosis Date  . DVT (deep venous thrombosis)     had 2 in right calf & 1 in the left calf  . Cataract   . Clotting disorder   . Anemia   . Hypertension   . Peripheral vascular disease   . Fibromyalgia   . Squamous cell lung cancer 2013    sees Dr. Lendell Caprice at [facility] and Dr. Michail Sermon at [facility] Rad[facility] On[facility]   . Hyperlipidemia   . Tobacco abuse   . Community acquired pneumonia 01/22/2013     Past Surgical History Past Surgical History  Procedure Laterality Date  . Appendectomy    . Cholecystectomy    . Tonsillectomy    . Knee arthroscopy      right  . Laminectomy      lumbar  . Colonoscopy      11/28/09 repeat in 10 years  . Video bronchoscopy  05/30/2012    Procedure: VIDEO BRONCHOSCOPY WITH FLUORO;  Surgeon: Storm Frisk, MD;  Location: [facility] ENDOSCOPY;  Service: Cardiopulmonary;  Laterality: N/A;  . Esophagogastroduodenoscopy  October 2013    severe esophagitis, done at [facility]   . Portacath placement       Social History: History   Social History  . Marital Status: Married    Spouse Name: Mathis Fare    Number of Children: 3  . Years of Education: N/A   Occupational History  . Retired Social worker.    Social History Main  Topics  . Smoking status: Former Smoker    Quit date: 05/23/2012  . Smokeless tobacco: Never Used  . Alcohol Use: No  . Drug Use: No  . Sexually Active: Not on file   Other Topics Concern  . Not on file   Social History Narrative   Married.  Lives with husband in [place].  Independent of ADLs.    Family History:  Family History  Problem Relation Age of Onset  . Anemia      FE deficiency   . Arthritis      family history  . Cancer      colon 1st degree relative <60  . Hypertension Mother     family history  . Multiple sclerosis Sister   . Atrial fibrillation Mother   . Cancer Father     NHL  . Cancer Mother     Lung    Allergies: Codeine and Morphine  Meds: Prior to Admission  medications   Medication Sig Start Date End Date Taking? Authorizing Provider  albuterol (PROVENTIL HFA;VENTOLIN HFA) 108 (90 BASE) MCG/ACT inhaler Inhale 2 puffs into the lungs every 6 (six) hours as needed for wheezing or shortness of breath.   Yes Historical Provider, MD  CYANOCOBALAMIN IJ Inject 1 application as directed every 30 (thirty) days. Around the 22nd or 23rd of each month   Yes Historical Provider, MD  naproxen sodium (ANAPROX) 220 MG tablet Take 220 mg by mouth 2 (two) times daily with a meal.   Yes Historical Provider, MD  omeprazole (PRILOSEC) 20 MG capsule Take 1 capsule (20 mg total) by mouth 2 (two) times daily. 10/02/12  Yes [name], MD  temazepam (RESTORIL) 30 MG capsule Take 1 capsule (30 mg total) by mouth at bedtime as needed for sleep. 10/15/12  Yes [name], MD    Physical Exam: Filed Vitals:   01/22/13 1039 01/22/13 1203 01/22/13 1300 01/22/13 1432  BP: 92/65   119/78  Pulse: 101   107  Temp: 98 F (36.7 C)     Resp: 22   14  Height:   5' 4.57" (1.64 m)   Weight:   63 kg (138 lb 14.2 oz)   SpO2: 95% 98%  95%     Physical Exam: Blood pressure 119/78, pulse 107, temperature 98 F (36.7 C), resp. rate 14, height 5' 4.57" (1.64 m), weight 63 kg (138 lb 14.2 oz), SpO2 95.00%. Gen: No acute distress. Head: Normocephalic, atraumatic. Eyes: PERRL, EOMI, sclerae nonicteric. Mouth: Oropharynx clear with moist mucous membranes. Edentulous upper jaw. Fair dentition lower jaw. Neck: Supple, no thyromegaly, no lymphadenopathy, no jugular venous distention. Chest: Lungs clear to auscultation bilaterally. CV: Heart sounds are tachycardic but without murmurs, rubs, or gallops. Abdomen: Soft, nontender, nondistended with normal active bowel sounds. Extremities: Extremities are without clubbing, edema, or cyanosis. Skin: Warm and dry. No rashes. Neuro: Alert and oriented times 3; cranial nerves II through XII grossly intact. Psych: Mood and affect  normal.  Labs on Admission:  Basic Metabolic Panel:  Recent Labs Lab 01/22/13 1125  NA 136  K 3.4*  CL 101  CO2 21  GLUCOSE 100*  BUN 9  CREATININE 0.65  CALCIUM 9.5   Liver Function Tests:  Recent Labs Lab 01/22/13 1125  AST 14  ALT 8  ALKPHOS 55  BILITOT 0.6  PROT 6.8  ALBUMIN 3.5   CBC:  Recent Labs Lab 01/22/13 1125  WBC 7.3  NEUTROABS 6.2  HGB 12.7  HCT 36.5  MCV 82.4  PLT 242    Radiological Exams on Admission: Dg Chest 2 View  01/22/2013  *RADIOLOGY REPORT*  Clinical Data: Recent flu. Persistent cough.  Left-sided rib and neck pain. History of lung cancer, status post chemotherapy and radiation therapy.  CHEST - 2 VIEW  Comparison: CT 05/27/2012.Plain films 05/26/2012.  Findings: A left-sided Port-A-Cath which terminates at the mid SVC. Volume loss in the right hemithorax with elevation of the right hemidiaphragm. Normal heart size.  No pleural effusion or pneumothorax.  Vague right midlung interstitial opacity on the frontal, likely corresponding to increased posterior density on the lateral. Clear left lung with interstitial thickening. Cholecystectomy clips.  IMPRESSION: Right-sided posterior interstitial opacity with concurrent volume loss.  This appearance is nonspecific.  It could represent a surgical or treatment effects.  A focus of infection or residual tumor cannot be entirely excluded.  Short-term radiographic follow- up versus contrast enhanced CT should be considered.   Original Report Authenticated By: Jeronimo Greaves, M.D.    Ct Angio Chest Pe W/cm &/or Wo Cm  01/22/2013  *RADIOLOGY REPORT*  Clinical Data: Shortness of breath, elevated D-dimer.  Lung cancer with prior chemotherapy and radiation.  CT ANGIOGRAPHY CHEST  Technique:  Multidetector CT imaging of the chest using the standard protocol during bolus administration of intravenous contrast. Multiplanar reconstructed images including MIPs were obtained and reviewed to evaluate the vascular  anatomy.  Contrast: OMNIPAQUE IOHEXOL 350 MG/ML SOLN  Comparison: CT 05/27/2012  Findings: Pulmonary emboli are noted bilaterally.  This is more pronounced on the left with involvement of the lingular branches and left lower lobe branches.  Right posterior lower lobe branch involvement also noted.  There are small bilateral pleural effusions.  Patchy right lung airspace disease present.  The previously seen mass in the right middle lobe has improved significantly since prior study, measuring approximately 10 x 8 mm on today's study compared to 3.6 x 2.7 cm previously.  Airspace disease in the right upper lobe and right lower lobe could be related to pneumonia or post radiation changes. Dependent atelectasis in the lung bases.  Improving mediastinal adenopathy.  Right paratracheal node much smaller and difficult to measure on today's study.  Heart is borderline in size.  Aorta is normal caliber.  Left Port-A- Cath is in place.  Imaging into the upper abdomen shows no acute findings.  Mild degenerative changes in the mid to lower thoracic spine.  No acute bony abnormality.  IMPRESSION: Bilateral pulmonary emboli, slightly more pronounced on the left involving both the left upper lobe and lower lobe as well as right lower lobe.  Airspace disease throughout the right lung.  Given the patients history of radiation, this could represent radiation changes. Infection is not excluded.  Improving right lung mass with measurements as above.  Small bilateral effusions.  These results were discussed with Dr. Effie Shy at the time of interpretation.   Original Report Authenticated By: Charlett Nose, M.D.     EKG: Independently reviewed. Sinus rhythm at 96 beats per minute. T wave inversions noted in V1.  Assessment/Plan Principal Problem:   Acute pulmonary emboli, bilateral, with shortness of breath and back pain -Will place on empiric Lovenox. I am attempting to contact her primary oncologist to discuss chronic  anticoagulation with her. -Will also check a hypercoagulability profile given that this has never been done before. Active Problems:   Neck pain -Given high-risk of blood clots and new onset of left-sided neck pain, will obtain left-sided carotid Doppler to  ensure patency of carotid artery.   Hypokalemia -Will add potassium to IV fluids.   Community acquired pneumonia -Will treat with Levaquin. -Antitussives and bronchodilator therapy as needed.   Non-small cell lung cancer status post chemotherapy and radiation therapy -And attempting to contact the patient's primary oncologist to discuss treatment issues.  Code Status: Full. Family Communication: Selinda Flavin (daughter) [phone] Disposition Plan: Home when stable.  Time spent: 1 hour.  Rilan Eiland [facility] Pager [phone]  If 7PM-7AM, please contact night-coverage [email or web address] Password [facility] 01/22/2013, 2:56 PM

## 2013-01-22 NOTE — Progress Notes (Addendum)
ANTICOAGULATION CONSULT NOTE - Initial Consult  Pharmacy Consult for IV heparin then switched to Lovenox Indication: acute bilateral PE  Allergies  Allergen Reactions  . Codeine Other (See Comments)    Extreme sleepiness  . Morphine Other (See Comments)    Extreme sleepiness    Patient Measurements: Height: 5' 4.57" (164 cm) (as documented 01/06/2013) Weight: 138 lb 14.2 oz (63 kg) (as documented 01/06/2013) IBW/kg (Calculated) : 56 Heparin Dosing Weight: 63 kg   Vital Signs: Temp: 98 F (36.7 C) (02/27 1039) BP: 92/65 mmHg (02/27 1039) Pulse Rate: 101 (02/27 1039)  Labs:  Recent Labs  01/22/13 1125  HGB 12.7  HCT 36.5  PLT 242  CREATININE 0.65    Estimated Creatinine Clearance: 60.3 ml/min (by C-G formula based on Cr of 0.65).   Medical History: Past Medical History  Diagnosis Date  . DVT (deep venous thrombosis)     had 2 in right calf & 1 in the left calf  . Cataract   . Clotting disorder   . Anemia   . Hypertension   . Peripheral vascular disease   . Fibromyalgia   . Squamous cell lung cancer 2013    sees Dr. Lendell Caprice at [facility] and Dr. Michail Sermon at [facility] Radiation Oncology     Assessment: 53 yof presented 2/27 with persistent cough x several weeks. Pt was initially receiving abx, then changed to symptomatic treatment given diagnosed of influenza, type B. CT angio/chest 2/27 revealed bilateral PE, MD ordering to start IV heparin.  Of note, patient was on Coumadin for h/o DVT but this was discontinued in November of 2013 by oncologist given complications of chemotherapy per anti-coag clinic note (will f/u).   Baseline INR and APTT ordered by MD - awaiting result. CBC wnl. Pt wts 63 kg, normal SCr   Goal of Therapy:  Heparin level 0.3-0.7 units/ml Monitor platelets by anticoagulation protocol: Yes   Plan:   IV heparin 3750 units x 1 as bolus then 1100 units/hr  Heparin level 6 hours post-drip initiation  Daily heparin level and CBC    Pharmacy will f/u   Geoffry Paradise, PharmD, BCPS Pager: [phone] 1:53 PM Pharmacy #: [id number]   Addendum:   MD discontinued IV heparin (not yet given) and ordered for Lovenox per pharmacy.  Pt's last wt was 63 kg this month.   Plan: D/C IV heparin labs.  Start Lovenox 60 mg sq q12h.  Pharmacy will f/u  Geoffry Paradise, PharmD, BCPS Pager: [phone] 2:48 PM Pharmacy #: [id number]

## 2013-01-22 NOTE — ED Notes (Signed)
Patient transported to X-ray 

## 2013-01-22 NOTE — Progress Notes (Signed)
VASCULAR LAB PRELIMINARY  PRELIMINARY  PRELIMINARY  PRELIMINARY  Carotid duplex completed.    Preliminary report:  Bilateral:  No evidence of hemodynamically significant internal carotid artery stenosis.   Vertebral artery flow is antegrade.     [name], RVS 01/22/2013, 4:03 PM

## 2013-01-23 DIAGNOSIS — I2699 Other pulmonary embolism without acute cor pulmonale: Secondary | ICD-10-CM

## 2013-01-23 LAB — BASIC METABOLIC PANEL
BUN: 6 mg/dL (ref 6–23)
CO2: 23 mEq/L (ref 19–32)
Calcium: 8.9 mg/dL (ref 8.4–10.5)
Creatinine, Ser: 0.63 mg/dL (ref 0.50–1.10)

## 2013-01-23 LAB — CBC
HCT: 32.7 % — ABNORMAL LOW (ref 36.0–46.0)
MCH: 28.4 pg (ref 26.0–34.0)
MCV: 84.3 fL (ref 78.0–100.0)
Platelets: 210 10*3/uL (ref 150–400)
RDW: 13.1 % (ref 11.5–15.5)

## 2013-01-23 LAB — PROTEIN S ACTIVITY: Protein S Activity: 73 % (ref 69–129)

## 2013-01-23 LAB — HOMOCYSTEINE: Homocysteine: 10.3 umol/L (ref 4.0–15.4)

## 2013-01-23 LAB — LUPUS ANTICOAGULANT PANEL
DRVVT: 41.8 secs (ref <42.9)
Lupus Anticoagulant: NOT DETECTED
PTT Lupus Anticoagulant: 38.8 secs (ref 28.0–43.0)

## 2013-01-23 LAB — PROTEIN S, TOTAL: Protein S Ag, Total: 80 % (ref 60–150)

## 2013-01-23 MED ORDER — WARFARIN - PHARMACIST DOSING INPATIENT: Freq: Every day | Status: DC

## 2013-01-23 MED ORDER — WARFARIN SODIUM 7.5 MG PO TABS
7.5000 mg | ORAL_TABLET | Freq: Once | ORAL | Status: AC
  Administered 2013-01-23: 7.5 mg via ORAL
  Filled 2013-01-23: qty 1

## 2013-01-23 MED ORDER — BOOST PLUS PO LIQD
237.0000 mL | Freq: Every day | ORAL | Status: DC
  Administered 2013-01-23: 237 mL via ORAL
  Filled 2013-01-23 (×2): qty 237

## 2013-01-23 MED ORDER — VITAMINS A & D EX OINT
TOPICAL_OINTMENT | CUTANEOUS | Status: AC
  Administered 2013-01-23: 21:00:00
  Filled 2013-01-23: qty 5

## 2013-01-23 NOTE — Progress Notes (Signed)
ANTICOAGULATION CONSULT NOTE - Initial Consult  Pharmacy Consult for Lovenox and Coumadin Indication: acute bilateral PE  Allergies  Allergen Reactions  . Codeine Other (See Comments)    Extreme sleepiness  . Morphine Other (See Comments)    Extreme sleepiness   Patient Measurements: Height: 5\' 5"  (165.1 cm) Weight: 138 lb 3.7 oz (62.7 kg) IBW/kg (Calculated) : 57 Heparin Dosing Weight: 63 kg   Vital Signs: Temp: 98.2 F (36.8 C) (02/28 0626) Temp src: Oral (02/28 0626) BP: 124/83 mmHg (02/28 0626) Pulse Rate: 75 (02/28 0626)  Labs:  Recent Labs  01/22/13 1125 01/22/13 1353 01/23/13 0404  HGB 12.7  --  11.0*  HCT 36.5  --  32.7*  PLT 242  --  210  APTT  --  25  --   LABPROT  --  13.8  --   INR  --  1.07  --   CREATININE 0.65  --  0.63   Estimated Creatinine Clearance: 61.4 ml/min (by C-G formula based on Cr of 0.63).  Assessment:  23 yof presented 2/27 with persistent cough x several weeks. Started on empiric Levaquin for possible pna. Also diagnosed earlier this month for influenza B.  CT angio/chest revealed bilateral PE, so started on IV heparin then switched to Lovenox.  Resuming Coumadin today. Will need 5 day overlap with Lovenox and INR therapeutic x 24hrs per CHEST guidelines.  Patient previously on Coumadin for h/o DVT but was discontinued in November 2013 by her oncologist at [facility] given complications of chemotherapy per anti-coag clinic note. Most recent dose was 5mg  daily except 7.5mg  Tues/Thurs.  Baseline INR and APTT wnl.  CBC wnl.   Levaquin can increase sensitivity to Coumadin.  Tolerating reg diet.  Goal of Therapy: INR: 2-3  Monitor platelets by anticoagulation protocol: Yes   Plan:   Give Coumadin 7.5mg  PO today at 12:00. If discharged, recommend 5mg  daily except 7.5mg  on Tues/Thurs.  Cont Lovenox 60mg  SQ q12h.  Daily PT/INR.  Charolotte Eke, PharmD, pager [phone]. 01/23/2013,10:14 AM.

## 2013-01-23 NOTE — Progress Notes (Signed)
INITIAL NUTRITION ASSESSMENT  Pt meets criteria for severe MALNUTRITION in the context of acute illness as evidenced by <50% estimated energy intake with 6.7% weight loss in the past 3 weeks per pt report.  DOCUMENTATION CODES Per approved criteria  -Severe malnutrition in the context of acute illness or injury   INTERVENTION: - Boost Plus daily - Encouraged continued excellent intake - Will continue to monitor   NUTRITION DIAGNOSIS: Increased nutrient needs related to stage III non small cell lung CA as evidenced by MD notes.   Goal: Pt to consume >90% of meals/supplements  Monitor:  Weights, labs, intake   Reason for Assessment: Nutrition risk   67 y.o. female  Admitting Dx: Acute pulmonary embolism  ASSESSMENT: Pt with stage III non-small cell lung CA s/p chemoradiation recently diagnosed with influenza. Pt admitted with nausea and vomiting in addition to 3 week history of diarrhea. Pt reports poor intake in the past 3 weeks with 10 pound unintended weight loss due to influenza. Pt reports before then she was eating better, trying to eat 2 meals/day and drink 1 Boost/day. Pt reports she had been gaining weight after radiation up until she got influenza when she lost the 10 pounds. Pt has history of a pill getting caught in her esophagus for 2 months. Pt occasionally still feels like foods are caught in the lower part of her esophagus which she attributes to radiation in addition to history of acute esophagitis. Pt denies any nausea, vomiting, or diarrhea today. Pt ate 100% of chicken sandwich on lunch tray.   Height: Ht Readings from Last 1 Encounters:  01/22/13 5\' 5"  (1.651 m)    Weight: Wt Readings from Last 1 Encounters:  01/22/13 138 lb 3.7 oz (62.7 kg)    Ideal Body Weight: 125 lb  % Ideal Body Weight: 110  Wt Readings from Last 10 Encounters:  01/22/13 138 lb 3.7 oz (62.7 kg)  01/06/13 139 lb (63.05 kg)  10/02/12 134 lb (60.782 kg)  08/20/12 140 lb (63.504  kg)  06/04/12 143 lb 8 oz (65.091 kg)  05/30/12 157 lb 10.1 oz (71.5 kg)  05/30/12 157 lb 10.1 oz (71.5 kg)  05/15/12 148 lb (67.132 kg)  01/02/12 150 lb (68.04 kg)  11/19/11 152 lb (68.947 kg)    Usual Body Weight: 140-143 lb  % Usual Body Weight: 96-98  BMI:  Body mass index is 23 kg/(m^2).  Estimated Nutritional Needs: Kcal: 1550-1850 Protein: 75-95g Fluid: 1.5-1.8L/day  Skin: Intact  Diet Order: General  EDUCATION NEEDS: -No education needs identified at this time   Intake/Output Summary (Last 24 hours) at 01/23/13 1421 Last data filed at 01/23/13 1356  Gross per 24 hour  Intake   2203 ml  Output      0 ml  Net   2203 ml    Last BM: 2/26  Labs:   Recent Labs Lab 01/22/13 1125 01/23/13 0404  NA 136 138  K 3.4* 3.6  CL 101 105  CO2 21 23  BUN 9 6  CREATININE 0.65 0.63  CALCIUM 9.5 8.9  GLUCOSE 100* 87    CBG (last 3)  No results found for this basename: GLUCAP,  in the last 72 hours  Scheduled Meds: . sodium chloride   Intravenous STAT  . enoxaparin (LOVENOX) injection  60 mg Subcutaneous Q12H  . levofloxacin (LEVAQUIN) IV  500 mg Intravenous Q24H  . LORazepam  0.5 mg Intravenous Once  . naproxen  250 mg Oral BID WC  . pantoprazole  40 mg Oral Daily  . Warfarin - Pharmacist Dosing Inpatient   Does not apply q1800    Continuous Infusions: . 0.9 % NaCl with KCl 20 mEq / L 75 mL/hr at 01/23/13 4696    Past Medical History  Diagnosis Date  . DVT (deep venous thrombosis)     had 2 in right calf & 1 in the left calf  . Cataract   . Clotting disorder   . Anemia   . Hypertension   . Peripheral vascular disease   . Fibromyalgia   . Squamous cell lung cancer 2013    sees Dr. Lendell Caprice at [facility] and Dr. Michail Sermon at [facility] Radiation Oncology   . Hyperlipidemia   . Tobacco abuse   . Community acquired pneumonia 01/22/2013  . PONV (postoperative nausea and vomiting)     Past Surgical History  Procedure Laterality Date  . Appendectomy     . Cholecystectomy    . Tonsillectomy    . Knee arthroscopy      right  . Laminectomy      lumbar  . Colonoscopy      11/28/09 repeat in 10 years  . Video bronchoscopy  05/30/2012    Procedure: VIDEO BRONCHOSCOPY WITH FLUORO;  Surgeon: Storm Frisk, MD;  Location: [facility] ENDOSCOPY;  Service: Cardiopulmonary;  Laterality: N/A;  . Esophagogastroduodenoscopy  October 2013    severe esophagitis, done at [facility]   . Portacath placement       Levon Hedger MS, RD, LDN [phone] Pager [phone] After Hours Pager

## 2013-01-23 NOTE — Care Management Note (Signed)
Cm spoke with patient concerning discharge planning. Pt states Dr.Frye, her PCP, follows her coumadin levels. CM is contacting pt's insurance company concerning cost of lovenox injections and if prior authorization needed for medication. Pt states adult daughter assist in home care. Pt states PTA independent. No HH services or DME needed. Will follow up with information provided by insurance company.   Roxy Manns Airianna Kreischer,RN,BSN [phone]

## 2013-01-23 NOTE — Progress Notes (Addendum)
[facility] PROGRESS NOTE  [name] DOB: 1946-01-28 DOA: 01/22/2013 PCP: [name], MD  Brief narrative: [name] is an 67 y.o. female with a PMH of stage III non small-cell lung CA diagnosed 05/2012 (under the care of oncologists at [facility]), status post XRT/chemo, prior DVT x 3, a unspecified clotting disorder (coumadin d/c'd 10/13 due to risk of bleeding), recently diagnosed with influenza, who presented to the ER with back pain, shortness of breath and cough on 01/22/13. She was seen by her PCP 01/06/13 and put on Mucinex and Azithromycin at that time, but cough did not improve. Seen in the ER at [facility] 01/11/13 and was diagnosed with influenza. She was sent home on an inhaler. Her cough has improved slightly but had persistent shortness of breath. The patient awoke with back pain the night prior to admission and was urged to come to the ER by her daughter, where a CT scan of her chest revealed acute bilateral PE.  Assessment/Plan: Principal Problem:  Acute pulmonary emboli, bilateral, with shortness of breath and back pain  -Continue empiric Lovenox and add coumadin, which, after a discussion with her oncologist, was felt to be the best option for long term anti-coagulation. -F/U hypercoagulability profile. So far Antithrombin III studies and homocysteine studies WNL.  Protein C studies, protein S studies, lupus anticoagulant, beta-2-glycoprotein, factor 5 leiden, anticardiolipin antibodies pending. Active Problems:  Severe malnutrition in the context of acute illness -Status post dietician evaluation 01/23/13.  Continue Boost supplement. Neck pain  -Carotid duplex without evidence of carotid artery stenosis.  Hypokalemia  -Corrected with potassium added to IV fluids.  Community acquired pneumonia  -Continue empiric Levaquin.  -Antitussives and bronchodilator therapy as needed.  Non-small cell lung cancer status post chemotherapy and radiation therapy   -The patient will F/U with her oncologist post-discharge.  Code Status: Full.  Family Communication: Selinda Flavin (daughter) [phone]  Disposition Plan: Home when stable.  Medical Consultants:  Telephone consultation with Dr. Lendell Caprice, Oncology.  Other Consultants:  Pharmacy  Anti-infectives:  Levaquin 01/22/13--->  HPI/Subjective: [name] is feeling better today.  Her back pain is gone.  She still has a dry cough.  She is less short of breath.    Objective: Filed Vitals:   01/22/13 1432 01/22/13 1511 01/22/13 2123 01/23/13 0626  BP: 119/78 152/85 109/71 124/83  Pulse: 107 110 94 75  Temp:  98 F (36.7 C) 98.4 F (36.9 C) 98.2 F (36.8 C)  TempSrc:  Oral Oral Oral  Resp: 14 18 18 18   Height:  5\' 5"  (1.651 m)    Weight:  62.7 kg (138 lb 3.7 oz)    SpO2: 95% 97% 95% 97%    Intake/Output Summary (Last 24 hours) at 01/23/13 0851 Last data filed at 01/23/13 0838  Gross per 24 hour  Intake   1163 ml  Output      0 ml  Net   1163 ml    Exam: Gen:  NAD Cardiovascular:  RRR, No M/R/G Respiratory:  Lungs CTAB Gastrointestinal:  Abdomen soft, NT/ND, + BS Extremities:  No C/E/C  Data Reviewed: Basic Metabolic Panel:  Recent Labs Lab 01/22/13 1125 01/23/13 0404  NA 136 138  K 3.4* 3.6  CL 101 105  CO2 21 23  GLUCOSE 100* 87  BUN 9 6  CREATININE 0.65 0.63  CALCIUM 9.5 8.9   GFR Estimated Creatinine Clearance: 61.4 ml/min (by C-G formula based on Cr of 0.63). Liver Function Tests:  Recent  Labs Lab 01/22/13 1125  AST 14  ALT 8  ALKPHOS 55  BILITOT 0.6  PROT 6.8  ALBUMIN 3.5   Coagulation profile  Recent Labs Lab 01/22/13 1353  INR 1.07    CBC:  Recent Labs Lab 01/22/13 1125 01/23/13 0404  WBC 7.3 3.3*  NEUTROABS 6.2  --   HGB 12.7 11.0*  HCT 36.5 32.7*  MCV 82.4 84.3  PLT 242 210   D-Dimer  Recent Labs  01/22/13 1125  DDIMER 11.06*   Hypercoagulable Panel:   Ref. Range 01/22/2013 16:20  AntiThromb III  Func Latest Range: 75-120 % 76    Ref. Range 01/22/2013 16:20  Homocysteine Latest Range: 4.0-15.4 umol/L 10.3   Microbiology Recent Results (from the past 240 hour(s))  CULTURE, BLOOD (ROUTINE X 2)     Status: None   Collection Time    01/22/13 11:25 AM      Result Value Range Status   Specimen Description BLOOD RIGHT HAND   Final   Special Requests BOTTLES DRAWN AEROBIC AND ANAEROBIC   Final   Culture  Setup Time 01/22/2013 16:08   Final   Culture     Final   Value:        BLOOD CULTURE RECEIVED NO GROWTH TO DATE CULTURE WILL BE HELD FOR 5 DAYS BEFORE ISSUING A FINAL NEGATIVE REPORT   Report Status PENDING   Incomplete  CULTURE, BLOOD (ROUTINE X 2)     Status: None   Collection Time    01/22/13 11:30 AM      Result Value Range Status   Specimen Description BLOOD LEFT ANTECUBITAL   Final   Special Requests BOTTLES DRAWN AEROBIC AND ANAEROBIC   Final   Culture  Setup Time 01/22/2013 16:08   Final   Culture     Final   Value:        BLOOD CULTURE RECEIVED NO GROWTH TO DATE CULTURE WILL BE HELD FOR 5 DAYS BEFORE ISSUING A FINAL NEGATIVE REPORT   Report Status PENDING   Incomplete     Procedures and Diagnostic Studies:  Dg Chest 2 View 01/22/2013  IMPRESSION: Right-sided posterior interstitial opacity with concurrent volume loss.  This appearance is nonspecific.  It could represent a surgical or treatment effects.  A focus of infection or residual tumor cannot be entirely excluded.  Short-term radiographic follow- up versus contrast enhanced CT should be considered.   Original Report Authenticated By: Jeronimo Greaves, M.D.     Ct Angio Chest Pe W/cm &/or Wo Cm 01/22/2013 IMPRESSION: Bilateral pulmonary emboli, slightly more pronounced on the left involving both the left upper lobe and lower lobe as well as right lower lobe.  Airspace disease throughout the right lung.  Given the patients history of radiation, this could represent radiation changes. Infection is not excluded.   Improving right lung mass with measurements as above.  Small bilateral effusions.  These results were discussed with Dr. Effie Shy at the time of interpretation.   Original Report Authenticated By: Charlett Nose, M.D.     Scheduled Meds: . sodium chloride   Intravenous STAT  . enoxaparin (LOVENOX) injection  60 mg Subcutaneous Q12H  . levofloxacin (LEVAQUIN) IV  500 mg Intravenous Q24H  . LORazepam  0.5 mg Intravenous Once  . naproxen  250 mg Oral BID WC  . pantoprazole  40 mg Oral Daily   Continuous Infusions: . 0.9 % NaCl with KCl 20 mEq / L 75 mL/hr at 01/23/13 7829    Time  spent: 35 minutes   LOS: 1 day   Chriss Redel  [facility] Pager [phone].  If 8PM-8AM, please contact night-coverage at [email or web address], password [facility] 01/23/2013, 8:51 AM

## 2013-01-23 NOTE — Progress Notes (Signed)
Pt rated her pain a 6/10 a couple hours after a dose of tylenol was given as ordered.  She stated she was more comfortable at a level of a 3 or 4.  With prior pain med experience she requested 5mg  of oxy IR with 0.5mg  ativan for associated nausea.  Family agreed that this was what she found to work for her pain in the past.  Notified MD.  Received new orders.  Will continue to monitor the pt. Daphene Calamity Victoria 4:55 AM 01/23/2013

## 2013-01-24 LAB — BASIC METABOLIC PANEL
BUN: 5 mg/dL — ABNORMAL LOW (ref 6–23)
Calcium: 9.3 mg/dL (ref 8.4–10.5)
Chloride: 106 mEq/L (ref 96–112)
Creatinine, Ser: 0.64 mg/dL (ref 0.50–1.10)
GFR calc Af Amer: >90 mL/min (ref >90)
GFR calc non Af Amer: >90 mL/min (ref >90)

## 2013-01-24 LAB — CBC
HCT: 35.3 % — ABNORMAL LOW (ref 36.0–46.0)
MCHC: 33.4 g/dL (ref 30.0–36.0)
MCV: 83.8 fL (ref 78.0–100.0)
Platelets: 225 10*3/uL (ref 150–400)
RDW: 13.1 % (ref 11.5–15.5)
WBC: 2.8 10*3/uL — ABNORMAL LOW (ref 4.0–10.5)

## 2013-01-24 LAB — URINE CULTURE: Colony Count: >=100000

## 2013-01-24 LAB — PROTIME-INR: Prothrombin Time: 14.5 seconds (ref 11.6–15.2)

## 2013-01-24 MED ORDER — PROCHLORPERAZINE EDISYLATE 5 MG/ML IJ SOLN
10.0000 mg | Freq: Four times a day (QID) | INTRAMUSCULAR | Status: DC | PRN
  Administered 2013-01-24: 10 mg via INTRAVENOUS
  Filled 2013-01-24: qty 2

## 2013-01-24 MED ORDER — LEVOFLOXACIN 500 MG PO TABS: 500.0000 mg | ORAL_TABLET | Freq: Every day | ORAL | Status: DC

## 2013-01-24 MED ORDER — WARFARIN SODIUM 5 MG PO TABS: 5.0000 mg | ORAL_TABLET | Freq: Every day | ORAL | Status: DC

## 2013-01-24 MED ORDER — ENOXAPARIN SODIUM 60 MG/0.6ML ~~LOC~~ SOLN: 60.0000 mg | Freq: Two times a day (BID) | SUBCUTANEOUS | Status: DC

## 2013-01-24 NOTE — Discharge Summary (Signed)
Physician Discharge Summary  [name]:[id number] DOB: 11-06-1946 DOA: 01/22/2013  PCP: [name], MD  Admit date: 01/22/2013 Discharge date: 01/24/2013  Recommendations for Outpatient Follow-up:  1. Recommend close monitoring of PT/INR until therapeutic.  Instructed to F/U with PCP 01/26/13 for a PT/INR check.  Note: The patient is on an antibiotic which may also impact INR.   2. Please F/U final urine culture sensitivities.   3. The patient has follow up scheduled with a hematologist at [facility] for further evaluation of hypercoagulable state.  Discharge Diagnoses:  Principal Problem:    Acute pulmonary emboli, bilateral, with shortness of breath and back pain Active Problems:    Hypokalemia    Community acquired pneumonia    Non-small cell lung cancer status post chemotherapy and radiation therapy    Severe malnutrition in the context of acute illness   Discharge Condition: Improved.  Diet recommendation: Regular.  History of present illness:  [name] is an 67 y.o. female with a PMH of stage III non small-cell lung CA diagnosed 05/2012 (under the care of oncologists at [facility]), status post XRT/chemo, prior DVT x 3, a unspecified clotting disorder (coumadin d/c'd 10/13 due to risk of bleeding), recently diagnosed with influenza, who presented to the ER with back pain, shortness of breath and cough on 01/22/13. She was seen by her PCP 01/06/13 and put on Mucinex and Azithromycin at that time, but cough did not improve. Seen in the ER at [facility] 01/11/13 and was diagnosed with influenza. She was sent home on an inhaler. Her cough had improved slightly but had persistent shortness of breath. The patient awoke with back pain the night prior to admission and was urged to come to the ER by her daughter, where a CT scan of her chest revealed acute bilateral PE.  Hospital Course by problem:  Principal Problem:  Acute pulmonary emboli, bilateral, with shortness of breath  and back pain  -Continue empiric Lovenox and add coumadin, which, after a discussion with her oncologist, was felt to be the best option for long term anti-coagulation. Will need close F/U of PT/INR. -F/U hypercoagulability tests outstanding (beta-2-glycoprotein, factor 5 leiden).  Other tests as noted below.   Active Problems: Enterococcal UTI -Preliminary culture + for enterococcus. -On empiric Levaquin.  Recommend PCP F/U final culture sensitivity results. Severe malnutrition in the context of acute illness  -Status post dietician evaluation 01/23/13. Provided with Boost supplements.  Neck pain  -Carotid duplex without evidence of carotid artery stenosis.  Hypokalemia  -Corrected with potassium added to IV fluids.  Community acquired pneumonia  -Continue Levaquin for a total course of therapy of 1 week.  -Antitussives and bronchodilator therapy as needed.  Non-small cell lung cancer status post chemotherapy and radiation therapy  -The patient will F/U with her oncologist post-discharge.  Discharge Exam: Filed Vitals:   01/24/13 0645  BP: 121/82  Pulse: 72  Temp: 98.1 F (36.7 C)  Resp: 16   Filed Vitals:   01/23/13 0626 01/23/13 1435 01/23/13 2254 01/24/13 0645  BP: 124/83 124/77 108/76 121/82  Pulse: 75 93 84 72  Temp: 98.2 F (36.8 C) 97.2 F (36.2 C) 98.2 F (36.8 C) 98.1 F (36.7 C)  TempSrc: Oral Oral Oral Oral  Resp: 18 18 18 16   Height:      Weight:      SpO2: 97% 95% 96% 97%    Gen:  NAD Cardiovascular:  RRR, No M/R/G Respiratory: Lungs CTAB Gastrointestinal: Abdomen soft, NT/ND with  normal active bowel sounds. Extremities: No C/E/C   Discharge Instructions  Discharge Orders   Future Orders Complete By Expires     Activity as tolerated - No restrictions  As directed     Call MD for:  severe uncontrolled pain  As directed     Call MD for:  temperature >100.4  As directed     Call MD for:  As directed     Scheduling Instructions:      Worsening  shortness of breath.    Diet general  As directed     Discharge instructions  As directed     Comments:      You will need your PT/INR checked frequently until your coumadin levels are therapeutic.  Be sure and call your PCP on Monday to arrange for a PT/INR check.        Medication List    TAKE these medications       albuterol 108 (90 BASE) MCG/ACT inhaler  Commonly known as:  PROVENTIL HFA;VENTOLIN HFA  Inhale 2 puffs into the lungs every 6 (six) hours as needed for wheezing or shortness of breath.     CYANOCOBALAMIN IJ  Inject 1 application as directed every 30 (thirty) days. Around the 22nd or 23rd of each month     enoxaparin 60 MG/0.6ML injection  Commonly known as:  LOVENOX  Inject 0.6 mLs (60 mg total) into the skin every 12 (twelve) hours.     levofloxacin 500 MG tablet  Commonly known as:  LEVAQUIN  Take 1 tablet (500 mg total) by mouth daily.     naproxen sodium 220 MG tablet  Commonly known as:  ANAPROX  Take 220 mg by mouth 2 (two) times daily with a meal.     omeprazole 20 MG capsule  Commonly known as:  PRILOSEC  Take 1 capsule (20 mg total) by mouth 2 (two) times daily.     temazepam 30 MG capsule  Commonly known as:  RESTORIL  Take 1 capsule (30 mg total) by mouth at bedtime as needed for sleep.     warfarin 5 MG tablet  Commonly known as:  COUMADIN  Take 1 tablet (5 mg total) by mouth daily.           Follow-up Information   Follow up with FRY,STEPHEN A, MD In 2 days. (For PT/INR check and further dosage instructions regarding coumadin.)    Contact information:   [address] Christena Flake [facility] [place] [address] [phone]       Follow up with LEE, CARRIE B, MD. Call in 2 days. (To arrange for hospital follow up.)    Contact information:   [address] [place] [address] [phone]        The results of significant diagnostics from this hospitalization (including imaging, microbiology, ancillary and laboratory) are listed below  for reference.    Significant Diagnostic Studies: Dg Chest 2 View  01/22/2013  *RADIOLOGY REPORT*  Clinical Data: Recent flu. Persistent cough.  Left-sided rib and neck pain. History of lung cancer, status post chemotherapy and radiation therapy.  CHEST - 2 VIEW  Comparison: CT 05/27/2012.Plain films 05/26/2012.  Findings: A left-sided Port-A-Cath which terminates at the mid SVC. Volume loss in the right hemithorax with elevation of the right hemidiaphragm. Normal heart size.  No pleural effusion or pneumothorax.  Vague right midlung interstitial opacity on the frontal, likely corresponding to increased posterior density on the lateral. Clear left lung with interstitial thickening. Cholecystectomy clips.  IMPRESSION: Right-sided posterior interstitial opacity with concurrent volume loss.  This appearance is nonspecific.  It could represent a surgical or treatment effects.  A focus of infection or residual tumor cannot be entirely excluded.  Short-term radiographic follow- up versus contrast enhanced CT should be considered.   Original Report Authenticated By: Jeronimo Greaves, M.D.    Ct Angio Chest Pe W/cm &/or Wo Cm  01/22/2013  *RADIOLOGY REPORT*  Clinical Data: Shortness of breath, elevated D-dimer.  Lung cancer with prior chemotherapy and radiation.  CT ANGIOGRAPHY CHEST  Technique:  Multidetector CT imaging of the chest using the standard protocol during bolus administration of intravenous contrast. Multiplanar reconstructed images including MIPs were obtained and reviewed to evaluate the vascular anatomy.  Contrast: OMNIPAQUE IOHEXOL 350 MG/ML SOLN  Comparison: CT 05/27/2012  Findings: Pulmonary emboli are noted bilaterally.  This is more pronounced on the left with involvement of the lingular branches and left lower lobe branches.  Right posterior lower lobe branch involvement also noted.  There are small bilateral pleural effusions.  Patchy right lung airspace disease present.  The previously seen  mass in the right middle lobe has improved significantly since prior study, measuring approximately 10 x 8 mm on today's study compared to 3.6 x 2.7 cm previously.  Airspace disease in the right upper lobe and right lower lobe could be related to pneumonia or post radiation changes. Dependent atelectasis in the lung bases.  Improving mediastinal adenopathy.  Right paratracheal node much smaller and difficult to measure on today's study.  Heart is borderline in size.  Aorta is normal caliber.  Left Port-A- Cath is in place.  Imaging into the upper abdomen shows no acute findings.  Mild degenerative changes in the mid to lower thoracic spine.  No acute bony abnormality.  IMPRESSION: Bilateral pulmonary emboli, slightly more pronounced on the left involving both the left upper lobe and lower lobe as well as right lower lobe.  Airspace disease throughout the right lung.  Given the patients history of radiation, this could represent radiation changes. Infection is not excluded.  Improving right lung mass with measurements as above.  Small bilateral effusions.  These results were discussed with Dr. Effie Shy at the time of interpretation.   Original Report Authenticated By: Charlett Nose, M.D.    Labs:  Basic Metabolic Panel:  Recent Labs Lab 01/22/13 1125 01/23/13 0404 01/24/13 0405  NA 136 138 138  K 3.4* 3.6 3.6  CL 101 105 106  CO2 21 23 24   GLUCOSE 100* 87 107*  BUN 9 6 5*  CREATININE 0.65 0.63 0.64  CALCIUM 9.5 8.9 9.3   GFR Estimated Creatinine Clearance: 61.4 ml/min (by C-G formula based on Cr of 0.64). Liver Function Tests:  Recent Labs Lab 01/22/13 1125  AST 14  ALT 8  ALKPHOS 55  BILITOT 0.6  PROT 6.8  ALBUMIN 3.5   Coagulation profile  Recent Labs Lab 01/22/13 1353 01/24/13 0405  INR 1.07 1.15    CBC:  Recent Labs Lab 01/22/13 1125 01/23/13 0404 01/24/13 0405  WBC 7.3 3.3* 2.8*  NEUTROABS 6.2  --   --   HGB 12.7 11.0* 11.8*  HCT 36.5 32.7* 35.3*  MCV 82.4 84.3  83.8  PLT 242 210 225   D-Dimer  Recent Labs  01/22/13 1125  DDIMER 11.06*   Microbiology Recent Results (from the past 240 hour(s))  URINE CULTURE     Status: None   Collection Time    01/22/13 11:00 AM  Result Value Range Status   Specimen Description URINE, CLEAN CATCH   Final   Special Requests NONE   Final   Culture  Setup Time 01/22/2013 19:27   Final   Colony Count >=100,000 COLONIES/ML   Final   Culture ENTEROCOCCUS SPECIES   Final   Report Status PENDING   Incomplete  CULTURE, BLOOD (ROUTINE X 2)     Status: None   Collection Time    01/22/13 11:25 AM      Result Value Range Status   Specimen Description BLOOD RIGHT HAND   Final   Special Requests BOTTLES DRAWN AEROBIC AND ANAEROBIC   Final   Culture  Setup Time 01/22/2013 16:08   Final   Culture     Final   Value:        BLOOD CULTURE RECEIVED NO GROWTH TO DATE CULTURE WILL BE HELD FOR 5 DAYS BEFORE ISSUING A FINAL NEGATIVE REPORT   Report Status PENDING   Incomplete  CULTURE, BLOOD (ROUTINE X 2)     Status: None   Collection Time    01/22/13 11:30 AM      Result Value Range Status   Specimen Description BLOOD LEFT ANTECUBITAL   Final   Special Requests BOTTLES DRAWN AEROBIC AND ANAEROBIC   Final   Culture  Setup Time 01/22/2013 16:08   Final   Culture     Final   Value:        BLOOD CULTURE RECEIVED NO GROWTH TO DATE CULTURE WILL BE HELD FOR 5 DAYS BEFORE ISSUING A FINAL NEGATIVE REPORT   Report Status PENDING   Incomplete   Hypercoagulable Profile:    Ref. Range 01/22/2013 16:20  Homocysteine Latest Range: 4.0-15.4 umol/L 10.3    Ref. Range 01/22/2013 16:20  Anticardiolipin IgA Latest Range: <22 APL U/mL 3 (L)  Anticardiolipin IgG Latest Range: <23 GPL U/mL 5 (L)  Anticardiolipin IgM Latest Range: <11 MPL U/mL 0 (L)  PTT Lupus Anticoagulant Latest Range: 28.0-43.0 secs 38.8  PTTLA Confirmation Latest Range: <8.0 secs NOT APPL  PTTLA 4:1 Mix Latest Range: 28.0-43.0 secs NOT APPL  DRVVT  Latest Range: 36.1-47.0 secs 41.8  Drvvt confirmation Latest Range: <1.11 Ratio NOT APPL  dRVVT Incubated 1:1 Mix Latest Range: <42.9 secs NOT APPL  Lupus Anticoagulant Latest Range: NOT DETECTED  NOT DETECTED    Ref. Range 01/22/2013 16:20  Beta-2 Glyco I IgG Latest Range: <20 G Units 1  Beta-2-Glycoprotein I IgA No range found PENDING  Beta-2-Glycoprotein I IgM Latest Range: <20 M Units 3    Ref. Range 01/22/2013 16:20  AntiThromb III Func Latest Range: 75-120 % 76    Ref. Range 01/22/2013 16:20  PTT Lupus Anticoagulant Latest Range: 28.0-43.0 secs 38.8  DRVVT Latest Range: 36.1-47.0 secs 41.8  Drvvt confirmation Latest Range: <1.11 Ratio NOT APPL  dRVVT Incubated 1:1 Mix Latest Range: <42.9 secs NOT APPL    Ref. Range 01/22/2013 16:20  Protein C Activity Latest Range: 75-133 % 140 (H)  Protein C, Total Latest Range: 72-160 % 61 (L)    Ref. Range 01/22/2013 16:20  Protein S Activity Latest Range: 69-129 % 73  Protein S Ag, Total Latest Range: 60-150 % 80   Time coordinating discharge: 35 minutes.  Signed:  RAMA,CHRISTINA  Pager [phone] [facility] 01/24/2013, 8:08 AM

## 2013-01-24 NOTE — Progress Notes (Signed)
Patient discharged to home via wheelchair with family, discharge instructions reviewed with patient who verbalized understanding. New RX given to patient. 

## 2013-01-26 LAB — CARDIOLIPIN ANTIBODIES, IGG, IGM, IGA
Anticardiolipin IgA: 3 APL U/mL — ABNORMAL LOW (ref <22)
Anticardiolipin IgM: 0 MPL U/mL — ABNORMAL LOW (ref <11)

## 2013-01-26 LAB — FACTOR 5 LEIDEN

## 2013-01-26 LAB — BETA-2-GLYCOPROTEIN I ABS, IGG/M/A
Beta-2 Glyco I IgG: 1 G Units (ref <20)
Beta-2-Glycoprotein I IgA: 11 A Units (ref <20)

## 2013-01-28 LAB — CULTURE, BLOOD (ROUTINE X 2): Culture: NO GROWTH

## 2013-01-29 ENCOUNTER — Ambulatory Visit (INDEPENDENT_AMBULATORY_CARE_PROVIDER_SITE_OTHER): Payer: Medicare Other | Admitting: Family

## 2013-01-29 DIAGNOSIS — Z86711 Personal history of pulmonary embolism: Secondary | ICD-10-CM | POA: Insufficient documentation

## 2013-01-29 LAB — POCT INR: INR: 3.1

## 2013-01-29 NOTE — Patient Instructions (Addendum)
Take half tablet today. Continue 5mg  a day.   Anticoagulation Dose Instructions as of 01/29/2013     Glynis Smiles Tue Wed Thu Fri Sat   New Dose 5 mg 5 mg 5 mg 5 mg 5 mg 5 mg 5 mg    Description       Take half tablet today. Continue 5mg  a day.

## 2013-02-02 ENCOUNTER — Ambulatory Visit (INDEPENDENT_AMBULATORY_CARE_PROVIDER_SITE_OTHER): Payer: Medicare Other | Admitting: Family

## 2013-02-02 DIAGNOSIS — I2699 Other pulmonary embolism without acute cor pulmonale: Secondary | ICD-10-CM

## 2013-02-02 LAB — POCT INR: INR: 3.1

## 2013-02-02 NOTE — Patient Instructions (Addendum)
Monday and Wednesday only, take 1/2 tab. All other days, Continue 5mg  a day. Recheck on Thursday.  Anticoagulation Dose Instructions as of 02/02/2013     Glynis Smiles Tue Wed Thu Fri Sat   New Dose 5 mg 2.5 mg 5 mg 2.5 mg 5 mg 5 mg 5 mg    Description       Monday and Wednesday only, take 1/2 tab. All other days, Continue 5mg  a day. Recheck on Thursday.

## 2013-02-05 ENCOUNTER — Encounter: Payer: Medicare Other | Admitting: Family

## 2013-02-06 ENCOUNTER — Ambulatory Visit (INDEPENDENT_AMBULATORY_CARE_PROVIDER_SITE_OTHER): Payer: Medicare Other | Admitting: Family

## 2013-02-06 ENCOUNTER — Encounter: Payer: Medicare Other | Admitting: Family

## 2013-02-06 DIAGNOSIS — I2699 Other pulmonary embolism without acute cor pulmonale: Secondary | ICD-10-CM

## 2013-02-06 NOTE — Patient Instructions (Signed)
Monday and Wednesday only, take 1/2 tab. All other days, Continue 5mg  a day. Recheck on Thursday.   Anticoagulation Dose Instructions as of 02/06/2013     Glynis Smiles Tue Wed Thu Fri Sat   New Dose 5 mg 2.5 mg 5 mg 2.5 mg 5 mg 5 mg 5 mg    Description       Monday and Wednesday only, take 1/2 tab. All other days, Continue 5mg  a day. Recheck on Thursday.

## 2013-02-09 ENCOUNTER — Encounter: Payer: Medicare Other | Admitting: Family

## 2013-02-19 ENCOUNTER — Telehealth: Payer: Self-pay | Admitting: Family Medicine

## 2013-02-19 MED ORDER — POTASSIUM CHLORIDE ER 10 MEQ PO TBCR: 10.0000 meq | EXTENDED_RELEASE_TABLET | Freq: Every day | ORAL | Status: DC

## 2013-02-19 NOTE — Telephone Encounter (Signed)
Refill potassium

## 2013-02-24 ENCOUNTER — Telehealth: Payer: Self-pay | Admitting: Family Medicine

## 2013-02-24 NOTE — Telephone Encounter (Signed)
NO unfortunately Tylenol is still the only thing she can take

## 2013-02-24 NOTE — Telephone Encounter (Signed)
Call regarding: Xarelto; [name] said she was changed from Coumadin to Xarelto; says she was told not to take Advil or Ibuprofen with the Coumadin for her back pain; says Tylenol doesn't work as well; wants to know if she can take Advil with the Xarelto; please call back

## 2013-02-24 NOTE — Telephone Encounter (Signed)
I spoke with pt  

## 2013-03-02 ENCOUNTER — Ambulatory Visit: Payer: Medicare Other | Admitting: Family Medicine

## 2013-03-02 ENCOUNTER — Telehealth: Payer: Self-pay | Admitting: Family Medicine

## 2013-03-02 ENCOUNTER — Encounter (INDEPENDENT_AMBULATORY_CARE_PROVIDER_SITE_OTHER): Payer: Medicare Other

## 2013-03-02 DIAGNOSIS — Z86718 Personal history of other venous thrombosis and embolism: Secondary | ICD-10-CM

## 2013-03-02 DIAGNOSIS — M79609 Pain in unspecified limb: Secondary | ICD-10-CM

## 2013-03-02 NOTE — Telephone Encounter (Signed)
Pt called and stated that she has right leg pain since yesterday 03/01/13, no other symptom other than the pain. She was told to call the office and possibly get a order for a doppler.

## 2013-03-02 NOTE — Telephone Encounter (Signed)
We will set up a stat venous doppler

## 2013-03-02 NOTE — Telephone Encounter (Signed)
I spoke with pt  

## 2013-03-03 ENCOUNTER — Telehealth: Payer: Self-pay | Admitting: Family Medicine

## 2013-03-03 NOTE — Telephone Encounter (Signed)
Gave results 

## 2013-03-24 ENCOUNTER — Telehealth: Payer: Self-pay | Admitting: Family

## 2013-03-24 ENCOUNTER — Ambulatory Visit (INDEPENDENT_AMBULATORY_CARE_PROVIDER_SITE_OTHER): Payer: Self-pay | Admitting: Family

## 2013-03-24 DIAGNOSIS — I2699 Other pulmonary embolism without acute cor pulmonale: Secondary | ICD-10-CM

## 2013-03-24 NOTE — Telephone Encounter (Signed)
Needs an INR visit

## 2013-03-24 NOTE — Telephone Encounter (Signed)
Per pt and note from 02/24/13, pt no longer on coumadin, is now on xarelto

## 2013-04-24 ENCOUNTER — Other Ambulatory Visit: Payer: Self-pay | Admitting: Family Medicine

## 2013-05-12 ENCOUNTER — Telehealth: Payer: Self-pay | Admitting: Family

## 2013-05-12 NOTE — Telephone Encounter (Signed)
Please check and see if she is still on coumadin

## 2013-05-13 ENCOUNTER — Ambulatory Visit: Payer: Self-pay | Admitting: Family

## 2013-05-13 NOTE — Telephone Encounter (Signed)
Pt no longer on coumadin. She is now taking Xarelto

## 2013-08-25 ENCOUNTER — Ambulatory Visit (INDEPENDENT_AMBULATORY_CARE_PROVIDER_SITE_OTHER): Payer: Medicare Other | Admitting: Family Medicine

## 2013-08-25 ENCOUNTER — Encounter: Payer: Self-pay | Admitting: Family Medicine

## 2013-08-25 VITALS — BP 102/68 | HR 96 | Temp 98.2°F | Wt 142.0 lb

## 2013-08-25 DIAGNOSIS — J019 Acute sinusitis, unspecified: Secondary | ICD-10-CM

## 2013-08-25 DIAGNOSIS — E538 Deficiency of other specified B group vitamins: Secondary | ICD-10-CM

## 2013-08-25 MED ORDER — ALBUTEROL SULFATE HFA 108 (90 BASE) MCG/ACT IN AERS: 2.0000 | INHALATION_SPRAY | RESPIRATORY_TRACT | Status: DC | PRN

## 2013-08-25 MED ORDER — AZITHROMYCIN 250 MG PO TABS: ORAL_TABLET | ORAL | Status: DC

## 2013-08-25 MED ORDER — CYANOCOBALAMIN 1000 MCG/ML IJ SOLN
1000.0000 ug | Freq: Once | INTRAMUSCULAR | Status: AC
  Administered 2013-08-25: 1000 ug via INTRAMUSCULAR

## 2013-08-25 NOTE — Progress Notes (Signed)
  Subjective:    Patient ID: [name], female    DOB: 1945-12-03, 67 y.o.   MRN: [id number]  HPI Here for 2 days of PND and ST. No fever. Some non-productive coughing.    Review of Systems  Constitutional: Negative.   HENT: Positive for congestion, postnasal drip and sinus pressure.   Eyes: Negative.   Respiratory: Positive for cough.        Objective:   Physical Exam  Constitutional: She appears well-developed and well-nourished. No distress.  HENT:  Right Ear: External ear normal.  Left Ear: External ear normal.  Nose: Nose normal.  Mouth/Throat: Oropharynx is clear and moist.  Eyes: Conjunctivae are normal.  Pulmonary/Chest: Effort normal and breath sounds normal. No respiratory distress. She has no wheezes. She has no rales.  Lymphadenopathy:    She has no cervical adenopathy.          Assessment & Plan:  Use Delsym and Tylenol prn.

## 2013-09-21 ENCOUNTER — Telehealth: Payer: Self-pay | Admitting: Family Medicine

## 2013-09-21 MED ORDER — BUPROPION HCL ER (XL) 150 MG PO TB24: 150.0000 mg | ORAL_TABLET | Freq: Every day | ORAL | Status: DC

## 2013-09-21 NOTE — Telephone Encounter (Signed)
Her oncologist suggested this for depression

## 2014-01-11 ENCOUNTER — Telehealth: Payer: Self-pay | Admitting: Family Medicine

## 2014-01-11 MED ORDER — OSELTAMIVIR PHOSPHATE 75 MG PO CAPS: 75.0000 mg | ORAL_CAPSULE | Freq: Two times a day (BID) | ORAL | Status: DC

## 2014-01-11 NOTE — Telephone Encounter (Signed)
Patient Information:  Caller Name: Gwenlyn Perking  Phone: [phone]  Patient: [name], [name]  Gender: Female  DOB: [birth year]  Age: 69 Years  PCP: Alysia Penna [facility])  Office Follow Up:  Does the office need to follow up with this patient?: Yes  Instructions For The Office: Requesting Tamiflu  RN Note:  Spouse  is calling regarding [name].  c/o fever, body aches, cough.   3 Members in house have been diagnosed  With the Flu and have started Tamiflu.  Wife came down with symptoms today as well.  Requesting Rx for Tamiflu be called to [facility] on [address]. [phone].  Symptoms  Reason For Call & Symptoms: exposure to flu  Reviewed Health History In EMR: Yes  Reviewed Medications In EMR: Yes  Reviewed Allergies In EMR: Yes  Reviewed Surgeries / Procedures: Yes  Date of Onset of Symptoms: 01/11/2014  Guideline(s) Used:  Influenza Exposure  Influenza - Seasonal  Disposition Per Guideline:   Discuss with PCP and Callback by Nurse within 1 Hour  Reason For Disposition Reached:   HIGH RISK (e.g., age > 34 years, pregnant, HIV+, chronic medical condition) and flu symptoms  Advice Given:  N/A  Patient Will Follow Care Advice:  YES

## 2014-01-11 NOTE — Telephone Encounter (Signed)
Rx sent to pharmacy. Pt is aware.  

## 2014-01-11 NOTE — Telephone Encounter (Signed)
Call in Tamiflu 75 mg bid for 5 days

## 2014-01-18 ENCOUNTER — Encounter: Payer: Self-pay | Admitting: Family Medicine

## 2014-01-18 ENCOUNTER — Telehealth: Payer: Self-pay | Admitting: Family Medicine

## 2014-01-18 ENCOUNTER — Ambulatory Visit (INDEPENDENT_AMBULATORY_CARE_PROVIDER_SITE_OTHER): Payer: Medicare HMO | Admitting: Family Medicine

## 2014-01-18 VITALS — BP 106/78 | HR 112 | Temp 98.7°F | Ht 65.0 in | Wt 148.0 lb

## 2014-01-18 DIAGNOSIS — J209 Acute bronchitis, unspecified: Secondary | ICD-10-CM

## 2014-01-18 MED ORDER — AMOXICILLIN-POT CLAVULANATE 875-125 MG PO TABS: 1.0000 | ORAL_TABLET | Freq: Two times a day (BID) | ORAL | Status: DC

## 2014-01-18 MED ORDER — POTASSIUM CHLORIDE ER 10 MEQ PO TBCR: 10.0000 meq | EXTENDED_RELEASE_TABLET | Freq: Every day | ORAL | Status: DC

## 2014-01-18 MED ORDER — BENZONATATE 200 MG PO CAPS: 200.0000 mg | ORAL_CAPSULE | Freq: Two times a day (BID) | ORAL | Status: DC | PRN

## 2014-01-18 NOTE — Telephone Encounter (Signed)
Pt was seen today and needs new rx wellbutrin xl 300 mg sent to [facility]

## 2014-01-18 NOTE — Progress Notes (Signed)
   Subjective:    Patient ID: [name], female    DOB: Sep 20, 1946, 68 y.o.   MRN: [id number]  HPI Here for 2 weeks of chest tightness and coughing up yellow sputum. She had fever at first but not now. She took 5 days of Tamiflu.   Review of Systems  Constitutional: Negative.   HENT: Negative.   Eyes: Negative.   Respiratory: Positive for cough, chest tightness and wheezing.   Cardiovascular: Negative.        Objective:   Physical Exam  Constitutional: She appears well-developed and well-nourished.  HENT:  Right Ear: External ear normal.  Left Ear: External ear normal.  Nose: Nose normal.  Mouth/Throat: Oropharynx is clear and moist.  Eyes: Conjunctivae are normal.  Pulmonary/Chest: Effort normal. She has no rales.  Scattered rhonchi and wheezes   Lymphadenopathy:    She has no cervical adenopathy.          Assessment & Plan:  Add Delsym prn

## 2014-01-18 NOTE — Progress Notes (Signed)
Pre visit review using our clinic review tool, if applicable. No additional management support is needed unless otherwise documented below in the visit note. 

## 2014-01-19 MED ORDER — BUPROPION HCL ER (XL) 300 MG PO TB24: 300.0000 mg | ORAL_TABLET | Freq: Every day | ORAL | Status: DC

## 2014-01-19 NOTE — Telephone Encounter (Signed)
done

## 2014-04-21 ENCOUNTER — Telehealth: Payer: Self-pay | Admitting: Family Medicine

## 2014-04-21 NOTE — Telephone Encounter (Signed)
appt scheduled

## 2014-04-21 NOTE — Telephone Encounter (Signed)
Pt has started up w/ a cough approx 2 wks. Pt in remission w/ lung cancer (1 .5 yrs). Pt states she just doesn't feel right and would like dr fry to see her. Is it ok to schedule thurs?

## 2014-04-21 NOTE — Telephone Encounter (Signed)
Yes okay to schedule.

## 2014-04-22 ENCOUNTER — Ambulatory Visit: Payer: Medicare HMO | Admitting: Family Medicine

## 2014-04-23 ENCOUNTER — Other Ambulatory Visit: Payer: Medicare HMO

## 2014-04-23 ENCOUNTER — Encounter: Payer: Self-pay | Admitting: Family Medicine

## 2014-04-23 ENCOUNTER — Ambulatory Visit
Admission: RE | Admit: 2014-04-23 | Discharge: 2014-04-23 | Disposition: A | Discharge status: Home / Self Care | Payer: Medicare HMO | Source: Ambulatory Visit | Attending: Family Medicine | Admitting: Family Medicine

## 2014-04-23 ENCOUNTER — Telehealth: Payer: Self-pay | Admitting: Family Medicine

## 2014-04-23 ENCOUNTER — Ambulatory Visit (INDEPENDENT_AMBULATORY_CARE_PROVIDER_SITE_OTHER): Payer: Medicare HMO | Admitting: Family Medicine

## 2014-04-23 ENCOUNTER — Ambulatory Visit (HOSPITAL_COMMUNITY)
Admission: RE | Admit: 2014-04-23 | Discharge: 2014-04-23 | Disposition: A | Discharge status: Home / Self Care | Payer: Medicare HMO | Source: Ambulatory Visit | Attending: Family Medicine | Admitting: Family Medicine

## 2014-04-23 VITALS — BP 104/74 | HR 111 | Temp 98.6°F | Ht 65.0 in | Wt 145.0 lb

## 2014-04-23 DIAGNOSIS — Z9221 Personal history of antineoplastic chemotherapy: Secondary | ICD-10-CM | POA: Insufficient documentation

## 2014-04-23 DIAGNOSIS — R079 Chest pain, unspecified: Secondary | ICD-10-CM

## 2014-04-23 DIAGNOSIS — I2699 Other pulmonary embolism without acute cor pulmonale: Secondary | ICD-10-CM

## 2014-04-23 DIAGNOSIS — R0602 Shortness of breath: Secondary | ICD-10-CM | POA: Insufficient documentation

## 2014-04-23 DIAGNOSIS — C3490 Malignant neoplasm of unspecified part of unspecified bronchus or lung: Secondary | ICD-10-CM

## 2014-04-23 DIAGNOSIS — R0789 Other chest pain: Secondary | ICD-10-CM | POA: Insufficient documentation

## 2014-04-23 DIAGNOSIS — Z923 Personal history of irradiation: Secondary | ICD-10-CM | POA: Insufficient documentation

## 2014-04-23 LAB — CBC WITH DIFFERENTIAL/PLATELET
BASOS ABS: 0 10*3/uL (ref 0.0–0.1)
BASOS PCT: 0.2 % (ref 0.0–3.0)
EOS ABS: 0 10*3/uL (ref 0.0–0.7)
Eosinophils Relative: 0.5 % (ref 0.0–5.0)
HEMATOCRIT: 40.2 % (ref 36.0–46.0)
HEMOGLOBIN: 13.8 g/dL (ref 12.0–15.0)
LYMPHS ABS: 0.6 10*3/uL — AB (ref 0.7–4.0)
LYMPHS PCT: 8.8 % — AB (ref 12.0–46.0)
MCHC: 34.4 g/dL (ref 30.0–36.0)
MCV: 86.1 fl (ref 78.0–100.0)
Monocytes Absolute: 0.5 10*3/uL (ref 0.1–1.0)
Monocytes Relative: 7.3 % (ref 3.0–12.0)
NEUTROS ABS: 5.5 10*3/uL (ref 1.4–7.7)
Neutrophils Relative %: 83.2 % — ABNORMAL HIGH (ref 43.0–77.0)
Platelets: 222 10*3/uL (ref 150.0–400.0)
RBC: 4.67 Mil/uL (ref 3.87–5.11)
RDW: 13.4 % (ref 11.5–15.5)
WBC: 6.6 10*3/uL (ref 4.0–10.5)

## 2014-04-23 LAB — BASIC METABOLIC PANEL
BUN: 13 mg/dL (ref 6–23)
CHLORIDE: 103 meq/L (ref 96–112)
CO2: 28 meq/L (ref 19–32)
Calcium: 9.8 mg/dL (ref 8.4–10.5)
Creatinine, Ser: 1 mg/dL (ref 0.4–1.2)
GFR: 60.49 mL/min (ref >60.00)
GLUCOSE: 97 mg/dL (ref 70–99)
POTASSIUM: 4.6 meq/L (ref 3.5–5.1)
SODIUM: 139 meq/L (ref 135–145)

## 2014-04-23 MED ORDER — IOHEXOL 350 MG/ML SOLN
100.0000 mL | Freq: Once | INTRAVENOUS | Status: AC | PRN
  Administered 2014-04-23: 100 mL via INTRAVENOUS

## 2014-04-23 MED ORDER — AMOXICILLIN-POT CLAVULANATE 875-125 MG PO TABS: 1.0000 | ORAL_TABLET | Freq: Two times a day (BID) | ORAL | Status: DC

## 2014-04-23 NOTE — Addendum Note (Signed)
Addended by: Aggie Hacker A on: 04/23/2014 04:06 PM   Modules accepted: Orders

## 2014-04-23 NOTE — Progress Notes (Signed)
   Subjective:    Patient ID: [name], female    DOB: 1946-10-15, 68 y.o.   MRN: [id number]  HPI Here for 3 weeks of feeling very fatigued, having some SOB, and having a dry cough. Now for the past few days she has had some dull pains in the right chest and right upper back. She has a hx of PEs, even despite being on Coumadin at the time. She is now on Xarelto. No ST or fever.    Review of Systems  Constitutional: Positive for fatigue. Negative for fever and chills.  HENT: Negative.   Eyes: Negative.   Respiratory: Positive for cough and shortness of breath.   Cardiovascular: Positive for chest pain. Negative for palpitations and leg swelling.       Objective:   Physical Exam  Constitutional: She appears well-developed and well-nourished. No distress.  Neck: Neck supple. No thyromegaly present.  Cardiovascular: Normal rate, regular rhythm, normal heart sounds and intact distal pulses.   Pulmonary/Chest: Effort normal and breath sounds normal. No respiratory distress. She has no wheezes. She has no rales. She exhibits no tenderness.  Lymphadenopathy:    She has no cervical adenopathy.          Assessment & Plan:  With her hx of PEs. I am very concerned that she may have another one given this set of symptoms. We will set up a stat chest CT angiogram today.

## 2014-04-23 NOTE — Telephone Encounter (Signed)
Pt is having a ct scan and labs done today, pt states home number is not working and wants to make sure when the results are in that she is contacted at [phone].

## 2014-04-23 NOTE — Telephone Encounter (Signed)
I called pt with results from scan and sent script e-scribe.

## 2014-04-23 NOTE — Progress Notes (Signed)
Pre visit review using our clinic review tool, if applicable. No additional management support is needed unless otherwise documented below in the visit note. 

## 2014-04-23 NOTE — Telephone Encounter (Signed)
I made a note of this.

## 2014-08-03 ENCOUNTER — Encounter: Payer: Self-pay | Admitting: Internal Medicine

## 2014-08-03 ENCOUNTER — Ambulatory Visit (INDEPENDENT_AMBULATORY_CARE_PROVIDER_SITE_OTHER): Payer: Medicare HMO | Admitting: Internal Medicine

## 2014-08-03 VITALS — BP 110/70 | HR 96 | Temp 97.9°F | Resp 20 | Ht 65.0 in | Wt 144.0 lb

## 2014-08-03 DIAGNOSIS — I82409 Acute embolism and thrombosis of unspecified deep veins of unspecified lower extremity: Secondary | ICD-10-CM

## 2014-08-03 DIAGNOSIS — I10 Essential (primary) hypertension: Secondary | ICD-10-CM

## 2014-08-03 DIAGNOSIS — T148XXA Other injury of unspecified body region, initial encounter: Secondary | ICD-10-CM

## 2014-08-03 NOTE — Progress Notes (Signed)
Subjective:    Patient ID: [name], female    DOB: Oct 16, 1946, 68 y.o.   MRN: [id number]  HPI 68 year old patient who has a history of hypertension.  She also has a history of recurrent DVT and pulmonary emboli.  She has been on chronic anticoagulation and over the past year Xarelto. For the past couple of days, she has noted some bruising and mild tenderness involving the right popliteal area.  She has had some mild bruising associated with the her anticoagulation.  Due to the soft tissue swelling.  She was concerned about a possible recurrent DVT  Past Medical History  Diagnosis Date  . DVT (deep venous thrombosis)     had 2 in right calf & 1 in the left calf  . Cataract   . Clotting disorder   . Anemia   . Hypertension   . Peripheral vascular disease   . Fibromyalgia   . Squamous cell lung cancer 2013    sees Dr. Elmon Kirschner at [facility] and Dr. Jaye Beagle at [place]   . Hyperlipidemia   . Tobacco abuse   . Community acquired pneumonia 01/22/2013  . PONV (postoperative nausea and vomiting)     History   Social History  . Marital Status: Married    Spouse Name: Gwenlyn Perking    Number of Children: 3  . Years of Education: N/A   Occupational History  . Retired Surveyor, minerals.    Social History Main Topics  . Smoking status: Former Smoker    Quit date: 05/23/2012  . Smokeless tobacco: Never Used  . Alcohol Use: No  . Drug Use: No  . Sexual Activity: No   Other Topics Concern  . Not on file   Social History Narrative   Married.  Lives with husband in [place].  Independent of ADLs.    Past Surgical History  Procedure Laterality Date  . Appendectomy    . Cholecystectomy    . Tonsillectomy    . Knee arthroscopy      right  . Laminectomy      lumbar  . Colonoscopy      11/28/09 repeat in 10 years  . Video bronchoscopy  05/30/2012    Procedure: VIDEO BRONCHOSCOPY WITH FLUORO;  Surgeon: Elsie Stain, MD;  Location: [place];  Service:  Cardiopulmonary;  Laterality: N/A;  . Esophagogastroduodenoscopy  October 2013    severe esophagitis, done at [facility]   . Portacath placement      Family History  Problem Relation Age of Onset  . Anemia      FE deficiency   . Arthritis      family history  . Cancer      colon 1st degree relative <60  . Hypertension Mother     family history  . Multiple sclerosis Sister   . Atrial fibrillation Mother   . Cancer Father     NHL  . Cancer Mother     Lung    Allergies  Allergen Reactions  . Codeine Other (See Comments)    Extreme sleepiness  . Morphine Other (See Comments)    Extreme sleepiness  . Zofran [Ondansetron Hcl]     Patient states she should not take Zofran due to her heart.    Current Outpatient Prescriptions on File Prior to Visit  Medication Sig Dispense Refill  . albuterol (PROVENTIL HFA;VENTOLIN HFA) 108 (90 BASE) MCG/ACT inhaler Inhale 2 puffs into the lungs every 4 (four) hours as needed for wheezing  or shortness of breath.  1 Inhaler  11  . buPROPion (WELLBUTRIN XL) 300 MG 24 hr tablet Take 1 tablet (300 mg total) by mouth daily.  90 tablet  3  . CYANOCOBALAMIN IJ Inject 1 application as directed every 30 (thirty) days. Around the 22nd or 23rd of each month      . omeprazole (PRILOSEC) 20 MG capsule Take 20 mg by mouth 2 (two) times daily as needed.      . potassium chloride (KLOR-CON 10) 10 MEQ tablet Take 1 tablet (10 mEq total) by mouth daily.  90 tablet  3  . Rivaroxaban (XARELTO PO) Take 20 mg by mouth daily.        No current facility-administered medications on file prior to visit.    BP 110/70  Pulse 96  Temp(Src) 97.9 F (36.6 C) (Oral)  Resp 20  Ht 5\' 5"  (1.651 m)  Wt 144 lb (65.318 kg)  BMI 23.96 kg/m2  SpO2 97%    Review of Systems  Constitutional: Negative.   HENT: Negative for congestion, dental problem, hearing loss, rhinorrhea, sinus pressure, sore throat and tinnitus.   Eyes: Negative for pain, discharge and visual disturbance.   Respiratory: Negative for cough and shortness of breath.   Cardiovascular: Negative for chest pain, palpitations and leg swelling.  Gastrointestinal: Negative for nausea, vomiting, abdominal pain, diarrhea, constipation, blood in stool and abdominal distention.  Genitourinary: Negative for dysuria, urgency, frequency, hematuria, flank pain, vaginal bleeding, vaginal discharge, difficulty urinating, vaginal pain and pelvic pain.  Musculoskeletal: Negative for arthralgias, gait problem and joint swelling.  Skin: Negative for rash.       Ecchymosis and soft tissue swelling, right popliteal area  Neurological: Negative for dizziness, syncope, speech difficulty, weakness, numbness and headaches.  Hematological: Negative for adenopathy.  Psychiatric/Behavioral: Negative for behavioral problems, dysphoric mood and agitation. The patient is not nervous/anxious.        Objective:   Physical Exam  Constitutional: She appears well-nourished. No distress.  Skin:  3 cm mildly tender hematoma with ecchymoses right lateral popliteal area          Assessment & Plan:   Small hematoma, right popliteal area with ecchymosis.  Patient reassured.  Continue Zaroxolyn.  She does have an appointment next week at [facility] for hematology oncology Hypertension stable

## 2014-08-03 NOTE — Patient Instructions (Signed)
Call or return to clinic prn if these symptoms worsen or fail to improve as anticipated.  Followup at [facility] hematology oncology next week as scheduled

## 2014-08-03 NOTE — Progress Notes (Signed)
Pre visit review using our clinic review tool, if applicable. No additional management support is needed unless otherwise documented below in the visit note. 

## 2014-11-05 ENCOUNTER — Encounter: Payer: Self-pay | Admitting: Family Medicine

## 2014-11-05 ENCOUNTER — Ambulatory Visit (INDEPENDENT_AMBULATORY_CARE_PROVIDER_SITE_OTHER): Payer: Medicare HMO | Admitting: Family Medicine

## 2014-11-05 VITALS — BP 95/75 | HR 102 | Temp 98.4°F | Ht 65.0 in | Wt 146.0 lb

## 2014-11-05 DIAGNOSIS — I10 Essential (primary) hypertension: Secondary | ICD-10-CM

## 2014-11-05 DIAGNOSIS — E538 Deficiency of other specified B group vitamins: Secondary | ICD-10-CM

## 2014-11-05 DIAGNOSIS — J209 Acute bronchitis, unspecified: Secondary | ICD-10-CM

## 2014-11-05 LAB — CBC WITH DIFFERENTIAL/PLATELET
BASOS PCT: 0.4 % (ref 0.0–3.0)
Basophils Absolute: 0 10*3/uL (ref 0.0–0.1)
EOS PCT: 0.6 % (ref 0.0–5.0)
Eosinophils Absolute: 0.1 10*3/uL (ref 0.0–0.7)
HCT: 40.3 % (ref 36.0–46.0)
Hemoglobin: 13.4 g/dL (ref 12.0–15.0)
Lymphocytes Relative: 13.8 % (ref 12.0–46.0)
Lymphs Abs: 1.3 10*3/uL (ref 0.7–4.0)
MCHC: 33.2 g/dL (ref 30.0–36.0)
MCV: 87.6 fl (ref 78.0–100.0)
MONOS PCT: 8.9 % (ref 3.0–12.0)
Monocytes Absolute: 0.8 10*3/uL (ref 0.1–1.0)
NEUTROS PCT: 76.3 % (ref 43.0–77.0)
Neutro Abs: 6.9 10*3/uL (ref 1.4–7.7)
PLATELETS: 232 10*3/uL (ref 150.0–400.0)
RBC: 4.6 Mil/uL (ref 3.87–5.11)
RDW: 13.8 % (ref 11.5–15.5)
WBC: 9.1 10*3/uL (ref 4.0–10.5)

## 2014-11-05 LAB — BASIC METABOLIC PANEL
BUN: 11 mg/dL (ref 6–23)
CALCIUM: 9.5 mg/dL (ref 8.4–10.5)
CHLORIDE: 102 meq/L (ref 96–112)
CO2: 25 meq/L (ref 19–32)
CREATININE: 0.8 mg/dL (ref 0.4–1.2)
GFR: 74.36 mL/min (ref >60.00)
Glucose, Bld: 96 mg/dL (ref 70–99)
Potassium: 3.9 mEq/L (ref 3.5–5.1)
Sodium: 134 mEq/L — ABNORMAL LOW (ref 135–145)

## 2014-11-05 LAB — HEPATIC FUNCTION PANEL
ALT: 73 U/L — AB (ref 0–35)
AST: 81 U/L — ABNORMAL HIGH (ref 0–37)
Albumin: 3.9 g/dL (ref 3.5–5.2)
Alkaline Phosphatase: 73 U/L (ref 39–117)
BILIRUBIN DIRECT: 0.1 mg/dL (ref 0.0–0.3)
BILIRUBIN TOTAL: 0.6 mg/dL (ref 0.2–1.2)
TOTAL PROTEIN: 7.6 g/dL (ref 6.0–8.3)

## 2014-11-05 LAB — VITAMIN B12: VITAMIN B 12: 239 pg/mL (ref 211–911)

## 2014-11-05 LAB — TSH: TSH: 1.37 u[IU]/mL (ref 0.35–4.50)

## 2014-11-05 MED ORDER — CYANOCOBALAMIN 1000 MCG/ML IJ SOLN
1000.0000 ug | Freq: Once | INTRAMUSCULAR | Status: AC
  Administered 2014-11-05: 1000 ug via INTRAMUSCULAR

## 2014-11-05 MED ORDER — AZITHROMYCIN 250 MG PO TABS: ORAL_TABLET | ORAL | Status: DC

## 2014-11-05 NOTE — Addendum Note (Signed)
Addended by: Miles Costain T on: 11/05/2014 02:53 PM   Modules accepted: Orders

## 2014-11-05 NOTE — Progress Notes (Signed)
Pre visit review using our clinic review tool, if applicable. No additional management support is needed unless otherwise documented below in the visit note. 

## 2014-11-05 NOTE — Progress Notes (Signed)
   Subjective:    Patient ID: [name], female    DOB: 1946-09-04, 68 y.o.   MRN: [id number]  HPI Here for one week of chest congestion and coughing up green sputum. No fever. She has not had a B12 shot for many months. She describes a feeling of shaking or "quivering" inside her body for the past few weeks.    Review of Systems  Constitutional: Negative.   HENT: Positive for congestion. Negative for postnasal drip and sinus pressure.   Respiratory: Positive for cough and chest tightness.   Cardiovascular: Negative.        Objective:   Physical Exam  Constitutional: She appears well-developed and well-nourished.  HENT:  Right Ear: External ear normal.  Left Ear: External ear normal.  Nose: Nose normal.  Mouth/Throat: Oropharynx is clear and moist.  Eyes: Conjunctivae are normal.  Pulmonary/Chest: Effort normal. She has no wheezes. She has no rales.  Scattered rhonchi   Lymphadenopathy:    She has no cervical adenopathy.          Assessment & Plan:  Treat with a Zpack. Get labs. I suspect her B12 has dropped low again so we will resume shots today.

## 2014-11-08 ENCOUNTER — Telehealth: Payer: Self-pay | Admitting: Family Medicine

## 2014-11-08 NOTE — Telephone Encounter (Signed)
emmi mailed  °

## 2014-11-22 ENCOUNTER — Ambulatory Visit (INDEPENDENT_AMBULATORY_CARE_PROVIDER_SITE_OTHER): Payer: Medicare HMO | Admitting: Nurse Practitioner

## 2014-11-22 ENCOUNTER — Encounter: Payer: Self-pay | Admitting: Nurse Practitioner

## 2014-11-22 VITALS — BP 127/87 | HR 92 | Temp 97.8°F | Ht 65.0 in | Wt 149.0 lb

## 2014-11-22 DIAGNOSIS — R05 Cough: Secondary | ICD-10-CM

## 2014-11-22 DIAGNOSIS — E538 Deficiency of other specified B group vitamins: Secondary | ICD-10-CM

## 2014-11-22 DIAGNOSIS — R059 Cough, unspecified: Secondary | ICD-10-CM

## 2014-11-22 MED ORDER — CYANOCOBALAMIN 1000 MCG/ML IJ SOLN
1000.0000 ug | Freq: Once | INTRAMUSCULAR | Status: AC
  Administered 2014-11-22: 1000 ug via INTRAMUSCULAR

## 2014-11-22 MED ORDER — BENZONATATE 200 MG PO CAPS: 200.0000 mg | ORAL_CAPSULE | Freq: Three times a day (TID) | ORAL | Status: DC | PRN

## 2014-11-22 MED ORDER — PREDNISONE 5 MG PO TABS: ORAL_TABLET | ORAL | Status: DC

## 2014-11-22 MED ORDER — METHYLPREDNISOLONE ACETATE 40 MG/ML IJ SUSP
40.0000 mg | Freq: Once | INTRAMUSCULAR | Status: AC
  Administered 2014-11-22: 40 mg via INTRAMUSCULAR

## 2014-11-22 NOTE — Patient Instructions (Signed)
You have a viral illness that has caused bronchitis. You may cough for 3-4 weeks. Start prednisone tomorrow. For cough, you may use benzonatate capsules as needed for cough. Please call for re-evaluation if you are not improving or develop chest pain with inspiration or fever.  Acute Bronchitis Bronchitis is inflammation of the airways that extend from the windpipe into the lungs (bronchi). The inflammation often causes mucus to develop. This leads to a cough, which is the most common symptom of bronchitis.  In acute bronchitis, the condition usually develops suddenly and goes away over time, usually in a couple weeks. Smoking, allergies, and asthma can make bronchitis worse. Repeated episodes of bronchitis may cause further lung problems.  CAUSES Acute bronchitis is most often caused by the same virus that causes a cold. The virus can spread from person to person (contagious).  SIGNS AND SYMPTOMS   Cough.   Fever.   Coughing up mucus.   Body aches.   Chest congestion.   Chills.   Shortness of breath.   Sore throat.  DIAGNOSIS  Acute bronchitis is usually diagnosed through a physical exam. Tests, such as chest X-rays, are sometimes done to rule out other conditions.  TREATMENT  Acute bronchitis usually goes away in a couple weeks. Often times, no medical treatment is necessary. Medicines are sometimes given for relief of fever or cough. Antibiotics are usually not needed but may be prescribed in certain situations. In some cases, an inhaler may be recommended to help reduce shortness of breath and control the cough. A cool mist vaporizer may also be used to help thin bronchial secretions and make it easier to clear the chest.  HOME CARE INSTRUCTIONS  Get plenty of rest.   Drink enough fluids to keep your urine clear or pale yellow (unless you have a medical condition that requires fluid restriction). Increasing fluids may help thin your secretions and will prevent  dehydration.   Only take over-the-counter or prescription medicines as directed by your health care provider.   Avoid smoking and secondhand smoke. Exposure to cigarette smoke or irritating chemicals will make bronchitis worse. If you are a smoker, consider using nicotine gum or skin patches to help control withdrawal symptoms. Quitting smoking will help your lungs heal faster.   Reduce the chances of another bout of acute bronchitis by washing your hands frequently, avoiding people with cold symptoms, and trying not to touch your hands to your mouth, nose, or eyes.   Follow up with your health care provider as directed.  SEEK MEDICAL CARE IF: Your symptoms do not improve after 1 week of treatment.  SEEK IMMEDIATE MEDICAL CARE IF:  You develop an increased fever or chills.   You have chest pain.   You have severe shortness of breath.  You have bloody sputum.   You develop dehydration.  You develop fainting.  You develop repeated vomiting.  You develop a severe headache. MAKE SURE YOU:   Understand these instructions.  Will watch your condition.  Will get help right away if you are not doing well or get worse. Document Released: 12/20/2004 Document Revised: 07/15/2013 Document Reviewed: 05/05/2013 [facility] Patient Information 2014 [place].

## 2014-11-22 NOTE — Progress Notes (Signed)
Pre visit review using our clinic review tool, if applicable. No additional management support is needed unless otherwise documented below in the visit note. 

## 2014-11-22 NOTE — Progress Notes (Signed)
   Subjective:    Patient ID: [name], female    DOB: [birth year], 68 y.o.   MRN: [id number]  HPI Comments: [name] is accompanied by her husband today.  Cough This is a new problem. The current episode started 1 to 4 weeks ago (2 weeks). The problem has been unchanged. The cough is non-productive. Associated symptoms include a fever (resolved over 1 week ago) and a sore throat. Pertinent negatives include no chest pain, chills, ear pain, headaches, nasal congestion, postnasal drip, shortness of breath or wheezing. Nothing aggravates the symptoms. Treatments tried: completed azithromycin about 1 week ago. The treatment provided no relief. Her past medical history is significant for pneumonia. non-small cell lung CA, PE      Review of Systems  Constitutional: Positive for fever (resolved over 1 week ago). Negative for chills.  HENT: Positive for sore throat. Negative for congestion, ear pain, postnasal drip, sinus pressure and voice change.   Respiratory: Positive for cough. Negative for chest tightness, shortness of breath and wheezing.   Cardiovascular: Negative for chest pain.  Musculoskeletal: Negative for back pain.  Neurological: Negative for headaches.       Objective:   Physical Exam  Constitutional: She is oriented to person, place, and time. She appears well-developed and well-nourished. No distress.  HENT:  Head: Normocephalic and atraumatic.  Right Ear: External ear normal.  Left Ear: External ear normal.  Mouth/Throat: Oropharynx is clear and moist. No oropharyngeal exudate.  Eyes: Conjunctivae are normal. Right eye exhibits no discharge. Left eye exhibits no discharge.  Neck: Normal range of motion. Neck supple. No thyromegaly present.  Cardiovascular: Normal rate, regular rhythm and normal heart sounds.   No murmur heard. Pulmonary/Chest: Effort normal and breath sounds normal. No respiratory distress. She has no wheezes. She has no rales.    Lymphadenopathy:    She has no cervical adenopathy.  Neurological: She is alert and oriented to person, place, and time.  Skin: Skin is warm and dry.  Psychiatric: She has a normal mood and affect. Her behavior is normal. Thought content normal.  Vitals reviewed.         Assessment & Plan:  1. Cough likeli viral etiology - methylPREDNISolone acetate (DEPO-MEDROL) injection 40 mg; Inject 1 mL (40 mg total) into the muscle once. - benzonatate (TESSALON) 200 MG capsule; Take 1 capsule (200 mg total) by mouth 3 (three) times daily as needed for cough.  Dispense: 60 capsule; Refill: 0 - predniSONE (DELTASONE) 5 MG tablet; Starting tomorrow, take 4T po qam X 3d, decrease by 1 T every 3 days until taking 1T po qam for 3 days, then d/c  Dispense: 30 tablet; Refill: 0  2. Vitamin B 12 deficiency - cyanocobalamin ((VITAMIN B-12)) injection 1,000 mcg; Inject 1 mL (1,000 mcg total) into the muscle once.  See pt instructions. F/u PRN

## 2014-12-08 ENCOUNTER — Ambulatory Visit (INDEPENDENT_AMBULATORY_CARE_PROVIDER_SITE_OTHER): Payer: Commercial Managed Care - HMO | Admitting: Family Medicine

## 2014-12-08 DIAGNOSIS — E538 Deficiency of other specified B group vitamins: Secondary | ICD-10-CM

## 2014-12-08 DIAGNOSIS — Z23 Encounter for immunization: Secondary | ICD-10-CM | POA: Diagnosis not present

## 2014-12-08 MED ORDER — CYANOCOBALAMIN 1000 MCG/ML IJ SOLN
1000.0000 ug | Freq: Once | INTRAMUSCULAR | Status: AC
  Administered 2014-12-08: 1000 ug via INTRAMUSCULAR

## 2014-12-15 ENCOUNTER — Ambulatory Visit (INDEPENDENT_AMBULATORY_CARE_PROVIDER_SITE_OTHER): Payer: Commercial Managed Care - HMO | Admitting: Family Medicine

## 2014-12-15 DIAGNOSIS — E530 Riboflavin deficiency: Secondary | ICD-10-CM

## 2014-12-15 DIAGNOSIS — Z23 Encounter for immunization: Secondary | ICD-10-CM

## 2014-12-15 MED ORDER — CYANOCOBALAMIN 1000 MCG/ML IJ SOLN
1000.0000 ug | Freq: Once | INTRAMUSCULAR | Status: AC
  Administered 2014-12-15: 1000 ug via INTRAMUSCULAR

## 2014-12-22 ENCOUNTER — Ambulatory Visit (INDEPENDENT_AMBULATORY_CARE_PROVIDER_SITE_OTHER): Payer: Commercial Managed Care - HMO | Admitting: Family Medicine

## 2014-12-22 DIAGNOSIS — E538 Deficiency of other specified B group vitamins: Secondary | ICD-10-CM

## 2014-12-22 MED ORDER — CYANOCOBALAMIN 1000 MCG/ML IJ SOLN
1000.0000 ug | Freq: Once | INTRAMUSCULAR | Status: AC
  Administered 2014-12-22: 1000 ug via INTRAMUSCULAR

## 2014-12-29 ENCOUNTER — Ambulatory Visit: Payer: Commercial Managed Care - HMO | Admitting: Family Medicine

## 2014-12-31 DIAGNOSIS — Z6824 Body mass index (BMI) 24.0-24.9, adult: Secondary | ICD-10-CM | POA: Diagnosis not present

## 2014-12-31 DIAGNOSIS — N1330 Unspecified hydronephrosis: Secondary | ICD-10-CM | POA: Diagnosis not present

## 2014-12-31 DIAGNOSIS — Z08 Encounter for follow-up examination after completed treatment for malignant neoplasm: Secondary | ICD-10-CM | POA: Diagnosis not present

## 2014-12-31 DIAGNOSIS — Z9221 Personal history of antineoplastic chemotherapy: Secondary | ICD-10-CM | POA: Diagnosis not present

## 2014-12-31 DIAGNOSIS — C3491 Malignant neoplasm of unspecified part of right bronchus or lung: Secondary | ICD-10-CM | POA: Diagnosis not present

## 2014-12-31 DIAGNOSIS — Z85118 Personal history of other malignant neoplasm of bronchus and lung: Secondary | ICD-10-CM | POA: Diagnosis not present

## 2014-12-31 DIAGNOSIS — I700 Atherosclerosis of aorta: Secondary | ICD-10-CM | POA: Diagnosis not present

## 2014-12-31 DIAGNOSIS — C3490 Malignant neoplasm of unspecified part of unspecified bronchus or lung: Secondary | ICD-10-CM | POA: Diagnosis not present

## 2015-01-04 ENCOUNTER — Telehealth: Payer: Self-pay | Admitting: Family Medicine

## 2015-01-04 DIAGNOSIS — N135 Crossing vessel and stricture of ureter without hydronephrosis: Secondary | ICD-10-CM

## 2015-01-04 NOTE — Telephone Encounter (Signed)
Pt has cat scan every 6 months due to previous cancer. 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  Pt needs referral to  .[facility] Franchot Gallo, M. D  Pt has appt thurs 2/11 at 8am. Can you put in referral. ' [employer]

## 2015-01-05 ENCOUNTER — Ambulatory Visit (INDEPENDENT_AMBULATORY_CARE_PROVIDER_SITE_OTHER): Payer: Commercial Managed Care - HMO | Admitting: Family Medicine

## 2015-01-05 DIAGNOSIS — E538 Deficiency of other specified B group vitamins: Secondary | ICD-10-CM

## 2015-01-05 MED ORDER — CYANOCOBALAMIN 1000 MCG/ML IJ SOLN
1000.0000 ug | Freq: Once | INTRAMUSCULAR | Status: AC
  Administered 2015-01-05: 1000 ug via INTRAMUSCULAR

## 2015-01-06 DIAGNOSIS — N281 Cyst of kidney, acquired: Secondary | ICD-10-CM | POA: Diagnosis not present

## 2015-01-06 DIAGNOSIS — N1330 Unspecified hydronephrosis: Secondary | ICD-10-CM | POA: Diagnosis not present

## 2015-01-12 ENCOUNTER — Ambulatory Visit (INDEPENDENT_AMBULATORY_CARE_PROVIDER_SITE_OTHER): Payer: Commercial Managed Care - HMO | Admitting: Family Medicine

## 2015-01-12 DIAGNOSIS — E538 Deficiency of other specified B group vitamins: Secondary | ICD-10-CM

## 2015-01-12 MED ORDER — CYANOCOBALAMIN 1000 MCG/ML IJ SOLN
1000.0000 ug | Freq: Once | INTRAMUSCULAR | Status: AC
  Administered 2015-01-12: 1000 ug via INTRAMUSCULAR

## 2015-01-13 ENCOUNTER — Telehealth: Payer: Self-pay | Admitting: Family Medicine

## 2015-01-13 DIAGNOSIS — H538 Other visual disturbances: Secondary | ICD-10-CM

## 2015-01-13 NOTE — Telephone Encounter (Signed)
I spoke with pt  

## 2015-01-13 NOTE — Telephone Encounter (Signed)
She needs a referral to Dr. Darleen Crocker for blurry vision, this was done

## 2015-01-19 ENCOUNTER — Ambulatory Visit (INDEPENDENT_AMBULATORY_CARE_PROVIDER_SITE_OTHER): Payer: Commercial Managed Care - HMO | Admitting: Family Medicine

## 2015-01-19 DIAGNOSIS — E538 Deficiency of other specified B group vitamins: Secondary | ICD-10-CM

## 2015-01-19 MED ORDER — CYANOCOBALAMIN 1000 MCG/ML IJ SOLN
1000.0000 ug | Freq: Once | INTRAMUSCULAR | Status: AC
  Administered 2015-01-19: 1000 ug via INTRAMUSCULAR

## 2015-01-26 ENCOUNTER — Ambulatory Visit: Payer: Commercial Managed Care - HMO | Admitting: Family Medicine

## 2015-01-27 ENCOUNTER — Ambulatory Visit (INDEPENDENT_AMBULATORY_CARE_PROVIDER_SITE_OTHER): Payer: Commercial Managed Care - HMO | Admitting: Family Medicine

## 2015-01-27 DIAGNOSIS — E538 Deficiency of other specified B group vitamins: Secondary | ICD-10-CM

## 2015-01-27 MED ORDER — CYANOCOBALAMIN 1000 MCG/ML IJ SOLN
1000.0000 ug | Freq: Once | INTRAMUSCULAR | Status: AC
  Administered 2015-01-27: 1000 ug via INTRAMUSCULAR

## 2015-02-01 DIAGNOSIS — Z961 Presence of intraocular lens: Secondary | ICD-10-CM | POA: Diagnosis not present

## 2015-02-01 DIAGNOSIS — H18411 Arcus senilis, right eye: Secondary | ICD-10-CM | POA: Diagnosis not present

## 2015-02-01 DIAGNOSIS — H02839 Dermatochalasis of unspecified eye, unspecified eyelid: Secondary | ICD-10-CM | POA: Diagnosis not present

## 2015-02-01 DIAGNOSIS — H26491 Other secondary cataract, right eye: Secondary | ICD-10-CM | POA: Diagnosis not present

## 2015-02-01 DIAGNOSIS — H18412 Arcus senilis, left eye: Secondary | ICD-10-CM | POA: Diagnosis not present

## 2015-02-02 ENCOUNTER — Encounter: Payer: Self-pay | Admitting: Family Medicine

## 2015-02-02 ENCOUNTER — Ambulatory Visit (INDEPENDENT_AMBULATORY_CARE_PROVIDER_SITE_OTHER): Payer: Commercial Managed Care - HMO | Admitting: Family Medicine

## 2015-02-02 ENCOUNTER — Ambulatory Visit: Payer: Commercial Managed Care - HMO | Admitting: Family Medicine

## 2015-02-02 VITALS — BP 120/70 | HR 64 | Temp 98.2°F | Wt 151.2 lb

## 2015-02-02 DIAGNOSIS — E538 Deficiency of other specified B group vitamins: Secondary | ICD-10-CM

## 2015-02-02 DIAGNOSIS — R609 Edema, unspecified: Secondary | ICD-10-CM | POA: Diagnosis not present

## 2015-02-02 DIAGNOSIS — I10 Essential (primary) hypertension: Secondary | ICD-10-CM

## 2015-02-02 MED ORDER — ALBUTEROL SULFATE HFA 108 (90 BASE) MCG/ACT IN AERS: 2.0000 | INHALATION_SPRAY | RESPIRATORY_TRACT | Status: DC | PRN

## 2015-02-02 MED ORDER — CYANOCOBALAMIN 1000 MCG/ML IJ SOLN
1000.0000 ug | Freq: Once | INTRAMUSCULAR | Status: AC
  Administered 2015-02-02: 1000 ug via INTRAMUSCULAR

## 2015-02-02 MED ORDER — FUROSEMIDE 20 MG PO TABS: ORAL_TABLET | ORAL | Status: DC

## 2015-02-02 NOTE — Progress Notes (Signed)
Pre visit review using our clinic review tool, if applicable. No additional management support is needed unless otherwise documented below in the visit note. 

## 2015-02-02 NOTE — Progress Notes (Signed)
   Subjective:    Patient ID: [name], female    DOB: 02-Jan-1946, 69 y.o.   MRN: [id number]  HPI Here to describe what she feels in fluid retention in the hands, feet, and abdomen. No SOB. Her rings and shoes seem to fit more tightly than normal. She has noticed this over the past 4 weeks. No recent medication changes. Her renal function was normal when checked 3 months ago.    Review of Systems  Constitutional: Negative.   Respiratory: Negative.   Cardiovascular: Positive for leg swelling. Negative for chest pain and palpitations.       Objective:   Physical Exam  Constitutional: She appears well-developed and well-nourished.  Neck: No thyromegaly present.  Cardiovascular: Normal rate, regular rhythm, normal heart sounds and intact distal pulses.   Pulmonary/Chest: Effort normal and breath sounds normal. No respiratory distress. She has no wheezes. She has no rales.  Abdominal: Soft. Bowel sounds are normal. She exhibits no distension and no mass. There is no tenderness. There is no rebound and no guarding.  Musculoskeletal: She exhibits no edema.  Lymphadenopathy:    She has no cervical adenopathy.          Assessment & Plan:  At this point I can see no evidence of edema on exam, but we will try Lasix 20 mg to use on an as needed basis. Recheck prn

## 2015-02-09 ENCOUNTER — Ambulatory Visit (INDEPENDENT_AMBULATORY_CARE_PROVIDER_SITE_OTHER): Payer: Commercial Managed Care - HMO | Admitting: Family Medicine

## 2015-02-09 DIAGNOSIS — E538 Deficiency of other specified B group vitamins: Secondary | ICD-10-CM

## 2015-02-09 MED ORDER — CYANOCOBALAMIN 1000 MCG/ML IJ SOLN
1000.0000 ug | Freq: Once | INTRAMUSCULAR | Status: AC
Start: 2015-02-09 — End: 2015-02-09
  Administered 2015-02-09: 1000 ug via INTRAMUSCULAR

## 2015-02-16 ENCOUNTER — Ambulatory Visit (INDEPENDENT_AMBULATORY_CARE_PROVIDER_SITE_OTHER): Payer: Commercial Managed Care - HMO | Admitting: Family Medicine

## 2015-02-16 DIAGNOSIS — E538 Deficiency of other specified B group vitamins: Secondary | ICD-10-CM | POA: Diagnosis not present

## 2015-02-16 MED ORDER — CYANOCOBALAMIN 1000 MCG/ML IJ SOLN
1000.0000 ug | Freq: Once | INTRAMUSCULAR | Status: AC
  Administered 2015-02-16: 1000 ug via INTRAMUSCULAR

## 2015-02-23 ENCOUNTER — Ambulatory Visit: Payer: Commercial Managed Care - HMO | Admitting: Family Medicine

## 2015-03-02 ENCOUNTER — Ambulatory Visit (INDEPENDENT_AMBULATORY_CARE_PROVIDER_SITE_OTHER): Payer: Commercial Managed Care - HMO | Admitting: Family Medicine

## 2015-03-02 DIAGNOSIS — E538 Deficiency of other specified B group vitamins: Secondary | ICD-10-CM

## 2015-03-02 MED ORDER — CYANOCOBALAMIN 1000 MCG/ML IJ SOLN
1000.0000 ug | Freq: Once | INTRAMUSCULAR | Status: AC
  Administered 2015-03-02: 1000 ug via INTRAMUSCULAR

## 2015-03-04 ENCOUNTER — Encounter: Payer: Self-pay | Admitting: Family Medicine

## 2015-03-04 ENCOUNTER — Ambulatory Visit (INDEPENDENT_AMBULATORY_CARE_PROVIDER_SITE_OTHER): Payer: Commercial Managed Care - HMO | Admitting: Family Medicine

## 2015-03-04 VITALS — BP 109/76 | HR 87 | Temp 98.4°F | Ht 65.0 in | Wt 150.0 lb

## 2015-03-04 DIAGNOSIS — E785 Hyperlipidemia, unspecified: Secondary | ICD-10-CM | POA: Diagnosis not present

## 2015-03-04 DIAGNOSIS — M25561 Pain in right knee: Secondary | ICD-10-CM | POA: Diagnosis not present

## 2015-03-04 DIAGNOSIS — E538 Deficiency of other specified B group vitamins: Secondary | ICD-10-CM

## 2015-03-04 NOTE — Progress Notes (Signed)
Pre visit review using our clinic review tool, if applicable. No additional management support is needed unless otherwise documented below in the visit note. 

## 2015-03-04 NOTE — Progress Notes (Signed)
   Subjective:    Patient ID: [name], female    DOB: 1946/10/10, 69 y.o.   MRN: [id number]  HPI Here to check her right knee which started hurting and swelling 2 weeks ago. No recent trauma. Using heat and Tylenol.    Review of Systems  Constitutional: Negative.   Musculoskeletal: Positive for joint swelling, arthralgias and gait problem. Negative for back pain.       Objective:   Physical Exam  Constitutional: She appears well-developed and well-nourished.  Musculoskeletal:  The anterior right knee is mildly swollen but not red or warm. Positive crepitus. She is tender along the medial joint line           Assessment & Plan:  Early osteoarthritis. Stay with Tylenol but switch to ice packs prn. Recheck prn. She was worried about a possible DVT but I reassured her that she does not have one today

## 2015-03-08 ENCOUNTER — Other Ambulatory Visit (INDEPENDENT_AMBULATORY_CARE_PROVIDER_SITE_OTHER): Payer: Commercial Managed Care - HMO

## 2015-03-08 DIAGNOSIS — E538 Deficiency of other specified B group vitamins: Secondary | ICD-10-CM

## 2015-03-08 DIAGNOSIS — E785 Hyperlipidemia, unspecified: Secondary | ICD-10-CM

## 2015-03-08 LAB — LIPID PANEL
CHOL/HDL RATIO: 2
Cholesterol: 210 mg/dL — ABNORMAL HIGH (ref 0–200)
HDL: 85.5 mg/dL (ref >39.00)
LDL CALC: 111 mg/dL — AB (ref 0–99)
NonHDL: 124.5
TRIGLYCERIDES: 67 mg/dL (ref 0.0–149.0)
VLDL: 13.4 mg/dL (ref 0.0–40.0)

## 2015-03-08 LAB — VITAMIN B12: VITAMIN B 12: 947 pg/mL — AB (ref 211–911)

## 2015-03-08 LAB — BASIC METABOLIC PANEL
BUN: 12 mg/dL (ref 6–23)
CHLORIDE: 106 meq/L (ref 96–112)
CO2: 29 mEq/L (ref 19–32)
Calcium: 10.2 mg/dL (ref 8.4–10.5)
Creatinine, Ser: 0.79 mg/dL (ref 0.40–1.20)
GFR: 76.46 mL/min (ref >60.00)
GLUCOSE: 83 mg/dL (ref 70–99)
Potassium: 4.8 mEq/L (ref 3.5–5.1)
SODIUM: 140 meq/L (ref 135–145)

## 2015-03-15 ENCOUNTER — Telehealth: Payer: Self-pay

## 2015-03-15 NOTE — Telephone Encounter (Signed)
[facility] refill request forRivaroxaban (XARELTO PO)

## 2015-03-15 NOTE — Telephone Encounter (Signed)
Refill for one year 

## 2015-03-16 MED ORDER — RIVAROXABAN 20 MG PO TABS: 20.0000 mg | ORAL_TABLET | Freq: Every day | ORAL | Status: DC

## 2015-03-16 NOTE — Telephone Encounter (Signed)
Rx sent 

## 2015-03-17 ENCOUNTER — Encounter: Payer: Self-pay | Admitting: Family Medicine

## 2015-03-17 ENCOUNTER — Ambulatory Visit (INDEPENDENT_AMBULATORY_CARE_PROVIDER_SITE_OTHER): Payer: Commercial Managed Care - HMO | Admitting: Family Medicine

## 2015-03-17 ENCOUNTER — Ambulatory Visit (HOSPITAL_COMMUNITY)
Admission: RE | Admit: 2015-03-17 | Discharge: 2015-03-17 | Disposition: A | Discharge status: Home / Self Care | Payer: Commercial Managed Care - HMO | Source: Ambulatory Visit | Attending: Cardiology | Admitting: Cardiology

## 2015-03-17 VITALS — BP 108/82 | HR 100 | Temp 98.5°F | Wt 150.0 lb

## 2015-03-17 DIAGNOSIS — M25561 Pain in right knee: Secondary | ICD-10-CM | POA: Diagnosis not present

## 2015-03-17 NOTE — Progress Notes (Signed)
Pre visit review using our clinic review tool, if applicable. No additional management support is needed unless otherwise documented below in the visit note. 

## 2015-03-17 NOTE — Progress Notes (Signed)
   Subjective:    Patient ID: Marge Duncans, female    DOB: Jul 06, 1946, 69 y.o.   MRN: [id number]  HPI Here with one week of pain around the right leg, both above and below the knee. No swelling. No recent trauma. She had a DVT in this area several years ago and she is always worried that this could come back. No chest pain or SOB. Using Tylenol and ice.    Review of Systems  Constitutional: Negative.   Respiratory: Negative.   Cardiovascular: Negative.   Musculoskeletal: Positive for myalgias.       Objective:   Physical Exam  Constitutional: She appears well-developed and well-nourished. No distress.  Cardiovascular: Normal rate, regular rhythm, normal heart sounds and intact distal pulses.   Pulmonary/Chest: Effort normal and breath sounds normal.  Musculoskeletal: She exhibits no edema.  The right medial lag is mildly tender just proximal and distal to the knee. No cords felt. No swelling. The right foot is not swollen          Assessment & Plan:  Phlebitis. Doubt there is a DVT but we will send her for a venous doppler today to make sure. Use Tylenol prn. Switch to moist heat and stay off her feet for awhile.

## 2015-03-17 NOTE — Progress Notes (Signed)
Right lower extremity venous duplex completed. No evidence for DVT, SVT, or Baker's cyst. Brianna L Mazza,RVT

## 2015-03-31 ENCOUNTER — Telehealth: Payer: Self-pay | Admitting: Family Medicine

## 2015-03-31 DIAGNOSIS — M25561 Pain in right knee: Secondary | ICD-10-CM

## 2015-03-31 NOTE — Telephone Encounter (Signed)
Pt would like a referral to dr Percell Miller for knee pain phone # [phone]. Pt has [employer]. Can we refer?

## 2015-04-01 NOTE — Telephone Encounter (Signed)
I spoke with pt  

## 2015-04-01 NOTE — Telephone Encounter (Signed)
Referral was done  

## 2015-04-02 ENCOUNTER — Encounter: Payer: Self-pay | Admitting: Family Medicine

## 2015-04-08 DIAGNOSIS — M7051 Other bursitis of knee, right knee: Secondary | ICD-10-CM | POA: Diagnosis not present

## 2015-04-19 ENCOUNTER — Telehealth: Payer: Self-pay

## 2015-04-19 NOTE — Telephone Encounter (Signed)
Left message with pt's son to call back concerning need for mammogram

## 2015-05-07 ENCOUNTER — Encounter (HOSPITAL_COMMUNITY): Payer: Self-pay | Admitting: Emergency Medicine

## 2015-05-07 ENCOUNTER — Emergency Department (HOSPITAL_COMMUNITY)
Admission: EM | Admit: 2015-05-07 | Discharge: 2015-05-07 | Disposition: A | Discharge status: Home / Self Care | Payer: Commercial Managed Care - HMO | Attending: Emergency Medicine | Admitting: Emergency Medicine

## 2015-05-07 ENCOUNTER — Emergency Department (HOSPITAL_COMMUNITY): Payer: Commercial Managed Care - HMO

## 2015-05-07 DIAGNOSIS — J189 Pneumonia, unspecified organism: Secondary | ICD-10-CM | POA: Diagnosis not present

## 2015-05-07 DIAGNOSIS — Z85118 Personal history of other malignant neoplasm of bronchus and lung: Secondary | ICD-10-CM | POA: Diagnosis not present

## 2015-05-07 DIAGNOSIS — M549 Dorsalgia, unspecified: Secondary | ICD-10-CM | POA: Diagnosis not present

## 2015-05-07 DIAGNOSIS — R079 Chest pain, unspecified: Secondary | ICD-10-CM

## 2015-05-07 DIAGNOSIS — J159 Unspecified bacterial pneumonia: Secondary | ICD-10-CM | POA: Insufficient documentation

## 2015-05-07 DIAGNOSIS — I10 Essential (primary) hypertension: Secondary | ICD-10-CM | POA: Insufficient documentation

## 2015-05-07 DIAGNOSIS — R0789 Other chest pain: Secondary | ICD-10-CM | POA: Diagnosis not present

## 2015-05-07 DIAGNOSIS — R531 Weakness: Secondary | ICD-10-CM | POA: Insufficient documentation

## 2015-05-07 DIAGNOSIS — Z87891 Personal history of nicotine dependence: Secondary | ICD-10-CM | POA: Insufficient documentation

## 2015-05-07 DIAGNOSIS — R0602 Shortness of breath: Secondary | ICD-10-CM

## 2015-05-07 DIAGNOSIS — R110 Nausea: Secondary | ICD-10-CM | POA: Insufficient documentation

## 2015-05-07 DIAGNOSIS — R000 Tachycardia, unspecified: Secondary | ICD-10-CM | POA: Insufficient documentation

## 2015-05-07 DIAGNOSIS — R918 Other nonspecific abnormal finding of lung field: Secondary | ICD-10-CM | POA: Diagnosis not present

## 2015-05-07 HISTORY — DX: Malignant (primary) neoplasm, unspecified: C80.1

## 2015-05-07 LAB — COMPREHENSIVE METABOLIC PANEL
ALBUMIN: 3.9 g/dL (ref 3.5–5.0)
ALT: 16 U/L (ref 14–54)
ANION GAP: 9 (ref 5–15)
AST: 25 U/L (ref 15–41)
Alkaline Phosphatase: 44 U/L (ref 38–126)
BILIRUBIN TOTAL: 0.6 mg/dL (ref 0.3–1.2)
BUN: 12 mg/dL (ref 6–20)
CHLORIDE: 106 mmol/L (ref 101–111)
CO2: 23 mmol/L (ref 22–32)
Calcium: 9.4 mg/dL (ref 8.9–10.3)
Creatinine, Ser: 0.81 mg/dL (ref 0.44–1.00)
GFR calc non Af Amer: >60 mL/min (ref >60)
GLUCOSE: 102 mg/dL — AB (ref 65–99)
Potassium: 3.7 mmol/L (ref 3.5–5.1)
Sodium: 138 mmol/L (ref 135–145)
TOTAL PROTEIN: 6.7 g/dL (ref 6.5–8.1)

## 2015-05-07 LAB — CBC
HEMATOCRIT: 41.1 % (ref 36.0–46.0)
Hemoglobin: 14.4 g/dL (ref 12.0–15.0)
MCH: 30.3 pg (ref 26.0–34.0)
MCHC: 35 g/dL (ref 30.0–36.0)
MCV: 86.5 fL (ref 78.0–100.0)
PLATELETS: 155 10*3/uL (ref 150–400)
RBC: 4.75 MIL/uL (ref 3.87–5.11)
RDW: 13.5 % (ref 11.5–15.5)
WBC: 2.5 10*3/uL — AB (ref 4.0–10.5)

## 2015-05-07 LAB — I-STAT TROPONIN, ED: Troponin i, poc: 0 ng/mL (ref 0.00–0.08)

## 2015-05-07 LAB — BRAIN NATRIURETIC PEPTIDE: B Natriuretic Peptide: 41.4 pg/mL (ref 0.0–100.0)

## 2015-05-07 MED ORDER — IOHEXOL 350 MG/ML SOLN
80.0000 mL | Freq: Once | INTRAVENOUS | Status: AC | PRN
  Administered 2015-05-07: 80 mL via INTRAVENOUS

## 2015-05-07 MED ORDER — ACETAMINOPHEN 325 MG PO TABS
650.0000 mg | ORAL_TABLET | Freq: Once | ORAL | Status: AC
  Administered 2015-05-07: 650 mg via ORAL
  Filled 2015-05-07: qty 2

## 2015-05-07 MED ORDER — DOXYCYCLINE HYCLATE 100 MG PO CAPS: 100.0000 mg | ORAL_CAPSULE | Freq: Two times a day (BID) | ORAL | Status: DC

## 2015-05-07 NOTE — ED Notes (Signed)
Patient returned from X-ray 

## 2015-05-07 NOTE — ED Notes (Signed)
Patient returned from CT

## 2015-05-07 NOTE — ED Provider Notes (Addendum)
CSN: [id number]     Arrival date & time 05/07/15  5277 History   First MD Initiated Contact with Patient 05/07/15 0901     Chief Complaint  Patient presents with  . Chest Pain  . Weakness  . Shortness of Breath     (Consider location/radiation/quality/duration/timing/severity/associated sxs/prior Treatment) HPI Comments: Patient presents with a three-week history of indigestion which is worsening. She feels a discomfort in her upper abdomen feeling like she needs to burp all the time and stating her closer feeling tighter in her abdomen. Also 2 days ago she noted more pronounced shortness of breath. Nothing that causes her to stop but she is doing that she has to take deeper breaths.  2 days ago she also noticed a brief sharp pain in her left breast that resolves quickly and has had an ongoing persistent dull ache between her shoulder blades for the last 2-3 days. This morning she has felt extremely fatigued but denies any nausea, vomiting, change in diet. She takes Xarelto daily for an ongoing clotting disorder which is her only medication. She has an inhaler that she uses when necessary last used yesterday with minimal change. She has had an occasional cough but denies URI symptoms. The cough is nonproductive and she gets it maybe 1-2 times a day. She is a former smoker and was treated for lung cancer and is now cancer free for a year and a half.  Patient is a 69 y.o. female presenting with chest pain, weakness, and shortness of breath. The history is provided by the patient.  Chest Pain Pain location:  L chest (between the shoulder blades) Pain quality: aching   Pain quality comment:  One sharp pain in the left lower chest that resolved quickly but pain in between shoulder blades is dull Pain radiates to:  L shoulder and upper back Pain radiates to the back: yes   Pain severity:  Moderate Onset quality:  Gradual Duration:  2 days Timing:  Constant Progression:  Worsening Chronicity:   New Associated symptoms: back pain, cough, nausea, shortness of breath and weakness   Associated symptoms: no fever, no lower extremity edema and not vomiting   Associated symptoms comment:  Fullness in abdomen Risk factors comment:  Lung cancer, hx of clots on xarelto Weakness Associated symptoms include chest pain and shortness of breath.  Shortness of Breath Associated symptoms: chest pain and cough   Associated symptoms: no fever and no vomiting     Past Medical History  Diagnosis Date  . Cancer   . Hypertension    No past surgical history on file. No family history on file. History  Substance Use Topics  . Smoking status: Former Research scientist (life sciences)  . Smokeless tobacco: Not on file  . Alcohol Use: Yes   OB History    No data available     Review of Systems  Constitutional: Negative for fever.  Respiratory: Positive for cough and shortness of breath.   Cardiovascular: Positive for chest pain.  Gastrointestinal: Positive for nausea. Negative for vomiting.  Musculoskeletal: Positive for back pain.  Neurological: Positive for weakness.  All other systems reviewed and are negative.     Allergies  Review of patient's allergies indicates not on file.  Home Medications   Prior to Admission medications   Not on File   BP 139/89 mmHg  Pulse 98  Temp(Src) 98.4 F (36.9 C)  Resp 15  Ht 5' 5.5" (1.664 m)  Wt 142 lb (64.411 kg)  BMI 23.26 kg/m2  SpO2 99% Physical Exam  Constitutional: She is oriented to person, place, and time. She appears well-developed and well-nourished. No distress.  HENT:  Head: Normocephalic and atraumatic.  Mouth/Throat: Oropharynx is clear and moist.  Eyes: Conjunctivae and EOM are normal. Pupils are equal, round, and reactive to light.  Neck: Normal range of motion. Neck supple.  Cardiovascular: Regular rhythm and intact distal pulses.  Tachycardia present.   No murmur heard. Pulmonary/Chest: Effort normal and breath sounds normal. No respiratory  distress. She has no wheezes. She has no rales. She exhibits no tenderness.  Abdominal: Soft. She exhibits no distension. There is no tenderness. There is no rebound and no guarding.  Musculoskeletal: Normal range of motion. She exhibits no edema or tenderness.  Neurological: She is alert and oriented to person, place, and time.  Skin: Skin is warm and dry. No rash noted. No erythema.  Psychiatric: She has a normal mood and affect. Her behavior is normal.  Nursing note and vitals reviewed.   ED Course  Procedures (including critical care time) Labs Review Labs Reviewed  CBC - Abnormal; Notable for the following:    WBC 2.5 (*)    All other components within normal limits  COMPREHENSIVE METABOLIC PANEL - Abnormal; Notable for the following:    Glucose, Bld 102 (*)    All other components within normal limits  BRAIN NATRIURETIC PEPTIDE  I-STAT TROPOININ, ED  Randolm Idol, ED    Imaging Review Dg Chest 2 View  05/07/2015   CLINICAL DATA:  69 year old female with a history of decreased energy in shortness of breath. History of right-sided lung cancer 2014 treated with chemotherapy and radiation therapy.  EXAM: CHEST - 2 VIEW  COMPARISON:  None.  FINDINGS: Cardiac size within normal limits.  Architectural distortion in the right hilum with rightward shift of the mediastinal structures.  No pneumothorax.  No confluent airspace disease. Interlobular septal thickening with interstitial opacities of the right mid lung and lower lung.  No pleural effusion.  Left lung well aerated.  No displaced fracture.  Unremarkable appearance of the upper abdomen.  IMPRESSION: No lobar pneumonia or pleural effusion.  Architectural distortion with interstitial opacities in the right hilum contributing to rightward shift of the mediastinum, may be entirely related to post treatment effect in this patient with a history of prior treatment for right-sided lung carcinoma, however, with no comparison studies  available, recurrence or superimposed acute infection cannot be excluded.  Correlation with prior imaging would be useful. If the patient is establishing new care in this health system, baseline contrast-enhanced CT is recommended to evaluate for mediastinal/hilar pathology.  Signed,  Dulcy Fanny. Earleen Newport, DO  [facility]  [facility]   Electronically Signed   By: Corrie Mckusick D.O.   On: 05/07/2015 09:59   Ct Angio Chest Pe W/cm &/or Wo Cm  05/07/2015   CLINICAL DATA:  Posterior chest and interscapular pain with shortness of breath and fatigue. History of pulmonary embolism and lung cancer. Initial encounter.  EXAM: CT ANGIOGRAPHY CHEST WITH CONTRAST  TECHNIQUE: Multidetector CT imaging of the chest was performed using the standard protocol during bolus administration of intravenous contrast. Multiplanar CT image reconstructions and MIPs were obtained to evaluate the vascular anatomy.  CONTRAST:  72m OMNIPAQUE IOHEXOL 350 MG/ML SOLN  COMPARISON:  Radiograph same date.  CT 04/23/2014.  FINDINGS: Mediastinum: The pulmonary arteries are well opacified with contrast. There is no evidence of acute pulmonary embolism. There is atherosclerosis of the aorta,  great vessels and coronary arteries. There is soft tissue thickening in the right hilar and subcarinal regions, likely secondary to previous radiation therapy. No discretely enlarged mediastinal, hilar or axillary lymph nodes demonstrated. The thyroid gland, trachea and esophagus demonstrate no significant findings.  Lungs/Pleura: There is no pleural effusion.Right hilar distortion, bronchiectasis and perihilar scarring are similar to the prior examination, attributed to previous radiation therapy. No recurrent perihilar mass demonstrated. Airspace opacities within the right middle and lower lobes are mildly progressive. The left lung is clear. There is a stable air cyst within the left upper lobe. No suspicious pulmonary  nodules.  Upper abdomen: Unremarkable.  There is no adrenal mass.  Musculoskeletal/Chest wall: No chest wall lesion or acute osseous findings.Small thoracic disc protrusions identified. No evidence of osseous metastatic disease.  Review of the MIP images confirms the above findings.  IMPRESSION: 1. No evidence of acute pulmonary embolism or other definite acute chest process. 2. Progressive radiation changes in the right perihilar region with increased patchy opacity in the right middle and lower lobes. Mild superimposed inflammation possible. 3. No evidence of recurrent disease or metastases. 4. No explanation for interscapular pain.   Electronically Signed   By: Richardean Sale M.D.   On: 05/07/2015 12:05     EKG Interpretation   Date/Time:  Saturday May 07 2015 08:41:22 EDT Ventricular Rate:  106 PR Interval:  160 QRS Duration: 68 QT Interval:  318 QTC Calculation: 422 R Axis:   66 Text Interpretation:  Sinus tachycardia Septal infarct , age undetermined  No previous tracing Confirmed by Maryan Rued  MD, Drevon Plog ([id number]) on  05/07/2015 9:02:29 AM      MDM   Final diagnoses:  SOB (shortness of breath)  Chest pain  CAP (community acquired pneumonia)    Patient with a significant history of lung cancer status post chemotherapy and radiation, clotting disorder on xarelto who presents today with a two-week history of worsening indigestion, development of shoulder blade pain as well as occasional sharp left chest pain and mild aching in the left shoulder, mild shortness of breath and an occasional cough. Patient denies a prior history of cardiac problems and no family history. She no longer smokes and denies any infectious symptoms. On exam patient has no abdominal or chest pain. Lung sounds are clear. Patient is slightly tachycardic in the low 100s. Oxygen saturations are within normal limits.  She has no unilateral leg pain or swelling.  Concern for ACS versus new lung pathology such as  recurrent cancer versus recurrent PE despite anticoagulation.  CBC, CMP, BNP, troponin, chest x-ray pending. At this point patient has no chest pain but is complaining of a total achy pain between her shoulder plates. She is refusing any medication at this time.  EKG was sinus tachycardia but no evidence for ischemic changes. No history or EKG findings concerning for pericarditis.  12:21 PM Labs are within normal limits except for mild leukopenia with a white blood cell count 2.5. Chest x-ray with abnormal findings in the right upper lobe which may be all due to radiation and prior cancer however will do a CTA to evaluate for recurrent blood clots versus new pathology in the right upper lobe. It is within normal limits and low suspicion that this is cardiac in nature as patient's symptoms of been going on for 2 weeks and feel that if this was cardiac troponin would be elevated by now.  CT showed no evidence of PE however there is increased patchy opacity in  the right middle and lower lobes which could be either from radiation or possibly superimposed inflammation. With patient's new recent cough, fatigue and abdominal discomfort will treat from pneumonia. Will use doxycycline. Patient has not been hospitalized in over a year and has no healthcare associated risk factors.  Blanchie Dessert, MD 05/07/15 1223  Blanchie Dessert, MD 05/07/15 1226

## 2015-05-07 NOTE — ED Notes (Signed)
Pt. Stated, I've had this feeling tired and some pain like in between my lungs and I've had lung cancer ,. Im just not feeling right. Chest pain has just come on .

## 2015-05-31 ENCOUNTER — Encounter: Payer: Self-pay | Admitting: Gastroenterology

## 2015-06-10 DIAGNOSIS — Z08 Encounter for follow-up examination after completed treatment for malignant neoplasm: Secondary | ICD-10-CM | POA: Diagnosis not present

## 2015-06-10 DIAGNOSIS — Z9221 Personal history of antineoplastic chemotherapy: Secondary | ICD-10-CM | POA: Diagnosis not present

## 2015-06-10 DIAGNOSIS — N1330 Unspecified hydronephrosis: Secondary | ICD-10-CM | POA: Diagnosis not present

## 2015-06-10 DIAGNOSIS — Z85118 Personal history of other malignant neoplasm of bronchus and lung: Secondary | ICD-10-CM | POA: Diagnosis not present

## 2015-06-10 DIAGNOSIS — C3400 Malignant neoplasm of unspecified main bronchus: Secondary | ICD-10-CM | POA: Diagnosis not present

## 2015-06-10 DIAGNOSIS — R911 Solitary pulmonary nodule: Secondary | ICD-10-CM | POA: Diagnosis not present

## 2015-06-10 DIAGNOSIS — C3491 Malignant neoplasm of unspecified part of right bronchus or lung: Secondary | ICD-10-CM | POA: Diagnosis not present

## 2015-06-10 DIAGNOSIS — Z9049 Acquired absence of other specified parts of digestive tract: Secondary | ICD-10-CM | POA: Diagnosis not present

## 2015-06-14 ENCOUNTER — Encounter: Payer: Self-pay | Admitting: Family Medicine

## 2015-06-21 DIAGNOSIS — C3400 Malignant neoplasm of unspecified main bronchus: Secondary | ICD-10-CM | POA: Diagnosis not present

## 2015-06-21 DIAGNOSIS — R05 Cough: Secondary | ICD-10-CM | POA: Diagnosis not present

## 2015-09-02 ENCOUNTER — Ambulatory Visit (INDEPENDENT_AMBULATORY_CARE_PROVIDER_SITE_OTHER): Payer: Commercial Managed Care - HMO | Admitting: Family Medicine

## 2015-09-02 DIAGNOSIS — Z23 Encounter for immunization: Secondary | ICD-10-CM | POA: Diagnosis not present

## 2015-09-02 DIAGNOSIS — E538 Deficiency of other specified B group vitamins: Secondary | ICD-10-CM | POA: Diagnosis not present

## 2015-09-02 MED ORDER — CYANOCOBALAMIN 1000 MCG/ML IJ SOLN
1000.0000 ug | Freq: Once | INTRAMUSCULAR | Status: AC
  Administered 2015-09-02: 1000 ug via INTRAMUSCULAR

## 2015-10-17 ENCOUNTER — Encounter: Payer: Self-pay | Admitting: Family Medicine

## 2015-10-17 ENCOUNTER — Ambulatory Visit (INDEPENDENT_AMBULATORY_CARE_PROVIDER_SITE_OTHER): Payer: Commercial Managed Care - HMO | Admitting: Family Medicine

## 2015-10-17 VITALS — BP 108/88 | HR 85 | Temp 98.5°F | Ht 65.5 in | Wt 150.0 lb

## 2015-10-17 DIAGNOSIS — Z0000 Encounter for general adult medical examination without abnormal findings: Secondary | ICD-10-CM

## 2015-10-17 DIAGNOSIS — E538 Deficiency of other specified B group vitamins: Secondary | ICD-10-CM

## 2015-10-17 LAB — POCT URINALYSIS DIPSTICK
Bilirubin, UA: NEGATIVE
Glucose, UA: NEGATIVE
KETONES UA: NEGATIVE
Nitrite, UA: NEGATIVE
PH UA: 5.5
SPEC GRAV UA: 1.025
UROBILINOGEN UA: 0.2

## 2015-10-17 LAB — VITAMIN B12: VITAMIN B 12: 446 pg/mL (ref 211–911)

## 2015-10-17 LAB — HEPATIC FUNCTION PANEL
ALK PHOS: 48 U/L (ref 39–117)
ALT: 11 U/L (ref 0–35)
AST: 13 U/L (ref 0–37)
Albumin: 4.4 g/dL (ref 3.5–5.2)
BILIRUBIN DIRECT: 0.1 mg/dL (ref 0.0–0.3)
BILIRUBIN TOTAL: 0.6 mg/dL (ref 0.2–1.2)
TOTAL PROTEIN: 7.3 g/dL (ref 6.0–8.3)

## 2015-10-17 LAB — LIPID PANEL
CHOL/HDL RATIO: 3
Cholesterol: 229 mg/dL — ABNORMAL HIGH (ref 0–200)
HDL: 80.7 mg/dL (ref >39.00)
LDL CALC: 134 mg/dL — AB (ref 0–99)
NonHDL: 148.54
Triglycerides: 75 mg/dL (ref 0.0–149.0)
VLDL: 15 mg/dL (ref 0.0–40.0)

## 2015-10-17 LAB — BASIC METABOLIC PANEL
BUN: 13 mg/dL (ref 6–23)
CHLORIDE: 104 meq/L (ref 96–112)
CO2: 29 meq/L (ref 19–32)
Calcium: 10.2 mg/dL (ref 8.4–10.5)
Creatinine, Ser: 0.87 mg/dL (ref 0.40–1.20)
GFR: 68.28 mL/min (ref >60.00)
Glucose, Bld: 89 mg/dL (ref 70–99)
POTASSIUM: 4.8 meq/L (ref 3.5–5.1)
SODIUM: 140 meq/L (ref 135–145)

## 2015-10-17 LAB — TSH: TSH: 1.15 u[IU]/mL (ref 0.35–4.50)

## 2015-10-17 MED ORDER — POTASSIUM CHLORIDE ER 10 MEQ PO TBCR: 10.0000 meq | EXTENDED_RELEASE_TABLET | Freq: Every day | ORAL | Status: DC

## 2015-10-17 MED ORDER — CYANOCOBALAMIN 1000 MCG/ML IJ SOLN
1000.0000 ug | Freq: Once | INTRAMUSCULAR | Status: AC
  Administered 2015-10-17: 1000 ug via INTRAMUSCULAR

## 2015-10-17 NOTE — Addendum Note (Signed)
Addended by: Aggie Hacker A on: 10/17/2015 12:03 PM   Modules accepted: Orders

## 2015-10-17 NOTE — Progress Notes (Signed)
Pre visit review using our clinic review tool, if applicable. No additional management support is needed unless otherwise documented below in the visit note. 

## 2015-10-17 NOTE — Progress Notes (Signed)
   Subjective:    Patient ID: [name], female    DOB: 06/17/1946, 70 y.o.   MRN: [id number]  HPI 69 yr old female for a cpx. She has a few things to discuss. First she has had some PND and dry cough for about a month. No SOB or fever. Also she has had more constipation than usual for 3 months.    Review of Systems  Constitutional: Negative.   HENT: Negative.   Eyes: Negative.   Respiratory: Negative.   Cardiovascular: Negative.   Gastrointestinal: Negative.   Genitourinary: Negative for dysuria, urgency, frequency, hematuria, flank pain, decreased urine volume, enuresis, difficulty urinating, pelvic pain and dyspareunia.  Musculoskeletal: Negative.   Skin: Negative.   Neurological: Negative.   Psychiatric/Behavioral: Negative.        Objective:   Physical Exam  Constitutional: She is oriented to person, place, and time. She appears well-developed and well-nourished. No distress.  HENT:  Head: Normocephalic and atraumatic.  Right Ear: External ear normal.  Left Ear: External ear normal.  Nose: Nose normal.  Mouth/Throat: Oropharynx is clear and moist. No oropharyngeal exudate.  Eyes: Conjunctivae and EOM are normal. Pupils are equal, round, and reactive to light. No scleral icterus.  Neck: Normal range of motion. Neck supple. No JVD present. No thyromegaly present.  Cardiovascular: Normal rate, regular rhythm, normal heart sounds and intact distal pulses.  Exam reveals no gallop and no friction rub.   No murmur heard. Pulmonary/Chest: Effort normal and breath sounds normal. No respiratory distress. She has no wheezes. She has no rales. She exhibits no tenderness.  Abdominal: Soft. Bowel sounds are normal. She exhibits no distension and no mass. There is no tenderness. There is no rebound and no guarding.  Musculoskeletal: Normal range of motion. She exhibits no edema or tenderness.  Lymphadenopathy:    She has no cervical adenopathy.  Neurological: She is alert and  oriented to person, place, and time. She has normal reflexes. No cranial nerve deficit. She exhibits normal muscle tone. Coordination normal.  Skin: Skin is warm and dry. No rash noted. No erythema.  Psychiatric: She has a normal mood and affect. Her behavior is normal. Judgment and thought content normal.          Assessment & Plan:  Well exam. Get fasting labs. She seems to have some respiratory allergies, so she can try Claritin. Try Miralax daily for constipation.

## 2015-10-18 LAB — CBC WITH DIFFERENTIAL/PLATELET
BASOS PCT: 0.8 % (ref 0.0–3.0)
Basophils Absolute: 0 10*3/uL (ref 0.0–0.1)
EOS ABS: 0.1 10*3/uL (ref 0.0–0.7)
EOS PCT: 1.3 % (ref 0.0–5.0)
HCT: 45.3 % (ref 36.0–46.0)
HEMOGLOBIN: 15.2 g/dL — AB (ref 12.0–15.0)
Lymphocytes Relative: 16.1 % (ref 12.0–46.0)
Lymphs Abs: 0.9 10*3/uL (ref 0.7–4.0)
MCHC: 33.5 g/dL (ref 30.0–36.0)
MCV: 88.3 fl (ref 78.0–100.0)
MONO ABS: 0.5 10*3/uL (ref 0.1–1.0)
Monocytes Relative: 8.4 % (ref 3.0–12.0)
NEUTROS ABS: 4.1 10*3/uL (ref 1.4–7.7)
Neutrophils Relative %: 73.4 % (ref 43.0–77.0)
PLATELETS: 242 10*3/uL (ref 150.0–400.0)
RBC: 5.13 Mil/uL — ABNORMAL HIGH (ref 3.87–5.11)
RDW: 13.2 % (ref 11.5–15.5)
WBC: 5.6 10*3/uL (ref 4.0–10.5)

## 2015-10-28 MED ORDER — POTASSIUM CHLORIDE ER 10 MEQ PO TBCR: 10.0000 meq | EXTENDED_RELEASE_TABLET | Freq: Every day | ORAL | Status: DC

## 2015-10-28 NOTE — Addendum Note (Signed)
Addended by: Aggie Hacker A on: 10/28/2015 01:57 PM   Modules accepted: Orders

## 2015-11-01 DIAGNOSIS — Z1231 Encounter for screening mammogram for malignant neoplasm of breast: Secondary | ICD-10-CM | POA: Diagnosis not present

## 2015-11-01 LAB — HM MAMMOGRAPHY

## 2015-11-02 ENCOUNTER — Encounter: Payer: Self-pay | Admitting: Family Medicine

## 2015-11-04 ENCOUNTER — Encounter: Payer: Self-pay | Admitting: Family Medicine

## 2015-11-08 ENCOUNTER — Encounter: Payer: Self-pay | Admitting: Family Medicine

## 2015-11-21 DIAGNOSIS — N7689 Other specified inflammation of vagina and vulva: Secondary | ICD-10-CM | POA: Diagnosis not present

## 2016-01-31 DIAGNOSIS — H26492 Other secondary cataract, left eye: Secondary | ICD-10-CM | POA: Diagnosis not present

## 2016-01-31 DIAGNOSIS — H18411 Arcus senilis, right eye: Secondary | ICD-10-CM | POA: Diagnosis not present

## 2016-01-31 DIAGNOSIS — H02839 Dermatochalasis of unspecified eye, unspecified eyelid: Secondary | ICD-10-CM | POA: Diagnosis not present

## 2016-01-31 DIAGNOSIS — H18412 Arcus senilis, left eye: Secondary | ICD-10-CM | POA: Diagnosis not present

## 2016-01-31 DIAGNOSIS — Z961 Presence of intraocular lens: Secondary | ICD-10-CM | POA: Diagnosis not present

## 2016-02-17 DIAGNOSIS — M25519 Pain in unspecified shoulder: Secondary | ICD-10-CM | POA: Diagnosis not present

## 2016-02-17 DIAGNOSIS — J439 Emphysema, unspecified: Secondary | ICD-10-CM | POA: Diagnosis not present

## 2016-02-17 DIAGNOSIS — Z9221 Personal history of antineoplastic chemotherapy: Secondary | ICD-10-CM | POA: Diagnosis not present

## 2016-02-17 DIAGNOSIS — Z85118 Personal history of other malignant neoplasm of bronchus and lung: Secondary | ICD-10-CM | POA: Diagnosis not present

## 2016-02-17 DIAGNOSIS — Z923 Personal history of irradiation: Secondary | ICD-10-CM | POA: Diagnosis not present

## 2016-02-17 DIAGNOSIS — C3490 Malignant neoplasm of unspecified part of unspecified bronchus or lung: Secondary | ICD-10-CM | POA: Diagnosis not present

## 2016-02-17 DIAGNOSIS — R05 Cough: Secondary | ICD-10-CM | POA: Diagnosis not present

## 2016-02-17 DIAGNOSIS — R918 Other nonspecific abnormal finding of lung field: Secondary | ICD-10-CM | POA: Diagnosis not present

## 2016-02-17 DIAGNOSIS — J8410 Pulmonary fibrosis, unspecified: Secondary | ICD-10-CM | POA: Diagnosis not present

## 2016-02-17 DIAGNOSIS — C3400 Malignant neoplasm of unspecified main bronchus: Secondary | ICD-10-CM | POA: Diagnosis not present

## 2016-02-24 DIAGNOSIS — Z9221 Personal history of antineoplastic chemotherapy: Secondary | ICD-10-CM | POA: Diagnosis not present

## 2016-02-24 DIAGNOSIS — R05 Cough: Secondary | ICD-10-CM | POA: Diagnosis not present

## 2016-02-24 DIAGNOSIS — Z923 Personal history of irradiation: Secondary | ICD-10-CM | POA: Diagnosis not present

## 2016-02-24 DIAGNOSIS — C3400 Malignant neoplasm of unspecified main bronchus: Secondary | ICD-10-CM | POA: Diagnosis not present

## 2016-02-24 DIAGNOSIS — C3491 Malignant neoplasm of unspecified part of right bronchus or lung: Secondary | ICD-10-CM | POA: Diagnosis not present

## 2016-02-24 DIAGNOSIS — J9819 Other pulmonary collapse: Secondary | ICD-10-CM | POA: Diagnosis not present

## 2016-02-24 DIAGNOSIS — M25519 Pain in unspecified shoulder: Secondary | ICD-10-CM | POA: Diagnosis not present

## 2016-02-24 DIAGNOSIS — Z85118 Personal history of other malignant neoplasm of bronchus and lung: Secondary | ICD-10-CM | POA: Diagnosis not present

## 2016-02-24 DIAGNOSIS — J701 Chronic and other pulmonary manifestations due to radiation: Secondary | ICD-10-CM | POA: Diagnosis not present

## 2016-02-24 DIAGNOSIS — C3490 Malignant neoplasm of unspecified part of unspecified bronchus or lung: Secondary | ICD-10-CM | POA: Diagnosis not present

## 2016-02-24 DIAGNOSIS — K119 Disease of salivary gland, unspecified: Secondary | ICD-10-CM | POA: Diagnosis not present

## 2016-03-13 DIAGNOSIS — J988 Other specified respiratory disorders: Secondary | ICD-10-CM | POA: Diagnosis not present

## 2016-03-13 DIAGNOSIS — J9811 Atelectasis: Secondary | ICD-10-CM | POA: Diagnosis not present

## 2016-03-13 DIAGNOSIS — J984 Other disorders of lung: Secondary | ICD-10-CM | POA: Diagnosis not present

## 2016-03-13 DIAGNOSIS — J8410 Pulmonary fibrosis, unspecified: Secondary | ICD-10-CM | POA: Diagnosis not present

## 2016-03-13 DIAGNOSIS — R0602 Shortness of breath: Secondary | ICD-10-CM | POA: Diagnosis not present

## 2016-03-13 DIAGNOSIS — C3400 Malignant neoplasm of unspecified main bronchus: Secondary | ICD-10-CM | POA: Diagnosis not present

## 2016-03-13 DIAGNOSIS — Z923 Personal history of irradiation: Secondary | ICD-10-CM | POA: Diagnosis not present

## 2016-03-13 DIAGNOSIS — Z85118 Personal history of other malignant neoplasm of bronchus and lung: Secondary | ICD-10-CM | POA: Diagnosis not present

## 2016-03-13 DIAGNOSIS — J479 Bronchiectasis, uncomplicated: Secondary | ICD-10-CM | POA: Diagnosis not present

## 2016-03-13 DIAGNOSIS — Z9221 Personal history of antineoplastic chemotherapy: Secondary | ICD-10-CM | POA: Diagnosis not present

## 2016-03-19 ENCOUNTER — Other Ambulatory Visit: Payer: Self-pay | Admitting: Family Medicine

## 2016-03-19 NOTE — Telephone Encounter (Signed)
Can we refill this? 

## 2016-03-20 DIAGNOSIS — J209 Acute bronchitis, unspecified: Secondary | ICD-10-CM | POA: Diagnosis not present

## 2016-03-20 DIAGNOSIS — J9809 Other diseases of bronchus, not elsewhere classified: Secondary | ICD-10-CM | POA: Diagnosis not present

## 2016-03-20 DIAGNOSIS — Z885 Allergy status to narcotic agent status: Secondary | ICD-10-CM | POA: Diagnosis not present

## 2016-03-20 DIAGNOSIS — Z85118 Personal history of other malignant neoplasm of bronchus and lung: Secondary | ICD-10-CM | POA: Diagnosis not present

## 2016-03-20 DIAGNOSIS — Z79899 Other long term (current) drug therapy: Secondary | ICD-10-CM | POA: Diagnosis not present

## 2016-03-20 DIAGNOSIS — Z7951 Long term (current) use of inhaled steroids: Secondary | ICD-10-CM | POA: Diagnosis not present

## 2016-03-20 DIAGNOSIS — J42 Unspecified chronic bronchitis: Secondary | ICD-10-CM | POA: Diagnosis not present

## 2016-03-20 DIAGNOSIS — C3490 Malignant neoplasm of unspecified part of unspecified bronchus or lung: Secondary | ICD-10-CM | POA: Diagnosis not present

## 2016-03-20 DIAGNOSIS — J9811 Atelectasis: Secondary | ICD-10-CM | POA: Diagnosis not present

## 2016-03-22 ENCOUNTER — Ambulatory Visit (INDEPENDENT_AMBULATORY_CARE_PROVIDER_SITE_OTHER): Payer: Commercial Managed Care - HMO | Admitting: Family Medicine

## 2016-03-22 ENCOUNTER — Encounter: Payer: Self-pay | Admitting: Family Medicine

## 2016-03-22 VITALS — BP 130/91 | HR 92 | Temp 98.5°F | Ht 65.5 in | Wt 150.0 lb

## 2016-03-22 DIAGNOSIS — R8290 Unspecified abnormal findings in urine: Secondary | ICD-10-CM | POA: Diagnosis not present

## 2016-03-22 DIAGNOSIS — N390 Urinary tract infection, site not specified: Secondary | ICD-10-CM

## 2016-03-22 DIAGNOSIS — E538 Deficiency of other specified B group vitamins: Secondary | ICD-10-CM

## 2016-03-22 LAB — POC URINALSYSI DIPSTICK (AUTOMATED)
BILIRUBIN UA: NEGATIVE
GLUCOSE UA: NEGATIVE
KETONES UA: NEGATIVE
PH UA: 6
Protein, UA: NEGATIVE
Spec Grav, UA: >=1.03
Urobilinogen, UA: 0.2

## 2016-03-22 MED ORDER — CYANOCOBALAMIN 1000 MCG/ML IJ SOLN
1000.0000 ug | Freq: Once | INTRAMUSCULAR | Status: AC
  Administered 2016-03-22: 1000 ug via INTRAMUSCULAR

## 2016-03-22 MED ORDER — NITROFURANTOIN MONOHYD MACRO 100 MG PO CAPS: 100.0000 mg | ORAL_CAPSULE | Freq: Two times a day (BID) | ORAL | Status: DC

## 2016-03-22 NOTE — Progress Notes (Signed)
   Subjective:    Patient ID: [name], female    DOB: [birth year], 70 y.o.   MRN: [id number]  HPI Here for 2 days of urgency to urinate and foul smelling urine. No burning or fever.   Review of Systems  Constitutional: Negative.   Genitourinary: Positive for urgency and frequency. Negative for dysuria, hematuria and flank pain.       Objective:   Physical Exam  Constitutional: She appears well-developed and well-nourished.  Abdominal: Soft. Bowel sounds are normal. She exhibits no distension and no mass. There is no tenderness. There is no rebound and no guarding.          Assessment & Plan:  UTI, treat with Macrobid. Drink fluids.  Laurey Morale, MD

## 2016-03-22 NOTE — Addendum Note (Signed)
Addended by: Westley Hummer B on: 03/22/2016 10:57 AM   Modules accepted: Orders

## 2016-03-22 NOTE — Progress Notes (Signed)
Pre visit review using our clinic review tool, if applicable. No additional management support is needed unless otherwise documented below in the visit note. 

## 2016-03-24 LAB — URINE CULTURE: Colony Count: >=100000

## 2016-04-19 ENCOUNTER — Encounter: Payer: Self-pay | Admitting: Family Medicine

## 2016-04-19 ENCOUNTER — Ambulatory Visit (INDEPENDENT_AMBULATORY_CARE_PROVIDER_SITE_OTHER): Payer: Commercial Managed Care - HMO | Admitting: Family Medicine

## 2016-04-19 VITALS — BP 117/87 | HR 86 | Temp 98.3°F | Ht 65.5 in | Wt 154.0 lb

## 2016-04-19 DIAGNOSIS — L03115 Cellulitis of right lower limb: Secondary | ICD-10-CM

## 2016-04-19 DIAGNOSIS — E538 Deficiency of other specified B group vitamins: Secondary | ICD-10-CM

## 2016-04-19 MED ORDER — CEPHALEXIN 500 MG PO CAPS: 500.0000 mg | ORAL_CAPSULE | Freq: Three times a day (TID) | ORAL | Status: AC

## 2016-04-19 MED ORDER — CYANOCOBALAMIN 1000 MCG/ML IJ SOLN
1000.0000 ug | Freq: Once | INTRAMUSCULAR | Status: AC
  Administered 2016-04-19: 1000 ug via INTRAMUSCULAR

## 2016-04-19 NOTE — Progress Notes (Signed)
Pre visit review using our clinic review tool, if applicable. No additional management support is needed unless otherwise documented below in the visit note. 

## 2016-04-20 NOTE — Progress Notes (Signed)
   Subjective:    Patient ID: [name], female    DOB: [birth year], 70 y.o.   MRN: [id number]  HPI Here for infection on the right lower leg. She noticed a red bump one week ago and thought it might have been an insect bite. It is mildly painful. Now for 2 days is has become red and swollen. No fevers or body aches.    Review of Systems  Constitutional: Negative.   Respiratory: Negative.   Cardiovascular: Negative.   Skin: Positive for wound.  Neurological: Negative.        Objective:   Physical Exam  Constitutional: She appears well-developed and well-nourished.  Cardiovascular: Normal rate, regular rhythm, normal heart sounds and intact distal pulses.   Pulmonary/Chest: Effort normal and breath sounds normal.  Skin:  The right lower shin has a small abraided area surrounded by erythema and warmth, mildly tender           Assessment & Plan:  Cellulitis, possibly from a spider bite. Cover with Keflex.  Laurey Morale, MD

## 2016-04-23 ENCOUNTER — Emergency Department (HOSPITAL_COMMUNITY): Payer: Commercial Managed Care - HMO

## 2016-04-23 ENCOUNTER — Encounter (HOSPITAL_COMMUNITY): Payer: Self-pay | Admitting: Nurse Practitioner

## 2016-04-23 ENCOUNTER — Emergency Department (HOSPITAL_COMMUNITY)
Admission: EM | Admit: 2016-04-23 | Discharge: 2016-04-23 | Disposition: A | Discharge status: Home / Self Care | Payer: Commercial Managed Care - HMO | Attending: Emergency Medicine | Admitting: Emergency Medicine

## 2016-04-23 DIAGNOSIS — Z79899 Other long term (current) drug therapy: Secondary | ICD-10-CM | POA: Insufficient documentation

## 2016-04-23 DIAGNOSIS — Z85118 Personal history of other malignant neoplasm of bronchus and lung: Secondary | ICD-10-CM | POA: Diagnosis not present

## 2016-04-23 DIAGNOSIS — Z86718 Personal history of other venous thrombosis and embolism: Secondary | ICD-10-CM | POA: Insufficient documentation

## 2016-04-23 DIAGNOSIS — I10 Essential (primary) hypertension: Secondary | ICD-10-CM | POA: Diagnosis not present

## 2016-04-23 DIAGNOSIS — Z7951 Long term (current) use of inhaled steroids: Secondary | ICD-10-CM | POA: Insufficient documentation

## 2016-04-23 DIAGNOSIS — Z87891 Personal history of nicotine dependence: Secondary | ICD-10-CM | POA: Diagnosis not present

## 2016-04-23 DIAGNOSIS — H269 Unspecified cataract: Secondary | ICD-10-CM | POA: Insufficient documentation

## 2016-04-23 DIAGNOSIS — Z7901 Long term (current) use of anticoagulants: Secondary | ICD-10-CM | POA: Diagnosis not present

## 2016-04-23 DIAGNOSIS — Z792 Long term (current) use of antibiotics: Secondary | ICD-10-CM | POA: Insufficient documentation

## 2016-04-23 DIAGNOSIS — Z8701 Personal history of pneumonia (recurrent): Secondary | ICD-10-CM | POA: Diagnosis not present

## 2016-04-23 DIAGNOSIS — J209 Acute bronchitis, unspecified: Secondary | ICD-10-CM | POA: Diagnosis not present

## 2016-04-23 DIAGNOSIS — R05 Cough: Secondary | ICD-10-CM

## 2016-04-23 DIAGNOSIS — Z8639 Personal history of other endocrine, nutritional and metabolic disease: Secondary | ICD-10-CM | POA: Diagnosis not present

## 2016-04-23 DIAGNOSIS — R0602 Shortness of breath: Secondary | ICD-10-CM | POA: Diagnosis not present

## 2016-04-23 DIAGNOSIS — R059 Cough, unspecified: Secondary | ICD-10-CM

## 2016-04-23 DIAGNOSIS — Z862 Personal history of diseases of the blood and blood-forming organs and certain disorders involving the immune mechanism: Secondary | ICD-10-CM | POA: Insufficient documentation

## 2016-04-23 LAB — CBC
HCT: 42.4 % (ref 36.0–46.0)
Hemoglobin: 14.5 g/dL (ref 12.0–15.0)
MCH: 29.4 pg (ref 26.0–34.0)
MCHC: 34.2 g/dL (ref 30.0–36.0)
MCV: 85.8 fL (ref 78.0–100.0)
Platelets: 190 10*3/uL (ref 150–400)
RBC: 4.94 MIL/uL (ref 3.87–5.11)
RDW: 12.9 % (ref 11.5–15.5)
WBC: 6.5 10*3/uL (ref 4.0–10.5)

## 2016-04-23 LAB — BASIC METABOLIC PANEL
ANION GAP: 9 (ref 5–15)
BUN: 9 mg/dL (ref 6–20)
CO2: 22 mmol/L (ref 22–32)
Calcium: 9.7 mg/dL (ref 8.9–10.3)
Chloride: 105 mmol/L (ref 101–111)
Creatinine, Ser: 0.87 mg/dL (ref 0.44–1.00)
GFR calc Af Amer: >60 mL/min (ref >60)
GLUCOSE: 98 mg/dL (ref 65–99)
Potassium: 3.9 mmol/L (ref 3.5–5.1)
SODIUM: 136 mmol/L (ref 135–145)

## 2016-04-23 LAB — D-DIMER, QUANTITATIVE: D-Dimer, Quant: <0.27 ug/mL-FEU (ref 0.00–0.50)

## 2016-04-23 LAB — I-STAT TROPONIN, ED: TROPONIN I, POC: 0 ng/mL (ref 0.00–0.08)

## 2016-04-23 LAB — I-STAT CG4 LACTIC ACID, ED: LACTIC ACID, VENOUS: 1.25 mmol/L (ref 0.5–2.0)

## 2016-04-23 MED ORDER — BENZONATATE 100 MG PO CAPS: 100.0000 mg | ORAL_CAPSULE | Freq: Three times a day (TID) | ORAL | Status: DC

## 2016-04-23 MED ORDER — HYDROCOD POLST-CPM POLST ER 10-8 MG/5ML PO SUER: 5.0000 mL | Freq: Two times a day (BID) | ORAL | Status: DC | PRN

## 2016-04-23 NOTE — Discharge Instructions (Signed)
Acute Bronchitis Bronchitis is inflammation of the airways that extend from the windpipe into the lungs (bronchi). The inflammation often causes mucus to develop. This leads to a cough, which is the most common symptom of bronchitis.  In acute bronchitis, the condition usually develops suddenly and goes away over time, usually in a couple weeks. Smoking, allergies, and asthma can make bronchitis worse. Repeated episodes of bronchitis may cause further lung problems.  CAUSES Acute bronchitis is most often caused by the same virus that causes a cold. The virus can spread from person to person (contagious) through coughing, sneezing, and touching contaminated objects. SIGNS AND SYMPTOMS   Cough.   Fever.   Coughing up mucus.   Body aches.   Chest congestion.   Chills.   Shortness of breath.   Sore throat.  DIAGNOSIS  Acute bronchitis is usually diagnosed through a physical exam. Your health care provider will also ask you questions about your medical history. Tests, such as chest X-rays, are sometimes done to rule out other conditions.  TREATMENT  Acute bronchitis usually goes away in a couple weeks. Oftentimes, no medical treatment is necessary. Medicines are sometimes given for relief of fever or cough. Antibiotic medicines are usually not needed but may be prescribed in certain situations. In some cases, an inhaler may be recommended to help reduce shortness of breath and control the cough. A cool mist vaporizer may also be used to help thin bronchial secretions and make it easier to clear the chest.  HOME CARE INSTRUCTIONS  Get plenty of rest.   Drink enough fluids to keep your urine clear or pale yellow (unless you have a medical condition that requires fluid restriction). Increasing fluids may help thin your respiratory secretions (sputum) and reduce chest congestion, and it will prevent dehydration.   Take medicines only as directed by your health care provider.  If  you were prescribed an antibiotic medicine, finish it all even if you start to feel better.  Avoid smoking and secondhand smoke. Exposure to cigarette smoke or irritating chemicals will make bronchitis worse. If you are a smoker, consider using nicotine gum or skin patches to help control withdrawal symptoms. Quitting smoking will help your lungs heal faster.   Reduce the chances of another bout of acute bronchitis by washing your hands frequently, avoiding people with cold symptoms, and trying not to touch your hands to your mouth, nose, or eyes.   Keep all follow-up visits as directed by your health care provider.  SEEK MEDICAL CARE IF: Your symptoms do not improve after 1 week of treatment.  SEEK IMMEDIATE MEDICAL CARE IF:  You develop an increased fever or chills.   You have chest pain.   You have severe shortness of breath.  You have bloody sputum.   You develop dehydration.  You faint or repeatedly feel like you are going to pass out.  You develop repeated vomiting.  You develop a severe headache. MAKE SURE YOU:   Understand these instructions.  Will watch your condition.  Will get help right away if you are not doing well or get worse.   This information is not intended to replace advice given to you by your health care provider. Make sure you discuss any questions you have with your health care provider.   Document Released: 12/20/2004 Document Revised: 12/03/2014 Document Reviewed: 05/05/2013 Elsevier Interactive Patient Education 2016 [employer]  Cough, Adult Coughing is a reflex that clears your throat and your airways. Coughing helps to heal and  protect your lungs. It is normal to cough occasionally, but a cough that happens with other symptoms or lasts a long time may be a sign of a condition that needs treatment. A cough may last only 2-3 weeks (acute), or it may last longer than 8 weeks (chronic). CAUSES Coughing is commonly caused by:  Breathing  in substances that irritate your lungs.  A viral or bacterial respiratory infection.  Allergies.  Asthma.  Postnasal drip.  Smoking.  Acid backing up from the stomach into the esophagus (gastroesophageal reflux).  Certain medicines.  Chronic lung problems, including COPD (or rarely, lung cancer).  Other medical conditions such as heart failure. HOME CARE INSTRUCTIONS  Pay attention to any changes in your symptoms. Take these actions to help with your discomfort:  Take medicines only as told by your health care provider.  If you were prescribed an antibiotic medicine, take it as told by your health care provider. Do not stop taking the antibiotic even if you start to feel better.  Talk with your health care provider before you take a cough suppressant medicine.  Drink enough fluid to keep your urine clear or pale yellow.  If the air is dry, use a cold steam vaporizer or humidifier in your bedroom or your home to help loosen secretions.  Avoid anything that causes you to cough at work or at home.  If your cough is worse at night, try sleeping in a semi-upright position.  Avoid cigarette smoke. If you smoke, quit smoking. If you need help quitting, ask your health care provider.  Avoid caffeine.  Avoid alcohol.  Rest as needed. SEEK MEDICAL CARE IF:   You have new symptoms.  You cough up pus.  Your cough does not get better after 2-3 weeks, or your cough gets worse.  You cannot control your cough with suppressant medicines and you are losing sleep.  You develop pain that is getting worse or pain that is not controlled with pain medicines.  You have a fever.  You have unexplained weight loss.  You have night sweats. SEEK IMMEDIATE MEDICAL CARE IF:  You cough up blood.  You have difficulty breathing.  Your heartbeat is very fast.   This information is not intended to replace advice given to you by your health care provider. Make sure you discuss any  questions you have with your health care provider.   Document Released: 05/11/2011 Document Revised: 08/03/2015 Document Reviewed: 01/19/2015 Elsevier Interactive Patient Education [employer].

## 2016-04-23 NOTE — ED Provider Notes (Signed)
CSN: [id number]     Arrival date & time 04/23/16  1215 History   First MD Initiated Contact with Patient 04/23/16 1307     Chief Complaint  Patient presents with  . Shortness of Breath     HPI  Patient presents evaluation of cough. She's had a cough for last 4 to 5J days and chills started night last night. Some pain in her right upper back. States this is chronic for her  with a history of lung cancer. Children with radiation therapy and chemotherapy without surgery for her cancer.  Does not feel short of breath. States she is always short of breath and exertion but not at rest does not O short of breath today she's had pneumonia several times in the past. She is told she has some radiations post surgical changes in her chest that "always look like a pneumonia".  No masses. Minimal sputum production.   Continues on Xarelto  Past Medical History  Diagnosis Date  . DVT (deep venous thrombosis) (HCC)     had 2 in right calf & 1 in the left calf  . Cataract   . Clotting disorder ([place])   . Anemia   . Peripheral vascular disease ([place])   . Fibromyalgia   . Squamous cell lung cancer [facility]) 2013    sees Dr. Elmon Kirschner at [facility] and Dr. Jaye Beagle at [place]   . Hyperlipidemia   . Tobacco abuse   . Community acquired pneumonia 01/22/2013  . PONV (postoperative nausea and vomiting)   . Cancer ([place])   . Hypertension    Past Surgical History  Procedure Laterality Date  . Appendectomy    . Cholecystectomy    . Tonsillectomy    . Knee arthroscopy      right  . Laminectomy      lumbar  . Colonoscopy      11/28/09 repeat in 10 years  . Video bronchoscopy  05/30/2012    Procedure: VIDEO BRONCHOSCOPY WITH FLUORO;  Surgeon: Elsie Stain, MD;  Location: [facility];  Service: Cardiopulmonary;  Laterality: N/A;  . Esophagogastroduodenoscopy  October 2013    severe esophagitis, done at [facility]   . Portacath placement     Family History  Problem Relation Age of Onset   . Anemia      FE deficiency   . Arthritis      family history  . Cancer      colon 1st degree relative <60  . Hypertension Mother     family history  . Multiple sclerosis Sister   . Atrial fibrillation Mother   . Cancer Father     NHL  . Cancer Mother     Lung   Social History  Substance Use Topics  . Smoking status: Former Smoker    Quit date: 05/23/2012  . Smokeless tobacco: Never Used  . Alcohol Use: 0.0 oz/week    0 Standard drinks or equivalent per week     Comment: rare   OB History    Gravida Para Term Preterm AB TAB SAB Ectopic Multiple Living   0 0 0 0 0 0 0 0       Review of Systems  Constitutional: Negative for fever, chills, diaphoresis, appetite change and fatigue.  HENT: Negative for mouth sores, sore throat and trouble swallowing.   Eyes: Negative for visual disturbance.  Respiratory: Positive for cough. Negative for chest tightness, shortness of breath and wheezing.   Cardiovascular: Negative for chest  pain.  Gastrointestinal: Negative for nausea, vomiting, abdominal pain, diarrhea and abdominal distention.  Endocrine: Negative for polydipsia, polyphagia and polyuria.  Genitourinary: Negative for dysuria, frequency and hematuria.  Musculoskeletal: Negative for gait problem.  Skin: Negative for color change, pallor and rash.  Neurological: Negative for dizziness, syncope, light-headedness and headaches.  Hematological: Does not bruise/bleed easily.  Psychiatric/Behavioral: Negative for behavioral problems and confusion.      Allergies  Zofran and Morphine and related  Home Medications   Prior to Admission medications   Medication Sig Start Date End Date Taking? Authorizing Provider  acetaminophen (TYLENOL) 500 MG tablet Take 1,000 mg by mouth every 6 (six) hours as needed for mild pain.    Historical Provider, MD  albuterol (PROVENTIL HFA;VENTOLIN HFA) 108 (90 BASE) MCG/ACT inhaler Inhale 2 puffs into the lungs every 4 (four) hours as needed for  wheezing or shortness of breath. 02/02/15   Laurey Morale, MD  benzonatate (TESSALON) 100 MG capsule Take 1 capsule (100 mg total) by mouth every 8 (eight) hours. 04/23/16   Tanna Furry, MD  cephALEXin (KEFLEX) 500 MG capsule Take 1 capsule (500 mg total) by mouth 3 (three) times daily. 04/19/16 04/29/16  Laurey Morale, MD  chlorpheniramine-HYDROcodone [facility] PENNKINETIC ER) 10-8 MG/5ML SUER Take 5 mLs by mouth every 12 (twelve) hours as needed for cough. 04/23/16   Tanna Furry, MD  CYANOCOBALAMIN IJ Inject 1 application as directed every 30 (thirty) days. Reported on 03/22/2016    Historical Provider, MD  Fluticasone Propionate HFA (FLOVENT HFA IN) Inhale into the lungs 2 (two) times daily. 2 puffs    Historical Provider, MD  omeprazole (PRILOSEC) 20 MG capsule Take 20 mg by mouth 2 (two) times daily as needed. 10/02/12   Laurey Morale, MD  potassium chloride (KLOR-CON 10) 10 MEQ tablet Take 1 tablet (10 mEq total) by mouth daily. 10/28/15   Laurey Morale, MD  XARELTO 20 MG TABS tablet TAKE ONE TABLET BY MOUTH ONCE DAILY WITH SUPPER 03/19/16   Laurey Morale, MD   BP 104/85 mmHg  Pulse 117  Temp(Src) 99.1 F (37.3 C) (Oral)  Resp 18  SpO2 97% Physical Exam  Constitutional: She is oriented to person, place, and time. She appears well-developed and well-nourished. No distress.  HENT:  Head: Normocephalic.  Eyes: Conjunctivae are normal. Pupils are equal, round, and reactive to light. No scleral icterus.  Neck: Normal range of motion. Neck supple. No thyromegaly present.  Cardiovascular: Normal rate and regular rhythm.  Exam reveals no gallop and no friction rub.   No murmur heard. Pulmonary/Chest: Effort normal and breath sounds normal. No respiratory distress. She has no wheezes. She has no rales.  Clear bilateral breath sounds. Wheezing rales or rhonchi. Resting heart rate 105.  Abdominal: Soft. Bowel sounds are normal. She exhibits no distension. There is no tenderness. There is no rebound.   Musculoskeletal: Normal range of motion.  Neurological: She is alert and oriented to person, place, and time.  Skin: Skin is warm and dry. No rash noted.  Psychiatric: She has a normal mood and affect. Her behavior is normal.    ED Course  Procedures (including critical care time) Labs Review Labs Reviewed  BASIC METABOLIC PANEL  CBC  D-DIMER, QUANTITATIVE (NOT AT [facility])  Randolm Idol, ED  I-STAT CG4 LACTIC ACID, ED    Imaging Review Dg Chest 2 View  04/23/2016  CLINICAL DATA:  Shortness of breath and cough. History of right lung cancer treated with  radiation therapy. EXAM: CHEST  2 VIEW COMPARISON:  05/07/2015 chest radiograph. FINDINGS: Stable cardiomediastinal silhouette with normal heart size. No pneumothorax. No pleural effusion. There is no appreciable change in right hilar fullness, right parahilar patchy opacity and volume loss and distortion in the right mid lung. No acute consolidative airspace disease. Cholecystectomy clips are seen in the right upper quadrant of the abdomen. IMPRESSION: No appreciable interval change since 05/07/2015 chest radiograph, with distortion, volume loss and parahilar opacity in the right mid lung, probably reflecting post treatment change. Electronically Signed   By: Ilona Sorrel M.D.   On: 04/23/2016 14:03   I have personally reviewed and evaluated these images and lab results as part of my medical decision-making.   EKG Interpretation None      MDM   Final diagnoses:  Cough  Acute bronchitis, unspecified organism    D-dimer not elevated. Patient anticoagulated. Doubt pulmonary embolus. No leg swelling. No fevers. Recheck heart rate 99. Saturating 97% on room air. Ambulatory in the department. without dyspnea.        Tanna Furry, MD 04/23/16 1504

## 2016-04-23 NOTE — ED Notes (Signed)
She c/o 2 day history of nonproductive cough, chills. She reports pain in her left upper back, which she reports is chronic due to lung cancer and collapsed lung. She is also concerned due to hx pneumonia. She is currently on cephalexin for a skin infection. She is alert, able to speak full sentences in triage, breathing became mildly labored while coughing.

## 2016-05-02 ENCOUNTER — Ambulatory Visit (INDEPENDENT_AMBULATORY_CARE_PROVIDER_SITE_OTHER): Payer: Commercial Managed Care - HMO | Admitting: Family Medicine

## 2016-05-02 ENCOUNTER — Encounter: Payer: Self-pay | Admitting: Family Medicine

## 2016-05-02 VITALS — BP 140/90 | HR 86 | Temp 97.8°F | Wt 153.1 lb

## 2016-05-02 DIAGNOSIS — J209 Acute bronchitis, unspecified: Secondary | ICD-10-CM

## 2016-05-02 MED ORDER — AMOXICILLIN-POT CLAVULANATE 875-125 MG PO TABS: 1.0000 | ORAL_TABLET | Freq: Two times a day (BID) | ORAL | Status: DC

## 2016-05-02 NOTE — Progress Notes (Signed)
   Subjective:    Patient ID: [name], female    DOB: 1946/11/13, 70 y.o.   MRN: [id number]  HPI Here to follow up an ER visit on 04-23-16 for coughing. She has had a dry cough for about 10 days now. No chest pain but her muscles are sore from coughing. Some wheezing and SOB. She has been using her Flovent inhaler but not the albuterol. Drinking fluids. Her CXR that day showed no infiltrates so it was felt that she had a viral URI. However she has not improved since then.    Review of Systems  Constitutional: Negative.   HENT: Negative.   Eyes: Negative.   Respiratory: Positive for cough, chest tightness, shortness of breath and wheezing.   Cardiovascular: Negative.        Objective:   Physical Exam  Constitutional: She appears well-developed and well-nourished.  Frequent coughing   HENT:  Right Ear: External ear normal.  Left Ear: External ear normal.  Nose: Nose normal.  Mouth/Throat: Oropharynx is clear and moist.  Eyes: Conjunctivae are normal.  Neck: No thyromegaly present.  Pulmonary/Chest: Effort normal. No respiratory distress. She has no rales.  Scattered rhonchi and wheezes   Lymphadenopathy:    She has no cervical adenopathy.          Assessment & Plan:  Bronchitis, treat with Augmentin. I advised her to use the albuterol inhaler every 4 hours.  Laurey Morale, MD

## 2016-05-18 ENCOUNTER — Ambulatory Visit: Payer: Commercial Managed Care - HMO | Admitting: Family Medicine

## 2016-05-18 ENCOUNTER — Telehealth: Payer: Self-pay | Admitting: Family Medicine

## 2016-05-18 MED ORDER — AZITHROMYCIN 250 MG PO TABS: ORAL_TABLET | ORAL | Status: DC

## 2016-05-18 NOTE — Telephone Encounter (Signed)
Stop the Augmentin. Use Imodium prn. Take an OTC probiotic like Align every day. Call in a Zpack

## 2016-05-18 NOTE — Telephone Encounter (Signed)
I sent script e-scribe and spoke with pt. 

## 2016-05-18 NOTE — Telephone Encounter (Signed)
Pt was on schedule this morning, however she had to call and cancel this. She was having diarrhea this morning and unable to keep appointment. She was seen here recently for acute bronchitis and still coughing and not feeling well. Can you recommend something else to try?

## 2016-05-25 ENCOUNTER — Ambulatory Visit: Payer: Commercial Managed Care - HMO | Admitting: Family Medicine

## 2016-05-25 ENCOUNTER — Encounter: Payer: Self-pay | Admitting: Family Medicine

## 2016-05-25 ENCOUNTER — Ambulatory Visit (INDEPENDENT_AMBULATORY_CARE_PROVIDER_SITE_OTHER): Payer: Commercial Managed Care - HMO | Admitting: Family Medicine

## 2016-05-25 VITALS — BP 98/78 | HR 97 | Temp 98.0°F | Ht 65.5 in | Wt 152.0 lb

## 2016-05-25 DIAGNOSIS — E538 Deficiency of other specified B group vitamins: Secondary | ICD-10-CM | POA: Diagnosis not present

## 2016-05-25 DIAGNOSIS — J209 Acute bronchitis, unspecified: Secondary | ICD-10-CM

## 2016-05-25 MED ORDER — PREDNISONE 10 MG PO TABS: ORAL_TABLET | ORAL | Status: DC

## 2016-05-25 MED ORDER — CLARITHROMYCIN 500 MG PO TABS: 500.0000 mg | ORAL_TABLET | Freq: Two times a day (BID) | ORAL | Status: DC

## 2016-05-25 MED ORDER — CYANOCOBALAMIN 1000 MCG/ML IJ SOLN
1000.0000 ug | Freq: Once | INTRAMUSCULAR | Status: AC
  Administered 2016-05-25: 1000 ug via INTRAMUSCULAR

## 2016-05-25 NOTE — Progress Notes (Signed)
Pre visit review using our clinic review tool, if applicable. No additional management support is needed unless otherwise documented below in the visit note. 

## 2016-05-25 NOTE — Progress Notes (Signed)
   Subjective:    Patient ID: [name], female    DOB: [birth year], 70 y.o.   MRN: [id number]  HPI Here for 6 weeks of chest tightness, wheezing, and productive coughing. The sputum has turned from yellow to green. No chest pain or fever. She took a course of Augmentin with no response and then she took a Zpack. This seemed to help a little. Using Flovent and albuterol daily.    Review of Systems  Constitutional: Negative.   HENT: Positive for congestion. Negative for postnasal drip, sinus pressure and sore throat.   Eyes: Negative.   Respiratory: Positive for cough, chest tightness and wheezing. Negative for shortness of breath.   Cardiovascular: Negative.        Objective:   Physical Exam  Constitutional: She appears well-developed and well-nourished. No distress.  HENT:  Right Ear: External ear normal.  Left Ear: External ear normal.  Nose: Nose normal.  Mouth/Throat: Oropharynx is clear and moist.  Eyes: Conjunctivae are normal.  Neck: No thyromegaly present.  Cardiovascular: Normal rate, regular rhythm, normal heart sounds and intact distal pulses.   Pulmonary/Chest: Effort normal. She has no rales.  Scattered rhonchi and wheezes   Lymphadenopathy:    She has no cervical adenopathy.          Assessment & Plan:  Partially treated bronchitis. Given Biaxin and a prednisone taper.  Laurey Morale, MD

## 2016-06-06 ENCOUNTER — Ambulatory Visit (INDEPENDENT_AMBULATORY_CARE_PROVIDER_SITE_OTHER): Payer: Commercial Managed Care - HMO | Admitting: Family Medicine

## 2016-06-06 DIAGNOSIS — E538 Deficiency of other specified B group vitamins: Secondary | ICD-10-CM | POA: Diagnosis not present

## 2016-06-06 MED ORDER — CYANOCOBALAMIN 1000 MCG/ML IJ SOLN
1000.0000 ug | Freq: Once | INTRAMUSCULAR | Status: AC
  Administered 2016-06-06: 1000 ug via INTRAMUSCULAR

## 2016-06-20 ENCOUNTER — Ambulatory Visit (INDEPENDENT_AMBULATORY_CARE_PROVIDER_SITE_OTHER): Payer: Commercial Managed Care - HMO | Admitting: Family Medicine

## 2016-06-20 DIAGNOSIS — E538 Deficiency of other specified B group vitamins: Secondary | ICD-10-CM | POA: Diagnosis not present

## 2016-06-20 MED ORDER — CYANOCOBALAMIN 1000 MCG/ML IJ SOLN
1000.0000 ug | Freq: Once | INTRAMUSCULAR | Status: AC
  Administered 2016-06-20: 1000 ug via INTRAMUSCULAR

## 2016-07-04 ENCOUNTER — Ambulatory Visit (INDEPENDENT_AMBULATORY_CARE_PROVIDER_SITE_OTHER): Payer: Commercial Managed Care - HMO | Admitting: Family Medicine

## 2016-07-04 ENCOUNTER — Telehealth: Payer: Self-pay | Admitting: Family Medicine

## 2016-07-04 DIAGNOSIS — E538 Deficiency of other specified B group vitamins: Secondary | ICD-10-CM

## 2016-07-04 MED ORDER — CYANOCOBALAMIN 1000 MCG/ML IJ SOLN
1000.0000 ug | Freq: Once | INTRAMUSCULAR | Status: AC
  Administered 2016-07-04: 1000 ug via INTRAMUSCULAR

## 2016-07-04 MED ORDER — BUPROPION HCL ER (XL) 300 MG PO TB24
300.0000 mg | ORAL_TABLET | Freq: Every day | ORAL | 11 refills | Status: DC
Start: 2016-07-04 — End: 2017-10-31

## 2016-07-04 NOTE — Addendum Note (Signed)
Addended by: Alysia Penna A on: 07/04/2016 11:36 AM   Modules accepted: Orders

## 2016-07-04 NOTE — Telephone Encounter (Signed)
We will email in a rx for Wellbutrin XL 300 mg daily.

## 2016-07-04 NOTE — Telephone Encounter (Signed)
Pt requesting to restart on Wellbutrin, has taken a 150 mg & 300 mg in the past.

## 2016-07-18 ENCOUNTER — Ambulatory Visit: Payer: Commercial Managed Care - HMO | Admitting: Family Medicine

## 2016-07-25 ENCOUNTER — Ambulatory Visit (INDEPENDENT_AMBULATORY_CARE_PROVIDER_SITE_OTHER): Payer: Commercial Managed Care - HMO | Admitting: Family Medicine

## 2016-07-25 DIAGNOSIS — E538 Deficiency of other specified B group vitamins: Secondary | ICD-10-CM

## 2016-07-25 DIAGNOSIS — Z23 Encounter for immunization: Secondary | ICD-10-CM

## 2016-07-25 MED ORDER — CYANOCOBALAMIN 1000 MCG/ML IJ SOLN
1000.0000 ug | Freq: Once | INTRAMUSCULAR | Status: AC
  Administered 2016-07-25: 1000 ug via INTRAMUSCULAR

## 2016-08-01 ENCOUNTER — Ambulatory Visit: Payer: Commercial Managed Care - HMO | Admitting: Family Medicine

## 2016-08-06 ENCOUNTER — Ambulatory Visit: Payer: Commercial Managed Care - HMO | Admitting: Family Medicine

## 2016-08-06 ENCOUNTER — Encounter: Payer: Self-pay | Admitting: Family Medicine

## 2016-08-06 ENCOUNTER — Ambulatory Visit (INDEPENDENT_AMBULATORY_CARE_PROVIDER_SITE_OTHER): Payer: Commercial Managed Care - HMO | Admitting: Family Medicine

## 2016-08-06 VITALS — BP 120/90 | HR 130 | Wt 153.0 lb

## 2016-08-06 DIAGNOSIS — J209 Acute bronchitis, unspecified: Secondary | ICD-10-CM | POA: Diagnosis not present

## 2016-08-06 MED ORDER — CLARITHROMYCIN 500 MG PO TABS: 500.0000 mg | ORAL_TABLET | Freq: Two times a day (BID) | ORAL | 0 refills | Status: DC

## 2016-08-06 MED ORDER — METHYLPREDNISOLONE 4 MG PO TBPK: ORAL_TABLET | ORAL | 0 refills | Status: DC

## 2016-08-06 NOTE — Progress Notes (Signed)
   Subjective:    Patient ID: [name], female    DOB: 1945/12/09, 70 y.o.   MRN: [id number]  HPI Here for 4 days of chest tightness and a dry cough. Some wheezing. She had fever and body aches the first 2 days but not now. Using her Flovent and Ventolin.    Review of Systems  Constitutional: Negative.   HENT: Negative.   Eyes: Negative.   Respiratory: Positive for cough, chest tightness, shortness of breath and wheezing.   Cardiovascular: Negative.        Objective:   Physical Exam  Constitutional: She appears well-developed and well-nourished. No distress.  Neck: No thyromegaly present.  Cardiovascular: Normal rate, regular rhythm, normal heart sounds and intact distal pulses.   Pulmonary/Chest: Effort normal. No respiratory distress. She has no rales.  No rhonchi but scattered wheezes   Lymphadenopathy:    She has no cervical adenopathy.          Assessment & Plan:  Bronchitis secondary to viral ilness. Given Biaxin and a Medrol dose pack.  Laurey Morale, MD

## 2016-08-08 ENCOUNTER — Ambulatory Visit: Payer: Commercial Managed Care - HMO | Admitting: Family Medicine

## 2016-08-16 ENCOUNTER — Encounter: Payer: Self-pay | Admitting: Family Medicine

## 2016-08-16 ENCOUNTER — Ambulatory Visit (INDEPENDENT_AMBULATORY_CARE_PROVIDER_SITE_OTHER): Payer: Commercial Managed Care - HMO | Admitting: Family Medicine

## 2016-08-16 VITALS — BP 110/80 | HR 91 | Temp 98.3°F | Ht 65.5 in | Wt 151.4 lb

## 2016-08-16 DIAGNOSIS — R300 Dysuria: Secondary | ICD-10-CM

## 2016-08-16 DIAGNOSIS — N390 Urinary tract infection, site not specified: Secondary | ICD-10-CM | POA: Diagnosis not present

## 2016-08-16 DIAGNOSIS — R310 Gross hematuria: Secondary | ICD-10-CM

## 2016-08-16 LAB — POCT URINALYSIS DIPSTICK
Bilirubin, UA: POSITIVE
GLUCOSE UA: NEGATIVE
KETONES UA: NEGATIVE
Nitrite, UA: POSITIVE
UROBILINOGEN UA: 1
pH, UA: 5.5

## 2016-08-16 MED ORDER — NITROFURANTOIN MONOHYD MACRO 100 MG PO CAPS: 100.0000 mg | ORAL_CAPSULE | Freq: Two times a day (BID) | ORAL | 0 refills | Status: AC

## 2016-08-16 NOTE — Progress Notes (Signed)
HPI:  ACUTE VISIT: Chief Complaint  Patient presents with  . Urinary Tract Infection    Started tuesday night, took otc med yesterday, but none today. Just finished abx    [name] is a 70 y.o. female, who is here today complaining of 2-3 days of urinary symptoms.  Dysuria: yes, getting worse. Urinary frequency: Yes, small amount at the time, tenesmus. Urinary urgency: Yes Incontinence: Denies Gross hematuria: Yes. She is reporting prior episodes of gross hematuria, related to UTI's. She tells me she has not had urologic evaluation or work-up in the past. She denies any Hx of nephrolithiasis.  Abdominal pain: suprapubic pressure sensation, mild.   Nausea or vomiting: Denies  Abnormal vaginal bleeding or discharge: Denies   Sexual activity: No Hx of UTI: Yes, last one in 02/2016, Ucx grew E. Coli susceptible to Macrobid, resistant to Bactrim.  OTC medications for this problem: Yes, started last night.  Hx of lung cancer and PE, currently she is on Xarelto. She has not noted blood in stool, melena, or gum/nose bleed. She follows with oncologist regularly.  Currently she is on Clarithromycin for treatment of respiratory infection, 7-8th day, reporting resolution of symptoms.   Review of Systems  Constitutional: Negative for activity change, appetite change, fatigue and fever.  HENT: Negative for nosebleeds.   Gastrointestinal: Negative for abdominal pain, blood in stool, constipation, diarrhea, nausea and vomiting.  Genitourinary: Positive for difficulty urinating, dysuria, frequency, hematuria and urgency. Negative for decreased urine volume, vaginal bleeding and vaginal discharge.  Musculoskeletal: Negative for back pain and myalgias.  Neurological: Negative for dizziness, syncope, weakness and headaches.      Current Outpatient Prescriptions on File Prior to Visit  Medication Sig Dispense Refill  . acetaminophen (TYLENOL) 500 MG tablet Take 1,000  mg by mouth every 6 (six) hours as needed for mild pain.    Marland Kitchen albuterol (PROVENTIL HFA;VENTOLIN HFA) 108 (90 BASE) MCG/ACT inhaler Inhale 2 puffs into the lungs every 4 (four) hours as needed for wheezing or shortness of breath. 1 Inhaler 11  . buPROPion (WELLBUTRIN XL) 300 MG 24 hr tablet Take 1 tablet (300 mg total) by mouth daily. 30 tablet 11  . Fluticasone Propionate HFA (FLOVENT HFA IN) Inhale into the lungs 2 (two) times daily. 2 puffs    . methylPREDNISolone (MEDROL DOSEPAK) 4 MG TBPK tablet As directed 21 tablet 0  . omeprazole (PRILOSEC) 20 MG capsule Take 20 mg by mouth 2 (two) times daily as needed.    . potassium chloride (KLOR-CON 10) 10 MEQ tablet Take 1 tablet (10 mEq total) by mouth daily. 90 tablet 3  . XARELTO 20 MG TABS tablet TAKE ONE TABLET BY MOUTH ONCE DAILY WITH SUPPER 90 tablet 3   No current facility-administered medications on file prior to visit.      Past Medical History:  Diagnosis Date  . Anemia   . Cancer ([place])   . Cataract   . Clotting disorder ([place])   . Community acquired pneumonia 01/22/2013  . DVT (deep venous thrombosis) (HCC)    had 2 in right calf & 1 in the left calf  . Fibromyalgia   . Hyperlipidemia   . Hypertension   . Peripheral vascular disease ([place])   . PONV (postoperative nausea and vomiting)   . Squamous cell lung cancer [facility]) 2013   sees Dr. Elmon Kirschner at [facility] and Dr. Jaye Beagle at [place]   . Tobacco abuse  Allergies  Allergen Reactions  . Zofran [Ondansetron Hcl]     Patient states she should not take Zofran due to her heart.  . Morphine And Related Other (See Comments)    Hard time waking up when taking morphine, codeine, and related medications    Social History   Social History  . Marital status: Married    Spouse name: Gwenlyn Perking  . Number of children: 3  . Years of education: N/A   Occupational History  . Retired Surveyor, minerals. Unemployed   Social History Main Topics  . Smoking status: Former Smoker     Quit date: 05/23/2012  . Smokeless tobacco: Never Used  . Alcohol use 0.0 oz/week     Comment: rare  . Drug use: No  . Sexual activity: No   Other Topics Concern  . None   Social History Narrative   ** Merged History Encounter **       Married.  Lives with husband in [place].  Independent of ADLs.    Vitals:   08/16/16 1526  BP: 110/80  Pulse: 91  Temp: 98.3 F (36.8 C)   Body mass index is 24.81 kg/m.    Physical Exam  Nursing note and vitals reviewed. Constitutional: She is oriented to person, place, and time. She appears well-developed and well-nourished. She does not appear ill. No distress.  HENT:  Head: Atraumatic.  Mouth/Throat: Oropharynx is clear and moist and mucous membranes are normal.  Eyes: Conjunctivae and EOM are normal.  Respiratory: Effort normal and breath sounds normal. No respiratory distress.  GI: Soft. She exhibits no mass. There is no hepatomegaly. There is no tenderness. There is no CVA tenderness.  Musculoskeletal: She exhibits no edema or tenderness.  Neurological: She is alert and oriented to person, place, and time. She has normal strength. Coordination and gait normal.  Skin: Skin is warm. No erythema.  Psychiatric: Her speech is normal. Her mood appears anxious.  Well groomed, good eye contact      ASSESSMENT AND PLAN:     Kallie was seen today for urinary tract infection.  Diagnoses and all orders for this visit:  Dysuria -     POCT urinalysis dipstick  Gross hematuria  Possible causes of reported urinary symptoms discussed. I see urology referral recommendation in 12/2014. Recommend repeating U/A in about 3 months and when she is asymptomatic.  -     Urine culture  Urinary tract infection, site not specified  Ucx ordered.   Empiric abx treatment started today, Macrobid x 5 days. Treatment will be tailored according to Ucx results and susceptibility report.  Clearly instructed about warning signs. F/U if  symptoms persist.   -     nitrofurantoin, macrocrystal-monohydrate, (MACROBID) 100 MG capsule; Take 1 capsule (100 mg total) by mouth 2 (two) times daily. -     Urine culture       -[name] was advised to return or notify a doctor immediately if symptoms worsen or persist or new concerns arise, she voices understanding.       Betty G. Martinique, MD  [facility]. [place] office.

## 2016-08-16 NOTE — Patient Instructions (Addendum)
  [name] I have seen you today for an acute visit because your primary care provider was not available.   1. Dysuria  - POCT urinalysis dipstick  2. Gross hematuria   3. Urinary tract infection, site not specified  - nitrofurantoin, macrocrystal-monohydrate, (MACROBID) 100 MG capsule; Take 1 capsule (100 mg total) by mouth 2 (two) times daily.  Dispense: 10 capsule; Refill: 0   Urine culture in 02/2016 bacteria resistent to bactrim, so Macrobid recommended this time.    Adequate fluid intake, avoid holding urine for long hours, and over the counter Vit C OR cranberry capsules might help.  Today we will treat empirically with antibiotic, which we might need to change when urine culture comes back depending of bacteria susceptibility.  Seek immediate medical attention if severe abdominal pain, vomiting, fever/chills, or worsening symptoms. F/U if symptomatic are not any better after 2-3 days of antibiotic treatment.  Monitor for signs of worsening symptoms and seek immediate medical attention if any concerning/warning symptom as we discussed.     Please be sure you have an appointment already scheduled with your PCP.

## 2016-08-17 ENCOUNTER — Ambulatory Visit: Payer: Commercial Managed Care - HMO | Admitting: Family Medicine

## 2016-08-19 LAB — URINE CULTURE

## 2016-08-27 ENCOUNTER — Ambulatory Visit (INDEPENDENT_AMBULATORY_CARE_PROVIDER_SITE_OTHER): Payer: Commercial Managed Care - HMO | Admitting: Family Medicine

## 2016-08-27 ENCOUNTER — Telehealth: Payer: Self-pay | Admitting: Family Medicine

## 2016-08-27 ENCOUNTER — Encounter: Payer: Self-pay | Admitting: Family Medicine

## 2016-08-27 VITALS — BP 110/83 | HR 109 | Temp 98.2°F | Ht 65.5 in | Wt 153.0 lb

## 2016-08-27 DIAGNOSIS — N390 Urinary tract infection, site not specified: Secondary | ICD-10-CM | POA: Diagnosis not present

## 2016-08-27 DIAGNOSIS — E538 Deficiency of other specified B group vitamins: Secondary | ICD-10-CM

## 2016-08-27 LAB — POC URINALSYSI DIPSTICK (AUTOMATED)
BILIRUBIN UA: NEGATIVE
Glucose, UA: NEGATIVE
KETONES UA: NEGATIVE
PH UA: 6
Urobilinogen, UA: 0.2

## 2016-08-27 MED ORDER — CIPROFLOXACIN HCL 500 MG PO TABS
500.0000 mg | ORAL_TABLET | Freq: Two times a day (BID) | ORAL | 0 refills | Status: DC
Start: 2016-08-27 — End: 2016-11-12

## 2016-08-27 MED ORDER — CYANOCOBALAMIN 1000 MCG/ML IJ SOLN
1000.0000 ug | Freq: Once | INTRAMUSCULAR | Status: AC
  Administered 2016-08-27: 1000 ug via INTRAMUSCULAR

## 2016-08-27 MED ORDER — FLUTICASONE PROPIONATE HFA 110 MCG/ACT IN AERO: 2.0000 | INHALATION_SPRAY | Freq: Two times a day (BID) | RESPIRATORY_TRACT | 6 refills | Status: DC

## 2016-08-27 MED ORDER — ALBUTEROL SULFATE HFA 108 (90 BASE) MCG/ACT IN AERS: 2.0000 | INHALATION_SPRAY | RESPIRATORY_TRACT | 11 refills | Status: DC | PRN

## 2016-08-27 NOTE — Telephone Encounter (Signed)
Pt needs flovent inhaler call into [facility] [address]. Pt was seen today

## 2016-08-27 NOTE — Progress Notes (Signed)
   Subjective:    Patient ID: [name], female    DOB: [birth year], 70 y.o.   MRN: [id number]  HPI Here for recurrent UTI symptoms. She was seen here on 08-16-16 for urinary burning and pressure and a urine culture grew E coli. She was treated with Macrobid, and the bacteria should have been sensitive to this. She did feel better for a few days but then 3 days ago the urinary pressure returned. No fevers. She drinks plenty of water.    Review of Systems  Constitutional: Negative.   Respiratory: Negative.   Cardiovascular: Negative.   Gastrointestinal: Negative.   Genitourinary: Positive for dysuria, frequency and urgency. Negative for flank pain, hematuria and pelvic pain.       Objective:   Physical Exam  Constitutional: She appears well-developed and well-nourished.  Cardiovascular: Normal rate, regular rhythm, normal heart sounds and intact distal pulses.   Pulmonary/Chest: Effort normal and breath sounds normal.  Abdominal: Soft. Bowel sounds are normal. She exhibits no distension and no mass. There is no tenderness. There is no rebound and no guarding.          Assessment & Plan:  Partially treated UTI. Given Cipro for 7 days.  Laurey Morale, MD

## 2016-08-27 NOTE — Progress Notes (Signed)
Pre visit review using our clinic review tool, if applicable. No additional management support is needed unless otherwise documented below in the visit note. 

## 2016-08-27 NOTE — Addendum Note (Signed)
Addended by: Aggie Hacker A on: 08/27/2016 11:56 AM   Modules accepted: Orders

## 2016-08-27 NOTE — Telephone Encounter (Signed)
Per Dr. Sarajane Jews order Flovent 110 mcg 2 puffs twice a day and I did send script e-scribe to [facility].

## 2016-08-30 LAB — URINE CULTURE

## 2016-09-11 DIAGNOSIS — C3411 Malignant neoplasm of upper lobe, right bronchus or lung: Secondary | ICD-10-CM | POA: Diagnosis not present

## 2016-09-11 DIAGNOSIS — C3490 Malignant neoplasm of unspecified part of unspecified bronchus or lung: Secondary | ICD-10-CM | POA: Diagnosis not present

## 2016-09-11 DIAGNOSIS — J701 Chronic and other pulmonary manifestations due to radiation: Secondary | ICD-10-CM | POA: Diagnosis not present

## 2016-09-12 DIAGNOSIS — C3411 Malignant neoplasm of upper lobe, right bronchus or lung: Secondary | ICD-10-CM | POA: Diagnosis not present

## 2016-09-19 ENCOUNTER — Encounter: Payer: Self-pay | Admitting: Adult Health

## 2016-09-19 ENCOUNTER — Ambulatory Visit (INDEPENDENT_AMBULATORY_CARE_PROVIDER_SITE_OTHER): Payer: Commercial Managed Care - HMO | Admitting: Adult Health

## 2016-09-19 VITALS — BP 120/80 | Ht 65.5 in | Wt 150.7 lb

## 2016-09-19 DIAGNOSIS — E538 Deficiency of other specified B group vitamins: Secondary | ICD-10-CM | POA: Diagnosis not present

## 2016-09-19 DIAGNOSIS — J209 Acute bronchitis, unspecified: Secondary | ICD-10-CM

## 2016-09-19 MED ORDER — CYANOCOBALAMIN 1000 MCG/ML IJ SOLN
1000.0000 ug | Freq: Once | INTRAMUSCULAR | Status: AC
  Administered 2016-09-19: 1000 ug via INTRAMUSCULAR

## 2016-09-19 MED ORDER — BENZONATATE 200 MG PO CAPS: 200.0000 mg | ORAL_CAPSULE | Freq: Three times a day (TID) | ORAL | 0 refills | Status: DC | PRN

## 2016-09-19 MED ORDER — AZITHROMYCIN 250 MG PO TABS: ORAL_TABLET | ORAL | 0 refills | Status: DC

## 2016-09-19 NOTE — Progress Notes (Signed)
Subjective:    Patient ID: [name], female    DOB: August 30, 1946, 70 y.o.   MRN: [id number]  HPI  70 year old female who presents to the office today for 5 days of chest tightness, semi productive sputum with yellow sputum, and wheezing. She has been using her inhalers as directed. She reports that she gets frequent respiratory infections.    Review of Systems  Constitutional: Negative.   HENT: Negative.   Respiratory: Positive for cough, chest tightness and wheezing.   Cardiovascular: Negative.   Musculoskeletal: Negative.   Neurological: Negative.   All other systems reviewed and are negative.  Past Medical History:  Diagnosis Date  . Anemia   . Cancer ([place])   . Cataract   . Clotting disorder ([place])   . Community acquired pneumonia 01/22/2013  . DVT (deep venous thrombosis) (HCC)    had 2 in right calf & 1 in the left calf  . Fibromyalgia   . Hyperlipidemia   . Hypertension   . Peripheral vascular disease ([place])   . PONV (postoperative nausea and vomiting)   . Squamous cell lung cancer [facility]) 2013   sees Dr. Elmon Kirschner at [facility] and Dr. Jaye Beagle at [place]   . Tobacco abuse     Social History   Social History  . Marital status: Married    Spouse name: Gwenlyn Perking  . Number of children: 3  . Years of education: N/A   Occupational History  . Retired Surveyor, minerals. Unemployed   Social History Main Topics  . Smoking status: Former Smoker    Quit date: 05/23/2012  . Smokeless tobacco: Never Used  . Alcohol use 0.0 oz/week     Comment: rare  . Drug use: No  . Sexual activity: No   Other Topics Concern  . Not on file   Social History Narrative   ** Merged History Encounter **       Married.  Lives with husband in [place].  Independent of ADLs.    Past Surgical History:  Procedure Laterality Date  . APPENDECTOMY    . CHOLECYSTECTOMY    . COLONOSCOPY     11/28/09 repeat in 10 years  . ESOPHAGOGASTRODUODENOSCOPY  October 2013   severe  esophagitis, done at [facility]   . KNEE ARTHROSCOPY     right  . LAMINECTOMY     lumbar  . PORTACATH PLACEMENT    . TONSILLECTOMY    . VIDEO BRONCHOSCOPY  05/30/2012   Procedure: VIDEO BRONCHOSCOPY WITH FLUORO;  Surgeon: Elsie Stain, MD;  Location: [facility];  Service: Cardiopulmonary;  Laterality: N/A;    Family History  Problem Relation Age of Onset  . Cancer Father     NHL  . Hypertension Mother     family history  . Atrial fibrillation Mother   . Cancer Mother     Lung  . Anemia      FE deficiency   . Arthritis      family history  . Cancer      colon 1st degree relative <60  . Multiple sclerosis Sister     Allergies  Allergen Reactions  . Zofran [Ondansetron Hcl]     Patient states she should not take Zofran due to her heart.  . Morphine And Related Other (See Comments)    Hard time waking up when taking morphine, codeine, and related medications    Current Outpatient Prescriptions on File Prior to Visit  Medication Sig Dispense Refill  .  acetaminophen (TYLENOL) 500 MG tablet Take 1,000 mg by mouth every 6 (six) hours as needed for mild pain.    Marland Kitchen albuterol (PROVENTIL HFA;VENTOLIN HFA) 108 (90 Base) MCG/ACT inhaler Inhale 2 puffs into the lungs every 4 (four) hours as needed for wheezing or shortness of breath. 1 Inhaler 11  . buPROPion (WELLBUTRIN XL) 300 MG 24 hr tablet Take 1 tablet (300 mg total) by mouth daily. 30 tablet 11  . ciprofloxacin (CIPRO) 500 MG tablet Take 1 tablet (500 mg total) by mouth 2 (two) times daily. 20 tablet 0  . fluticasone (FLOVENT HFA) 110 MCG/ACT inhaler Inhale 2 puffs into the lungs 2 (two) times daily. 2 puffs 1 Inhaler 6  . omeprazole (PRILOSEC) 20 MG capsule Take 20 mg by mouth 2 (two) times daily as needed.    . potassium chloride (KLOR-CON 10) 10 MEQ tablet Take 1 tablet (10 mEq total) by mouth daily. 90 tablet 3  . XARELTO 20 MG TABS tablet TAKE ONE TABLET BY MOUTH ONCE DAILY WITH SUPPER 90 tablet 3   No current  facility-administered medications on file prior to visit.     BP 120/80   Ht 5' 5.5" (1.664 m)   Wt 150 lb 11.2 oz (68.4 kg)   BMI 24.70 kg/m       Objective:   Physical Exam  Constitutional: She is oriented to person, place, and time. She appears well-developed and well-nourished. No distress.  HENT:  Head: Normocephalic and atraumatic.  Right Ear: External ear normal.  Left Ear: External ear normal.  Nose: Nose normal.  Mouth/Throat: Oropharynx is clear and moist. No oropharyngeal exudate.  Eyes: Conjunctivae and EOM are normal. Pupils are equal, round, and reactive to light. Right eye exhibits no discharge. Left eye exhibits no discharge. No scleral icterus.  Neck: Normal range of motion. Neck supple. No JVD present. No tracheal deviation present. No thyromegaly present.  Cardiovascular: Normal rate, regular rhythm, normal heart sounds and intact distal pulses.  Exam reveals no gallop and no friction rub.   No murmur heard. Pulmonary/Chest: Effort normal. No stridor. She has wheezes (full fields).  Lymphadenopathy:    She has no cervical adenopathy.  Neurological: She is alert and oriented to person, place, and time.  Skin: Skin is warm and dry. No rash noted. She is not diaphoretic. No erythema. No pallor.  Psychiatric: She has a normal mood and affect. Her behavior is normal. Judgment and thought content normal.  Nursing note and vitals reviewed.    Assessment & Plan:  1. B12 deficiency - cyanocobalamin ((VITAMIN B-12)) injection 1,000 mcg; Inject 1 mL (1,000 mcg total) into the muscle once.  2. Acute bronchitis, unspecified organism - azithromycin (ZITHROMAX Z-PAK) 250 MG tablet; Take 2 tablets on Day 1.  Then take 1 tablet daily.  Dispense: 6 tablet; Refill: 0 - benzonatate (TESSALON) 200 MG capsule; Take 1 capsule (200 mg total) by mouth 3 (three) times daily as needed for cough.  Dispense: 20 capsule; Refill: 0  Dorothyann Peng, NP

## 2016-11-12 ENCOUNTER — Encounter: Payer: Self-pay | Admitting: Family Medicine

## 2016-11-12 ENCOUNTER — Ambulatory Visit (INDEPENDENT_AMBULATORY_CARE_PROVIDER_SITE_OTHER): Payer: Commercial Managed Care - HMO | Admitting: Family Medicine

## 2016-11-12 VITALS — BP 114/89 | HR 89 | Temp 98.3°F | Ht 65.5 in | Wt 150.0 lb

## 2016-11-12 DIAGNOSIS — M25561 Pain in right knee: Secondary | ICD-10-CM | POA: Diagnosis not present

## 2016-11-12 DIAGNOSIS — E538 Deficiency of other specified B group vitamins: Secondary | ICD-10-CM

## 2016-11-12 DIAGNOSIS — Z86718 Personal history of other venous thrombosis and embolism: Secondary | ICD-10-CM | POA: Diagnosis not present

## 2016-11-12 MED ORDER — CYANOCOBALAMIN 1000 MCG/ML IJ SOLN
1000.0000 ug | Freq: Once | INTRAMUSCULAR | Status: AC
  Administered 2016-11-12: 1000 ug via INTRAMUSCULAR

## 2016-11-12 NOTE — Progress Notes (Signed)
Pre visit review using our clinic review tool, if applicable. No additional management support is needed unless otherwise documented below in the visit note. 

## 2016-11-12 NOTE — Progress Notes (Signed)
   Subjective:    Patient ID: [name], female    DOB: 1946-03-29, 70 y.o.   MRN: [id number]  HPI Here to check her right lower leg which has been mildly painful the past 2 days. The pain has been along the medial side of the lower leg, not in the calf. No swelling or redness or warmth. No recent trauma. No chest pain or SOB. She is concerned that she may have another DVT, although she remains on daily Xarelto.    Review of Systems  Constitutional: Negative.   Respiratory: Negative.   Cardiovascular: Negative.   Musculoskeletal: Positive for myalgias.  Neurological: Negative.        Objective:   Physical Exam  Constitutional: She is oriented to person, place, and time. She appears well-developed and well-nourished. No distress.  Cardiovascular: Normal rate, regular rhythm, normal heart sounds and intact distal pulses.   Pulmonary/Chest: Effort normal and breath sounds normal. No respiratory distress. She has no wheezes. She has no rales.  Musculoskeletal:  The right lower leg is mildly tender along the medial side but not in the calf. No cords are felt. No swelling in the leg or foot   Neurological: She is alert and oriented to person, place, and time.          Assessment & Plan:  This is a muscular pain and she most likely strained it over the weekend, possibly going up or down steps, for instance. This does not indicate another DVT so she was reassured. She will recheck if she notices any swelling in the leg or foot.  Laurey Morale, MD

## 2016-12-20 ENCOUNTER — Encounter: Payer: Self-pay | Admitting: Family Medicine

## 2016-12-20 DIAGNOSIS — Z1231 Encounter for screening mammogram for malignant neoplasm of breast: Secondary | ICD-10-CM | POA: Diagnosis not present

## 2016-12-24 ENCOUNTER — Emergency Department (HOSPITAL_COMMUNITY)
Admission: EM | Admit: 2016-12-24 | Discharge: 2016-12-24 | Disposition: A | Discharge status: Home / Self Care | Payer: Medicare HMO | Attending: Emergency Medicine | Admitting: Emergency Medicine

## 2016-12-24 ENCOUNTER — Encounter (HOSPITAL_COMMUNITY): Payer: Self-pay | Admitting: Emergency Medicine

## 2016-12-24 ENCOUNTER — Emergency Department (HOSPITAL_COMMUNITY): Payer: Medicare HMO

## 2016-12-24 DIAGNOSIS — Z85118 Personal history of other malignant neoplasm of bronchus and lung: Secondary | ICD-10-CM | POA: Insufficient documentation

## 2016-12-24 DIAGNOSIS — I10 Essential (primary) hypertension: Secondary | ICD-10-CM | POA: Diagnosis not present

## 2016-12-24 DIAGNOSIS — Z79899 Other long term (current) drug therapy: Secondary | ICD-10-CM | POA: Insufficient documentation

## 2016-12-24 DIAGNOSIS — Z7901 Long term (current) use of anticoagulants: Secondary | ICD-10-CM | POA: Diagnosis not present

## 2016-12-24 DIAGNOSIS — Z87891 Personal history of nicotine dependence: Secondary | ICD-10-CM | POA: Insufficient documentation

## 2016-12-24 DIAGNOSIS — M25512 Pain in left shoulder: Secondary | ICD-10-CM | POA: Insufficient documentation

## 2016-12-24 DIAGNOSIS — M549 Dorsalgia, unspecified: Secondary | ICD-10-CM | POA: Diagnosis not present

## 2016-12-24 DIAGNOSIS — R079 Chest pain, unspecified: Secondary | ICD-10-CM | POA: Diagnosis not present

## 2016-12-24 LAB — CBC
HCT: 45.1 % (ref 36.0–46.0)
HEMOGLOBIN: 15.6 g/dL — AB (ref 12.0–15.0)
MCH: 29.9 pg (ref 26.0–34.0)
MCHC: 34.6 g/dL (ref 30.0–36.0)
MCV: 86.4 fL (ref 78.0–100.0)
Platelets: 231 10*3/uL (ref 150–400)
RBC: 5.22 MIL/uL — AB (ref 3.87–5.11)
RDW: 13.5 % (ref 11.5–15.5)
WBC: 5.5 10*3/uL (ref 4.0–10.5)

## 2016-12-24 LAB — BASIC METABOLIC PANEL
ANION GAP: 12 (ref 5–15)
BUN: 12 mg/dL (ref 6–20)
CALCIUM: 10.1 mg/dL (ref 8.9–10.3)
CO2: 27 mmol/L (ref 22–32)
Chloride: 101 mmol/L (ref 101–111)
Creatinine, Ser: 0.91 mg/dL (ref 0.44–1.00)
GFR calc Af Amer: >60 mL/min (ref >60)
Glucose, Bld: 91 mg/dL (ref 65–99)
Potassium: 4.2 mmol/L (ref 3.5–5.1)
Sodium: 140 mmol/L (ref 135–145)

## 2016-12-24 LAB — I-STAT TROPONIN, ED: TROPONIN I, POC: 0 ng/mL (ref 0.00–0.08)

## 2016-12-24 MED ORDER — DICLOFENAC SODIUM 1 % TD GEL: 2.0000 g | Freq: Four times a day (QID) | TRANSDERMAL | 0 refills | Status: DC

## 2016-12-24 MED ORDER — IOPAMIDOL (ISOVUE-370) INJECTION 76%
100.0000 mL | Freq: Once | INTRAVENOUS | Status: AC | PRN
  Administered 2016-12-24: 100 mL via INTRAVENOUS

## 2016-12-24 MED ORDER — IOPAMIDOL (ISOVUE-370) INJECTION 76%
INTRAVENOUS | Status: AC
  Filled 2016-12-24: qty 100

## 2016-12-24 NOTE — ED Triage Notes (Signed)
Pt reports gradual onset of left shoulder pain and back pain last night during the night. Pt reports pain is contstant and unrelieved. Hx of PE and lung CA.

## 2016-12-24 NOTE — ED Provider Notes (Signed)
[facility] DEPT Provider Note   CSN: [id number] Arrival date & time: 12/24/16  1053     History   Chief Complaint Chief Complaint  Patient presents with  . Shoulder Pain    HPI [name] is a 71 y.o. female.  Patient is a 71 year old female with a history of PE, DVT currently on Xarelto, cancer and tobacco abuse presenting with pain in the right arm and back that started last night and persists. She cannot recall any trauma or repetitive movements. Moving her arm does not make it worse. No new shortness of breath, fever or cough. Last PE was 4-5 years ago. However she states she got them while on anticoagulation. She denies any recent surgeries. No recent medication changes.   The history is provided by the patient.    Past Medical History:  Diagnosis Date  . Anemia   . Cancer ([place])   . Cataract   . Clotting disorder ([place])   . Community acquired pneumonia 01/22/2013  . DVT (deep venous thrombosis) (HCC)    had 2 in right calf & 1 in the left calf  . Fibromyalgia   . Hyperlipidemia   . Hypertension   . Peripheral vascular disease ([place])   . PONV (postoperative nausea and vomiting)   . Squamous cell lung cancer [facility]) 2013   sees Dr. Elmon Kirschner at [facility] and Dr. Jaye Beagle at [place]   . Tobacco abuse     Patient Active Problem List   Diagnosis Date Noted  . Other pulmonary embolism and infarction 01/29/2013  . Enterococcus UTI 01/24/2013  . Severe malnutrition in the context of acute illness 01/23/2013  . Acute pulmonary emboli, bilateral, with shortness of breath and back pain 01/22/2013  . Community acquired pneumonia 01/22/2013  . Non-small cell lung cancer status post chemotherapy and radiation therapy 01/22/2013  . Squamous cell lung cancer ([place]) 08/20/2012  . Lung mass 05/27/2012  . Hypokalemia 05/27/2012  . Diplopia 05/26/2012  . DVT (deep venous thrombosis) ([place]) 05/23/2011  . SHOULDER PAIN 12/21/2010  . PARONYCHIA  12/05/2010  . FOOT PAIN 04/18/2010  . UTI 12/26/2009  . BACK PAIN, LUMBAR 12/26/2009  . COAGULOPATHY, COUMADIN-INDUCED 10/06/2009  . B12 deficiency 09/27/2009  . HYPERLIPIDEMIA 09/13/2009  . Anemia, unspecified 09/13/2009  . GLAUCOMA 09/13/2009  . Essential hypertension 09/13/2009  . DEEP VENOUS THROMBOPHLEBITIS, RECURRENT 09/13/2009  . GANGLION CYST 09/13/2009  . DVT, HX OF 09/13/2009    Past Surgical History:  Procedure Laterality Date  . APPENDECTOMY    . CHOLECYSTECTOMY    . COLONOSCOPY     11/28/09 repeat in 10 years  . ESOPHAGOGASTRODUODENOSCOPY  October 2013   severe esophagitis, done at [facility]   . KNEE ARTHROSCOPY     right  . LAMINECTOMY     lumbar  . PORTACATH PLACEMENT    . TONSILLECTOMY    . VIDEO BRONCHOSCOPY  05/30/2012   Procedure: VIDEO BRONCHOSCOPY WITH FLUORO;  Surgeon: Elsie Stain, MD;  Location: [facility];  Service: Cardiopulmonary;  Laterality: N/A;    OB History    Gravida Para Term Preterm AB Living   0 0 0 0 0     SAB TAB Ectopic Multiple Live Births   0 0 0           Home Medications    Prior to Admission medications   Medication Sig Start Date End Date Taking? Authorizing Provider  acetaminophen (TYLENOL) 500 MG tablet Take 1,000 mg by mouth every  6 (six) hours as needed for mild pain.    Historical Provider, MD  albuterol (PROVENTIL HFA;VENTOLIN HFA) 108 (90 Base) MCG/ACT inhaler Inhale 2 puffs into the lungs every 4 (four) hours as needed for wheezing or shortness of breath. 08/27/16   Laurey Morale, MD  buPROPion (WELLBUTRIN XL) 300 MG 24 hr tablet Take 1 tablet (300 mg total) by mouth daily. 07/04/16   Laurey Morale, MD  fluticasone (FLOVENT HFA) 110 MCG/ACT inhaler Inhale 2 puffs into the lungs 2 (two) times daily. 2 puffs 08/27/16   Laurey Morale, MD  omeprazole (PRILOSEC) 20 MG capsule Take 20 mg by mouth 2 (two) times daily as needed. 10/02/12   Laurey Morale, MD  potassium chloride (KLOR-CON 10) 10 MEQ tablet Take 1 tablet (10 mEq  total) by mouth daily. 10/28/15   Laurey Morale, MD  XARELTO 20 MG TABS tablet TAKE ONE TABLET BY MOUTH ONCE DAILY WITH SUPPER 03/19/16   Laurey Morale, MD    Family History Family History  Problem Relation Age of Onset  . Cancer Father     NHL  . Hypertension Mother     family history  . Atrial fibrillation Mother   . Cancer Mother     Lung  . Anemia      FE deficiency   . Arthritis      family history  . Cancer      colon 1st degree relative <60  . Multiple sclerosis Sister     Social History Social History  Substance Use Topics  . Smoking status: Former Smoker    Quit date: 05/23/2012  . Smokeless tobacco: Never Used  . Alcohol use 0.0 oz/week     Comment: rare     Allergies   Zofran [ondansetron hcl] and Morphine and related   Review of Systems Review of Systems  All other systems reviewed and are negative.    Physical Exam Updated Vital Signs BP 121/91   Pulse 80   Temp 98 F (36.7 C) (Oral)   Resp 19   SpO2 100%   Physical Exam  Constitutional: She is oriented to person, place, and time. She appears well-developed and well-nourished. No distress.  HENT:  Head: Normocephalic and atraumatic.  Mouth/Throat: Oropharynx is clear and moist.  Eyes: Conjunctivae and EOM are normal. Pupils are equal, round, and reactive to light.  Neck: Normal range of motion. Neck supple.  Cardiovascular: Regular rhythm and intact distal pulses.  Tachycardia present.   No murmur heard. Pulses equal in bilateral upper extremities  Pulmonary/Chest: Effort normal and breath sounds normal. No respiratory distress. She has no wheezes. She has no rales.  Abdominal: Soft. She exhibits no distension. There is no tenderness. There is no rebound and no guarding.  Musculoskeletal: Normal range of motion. She exhibits no edema or tenderness.       Thoracic back: She exhibits pain and spasm. She exhibits normal range of motion.       Back:  Neurological: She is alert and oriented to  person, place, and time.  Skin: Skin is warm and dry. No rash noted. No erythema.  Psychiatric: She has a normal mood and affect. Her behavior is normal.  Nursing note and vitals reviewed.    ED Treatments / Results  Labs (all labs ordered are listed, but only abnormal results are displayed) Labs Reviewed  CBC - Abnormal; Notable for the following:       Result Value   RBC 5.22 (*)  Hemoglobin 15.6 (*)    All other components within normal limits  BASIC METABOLIC PANEL  I-STAT TROPOININ, ED    EKG  EKG Interpretation  Date/Time:  Monday December 24 2016 11:52:38 EST Ventricular Rate:  97 PR Interval:  150 QRS Duration: 74 QT Interval:  338 QTC Calculation: 429 R Axis:   70 Text Interpretation:  Normal sinus rhythm Normal ECG No significant change since last tracing Confirmed by Maryan Rued  MD, Loree Fee ([id number]) on 12/24/2016 1:41:58 PM       Radiology Dg Chest 2 View  Result Date: 12/24/2016 CLINICAL DATA:  Pt reports gradual onset of left shoulder pain and back pain last night during the night. Pt reports pain is contstant and unrelieved. Hx of PE and lung CA. EXAM: CHEST  2 VIEW COMPARISON:  04/23/2016 FINDINGS: Mild hyperinflation.  Moderate thoracic spondylosis. Patient rotated minimally to the right on the frontal. Tracheal deviation to the right secondary to volume loss in the right hemithorax. Right hemidiaphragm elevation. No pleural effusion or pneumothorax. Stable heart size. Similar right perihilar presumed radiation fibrosis with architectural distortion and partial opacification. Clear left lung. No superimposed consolidation. Cholecystectomy. IMPRESSION: Similar appearance of the right hemithorax, with volume loss and parahilar architectural distortion, likely radiation induced. No acute superimposed process. Electronically Signed   By: Abigail Miyamoto M.D.   On: 12/24/2016 12:39   Ct Angio Chest Pe W Or Wo Contrast  Result Date: 12/24/2016 CLINICAL DATA:  Left  shoulder and back pain. EXAM: CT ANGIOGRAPHY CHEST WITH CONTRAST TECHNIQUE: Multidetector CT imaging of the chest was performed using the standard protocol during bolus administration of intravenous contrast. Multiplanar CT image reconstructions and MIPs were obtained to evaluate the vascular anatomy. CONTRAST:  100 mL of Isovue 370 intravenously. COMPARISON:  CT scan of May 07, 2015. FINDINGS: Cardiovascular: Atherosclerosis of thoracic aorta is noted without aneurysm formation. There is no definite evidence of pulmonary embolus. Normal cardiac size. Mediastinum/Nodes: 11 mm subcarinal lymph node is noted. Lungs/Pleura: No pneumothorax or pleural effusion is noted. Left lung is clear. Stable right infrahilar radiation fibrosis is noted. Probable atelectasis of right middle lobe is noted; endobronchial obstruction cannot be excluded. Upper Abdomen: No acute abnormality. Musculoskeletal: No chest wall abnormality. No acute or significant osseous findings. Review of the MIP images confirms the above findings. IMPRESSION: Aortic atherosclerosis. No definite evidence of pulmonary embolus. 11 mm subcarinal lymph node is noted. Metastatic disease cannot be excluded. Right middle lobe atelectasis is noted. Obstruction due to endobronchial lesion cannot be excluded, and further evaluation with bronchoscopy is recommended. Electronically Signed   By: Marijo Conception, M.D.   On: 12/24/2016 15:34    Procedures Procedures (including critical care time)  Medications Ordered in ED Medications  iopamidol (ISOVUE-370) 76 % injection (not administered)     Initial Impression / Assessment and Plan / ED Course  I have reviewed the triage vital signs and the nursing notes.  Pertinent labs & imaging results that were available during my care of the patient were reviewed by me and considered in my medical decision making (see chart for details).    Patient presenting today with left shoulder and back pain that started  last night. It is a constant type pain that's throbbing in nature and about a 6 out of 10. She was unable to sleep last night because of the discomfort. She denies any trauma and states in the past when she's had PEs they have been similar. No recent procedures and she still  taking anticoagulation. She does have a history of lung cancer but was on the right. She has no cardiac history. On exam patient does have some trapezius spasm and tenderness but no other acute findings. Chest x-ray, CBC, CMP, troponin are all within normal limits. EKG within normal limits. Will do a CT PE scan to rule out recurrent clot. Patient did note that she had right lower leg pain last week but that is intermittent and has gone on for years.  3:52 PM CT showed no evidence of PE. She does have an 11 mm subcarinal lymph node as well as a right middle lobe atelectasis. However these things are unknown to the patient. She will call her oncologist just to let them know about the lymph node. She has close follow-up. Patient treated for musculoskeletal pain. She will take Tylenol and use Voltaren gel.  Final Clinical Impressions(s) / ED Diagnoses   Final diagnoses:  Acute pain of left shoulder    New Prescriptions New Prescriptions   DICLOFENAC SODIUM (VOLTAREN) 1 % GEL    Apply 2 g topically 4 (four) times daily.     Blanchie Dessert, MD 12/24/16 1553

## 2016-12-25 ENCOUNTER — Encounter: Payer: Self-pay | Admitting: Family Medicine

## 2016-12-25 DIAGNOSIS — R922 Inconclusive mammogram: Secondary | ICD-10-CM | POA: Diagnosis not present

## 2017-01-16 ENCOUNTER — Ambulatory Visit: Payer: Commercial Managed Care - HMO

## 2017-01-17 ENCOUNTER — Telehealth: Payer: Self-pay | Admitting: Family Medicine

## 2017-01-17 ENCOUNTER — Ambulatory Visit (INDEPENDENT_AMBULATORY_CARE_PROVIDER_SITE_OTHER): Payer: Medicare HMO

## 2017-01-17 DIAGNOSIS — E538 Deficiency of other specified B group vitamins: Secondary | ICD-10-CM | POA: Diagnosis not present

## 2017-01-17 MED ORDER — CYANOCOBALAMIN 1000 MCG/ML IJ SOLN
1000.0000 ug | Freq: Once | INTRAMUSCULAR | Status: AC
  Administered 2017-01-17: 1000 ug via INTRAMUSCULAR

## 2017-01-17 NOTE — Telephone Encounter (Signed)
Pt would like for you to fax the lung xray that you have to [facility] fax [phone] Attn to:  Wolfgang Phoenix

## 2017-01-18 NOTE — Telephone Encounter (Signed)
CT and CXR routed to Dr Truman Hayward @ [facility].

## 2017-01-22 NOTE — Telephone Encounter (Signed)
Spoke with [facility] @ [facility] and they do have results via [place]. Nothing further needed at this time.

## 2017-01-22 NOTE — Telephone Encounter (Addendum)
Pt state that the [facility] did not receive the CT or CXR can you please resend this to the Attn:  Wolfgang Phoenix to the fax # [phone].  Pt is very concerned about this.

## 2017-01-22 NOTE — Telephone Encounter (Signed)
Please take care of this.  

## 2017-01-31 ENCOUNTER — Ambulatory Visit (INDEPENDENT_AMBULATORY_CARE_PROVIDER_SITE_OTHER): Payer: Medicare HMO | Admitting: Family Medicine

## 2017-01-31 ENCOUNTER — Encounter: Payer: Self-pay | Admitting: Family Medicine

## 2017-01-31 VITALS — BP 116/80 | HR 109 | Temp 98.2°F | Resp 12 | Ht 65.5 in | Wt 147.4 lb

## 2017-01-31 DIAGNOSIS — M549 Dorsalgia, unspecified: Secondary | ICD-10-CM

## 2017-01-31 DIAGNOSIS — R05 Cough: Secondary | ICD-10-CM | POA: Diagnosis not present

## 2017-01-31 DIAGNOSIS — Z86711 Personal history of pulmonary embolism: Secondary | ICD-10-CM | POA: Diagnosis not present

## 2017-01-31 DIAGNOSIS — J989 Respiratory disorder, unspecified: Secondary | ICD-10-CM

## 2017-01-31 DIAGNOSIS — R059 Cough, unspecified: Secondary | ICD-10-CM

## 2017-01-31 DIAGNOSIS — R0989 Other specified symptoms and signs involving the circulatory and respiratory systems: Secondary | ICD-10-CM

## 2017-01-31 DIAGNOSIS — K219 Gastro-esophageal reflux disease without esophagitis: Secondary | ICD-10-CM

## 2017-01-31 MED ORDER — IPRATROPIUM-ALBUTEROL 0.5-2.5 (3) MG/3ML IN SOLN
3.0000 mL | Freq: Once | RESPIRATORY_TRACT | Status: AC
  Administered 2017-01-31: 3 mL via RESPIRATORY_TRACT

## 2017-01-31 MED ORDER — DOXYCYCLINE HYCLATE 100 MG PO TABS: 100.0000 mg | ORAL_TABLET | Freq: Two times a day (BID) | ORAL | 0 refills | Status: AC

## 2017-01-31 NOTE — Progress Notes (Signed)
HPI:  ACUTE VISIT  Chief Complaint  Patient presents with  . Cough    [name] is a 71 y.o.female here today complaining of 3 days of worsening cough.  Mostly non productive but sometimes she feels mucus but she cannot bring up.  She is reporting Hx of cough for about 5 years, since she was Dx with lung cancer. Cough has been worse for the past 3 days. She feels like burning sensation on mid chest, retrosternal, and mucus coming down from throat.  She is concerned about pneumonia or PE. Hx of PE,she is on Xarelto. Denies palpitations,dyspnea,or wheezing. She denies LE edema or erythema.     Cough  This is a recurrent problem. The current episode started more than 1 year ago. The problem has been gradually worsening. The problem occurs constantly. The cough is non-productive. Associated symptoms include heartburn. Pertinent negatives include no chills, fever, headaches, hemoptysis, rash, rhinorrhea, sore throat, shortness of breath or wheezing. Nothing aggravates the symptoms. Risk factors for lung disease include smoking/tobacco exposure. She has tried nothing for the symptoms. The treatment provided moderate relief. Her past medical history is significant for pneumonia. There is no history of environmental allergies.   She has no clear Hx of COPD, she is on Flovent 2 puff bid and Albuterol inh as needed.She has not needed the latter one in a while.   + Fatigue for about a month, has been intermittent for a while.  No nasal congestion, rhinorrhea, sore throat, and post nasal drainage. No fever,chills, and/or body aches.   No Hx of recent travel. No sick contact. No known insect bite. Symptoms otherwise stable.  Hx of GERD, she takes Omeprazole as needed. Denies abdominal pain, nausea, vomiting, changes in bowel habits, blood in stool or melena.  -Back pain: Since 11/2016 she has had pain on right thoracic and lateral  pain, mainly under axilla, she  can pinpoint affected area. Pain is interfering with sleep.   Constant aching, moderate.  Pain is exacerbated by lying flat in bed. Tylenol does not help much.  She has not noted rash,tingling,or numbness.  She states that she was in the ER 11/2016, chest CTA was done and it was a question about a new lung lesion, waiting on her oncologists to review imaging and determine if in fact there is a new finding. Hx of right lung cancer Dx about 5 years ago, has appt with oncologist in [place] in 02/2017.    Review of Systems  Constitutional: Positive for fatigue. Negative for activity change, appetite change, chills and fever.  HENT: Positive for congestion. Negative for mouth sores, rhinorrhea, sinus pressure, sore throat, trouble swallowing and voice change.   Respiratory: Positive for cough. Negative for hemoptysis, shortness of breath and wheezing.   Cardiovascular: Negative for palpitations and leg swelling.  Gastrointestinal: Positive for heartburn. Negative for abdominal pain, nausea and vomiting.       No changes in bowel habits.  Musculoskeletal: Positive for back pain. Negative for gait problem and neck pain.  Skin: Negative for color change and rash.  Allergic/Immunologic: Negative for environmental allergies.  Neurological: Negative for weakness, numbness and headaches.  Hematological: Negative for adenopathy. Does not bruise/bleed easily.  Psychiatric/Behavioral: Positive for sleep disturbance. Negative for confusion. The patient is nervous/anxious.       Current Outpatient Prescriptions on File Prior to Visit  Medication Sig Dispense Refill  . acetaminophen (TYLENOL) 500 MG tablet Take 1,000 mg by mouth every  6 (six) hours as needed for mild pain.    Marland Kitchen albuterol (PROVENTIL HFA;VENTOLIN HFA) 108 (90 Base) MCG/ACT inhaler Inhale 2 puffs into the lungs every 4 (four) hours as needed for wheezing or shortness of breath. 1 Inhaler 11  . buPROPion (WELLBUTRIN XL) 300 MG 24 hr  tablet Take 1 tablet (300 mg total) by mouth daily. 30 tablet 11  . diclofenac sodium (VOLTAREN) 1 % GEL Apply 2 g topically 4 (four) times daily. 100 g 0  . fluticasone (FLOVENT HFA) 110 MCG/ACT inhaler Inhale 2 puffs into the lungs 2 (two) times daily. 2 puffs 1 Inhaler 6  . omeprazole (PRILOSEC) 20 MG capsule Take 20 mg by mouth 2 (two) times daily as needed.    . potassium chloride (KLOR-CON 10) 10 MEQ tablet Take 1 tablet (10 mEq total) by mouth daily. 90 tablet 3  . XARELTO 20 MG TABS tablet TAKE ONE TABLET BY MOUTH ONCE DAILY WITH SUPPER 90 tablet 3   No current facility-administered medications on file prior to visit.      Past Medical History:  Diagnosis Date  . Anemia   . Cancer ([place])   . Cataract   . Clotting disorder ([place])   . Community acquired pneumonia 01/22/2013  . DVT (deep venous thrombosis) (HCC)    had 2 in right calf & 1 in the left calf  . Fibromyalgia   . Hyperlipidemia   . Hypertension   . Peripheral vascular disease ([place])   . PONV (postoperative nausea and vomiting)   . Squamous cell lung cancer [facility]) 2013   sees Dr. Elmon [facility]schner at [facility] and Dr. Jaye Beagle at [place]   . Tobacco abuse    Allergies  Allergen Reactions  . Zofran [Ondansetron Hcl]     Patient states she should not take Zofran due to her heart.  . Morphine And Related Other (See Comments)    Hard time waking up when taking morphine, codeine, and related medications    Social History   Social History  . Marital status: Married    Spouse name: Gwenlyn Perking  . Number of children: 3  . Years of education: N/A   Occupational History  . Retired Surveyor, minerals. Unemployed   Social History Main Topics  . Smoking status: Former Smoker    Quit date: 05/23/2012  . Smokeless tobacco: Never Used  . Alcohol use 0.0 oz/week     Comment: rare  . Drug use: No  . Sexual activity: No   Other Topics Concern  . None   Social History Narrative   ** Merged History Encounter **        Married.  Lives with husband in [place].  Independent of ADLs.    Vitals:   01/31/17 1450  BP: 116/80  Pulse: (!) 109  Resp: 12  Temp: 98.2 F (36.8 C)  O2 sat at RA 95% Body mass index is 24.15 kg/m.   Physical Exam  Nursing note and vitals reviewed. Constitutional: She is oriented to person, place, and time. She appears well-developed and well-nourished. She does not appear ill. No distress.  HENT:  Head: Atraumatic.  Mouth/Throat: Oropharynx is clear and moist and mucous membranes are normal. She has dentures.  Eyes: Conjunctivae and EOM are normal.  Neck: No muscular tenderness present.  Cardiovascular: Regular rhythm.  Tachycardia present.   No murmur heard. Respiratory: Effort normal. No stridor. No respiratory distress. She has wheezes (With force expiration.). She has no rales.  A few episodes  of cough during OV.   Musculoskeletal: She exhibits no edema.  Pain upon palpation of right interscapular area and lateral aspect of right thorax: Between middle axillary and ant axillar lines.  No skin changes, local edema, or deformities appreciated.  Lymphadenopathy:    She has no cervical adenopathy.  Neurological: She is alert and oriented to person, place, and time. She has normal strength.  Skin: Skin is warm. No rash noted. No erythema.  Psychiatric: Her speech is normal. Her mood appears anxious.  Well groomed, good eye contact.      ASSESSMENT AND PLAN:   [name] was seen today for cough.  Diagnoses and all orders for this visit:  Cough  We discussed possible etiologies:Allergies,URI,GERD,COPD among some.  Reactive airway disease that is not asthma  Wheezing improved after Duoneb neb treatment. Minimal course noise right base,clear with cough, no rales. Albuterol inh 2 puff every 6 hours for a week then as needed for wheezing or shortness of breath.  We agree with holding on imaging for now. Abx sent to pharmacy and instructed to start in 3-4 days if  not any better,before if she gets worse. Instructed about warning signs. F/U in 2 weeks with PCP,before if needed.  -     ipratropium-albuterol (DUONEB) 0.5-2.5 (3) MG/3ML nebulizer solution 3 mL; Take 3 mLs by nebulization once. -     doxycycline (VIBRA-TABS) 100 MG tablet; Take 1 tablet (100 mg total) by mouth 2 (two) times daily.  Upper back pain on right side  Seems musculoskeletal. Monitor for new symptoms. She prefers not to try muscle relaxants because possible side effects. OTC Lidocaine path may help. F/U in 2 weeks with PCP.  Gastroesophageal reflux disease, esophagitis presence not specified  Could be causing/aggravating cough. If not better in 1-2 weeks she may want to consider daily Omeprazole. GERD precautions discussed.   Personal history of pulmonary embolism  I do not think symptoms reported today + examination suggest PE.She is on oral anticoagulation. She tells me that she knows it is not PE but wanted to make me aware of her PMHx. Clearly instructed about warning signs.    -[name] advised to return or notify a doctor immediately if symptoms worsen or persist or new concerns arise.       Lydia Aldredge G. Martinique, MD  [facility] [place] office.

## 2017-01-31 NOTE — Progress Notes (Signed)
Pre visit review using our clinic review tool, if applicable. No additional management support is needed unless otherwise documented below in the visit note. 

## 2017-01-31 NOTE — Patient Instructions (Addendum)
  [name] I have seen you today for an acute visit.  A few things to remember from today's visit:   Cough  Reactive airway disease that is not asthma - Plan: ipratropium-albuterol (DUONEB) 0.5-2.5 (3) MG/3ML nebulizer solution 3 mL, doxycycline (VIBRA-TABS) 100 MG tablet  Upper back pain on right side  Gastroesophageal reflux disease, esophagitis presence not specified  Personal history of pulmonary embolism  Albuterol inh 2 puff every 6 hours for a week then as needed for wheezing or shortness of breath.    Over the counter Lidocaine patch 4% may help.  Viral infections DO NOT respond to antibiotic treatments and DO NOT prevent for bacterial over infection.    If you are not feeling ANY better in 3-4 days you can call me and we may consider antibiotic treatment ,even thought viral illness can take longer to resolve.    Avoid foods that make your symptoms worse, for example coffee, chocolate,pepermeint,alcohol, and greasy food. Raising the head of your bed about 6 inches may help with nocturnal symptoms.    Avoid lying down for 3 hours after eating.  Instead 3 large meals daily try small and more frequent meals during the day.   You should be evaluated immediately if bloody vomiting, bloody stools, black stools (like tar), difficulty swallowing, food gets stuck on the way down or choking when eating. Abnormal weight loss or severe abdominal pain.       In general please monitor for signs of worsening symptoms and seek immediate medical attention if any concerning.   Please be sure you have an appointment already scheduled with your PCP before you leave today.

## 2017-02-02 ENCOUNTER — Ambulatory Visit (INDEPENDENT_AMBULATORY_CARE_PROVIDER_SITE_OTHER): Payer: Medicare HMO | Admitting: Family

## 2017-02-02 ENCOUNTER — Encounter: Payer: Self-pay | Admitting: Family

## 2017-02-02 VITALS — BP 120/80 | HR 88 | Temp 97.8°F | Ht 65.5 in | Wt 147.0 lb

## 2017-02-02 DIAGNOSIS — J020 Streptococcal pharyngitis: Secondary | ICD-10-CM

## 2017-02-02 LAB — POCT RAPID STREP A (OFFICE): Rapid Strep A Screen: POSITIVE — AB

## 2017-02-02 MED ORDER — CEFDINIR 300 MG PO CAPS: 300.0000 mg | ORAL_CAPSULE | Freq: Two times a day (BID) | ORAL | 0 refills | Status: DC

## 2017-02-02 NOTE — Progress Notes (Signed)
Pre visit review using our clinic review tool, if applicable. No additional management support is needed unless otherwise documented below in the visit note. 

## 2017-02-02 NOTE — Progress Notes (Signed)
Subjective:    Patient ID: [name], female    DOB: 02/06/1946, 71 y.o.   MRN: [id number]  CC: [name] is a 71 y.o. female who presents today for an acute visit.    HPI: CC: Here today for strep exposure. Has been kissing grandson with strep. No sore throat.  Fever last night tmax 100.1.  No sinus congestion, ear pain.   Given doxycycline for cough yesterday; hasn't started medication. Using inhaler every 6 hours and cough, wheezing has improved.   Exposure to strep.  H/o lung cancer , PE ( on xarelto)        HISTORY:  Past Medical History:  Diagnosis Date  . Anemia   . Cancer ([place])   . Cataract   . Clotting disorder ([place])   . Community acquired pneumonia 01/22/2013  . DVT (deep venous thrombosis) (HCC)    had 2 in right calf & 1 in the left calf  . Fibromyalgia   . Hyperlipidemia   . Hypertension   . Peripheral vascular disease ([place])   . PONV (postoperative nausea and vomiting)   . Squamous cell lung cancer [facility]) 2013   sees Dr. Elmon Kirschner at [facility] and Dr. Jaye Beagle at [place]   . Tobacco abuse    Past Surgical History:  Procedure Laterality Date  . APPENDECTOMY    . CHOLECYSTECTOMY    . COLONOSCOPY     11/28/09 repeat in 10 years  . ESOPHAGOGASTRODUODENOSCOPY  October 2013   severe esophagitis, done at [facility]   . KNEE ARTHROSCOPY     right  . LAMINECTOMY     lumbar  . PORTACATH PLACEMENT    . TONSILLECTOMY    . VIDEO BRONCHOSCOPY  05/30/2012   Procedure: VIDEO BRONCHOSCOPY WITH FLUORO;  Surgeon: Elsie Stain, MD;  Location: [place];  Service: Cardiopulmonary;  Laterality: N/A;   Family History  Problem Relation Age of Onset  . Cancer Father     NHL  . Hypertension Mother     family history  . Atrial fibrillation Mother   . Cancer Mother     Lung  . Anemia      FE deficiency   . Arthritis      family history  . Cancer      colon 1st degree relative <60  . Multiple sclerosis Sister      Allergies: Zofran [ondansetron hcl] and Morphine and related Current Outpatient Prescriptions on File Prior to Visit  Medication Sig Dispense Refill  . acetaminophen (TYLENOL) 500 MG tablet Take 1,000 mg by mouth every 6 (six) hours as needed for mild pain.    Marland Kitchen albuterol (PROVENTIL HFA;VENTOLIN HFA) 108 (90 Base) MCG/ACT inhaler Inhale 2 puffs into the lungs every 4 (four) hours as needed for wheezing or shortness of breath. 1 Inhaler 11  . buPROPion (WELLBUTRIN XL) 300 MG 24 hr tablet Take 1 tablet (300 mg total) by mouth daily. 30 tablet 11  . diclofenac sodium (VOLTAREN) 1 % GEL Apply 2 g topically 4 (four) times daily. 100 g 0  . doxycycline (VIBRA-TABS) 100 MG tablet Take 1 tablet (100 mg total) by mouth 2 (two) times daily. 14 tablet 0  . fluticasone (FLOVENT HFA) 110 MCG/ACT inhaler Inhale 2 puffs into the lungs 2 (two) times daily. 2 puffs 1 Inhaler 6  . omeprazole (PRILOSEC) 20 MG capsule Take 20 mg by mouth 2 (two) times daily as needed.    . potassium chloride (KLOR-CON  10) 10 MEQ tablet Take 1 tablet (10 mEq total) by mouth daily. 90 tablet 3  . XARELTO 20 MG TABS tablet TAKE ONE TABLET BY MOUTH ONCE DAILY WITH SUPPER 90 tablet 3   No current facility-administered medications on file prior to visit.     Social History  Substance Use Topics  . Smoking status: Former Smoker    Quit date: 05/23/2012  . Smokeless tobacco: Never Used  . Alcohol use 0.0 oz/week     Comment: rare    Review of Systems  Constitutional: Negative for chills and fever.  HENT: Negative for congestion, sinus pressure, sore throat and trouble swallowing.   Respiratory: Positive for cough and wheezing. Negative for shortness of breath.   Cardiovascular: Negative for chest pain and palpitations.  Gastrointestinal: Negative for nausea and vomiting.      Objective:    BP 120/80 (BP Location: Right Arm, Patient Position: Sitting, Cuff Size: Normal)   Pulse 88   Temp 97.8 F (36.6 C) (Oral)   Ht  5' 5.5" (1.664 m)   Wt 147 lb (66.7 kg)   SpO2 95%   BMI 24.09 kg/m    Physical Exam  Constitutional: She appears well-developed and well-nourished.  HENT:  Head: Normocephalic and atraumatic.  Right Ear: Hearing, tympanic membrane, external ear and ear canal normal. No drainage, swelling or tenderness. No foreign bodies. Tympanic membrane is not erythematous and not bulging. No middle ear effusion. No decreased hearing is noted.  Left Ear: Hearing, tympanic membrane, external ear and ear canal normal. No drainage, swelling or tenderness. No foreign bodies. Tympanic membrane is not erythematous and not bulging.  No middle ear effusion. No decreased hearing is noted.  Nose: Nose normal. No rhinorrhea. Right sinus exhibits no maxillary sinus tenderness and no frontal sinus tenderness. Left sinus exhibits no maxillary sinus tenderness and no frontal sinus tenderness.  Mouth/Throat: Uvula is midline, oropharynx is clear and moist and mucous membranes are normal. No oropharyngeal exudate, posterior oropharyngeal edema, posterior oropharyngeal erythema or tonsillar abscesses.  Eyes: Conjunctivae are normal.  Cardiovascular: Regular rhythm, normal heart sounds and normal pulses.   Pulmonary/Chest: Effort normal and breath sounds normal. She has no wheezes. She has no rhonchi. She has no rales.  Lymphadenopathy:       Head (right side): No submental, no submandibular, no tonsillar, no preauricular, no posterior auricular and no occipital adenopathy present.       Head (left side): No submental, no submandibular, no tonsillar, no preauricular, no posterior auricular and no occipital adenopathy present.    She has no cervical adenopathy.  Neurological: She is alert.  Skin: Skin is warm and dry.  Psychiatric: She has a normal mood and affect. Her speech is normal and behavior is normal. Thought content normal.  Vitals reviewed.      Assessment & Plan:   1. Strep pharyngitis Positive strep. sa02  95. Patient well appearing. H/o lung cancer. Decided to use agent that would also cover lower respiratory tract infections due to cancer as well as strep. Advised to not take doxycycline. Probiotics. Return precautions given.   - POCT rapid strep A - cefdinir (OMNICEF) 300 MG capsule; Take 1 capsule (300 mg total) by mouth 2 (two) times daily.  Dispense: 20 capsule; Refill: 0    I am having Ms. Pates maintain her omeprazole, acetaminophen, potassium chloride, XARELTO, buPROPion, albuterol, fluticasone, diclofenac sodium, and doxycycline.   No orders of the defined types were placed in this encounter.   Return  precautions given.   Risks, benefits, and alternatives of the medications and treatment plan prescribed today were discussed, and patient expressed understanding.   Education regarding symptom management and diagnosis given to patient on AVS.  Continue to follow with Alysia Penna, MD for routine health maintenance.   [name] and I agreed with plan.   Mable Paris, FNP

## 2017-02-02 NOTE — Patient Instructions (Addendum)
Do not take doxycycline.  Start cefdinir.   Start probiotics.    If there is no improvement in your symptoms, or if there is any worsening of symptoms, or if you have any additional concerns, please return for re-evaluation; or, if we are closed, consider going to the Emergency Room for evaluation if symptoms urgent.  Strep Throat Strep throat is a bacterial infection of the throat. Your health care provider may call the infection tonsillitis or pharyngitis, depending on whether there is swelling in the tonsils or at the back of the throat. Strep throat is most common during the cold months of the year in children who are 64-47 years of age, but it can happen during any season in people of any age. This infection is spread from person to person (contagious) through coughing, sneezing, or close contact. What are the causes? Strep throat is caused by the bacteria called Streptococcus pyogenes. What increases the risk? This condition is more likely to develop in:  People who spend time in crowded places where the infection can spread easily.  People who have close contact with someone who has strep throat. What are the signs or symptoms? Symptoms of this condition include:  Fever or chills.  Redness, swelling, or pain in the tonsils or throat.  Pain or difficulty when swallowing.  White or yellow spots on the tonsils or throat.  Swollen, tender glands in the neck or under the jaw.  Red rash all over the body (rare). How is this diagnosed? This condition is diagnosed by performing a rapid strep test or by taking a swab of your throat (throat culture test). Results from a rapid strep test are usually ready in a few minutes, but throat culture test results are available after one or two days. How is this treated? This condition is treated with antibiotic medicine. Follow these instructions at home: Medicines   Take over-the-counter and prescription medicines only as told by your  health care provider.  Take your antibiotic as told by your health care provider. Do not stop taking the antibiotic even if you start to feel better.  Have family members who also have a sore throat or fever tested for strep throat. They may need antibiotics if they have the strep infection. Eating and drinking   Do not share food, drinking cups, or personal items that could cause the infection to spread to other people.  If swallowing is difficult, try eating soft foods until your sore throat feels better.  Drink enough fluid to keep your urine clear or pale yellow. General instructions   Gargle with a salt-water mixture 3-4 times per day or as needed. To make a salt-water mixture, completely dissolve -1 tsp of salt in 1 cup of warm water.  Make sure that all household members wash their hands well.  Get plenty of rest.  Stay home from school or work until you have been taking antibiotics for 24 hours.  Keep all follow-up visits as told by your health care provider. This is important. Contact a health care provider if:  The glands in your neck continue to get bigger.  You develop a rash, cough, or earache.  You cough up a thick liquid that is green, yellow-brown, or bloody.  You have pain or discomfort that does not get better with medicine.  Your problems seem to be getting worse rather than better.  You have a fever. Get help right away if:  You have new symptoms, such as vomiting, severe headache,  stiff or painful neck, chest pain, or shortness of breath.  You have severe throat pain, drooling, or changes in your voice.  You have swelling of the neck, or the skin on the neck becomes red and tender.  You have signs of dehydration, such as fatigue, dry mouth, and decreased urination.  You become increasingly sleepy, or you cannot wake up completely.  Your joints become red or painful. This information is not intended to replace advice given to you by your health  care provider. Make sure you discuss any questions you have with your health care provider. Document Released: 11/09/2000 Document Revised: 07/11/2016 Document Reviewed: 03/07/2015 Elsevier Interactive Patient Education  2017 [employer].

## 2017-02-08 ENCOUNTER — Ambulatory Visit (INDEPENDENT_AMBULATORY_CARE_PROVIDER_SITE_OTHER)
Admission: RE | Admit: 2017-02-08 | Discharge: 2017-02-08 | Disposition: A | Discharge status: Home / Self Care | Payer: Medicare HMO | Source: Ambulatory Visit | Attending: Family Medicine | Admitting: Family Medicine

## 2017-02-08 ENCOUNTER — Ambulatory Visit (INDEPENDENT_AMBULATORY_CARE_PROVIDER_SITE_OTHER): Payer: Medicare HMO | Admitting: Family Medicine

## 2017-02-08 ENCOUNTER — Encounter: Payer: Self-pay | Admitting: Family Medicine

## 2017-02-08 VITALS — BP 101/89 | HR 101 | Temp 98.2°F | Ht 65.5 in | Wt 146.0 lb

## 2017-02-08 DIAGNOSIS — J189 Pneumonia, unspecified organism: Secondary | ICD-10-CM

## 2017-02-08 DIAGNOSIS — E538 Deficiency of other specified B group vitamins: Secondary | ICD-10-CM | POA: Diagnosis not present

## 2017-02-08 DIAGNOSIS — R05 Cough: Secondary | ICD-10-CM | POA: Diagnosis not present

## 2017-02-08 DIAGNOSIS — C3491 Malignant neoplasm of unspecified part of right bronchus or lung: Secondary | ICD-10-CM | POA: Diagnosis not present

## 2017-02-08 MED ORDER — CEFTRIAXONE SODIUM 1 G IJ SOLR
1.0000 g | Freq: Once | INTRAMUSCULAR | Status: AC
  Administered 2017-02-08: 1 g via INTRAMUSCULAR

## 2017-02-08 MED ORDER — CYANOCOBALAMIN 1000 MCG/ML IJ SOLN
1000.0000 ug | Freq: Once | INTRAMUSCULAR | Status: AC
  Administered 2017-02-08: 1000 ug via INTRAMUSCULAR

## 2017-02-08 MED ORDER — LEVOFLOXACIN 500 MG PO TABS: 500.0000 mg | ORAL_TABLET | Freq: Every day | ORAL | 0 refills | Status: AC

## 2017-02-08 NOTE — Addendum Note (Signed)
Addended by: Aggie Hacker A on: 02/08/2017 01:54 PM   Modules accepted: Orders

## 2017-02-08 NOTE — Patient Instructions (Signed)
WE NOW OFFER   [facility] FAST TRACK!!!  SAME DAY Appointments for ACUTE CARE  Such as: Sprains, Injuries, cuts, abrasions, rashes, muscle pain, joint pain, back pain Colds, flu, sore throats, headache, allergies, cough, fever  Ear pain, sinus and eye infections Abdominal pain, nausea, vomiting, diarrhea, upset stomach Animal/insect bites  3 Easy Ways to Schedule: Walk-In Scheduling Call in scheduling Mychart Sign-up: [email or web address]         

## 2017-02-08 NOTE — Progress Notes (Signed)
Pre visit review using our clinic review tool, if applicable. No additional management support is needed unless otherwise documented below in the visit note. 

## 2017-02-08 NOTE — Progress Notes (Signed)
   Subjective:    Patient ID: [name], female    DOB: 1946/11/01, 71 y.o.   MRN: [id number]  HPI Here to follow up on chest congestion and cough. She was seen on 02-02-17 with a ST and a cough, and she was diagnosed with strep throat by a positive rapid strep test. She was started on Cefdinir, and the ST quickly resolved. However the cough has gotten worse and she now produces green sputum. No chest pain but she does get SOB at times. Her fever has been up to 101 degrees. Of note she had a CT angiogram of the chest on 12-24-16 to check for pulmonary emboli and none were found. They did find an 11 mm subcarinal node that was suspicious and chronic RML atelectasis. The radiologist recommended that this node be biopsied by bronchoscopy. She has been trying to contact Dr. Elmon Kirschner, her Oncologist at [facility], about this but she has not received a call back back.    Review of Systems  Constitutional: Positive for fever.  HENT: Negative.   Eyes: Negative.   Respiratory: Positive for cough, chest tightness, shortness of breath and wheezing.   Cardiovascular: Negative.        Objective:   Physical Exam  Constitutional: She appears well-developed and well-nourished.  HENT:  Right Ear: External ear normal.  Left Ear: External ear normal.  Nose: Nose normal.  Mouth/Throat: Oropharynx is clear and moist.  Eyes: Conjunctivae are normal.  Neck: No thyromegaly present.  Cardiovascular: Normal rate, regular rhythm, normal heart sounds and intact distal pulses.   Pulmonary/Chest: Effort normal. No respiratory distress.  She has scattered wheezes and rales at both posterior bases   Lymphadenopathy:    She has no cervical adenopathy.          Assessment & Plan:  This is a pneumonia. She is given a shot of Rocephin. We will stop the Cefdinir and start her on 10 days of Levaquin. She will get a CXR this afternoon. She can use her inhalers as needed. We will try to contact Dr. Dara Hoyer, her  Pulmonologist at [facility], to discuss setting up a bronchoscopy.  Alysia Penna, MD

## 2017-02-11 ENCOUNTER — Telehealth: Payer: Self-pay | Admitting: Family Medicine

## 2017-02-11 NOTE — Addendum Note (Signed)
Addended by: Alysia Penna A on: 02/11/2017 07:13 AM   Modules accepted: Orders

## 2017-02-11 NOTE — Telephone Encounter (Signed)
Pt would like you to call them concerning xray results from Friday.

## 2017-02-11 NOTE — Telephone Encounter (Signed)
I spoke with pt and went over results. 

## 2017-02-14 DIAGNOSIS — J9811 Atelectasis: Secondary | ICD-10-CM | POA: Diagnosis not present

## 2017-02-14 DIAGNOSIS — J8410 Pulmonary fibrosis, unspecified: Secondary | ICD-10-CM | POA: Diagnosis not present

## 2017-02-14 DIAGNOSIS — C3490 Malignant neoplasm of unspecified part of unspecified bronchus or lung: Secondary | ICD-10-CM | POA: Diagnosis not present

## 2017-03-06 DIAGNOSIS — J9819 Other pulmonary collapse: Secondary | ICD-10-CM | POA: Diagnosis not present

## 2017-03-06 DIAGNOSIS — J701 Chronic and other pulmonary manifestations due to radiation: Secondary | ICD-10-CM | POA: Diagnosis not present

## 2017-03-06 DIAGNOSIS — C3411 Malignant neoplasm of upper lobe, right bronchus or lung: Secondary | ICD-10-CM | POA: Diagnosis not present

## 2017-03-06 DIAGNOSIS — C3490 Malignant neoplasm of unspecified part of unspecified bronchus or lung: Secondary | ICD-10-CM | POA: Diagnosis not present

## 2017-03-08 ENCOUNTER — Ambulatory Visit (INDEPENDENT_AMBULATORY_CARE_PROVIDER_SITE_OTHER): Payer: Medicare HMO | Admitting: Family Medicine

## 2017-03-08 ENCOUNTER — Encounter: Payer: Self-pay | Admitting: Family Medicine

## 2017-03-08 VITALS — BP 115/88 | HR 80 | Temp 98.2°F | Ht 65.5 in | Wt 142.0 lb

## 2017-03-08 DIAGNOSIS — M353 Polymyalgia rheumatica: Secondary | ICD-10-CM

## 2017-03-08 DIAGNOSIS — E538 Deficiency of other specified B group vitamins: Secondary | ICD-10-CM

## 2017-03-08 LAB — VITAMIN B12: VITAMIN B 12: 585 pg/mL (ref 211–911)

## 2017-03-08 LAB — C-REACTIVE PROTEIN: CRP: 0.7 mg/dL (ref 0.5–20.0)

## 2017-03-08 LAB — SEDIMENTATION RATE: SED RATE: 5 mm/h (ref 0–30)

## 2017-03-08 MED ORDER — POTASSIUM CHLORIDE ER 10 MEQ PO TBCR: 10.0000 meq | EXTENDED_RELEASE_TABLET | Freq: Every day | ORAL | 3 refills | Status: DC

## 2017-03-08 MED ORDER — CYANOCOBALAMIN 1000 MCG/ML IJ SOLN
1000.0000 ug | Freq: Once | INTRAMUSCULAR | Status: AC
  Administered 2017-03-08: 1000 ug via INTRAMUSCULAR

## 2017-03-08 MED ORDER — METHYLPREDNISOLONE 4 MG PO TBPK: ORAL_TABLET | ORAL | 0 refills | Status: DC

## 2017-03-08 NOTE — Progress Notes (Signed)
Pre visit review using our clinic review tool, if applicable. No additional management support is needed unless otherwise documented below in the visit note. 

## 2017-03-08 NOTE — Progress Notes (Signed)
   Subjective:    Patient ID: [name], female    DOB: Apr 16, 1946, 71 y.o.   MRN: [id number]  HPI Here for a B12 shot but also to discuss a 2 week hx of sudden onset of weakness, stiffness, and pain in both shoulders. She has never felt this before. No hip pain. She recently saw her Oncologist and she suggested she may have PMR. She has recovered from the pneumonia we saw her for last month.    Review of Systems  Constitutional: Negative.   Respiratory: Negative.   Cardiovascular: Negative.   Musculoskeletal: Positive for arthralgias.  Neurological: Negative.        Objective:   Physical Exam  Constitutional: She is oriented to person, place, and time. She appears well-developed and well-nourished.  Cardiovascular: Normal rate, regular rhythm, normal heart sounds and intact distal pulses.   Pulmonary/Chest: Effort normal and breath sounds normal.  Musculoskeletal:  Both shoulders are very tender and ROM is quite limited in both due to pain  Neurological: She is alert and oriented to person, place, and time.          Assessment & Plan:  Probable PMR. Get labs today including CRP and ESR. Given a Medrol dose pack. Check a B12 level today and she is given a shot.  Alysia Penna, MD

## 2017-03-08 NOTE — Patient Instructions (Signed)
WE NOW OFFER   [facility] FAST TRACK!!!  SAME DAY Appointments for ACUTE CARE  Such as: Sprains, Injuries, cuts, abrasions, rashes, muscle pain, joint pain, back pain Colds, flu, sore throats, headache, allergies, cough, fever  Ear pain, sinus and eye infections Abdominal pain, nausea, vomiting, diarrhea, upset stomach Animal/insect bites  3 Easy Ways to Schedule: Walk-In Scheduling Call in scheduling Mychart Sign-up: [email or web address]         

## 2017-03-08 NOTE — Addendum Note (Signed)
Addended by: Aggie Hacker A on: 03/08/2017 11:46 AM   Modules accepted: Orders

## 2017-03-11 LAB — RHEUMATOID FACTOR: Rhuematoid fact SerPl-aCnc: <14 IU/mL (ref <14)

## 2017-03-18 ENCOUNTER — Ambulatory Visit (INDEPENDENT_AMBULATORY_CARE_PROVIDER_SITE_OTHER): Payer: Medicare HMO | Admitting: Family Medicine

## 2017-03-18 ENCOUNTER — Encounter: Payer: Self-pay | Admitting: Family Medicine

## 2017-03-18 VITALS — BP 122/82 | HR 86 | Temp 98.3°F | Wt 139.2 lb

## 2017-03-18 DIAGNOSIS — J029 Acute pharyngitis, unspecified: Secondary | ICD-10-CM

## 2017-03-18 DIAGNOSIS — J020 Streptococcal pharyngitis: Secondary | ICD-10-CM | POA: Diagnosis not present

## 2017-03-18 LAB — POCT RAPID STREP A (OFFICE): RAPID STREP A SCREEN: POSITIVE — AB

## 2017-03-18 MED ORDER — CEPHALEXIN 500 MG PO CAPS: 500.0000 mg | ORAL_CAPSULE | Freq: Three times a day (TID) | ORAL | 0 refills | Status: DC

## 2017-03-18 NOTE — Progress Notes (Signed)
   Subjective:    Patient ID: [name], female    DOB: 05-02-1946, 71 y.o.   MRN: [id number]  HPI Here for the onset this morning of a bad ST. No fever or cough. She was treated for a positive praid strep test on 02-10-17 with [facility] and she seemed to recover well. She notes that the 2 grandchildren she watches every day have been treated for recurring strep infections. Of note we saw her last week for presumed PMR and gave her a Medrol dose pack. She responded quickly and now has no shoulder pain at all.    Review of Systems  Constitutional: Negative.   HENT: Positive for sore throat. Negative for congestion, postnasal drip, sinus pain and sinus pressure.   Eyes: Negative.   Respiratory: Negative.        Objective:   Physical Exam  Constitutional: She appears well-developed and well-nourished.  HENT:  Right Ear: External ear normal.  Left Ear: External ear normal.  Nose: Nose normal.  Mouth/Throat: Oropharynx is clear and moist.  Eyes: Conjunctivae are normal.  Neck: No thyromegaly present.  Shotty tender bilateral AC nodes   Pulmonary/Chest: Effort normal and breath sounds normal.          Assessment & Plan:  Recurrent strep throat. Treat with 10 days of Keflex. The  Grandchildren are being treated as well. They seem have been reinfecting each other. We will have Yesena return in 10 days for a retest.  Alysia Penna, MD

## 2017-03-18 NOTE — Progress Notes (Signed)
Pre visit review using our clinic review tool, if applicable. No additional management support is needed unless otherwise documented below in the visit note. 

## 2017-03-18 NOTE — Patient Instructions (Signed)
WE NOW OFFER   [name] FAST TRACK!!!  SAME DAY Appointments for ACUTE CARE  Such as: Sprains, Injuries, cuts, abrasions, rashes, muscle pain, joint pain, back pain Colds, flu, sore throats, headache, allergies, cough, fever  Ear pain, sinus and eye infections Abdominal pain, nausea, vomiting, diarrhea, upset stomach Animal/insect bites  3 Easy Ways to Schedule: Walk-In Scheduling Call in scheduling Mychart Sign-up: [email or web address]         

## 2017-03-27 ENCOUNTER — Ambulatory Visit (INDEPENDENT_AMBULATORY_CARE_PROVIDER_SITE_OTHER): Payer: Medicare HMO | Admitting: Family Medicine

## 2017-03-27 ENCOUNTER — Encounter: Payer: Self-pay | Admitting: Family Medicine

## 2017-03-27 VITALS — BP 98/76 | HR 92 | Temp 98.2°F | Ht 65.5 in | Wt 136.0 lb

## 2017-03-27 DIAGNOSIS — E538 Deficiency of other specified B group vitamins: Secondary | ICD-10-CM | POA: Diagnosis not present

## 2017-03-27 DIAGNOSIS — J019 Acute sinusitis, unspecified: Secondary | ICD-10-CM | POA: Diagnosis not present

## 2017-03-27 MED ORDER — LEVOFLOXACIN 500 MG PO TABS: 500.0000 mg | ORAL_TABLET | Freq: Every day | ORAL | 0 refills | Status: AC

## 2017-03-27 MED ORDER — CYANOCOBALAMIN 1000 MCG/ML IJ SOLN
1000.0000 ug | Freq: Once | INTRAMUSCULAR | Status: AC
  Administered 2017-03-27: 1000 ug via INTRAMUSCULAR

## 2017-03-27 NOTE — Progress Notes (Signed)
Pre visit review using our clinic review tool, if applicable. No additional management support is needed unless otherwise documented below in the visit note. 

## 2017-03-27 NOTE — Progress Notes (Signed)
   Subjective:    Patient ID: [name], female    DOB: 19-Aug-1946, 71 y.o.   MRN: [id number]  HPI Here for continued URI symptoms. We saw her on 03-18-17 for a ST and we treated her for apparent strep throat, but this never helped. She still has the ST but now also has sinus congestion, PND, and a dry cough. No fever.    Review of Systems  Constitutional: Negative.   HENT: Positive for congestion, sinus pressure and sore throat. Negative for ear pain and sinus pain.   Eyes: Negative.   Respiratory: Positive for cough.        Objective:   Physical Exam  Constitutional: She appears well-developed and well-nourished.  HENT:  Right Ear: External ear normal.  Left Ear: External ear normal.  Nose: Nose normal.  Mouth/Throat: Oropharynx is clear and moist.  Eyes: Conjunctivae are normal.  Neck: No thyromegaly present.  Pulmonary/Chest: Effort normal and breath sounds normal. No respiratory distress. She has no wheezes. She has no rales.  Lymphadenopathy:    She has no cervical adenopathy.          Assessment & Plan:  Sinusitis, treat with Levaquin. Drink fluids.  Alysia Penna, MD

## 2017-03-27 NOTE — Patient Instructions (Signed)
WE NOW OFFER   [facility] FAST TRACK!!!  SAME DAY Appointments for ACUTE CARE  Such as: Sprains, Injuries, cuts, abrasions, rashes, muscle pain, joint pain, back pain Colds, flu, sore throats, headache, allergies, cough, fever  Ear pain, sinus and eye infections Abdominal pain, nausea, vomiting, diarrhea, upset stomach Animal/insect bites  3 Easy Ways to Schedule: Walk-In Scheduling Call in scheduling Mychart Sign-up: [email or web address]         

## 2017-04-08 ENCOUNTER — Telehealth: Payer: Self-pay | Admitting: Family Medicine

## 2017-04-08 MED ORDER — METHYLPREDNISOLONE 4 MG PO TBPK: ORAL_TABLET | ORAL | 0 refills | Status: DC

## 2017-04-08 NOTE — Telephone Encounter (Signed)
Call in a Medrol dose pack  

## 2017-04-08 NOTE — Telephone Encounter (Signed)
I sent script e-scribe to [facility] and spoke with pt

## 2017-04-08 NOTE — Telephone Encounter (Signed)
° °  Pt call to say Dr Sarajane Jews had given her something for the pay in her arm and it did help and now it is back again and is asking if he will call her in something   Pt would like a call back  [name]

## 2017-04-17 ENCOUNTER — Other Ambulatory Visit: Payer: Self-pay | Admitting: Family Medicine

## 2017-04-17 NOTE — Telephone Encounter (Signed)
Can we refill this? 

## 2017-05-08 ENCOUNTER — Ambulatory Visit (INDEPENDENT_AMBULATORY_CARE_PROVIDER_SITE_OTHER): Payer: Medicare HMO

## 2017-05-08 DIAGNOSIS — E538 Deficiency of other specified B group vitamins: Secondary | ICD-10-CM

## 2017-05-08 MED ORDER — CYANOCOBALAMIN 1000 MCG/ML IJ SOLN
1000.0000 ug | Freq: Once | INTRAMUSCULAR | Status: AC
  Administered 2017-05-08: 1000 ug via INTRAMUSCULAR

## 2017-05-22 ENCOUNTER — Ambulatory Visit (INDEPENDENT_AMBULATORY_CARE_PROVIDER_SITE_OTHER): Payer: Medicare HMO | Admitting: Adult Health

## 2017-05-22 ENCOUNTER — Ambulatory Visit (INDEPENDENT_AMBULATORY_CARE_PROVIDER_SITE_OTHER)
Admission: RE | Admit: 2017-05-22 | Discharge: 2017-05-22 | Disposition: A | Discharge status: Home / Self Care | Payer: Medicare HMO | Source: Ambulatory Visit | Attending: Adult Health | Admitting: Adult Health

## 2017-05-22 ENCOUNTER — Encounter: Payer: Self-pay | Admitting: Adult Health

## 2017-05-22 VITALS — BP 124/78 | HR 101 | Temp 98.7°F | Ht 65.5 in | Wt 137.0 lb

## 2017-05-22 DIAGNOSIS — J069 Acute upper respiratory infection, unspecified: Secondary | ICD-10-CM

## 2017-05-22 DIAGNOSIS — E538 Deficiency of other specified B group vitamins: Secondary | ICD-10-CM

## 2017-05-22 DIAGNOSIS — R05 Cough: Secondary | ICD-10-CM | POA: Diagnosis not present

## 2017-05-22 MED ORDER — CYANOCOBALAMIN 1000 MCG/ML IJ SOLN
1000.0000 ug | Freq: Once | INTRAMUSCULAR | Status: AC
  Administered 2017-05-22: 1000 ug via INTRAMUSCULAR

## 2017-05-22 MED ORDER — METHYLPREDNISOLONE 4 MG PO TBPK: ORAL_TABLET | ORAL | 0 refills | Status: DC

## 2017-05-22 NOTE — Progress Notes (Signed)
Subjective:    Patient ID: [name], female    DOB: 06-26-1946, 71 y.o.   MRN: [id number]  HPI  71 year old female who  has a past medical history of Anemia; Cancer ([place]); Cataract; Clotting disorder ([place]); Community acquired pneumonia (01/22/2013); DVT (deep venous thrombosis) ([place]); Fibromyalgia; Hyperlipidemia; Hypertension; Peripheral vascular disease ([place]); PONV (postoperative nausea and vomiting); Squamous cell lung cancer ([place]) (2013); and Tobacco abuse.  She is a patient of Dr. Sarajane Jews who I am seeing today for an acute issue semi productive cough x 3 weeks, chest tightness, shortness of breath and fatigue.   She denies any fevers.   She does have a history of recurrent bronchitis, pneumonia and lung cancer.   Review of Systems See HPI   Past Medical History:  Diagnosis Date  . Anemia   . Cancer ([place])   . Cataract   . Clotting disorder ([place])   . Community acquired pneumonia 01/22/2013  . DVT (deep venous thrombosis) ([facility])    had 2 in right calf & 1 in the left calf  . Fibromyalgia   . Hyperlipidemia   . Hypertension   . Peripheral vascular disease ([place])   . PONV (postoperative nausea and vomiting)   . Squamous cell lung cancer [facility]) 2013   sees Dr. Elmon Kirschner at [facility] and Dr. Jaye Beagle at [facility]   . Tobacco abuse     Social History   Social History  . Marital status: Married    Spouse name: Gwenlyn Perking  . Number of children: 3  . Years of education: N/A   Occupational History  . Retired Surveyor, minerals. Unemployed   Social History Main Topics  . Smoking status: Former Smoker    Quit date: 05/23/2012  . Smokeless tobacco: Never Used  . Alcohol use 0.0 oz/week     Comment: rare  . Drug use: No  . Sexual activity: No   Other Topics Concern  . Not on file   Social History Narrative   ** Merged History Encounter **       Married.  Lives with husband in [place].  Independent of ADLs.    Past Surgical History:  Procedure Laterality  Date  . APPENDECTOMY    . CHOLECYSTECTOMY    . COLONOSCOPY     11/28/09 repeat in 10 years  . ESOPHAGOGASTRODUODENOSCOPY  October 2013   severe esophagitis, done at [facility]   . KNEE ARTHROSCOPY     right  . LAMINECTOMY     lumbar  . PORTACATH PLACEMENT    . TONSILLECTOMY    . VIDEO BRONCHOSCOPY  05/30/2012   Procedure: VIDEO BRONCHOSCOPY WITH FLUORO;  Surgeon: Elsie Stain, MD;  Location: [place];  Service: Cardiopulmonary;  Laterality: N/A;    Family History  Problem Relation Age of Onset  . Cancer Father        NHL  . Hypertension Mother        family history  . Atrial fibrillation Mother   . Cancer Mother        Lung  . Anemia Unknown        FE deficiency   . Arthritis Unknown        family history  . Cancer Unknown        colon 1st degree relative <60  . Multiple sclerosis Sister     Allergies  Allergen Reactions  . Zofran [Ondansetron Hcl]     Patient states she should not take Zofran due  to her heart.  . Morphine And Related Other (See Comments)    Hard time waking up when taking morphine, codeine, and related medications    Current Outpatient Prescriptions on File Prior to Visit  Medication Sig Dispense Refill  . acetaminophen (TYLENOL) 500 MG tablet Take 1,000 mg by mouth every 6 (six) hours as needed for mild pain.    Marland Kitchen albuterol (PROVENTIL HFA;VENTOLIN HFA) 108 (90 Base) MCG/ACT inhaler Inhale 2 puffs into the lungs every 4 (four) hours as needed for wheezing or shortness of breath. 1 Inhaler 11  . buPROPion (WELLBUTRIN XL) 300 MG 24 hr tablet Take 1 tablet (300 mg total) by mouth daily. 30 tablet 11  . fluticasone (FLOVENT HFA) 110 MCG/ACT inhaler Inhale 2 puffs into the lungs 2 (two) times daily. 2 puffs 1 Inhaler 6  . omeprazole (PRILOSEC) 20 MG capsule Take 20 mg by mouth 2 (two) times daily as needed.    . potassium chloride (KLOR-CON 10) 10 MEQ tablet Take 1 tablet (10 mEq total) by mouth daily. 90 tablet 3  . XARELTO 20 MG TABS tablet TAKE ONE  TABLET BY MOUTH ONCE DAILY WITH  SUPPER 30 tablet 11   No current facility-administered medications on file prior to visit.     BP 124/78 (BP Location: Left Arm, Patient Position: Sitting, Cuff Size: Small)   Pulse (!) 101   Temp 98.7 F (37.1 C) (Oral)   Ht 5' 5.5" (1.664 m)   Wt 137 lb (62.1 kg)   SpO2 95%   BMI 22.45 kg/m       Objective:   Physical Exam  Constitutional: She is oriented to person, place, and time. She appears well-developed and well-nourished. No distress.  Cardiovascular: Normal rate, regular rhythm, normal heart sounds and intact distal pulses.  Exam reveals no gallop and no friction rub.   No murmur heard. Pulmonary/Chest: Effort normal and breath sounds normal. No respiratory distress. She has no wheezes. She has no rales. She exhibits no tenderness.  Slight wheezing with coughing. No rales or rhonchi noted.   Neurological: She is alert and oriented to person, place, and time.  Skin: Skin is warm and dry. No rash noted. She is not diaphoretic. No erythema. No pallor.  Psychiatric: She has a normal mood and affect. Her behavior is normal. Judgment and thought content normal.  Nursing note and vitals reviewed.     Assessment & Plan:  1. Upper respiratory tract infection, unspecified type - methylPREDNISolone (MEDROL DOSEPAK) 4 MG TBPK tablet; Take as directed  Dispense: 21 tablet; Refill: 0 - DG Chest 2 View; Future - Will treat with antibiotics if warrented after chest x ray   Dorothyann Peng, NP

## 2017-05-22 NOTE — Addendum Note (Signed)
Addended by: Sandria Bales B on: 05/22/2017 11:36 AM   Modules accepted: Orders

## 2017-07-12 ENCOUNTER — Ambulatory Visit (INDEPENDENT_AMBULATORY_CARE_PROVIDER_SITE_OTHER): Payer: Medicare HMO | Admitting: Family Medicine

## 2017-07-12 ENCOUNTER — Encounter: Payer: Self-pay | Admitting: Family Medicine

## 2017-07-12 VITALS — BP 118/94 | HR 85 | Ht 65.5 in | Wt 136.0 lb

## 2017-07-12 DIAGNOSIS — E538 Deficiency of other specified B group vitamins: Secondary | ICD-10-CM

## 2017-07-12 DIAGNOSIS — J181 Lobar pneumonia, unspecified organism: Secondary | ICD-10-CM | POA: Diagnosis not present

## 2017-07-12 DIAGNOSIS — J189 Pneumonia, unspecified organism: Secondary | ICD-10-CM

## 2017-07-12 MED ORDER — CEFTRIAXONE SODIUM 1 G IJ SOLR
1.0000 g | Freq: Once | INTRAMUSCULAR | Status: AC
  Administered 2017-07-12: 1 g via INTRAMUSCULAR

## 2017-07-12 MED ORDER — LEVOFLOXACIN 500 MG PO TABS: 500.0000 mg | ORAL_TABLET | Freq: Every day | ORAL | 0 refills | Status: DC

## 2017-07-12 MED ORDER — CYANOCOBALAMIN 1000 MCG/ML IJ SOLN
1000.0000 ug | Freq: Once | INTRAMUSCULAR | Status: AC
  Administered 2017-07-12: 1000 ug via INTRAMUSCULAR

## 2017-07-12 NOTE — Progress Notes (Signed)
   Subjective:    Patient ID: [name], female    DOB: 1946-03-23, 71 y.o.   MRN: [id number]  HPI Here for chest congestion and a dry cough that started about 6 weeks ago. She has not had a fever, but she is very fatigued and the cough is getting worse. She was seen here on 05-22-17 and had a benign exam. Her CXR that day showed some chronic postradiation changes but no pneumonia. She was given a prednisone taper and she felt a little better for a week, then she started to feel worse again.    Review of Systems  Constitutional: Negative.   HENT: Negative.   Eyes: Positive for discharge.  Respiratory: Positive for cough, chest tightness, shortness of breath and wheezing. Negative for choking.   Cardiovascular: Positive for chest pain. Negative for palpitations and leg swelling.       Objective:   Physical Exam  Constitutional: She appears well-developed and well-nourished. No distress.  Neck: Neck supple. No thyromegaly present.  Cardiovascular: Normal rate, regular rhythm, normal heart sounds and intact distal pulses.   Pulmonary/Chest: Effort normal. No respiratory distress. She has no wheezes.  She has decreased sounds and consolidation in the right posterior base   Lymphadenopathy:    She has no cervical adenopathy.  Psychiatric: She has a normal mood and affect. Her behavior is normal. Thought content normal.          Assessment & Plan:  RLL pneumonia. Treat with a shot of Rocephin and a 10 day course of Levaquin. Recheck in one week.  Alysia Penna, MD

## 2017-07-12 NOTE — Addendum Note (Signed)
Addended by: Aggie Hacker A on: 07/12/2017 03:57 PM   Modules accepted: Orders

## 2017-07-12 NOTE — Patient Instructions (Signed)
WE NOW OFFER   [facility] FAST TRACK!!!  SAME DAY Appointments for ACUTE CARE  Such as: Sprains, Injuries, cuts, abrasions, rashes, muscle pain, joint pain, back pain Colds, flu, sore throats, headache, allergies, cough, fever  Ear pain, sinus and eye infections Abdominal pain, nausea, vomiting, diarrhea, upset stomach Animal/insect bites  3 Easy Ways to Schedule: Walk-In Scheduling Call in scheduling Mychart Sign-up: [email or web address]         

## 2017-07-16 ENCOUNTER — Encounter: Payer: Self-pay | Admitting: Family Medicine

## 2017-07-16 ENCOUNTER — Ambulatory Visit (INDEPENDENT_AMBULATORY_CARE_PROVIDER_SITE_OTHER): Payer: Medicare HMO | Admitting: Family Medicine

## 2017-07-16 ENCOUNTER — Ambulatory Visit (INDEPENDENT_AMBULATORY_CARE_PROVIDER_SITE_OTHER)
Admission: RE | Admit: 2017-07-16 | Discharge: 2017-07-16 | Disposition: A | Discharge status: Home / Self Care | Payer: Medicare HMO | Source: Ambulatory Visit | Attending: Family Medicine | Admitting: Family Medicine

## 2017-07-16 VITALS — BP 120/88 | HR 97 | Temp 98.2°F | Ht 65.5 in | Wt 136.0 lb

## 2017-07-16 DIAGNOSIS — J189 Pneumonia, unspecified organism: Secondary | ICD-10-CM

## 2017-07-16 DIAGNOSIS — J181 Lobar pneumonia, unspecified organism: Secondary | ICD-10-CM

## 2017-07-16 DIAGNOSIS — R05 Cough: Secondary | ICD-10-CM | POA: Diagnosis not present

## 2017-07-16 MED ORDER — DOXYCYCLINE HYCLATE 100 MG PO CAPS: 100.0000 mg | ORAL_CAPSULE | Freq: Two times a day (BID) | ORAL | 0 refills | Status: AC

## 2017-07-16 NOTE — Progress Notes (Signed)
   Subjective:    Patient ID: [name], female    DOB: Mar 09, 1946, 71 y.o.   MRN: [id number]  HPI Here to follow up a right sided  pneumonia diagnosed on exam last week. She was given a shot of Rocephin that day and started on Levaquin. Today she feels no better and no worse than before. Still no fever. But she has a deep nonproductive cough and she has a mild pain in the right middle back when she coughs. No SOB. Of note during her bronchoscopy with Dr. Odetta Pink at [facility] on 03-20-16 a web of tissue was seen to be obstructing the right middle bronchus.    Review of Systems  Constitutional: Negative.   Respiratory: Positive for cough. Negative for shortness of breath and wheezing.   Cardiovascular: Negative.   Neurological: Negative.        Objective:   Physical Exam  Constitutional: She appears well-developed and well-nourished. No distress.  Neck: No thyromegaly present.  Cardiovascular: Normal rate, regular rhythm, normal heart sounds and intact distal pulses.   Pulmonary/Chest: Effort normal. No respiratory distress. She has no wheezes. She has no rales.  The right middle and lower lung areas are quiet and show some consolidation  Lymphadenopathy:    She has no cervical adenopathy.          Assessment & Plan:  Probable post-obstructive pneumonia in the RML. Stay on Levaquin and add Doxycycline to it. Get a CXR today. We will see her back on Friday. If she is not improving at that point we will have her see Pulmonary in [facility] again.  Alysia Penna, MD

## 2017-07-16 NOTE — Patient Instructions (Signed)
WE NOW OFFER   [facility] FAST TRACK!!!  SAME DAY Appointments for ACUTE CARE  Such as: Sprains, Injuries, cuts, abrasions, rashes, muscle pain, joint pain, back pain Colds, flu, sore throats, headache, allergies, cough, fever  Ear pain, sinus and eye infections Abdominal pain, nausea, vomiting, diarrhea, upset stomach Animal/insect bites  3 Easy Ways to Schedule: Walk-In Scheduling Call in scheduling Mychart Sign-up: [email or web address]         

## 2017-07-17 ENCOUNTER — Telehealth: Payer: Self-pay | Admitting: Family Medicine

## 2017-07-17 NOTE — Telephone Encounter (Signed)
Pt calling for xray results °

## 2017-07-18 NOTE — Telephone Encounter (Signed)
appt has been cancelled.

## 2017-07-18 NOTE — Telephone Encounter (Signed)
I spoke with pt and sent over xray results.

## 2017-07-18 NOTE — Telephone Encounter (Signed)
Can you please cancel appointment for tomorrow Friday 07/19/2017?

## 2017-07-19 ENCOUNTER — Ambulatory Visit: Payer: Medicare HMO | Admitting: Family Medicine

## 2017-07-23 ENCOUNTER — Ambulatory Visit (INDEPENDENT_AMBULATORY_CARE_PROVIDER_SITE_OTHER): Payer: Medicare HMO | Admitting: Family Medicine

## 2017-07-23 ENCOUNTER — Encounter: Payer: Self-pay | Admitting: Family Medicine

## 2017-07-23 VITALS — BP 98/72 | Temp 98.4°F

## 2017-07-23 DIAGNOSIS — R05 Cough: Secondary | ICD-10-CM | POA: Diagnosis not present

## 2017-07-23 DIAGNOSIS — R059 Cough, unspecified: Secondary | ICD-10-CM

## 2017-07-23 DIAGNOSIS — R053 Chronic cough: Secondary | ICD-10-CM | POA: Insufficient documentation

## 2017-07-23 NOTE — Progress Notes (Signed)
   Subjective:    Patient ID: [name], female    DOB: 07-09-1946, 71 y.o.   MRN: [id number]  HPI Here to follow up a presumed post-obstructive pneumonia in the RML. She has been taking Levaquin and Doxycycline. She really does not feel any better now. She still has a persistent nonproductive cough and a sharp right sided chest and middle back pain under the scapula. No SOB or fever.    Review of Systems  Constitutional: Negative.   Eyes: Negative.   Respiratory: Positive for cough. Negative for shortness of breath and wheezing.   Cardiovascular: Positive for chest pain.       Objective:   Physical Exam  Constitutional: She appears well-developed and well-nourished. No distress.  Neck: No thyromegaly present.  Cardiovascular: Normal rate, regular rhythm, normal heart sounds and intact distal pulses.   Pulmonary/Chest: Effort normal. No respiratory distress. She has no wheezes. She has no rales.  Breath sounds are consolidated in the right middle lobe   Lymphadenopathy:    She has no cervical adenopathy.          Assessment & Plan:  With her hx of lung cancer we are concerned about a blocked bronchus. She will take the last few days of the antibiotics. Set up a contrasted chest CT soon.  Alysia Penna, MD

## 2017-07-23 NOTE — Patient Instructions (Signed)
WE NOW OFFER   [facility] FAST TRACK!!!  SAME DAY Appointments for ACUTE CARE  Such as: Sprains, Injuries, cuts, abrasions, rashes, muscle pain, joint pain, back pain Colds, flu, sore throats, headache, allergies, cough, fever  Ear pain, sinus and eye infections Abdominal pain, nausea, vomiting, diarrhea, upset stomach Animal/insect bites  3 Easy Ways to Schedule: Walk-In Scheduling Call in scheduling Mychart Sign-up: [email or web address]         

## 2017-07-24 LAB — BASIC METABOLIC PANEL
BUN: 14 mg/dL (ref 6–23)
CHLORIDE: 104 meq/L (ref 96–112)
CO2: 30 mEq/L (ref 19–32)
CREATININE: 0.96 mg/dL (ref 0.40–1.20)
Calcium: 9.9 mg/dL (ref 8.4–10.5)
GFR: 60.65 mL/min (ref >60.00)
Glucose, Bld: 80 mg/dL (ref 70–99)
Potassium: 4.2 mEq/L (ref 3.5–5.1)
Sodium: 141 mEq/L (ref 135–145)

## 2017-08-01 ENCOUNTER — Ambulatory Visit (INDEPENDENT_AMBULATORY_CARE_PROVIDER_SITE_OTHER)
Admission: RE | Admit: 2017-08-01 | Discharge: 2017-08-01 | Disposition: A | Discharge status: Home / Self Care | Payer: Medicare HMO | Source: Ambulatory Visit | Attending: Family Medicine | Admitting: Family Medicine

## 2017-08-01 DIAGNOSIS — R05 Cough: Secondary | ICD-10-CM

## 2017-08-01 DIAGNOSIS — R918 Other nonspecific abnormal finding of lung field: Secondary | ICD-10-CM | POA: Diagnosis not present

## 2017-08-01 DIAGNOSIS — R053 Chronic cough: Secondary | ICD-10-CM

## 2017-08-01 MED ORDER — IOPAMIDOL (ISOVUE-300) INJECTION 61%
80.0000 mL | Freq: Once | INTRAVENOUS | Status: AC | PRN
  Administered 2017-08-01: 80 mL via INTRAVENOUS

## 2017-08-02 ENCOUNTER — Telehealth: Payer: Self-pay | Admitting: Family Medicine

## 2017-08-02 NOTE — Telephone Encounter (Signed)
Patient would like a call back with the results of her CT Scan.

## 2017-08-05 ENCOUNTER — Telehealth: Payer: Self-pay | Admitting: Family Medicine

## 2017-08-05 NOTE — Telephone Encounter (Signed)
Pt would really like to know about CT chest today if possible please.

## 2017-08-05 NOTE — Telephone Encounter (Signed)
See my Result Note  

## 2017-08-05 NOTE — Telephone Encounter (Signed)
I spoke with pt and went over results. 

## 2017-08-05 NOTE — Telephone Encounter (Signed)
° ° ° °  Pt following up on CT scan

## 2017-08-12 DIAGNOSIS — C3490 Malignant neoplasm of unspecified part of unspecified bronchus or lung: Secondary | ICD-10-CM | POA: Diagnosis not present

## 2017-08-22 DIAGNOSIS — R911 Solitary pulmonary nodule: Secondary | ICD-10-CM | POA: Diagnosis not present

## 2017-08-28 DIAGNOSIS — Z923 Personal history of irradiation: Secondary | ICD-10-CM | POA: Diagnosis not present

## 2017-08-28 DIAGNOSIS — Z87891 Personal history of nicotine dependence: Secondary | ICD-10-CM | POA: Diagnosis not present

## 2017-08-28 DIAGNOSIS — J9811 Atelectasis: Secondary | ICD-10-CM | POA: Diagnosis not present

## 2017-08-28 DIAGNOSIS — C3490 Malignant neoplasm of unspecified part of unspecified bronchus or lung: Secondary | ICD-10-CM | POA: Diagnosis not present

## 2017-08-28 DIAGNOSIS — J9809 Other diseases of bronchus, not elsewhere classified: Secondary | ICD-10-CM | POA: Diagnosis not present

## 2017-08-28 DIAGNOSIS — K449 Diaphragmatic hernia without obstruction or gangrene: Secondary | ICD-10-CM | POA: Diagnosis not present

## 2017-08-28 DIAGNOSIS — R918 Other nonspecific abnormal finding of lung field: Secondary | ICD-10-CM | POA: Diagnosis not present

## 2017-08-28 DIAGNOSIS — K219 Gastro-esophageal reflux disease without esophagitis: Secondary | ICD-10-CM | POA: Diagnosis not present

## 2017-08-28 DIAGNOSIS — Z9221 Personal history of antineoplastic chemotherapy: Secondary | ICD-10-CM | POA: Diagnosis not present

## 2017-08-28 DIAGNOSIS — Z86718 Personal history of other venous thrombosis and embolism: Secondary | ICD-10-CM | POA: Diagnosis not present

## 2017-08-28 DIAGNOSIS — Z9049 Acquired absence of other specified parts of digestive tract: Secondary | ICD-10-CM | POA: Diagnosis not present

## 2017-09-02 ENCOUNTER — Ambulatory Visit: Payer: Medicare HMO | Admitting: Family Medicine

## 2017-09-02 ENCOUNTER — Ambulatory Visit (INDEPENDENT_AMBULATORY_CARE_PROVIDER_SITE_OTHER): Payer: Medicare HMO | Admitting: Family Medicine

## 2017-09-02 ENCOUNTER — Encounter: Payer: Self-pay | Admitting: Family Medicine

## 2017-09-02 VITALS — BP 102/60 | Ht 65.5 in | Wt 137.0 lb

## 2017-09-02 DIAGNOSIS — N3000 Acute cystitis without hematuria: Secondary | ICD-10-CM

## 2017-09-02 DIAGNOSIS — E538 Deficiency of other specified B group vitamins: Secondary | ICD-10-CM | POA: Diagnosis not present

## 2017-09-02 DIAGNOSIS — R350 Frequency of micturition: Secondary | ICD-10-CM | POA: Diagnosis not present

## 2017-09-02 LAB — POC URINALSYSI DIPSTICK (AUTOMATED)
Glucose, UA: NEGATIVE
NITRITE UA: NEGATIVE
PH UA: 5.5 (ref 5.0–8.0)
Spec Grav, UA: >=1.03 — AB (ref 1.010–1.025)
Urobilinogen, UA: 0.2 E.U./dL

## 2017-09-02 MED ORDER — NITROFURANTOIN MONOHYD MACRO 100 MG PO CAPS: 100.0000 mg | ORAL_CAPSULE | Freq: Two times a day (BID) | ORAL | 0 refills | Status: DC

## 2017-09-02 MED ORDER — CYANOCOBALAMIN 1000 MCG/ML IJ SOLN
1000.0000 ug | Freq: Once | INTRAMUSCULAR | Status: AC
  Administered 2017-09-02: 1000 ug via INTRAMUSCULAR

## 2017-09-02 NOTE — Patient Instructions (Signed)
WE NOW OFFER   [name] FAST TRACK!!!  SAME DAY Appointments for ACUTE CARE  Such as: Sprains, Injuries, cuts, abrasions, rashes, muscle pain, joint pain, back pain Colds, flu, sore throats, headache, allergies, cough, fever  Ear pain, sinus and eye infections Abdominal pain, nausea, vomiting, diarrhea, upset stomach Animal/insect bites  3 Easy Ways to Schedule: Walk-In Scheduling Call in scheduling Mychart Sign-up: [email or web address]         

## 2017-09-02 NOTE — Progress Notes (Signed)
   Subjective:    Patient ID: [name], female    DOB: 1946/08/29, 71 y.o.   MRN: [id number]  HPI Here for 2 days of urgency to urinate wit some burning. No fever. She drinks plenty of water.    Review of Systems  Constitutional: Negative.   Respiratory: Negative.   Cardiovascular: Negative.   Gastrointestinal: Negative.   Genitourinary: Positive for dysuria, frequency and urgency. Negative for flank pain.       Objective:   Physical Exam  Constitutional: She appears well-developed and well-nourished.  Cardiovascular: Normal rate, regular rhythm, normal heart sounds and intact distal pulses.   Pulmonary/Chest: Effort normal and breath sounds normal.  Abdominal: Soft. Bowel sounds are normal. She exhibits no distension and no mass. There is no tenderness. There is no rebound and no guarding.          Assessment & Plan:  UTI, treat with Macrobid. Culture the sample.  Alysia Penna, MD

## 2017-09-04 DIAGNOSIS — C3411 Malignant neoplasm of upper lobe, right bronchus or lung: Secondary | ICD-10-CM | POA: Diagnosis not present

## 2017-09-04 DIAGNOSIS — Z08 Encounter for follow-up examination after completed treatment for malignant neoplasm: Secondary | ICD-10-CM | POA: Diagnosis not present

## 2017-09-04 DIAGNOSIS — J701 Chronic and other pulmonary manifestations due to radiation: Secondary | ICD-10-CM | POA: Diagnosis not present

## 2017-09-04 DIAGNOSIS — J9859 Other diseases of mediastinum, not elsewhere classified: Secondary | ICD-10-CM | POA: Diagnosis not present

## 2017-09-04 DIAGNOSIS — R1310 Dysphagia, unspecified: Secondary | ICD-10-CM | POA: Diagnosis not present

## 2017-09-04 DIAGNOSIS — R911 Solitary pulmonary nodule: Secondary | ICD-10-CM | POA: Diagnosis not present

## 2017-09-04 DIAGNOSIS — Z86718 Personal history of other venous thrombosis and embolism: Secondary | ICD-10-CM | POA: Diagnosis not present

## 2017-09-04 DIAGNOSIS — Z79899 Other long term (current) drug therapy: Secondary | ICD-10-CM | POA: Diagnosis not present

## 2017-09-04 DIAGNOSIS — Z7901 Long term (current) use of anticoagulants: Secondary | ICD-10-CM | POA: Diagnosis not present

## 2017-09-04 DIAGNOSIS — Z853 Personal history of malignant neoplasm of breast: Secondary | ICD-10-CM | POA: Diagnosis not present

## 2017-09-04 DIAGNOSIS — C3490 Malignant neoplasm of unspecified part of unspecified bronchus or lung: Secondary | ICD-10-CM | POA: Diagnosis not present

## 2017-09-04 DIAGNOSIS — R05 Cough: Secondary | ICD-10-CM | POA: Diagnosis not present

## 2017-09-04 LAB — URINE CULTURE
MICRO NUMBER: 81117326
SPECIMEN QUALITY: ADEQUATE

## 2017-09-19 DIAGNOSIS — J479 Bronchiectasis, uncomplicated: Secondary | ICD-10-CM | POA: Diagnosis not present

## 2017-09-19 DIAGNOSIS — R05 Cough: Secondary | ICD-10-CM | POA: Diagnosis not present

## 2017-10-10 ENCOUNTER — Ambulatory Visit (INDEPENDENT_AMBULATORY_CARE_PROVIDER_SITE_OTHER): Payer: Medicare HMO

## 2017-10-10 DIAGNOSIS — Z23 Encounter for immunization: Secondary | ICD-10-CM | POA: Diagnosis not present

## 2017-10-10 DIAGNOSIS — E538 Deficiency of other specified B group vitamins: Secondary | ICD-10-CM | POA: Diagnosis not present

## 2017-10-10 MED ORDER — CYANOCOBALAMIN 1000 MCG/ML IJ SOLN
1000.0000 ug | Freq: Once | INTRAMUSCULAR | Status: AC
  Administered 2017-10-10: 1000 ug via INTRAMUSCULAR

## 2017-10-12 DIAGNOSIS — T50B95A Adverse effect of other viral vaccines, initial encounter: Secondary | ICD-10-CM | POA: Diagnosis not present

## 2017-10-12 DIAGNOSIS — R21 Rash and other nonspecific skin eruption: Secondary | ICD-10-CM | POA: Diagnosis not present

## 2017-10-15 DIAGNOSIS — Z885 Allergy status to narcotic agent status: Secondary | ICD-10-CM | POA: Diagnosis not present

## 2017-10-15 DIAGNOSIS — C3490 Malignant neoplasm of unspecified part of unspecified bronchus or lung: Secondary | ICD-10-CM | POA: Diagnosis not present

## 2017-10-15 DIAGNOSIS — I5189 Other ill-defined heart diseases: Secondary | ICD-10-CM | POA: Diagnosis not present

## 2017-10-15 DIAGNOSIS — Z923 Personal history of irradiation: Secondary | ICD-10-CM | POA: Diagnosis not present

## 2017-10-15 DIAGNOSIS — C3481 Malignant neoplasm of overlapping sites of right bronchus and lung: Secondary | ICD-10-CM | POA: Diagnosis not present

## 2017-10-15 DIAGNOSIS — J479 Bronchiectasis, uncomplicated: Secondary | ICD-10-CM | POA: Diagnosis not present

## 2017-10-15 DIAGNOSIS — Z87891 Personal history of nicotine dependence: Secondary | ICD-10-CM | POA: Diagnosis not present

## 2017-10-16 ENCOUNTER — Telehealth: Payer: Self-pay | Admitting: Family Medicine

## 2017-10-16 NOTE — Telephone Encounter (Signed)
Noted  

## 2017-10-16 NOTE — Telephone Encounter (Signed)
Sent to PCP as FYI.

## 2017-10-16 NOTE — Telephone Encounter (Signed)
Copied from [facility]. Topic: Quick Communication - See Telephone Encounter >> Oct 16, 2017 10:06 AM Scherrie Gerlach wrote: CRM for notification. See Telephone encounter for: pt reports she had a flu shot last Thursday. By Sat her arm was swollen, hot red and spreading around the whole arm. Pt went to UC and they gave her a steroid injection, and cephalexin 500 mg. They also advised pt to inform Dr Sarajane Jews.  10/16/17.

## 2017-10-31 ENCOUNTER — Ambulatory Visit: Payer: Medicare HMO | Admitting: Family Medicine

## 2017-10-31 ENCOUNTER — Encounter: Payer: Self-pay | Admitting: Family Medicine

## 2017-10-31 VITALS — BP 138/80 | HR 95 | Temp 98.0°F | Wt 142.4 lb

## 2017-10-31 DIAGNOSIS — I10 Essential (primary) hypertension: Secondary | ICD-10-CM

## 2017-10-31 DIAGNOSIS — E538 Deficiency of other specified B group vitamins: Secondary | ICD-10-CM

## 2017-10-31 DIAGNOSIS — R053 Chronic cough: Secondary | ICD-10-CM

## 2017-10-31 DIAGNOSIS — R05 Cough: Secondary | ICD-10-CM | POA: Diagnosis not present

## 2017-10-31 DIAGNOSIS — L03119 Cellulitis of unspecified part of limb: Secondary | ICD-10-CM

## 2017-10-31 DIAGNOSIS — M353 Polymyalgia rheumatica: Secondary | ICD-10-CM | POA: Diagnosis not present

## 2017-10-31 HISTORY — DX: Polymyalgia rheumatica: M35.3

## 2017-10-31 LAB — CBC WITH DIFFERENTIAL/PLATELET
BASOS ABS: 0 10*3/uL (ref 0.0–0.1)
BASOS PCT: 1.1 % (ref 0.0–3.0)
EOS ABS: 0.1 10*3/uL (ref 0.0–0.7)
Eosinophils Relative: 2.3 % (ref 0.0–5.0)
HEMATOCRIT: 42.7 % (ref 36.0–46.0)
HEMOGLOBIN: 14.2 g/dL (ref 12.0–15.0)
LYMPHS PCT: 27.8 % (ref 12.0–46.0)
Lymphs Abs: 0.9 10*3/uL (ref 0.7–4.0)
MCHC: 33.2 g/dL (ref 30.0–36.0)
MCV: 89.8 fl (ref 78.0–100.0)
MONO ABS: 0.3 10*3/uL (ref 0.1–1.0)
Monocytes Relative: 9.4 % (ref 3.0–12.0)
Neutro Abs: 2 10*3/uL (ref 1.4–7.7)
Neutrophils Relative %: 59.4 % (ref 43.0–77.0)
Platelets: 217 10*3/uL (ref 150.0–400.0)
RBC: 4.75 Mil/uL (ref 3.87–5.11)
RDW: 13.8 % (ref 11.5–15.5)
WBC: 3.3 10*3/uL — AB (ref 4.0–10.5)

## 2017-10-31 LAB — TSH: TSH: 0.77 u[IU]/mL (ref 0.35–4.50)

## 2017-10-31 LAB — HEPATIC FUNCTION PANEL
ALBUMIN: 4.4 g/dL (ref 3.5–5.2)
ALK PHOS: 47 U/L (ref 39–117)
ALT: 10 U/L (ref 0–35)
AST: 14 U/L (ref 0–37)
Bilirubin, Direct: 0.1 mg/dL (ref 0.0–0.3)
TOTAL PROTEIN: 6.3 g/dL (ref 6.0–8.3)
Total Bilirubin: 0.5 mg/dL (ref 0.2–1.2)

## 2017-10-31 LAB — BASIC METABOLIC PANEL
BUN: 18 mg/dL (ref 6–23)
CALCIUM: 9.6 mg/dL (ref 8.4–10.5)
CHLORIDE: 107 meq/L (ref 96–112)
CO2: 30 mEq/L (ref 19–32)
CREATININE: 0.87 mg/dL (ref 0.40–1.20)
GFR: 67.89 mL/min (ref >60.00)
Glucose, Bld: 68 mg/dL — ABNORMAL LOW (ref 70–99)
Potassium: 4.3 mEq/L (ref 3.5–5.1)
Sodium: 142 mEq/L (ref 135–145)

## 2017-10-31 MED ORDER — PREDNISONE 10 MG PO TABS: 10.0000 mg | ORAL_TABLET | Freq: Two times a day (BID) | ORAL | 2 refills | Status: DC

## 2017-10-31 MED ORDER — CYANOCOBALAMIN 1000 MCG/ML IJ SOLN
1000.0000 ug | Freq: Once | INTRAMUSCULAR | Status: AC
  Administered 2017-10-31: 1000 ug via INTRAMUSCULAR

## 2017-10-31 MED ORDER — SODIUM CHLORIDE 3 % IN NEBU: INHALATION_SOLUTION | RESPIRATORY_TRACT | 12 refills | Status: DC | PRN

## 2017-10-31 MED ORDER — SCOPOLAMINE 1 MG/3DAYS TD PT72: 1.0000 | MEDICATED_PATCH | TRANSDERMAL | 12 refills | Status: DC

## 2017-10-31 MED ORDER — ALBUTEROL SULFATE (2.5 MG/3ML) 0.083% IN NEBU: 2.5000 mg | INHALATION_SOLUTION | RESPIRATORY_TRACT | 12 refills | Status: DC | PRN

## 2017-10-31 NOTE — Progress Notes (Signed)
   Subjective:    Patient ID: [name], female    DOB: 01-Aug-1946, 71 y.o.   MRN: [id number]  HPI Here for an urgent care follow up from 10-26-17. She received a high dose flu shot here last week and a few days later she developed swelling, redness, warmth, and pain in the right upper arm around the injection site. No fever. She was given steroids and an antibiotic. Her arm symptoms resolved quickly. She recently saw Dr. Posey Pronto at [facility] for a pulmonary visit. He started her on nebulization treatments with hypertonic saline and albuterol, which have been helpful for her. She has suddenly started losing a lot of hair in the past month. No other medication changes. She does admit to a lot of stress in her life now, with her numerous medical issues. Lastly her PMR symptoms have returned causing stiffness and pain in both shoulders. The coming of colder weather each winter seems to trigger this.    Review of Systems  Constitutional: Negative.   Respiratory: Positive for cough and shortness of breath.   Cardiovascular: Negative.   Musculoskeletal: Positive for arthralgias.  Neurological: Negative.        Objective:   Physical Exam  Constitutional: She is oriented to person, place, and time. She appears well-developed and well-nourished.  Cardiovascular: Normal rate, regular rhythm, normal heart sounds and intact distal pulses.  Pulmonary/Chest: Effort normal and breath sounds normal. No respiratory distress. She has no wheezes. She has no rales.  Neurological: She is alert and oriented to person, place, and time.  Skin:  She has generalized thinning of the hair over the entire scalp          Assessment & Plan:  For the bronchiectasis and chronic cough, she will try the nebulization treatments per Dr. Posey Pronto. Her recent cellulitis from the flu shot has resolved. For the hair loss we will get labs today, including a TSH. For the PMR, she will start on Prednisone 10 mg bid for a  planned 4 weeks. Then we may be able to titrate down.  Alysia Penna, MD

## 2017-11-11 ENCOUNTER — Encounter: Payer: Self-pay | Admitting: Family Medicine

## 2017-11-11 ENCOUNTER — Ambulatory Visit (INDEPENDENT_AMBULATORY_CARE_PROVIDER_SITE_OTHER)
Admission: RE | Admit: 2017-11-11 | Discharge: 2017-11-11 | Disposition: A | Discharge status: Home / Self Care | Payer: Medicare HMO | Source: Ambulatory Visit | Attending: Family Medicine | Admitting: Family Medicine

## 2017-11-11 ENCOUNTER — Ambulatory Visit: Payer: Medicare HMO | Admitting: Family Medicine

## 2017-11-11 VITALS — BP 96/60 | HR 96 | Temp 98.1°F | Wt 136.2 lb

## 2017-11-11 DIAGNOSIS — C3490 Malignant neoplasm of unspecified part of unspecified bronchus or lung: Secondary | ICD-10-CM

## 2017-11-11 DIAGNOSIS — R05 Cough: Secondary | ICD-10-CM | POA: Diagnosis not present

## 2017-11-11 DIAGNOSIS — J209 Acute bronchitis, unspecified: Secondary | ICD-10-CM | POA: Diagnosis not present

## 2017-11-11 DIAGNOSIS — J471 Bronchiectasis with (acute) exacerbation: Secondary | ICD-10-CM

## 2017-11-11 DIAGNOSIS — J479 Bronchiectasis, uncomplicated: Secondary | ICD-10-CM | POA: Insufficient documentation

## 2017-11-11 MED ORDER — AMOXICILLIN-POT CLAVULANATE 875-125 MG PO TABS: 1.0000 | ORAL_TABLET | Freq: Two times a day (BID) | ORAL | 0 refills | Status: DC

## 2017-11-11 MED ORDER — BENZONATATE 200 MG PO CAPS: 200.0000 mg | ORAL_CAPSULE | Freq: Two times a day (BID) | ORAL | 2 refills | Status: DC | PRN

## 2017-11-11 NOTE — Progress Notes (Signed)
   Subjective:    Patient ID: [name], female    DOB: 20-Aug-1946, 71 y.o.   MRN: [id number]  HPI Here for 6 days of sweats, fever, coughing up green sputum, and anorexia. No chest pain. Drinking fluids and taking Mucinex. Using her nebulizers regularly.    Review of Systems  Constitutional: Positive for chills, diaphoresis, fatigue, fever and unexpected weight change.  HENT: Negative.   Eyes: Negative.   Respiratory: Positive for cough, chest tightness and shortness of breath. Negative for wheezing.   Cardiovascular: Negative.        Objective:   Physical Exam  Constitutional:  Appears mildly ill   HENT:  Right Ear: External ear normal.  Left Ear: External ear normal.  Nose: Nose normal.  Mouth/Throat: Oropharynx is clear and moist.  Eyes: Conjunctivae are normal.  Neck: Normal range of motion. Neck supple.  Cardiovascular: Normal rate, regular rhythm, normal heart sounds and intact distal pulses.  Pulmonary/Chest: Effort normal. She has no wheezes. She has no rales.  Scattered rhonchi   Lymphadenopathy:    She has no cervical adenopathy.          Assessment & Plan:  Bronchitis. We will send her for a CXR today to rule out pneumonia. Treat with Augmentin.  Alysia Penna, MD

## 2017-11-12 ENCOUNTER — Telehealth: Payer: Self-pay | Admitting: Family Medicine

## 2017-11-12 NOTE — Telephone Encounter (Signed)
Copied from [place] [id number]. Topic: Quick Communication - See Telephone Encounter >> Nov 12, 2017 12:16 PM Ether Griffins B wrote: CRM for notification. See Telephone encounter for:  Pt calling about results from chest xray 11/12/17.

## 2017-11-12 NOTE — Telephone Encounter (Signed)
Results have been reviewed by provider. Patient will be contacted when result note is available.

## 2018-01-08 DIAGNOSIS — R1310 Dysphagia, unspecified: Secondary | ICD-10-CM | POA: Diagnosis not present

## 2018-01-08 DIAGNOSIS — J479 Bronchiectasis, uncomplicated: Secondary | ICD-10-CM | POA: Diagnosis not present

## 2018-01-08 DIAGNOSIS — Z86711 Personal history of pulmonary embolism: Secondary | ICD-10-CM | POA: Diagnosis not present

## 2018-01-08 DIAGNOSIS — K225 Diverticulum of esophagus, acquired: Secondary | ICD-10-CM | POA: Diagnosis not present

## 2018-01-08 DIAGNOSIS — I272 Pulmonary hypertension, unspecified: Secondary | ICD-10-CM | POA: Diagnosis not present

## 2018-01-08 DIAGNOSIS — Z86718 Personal history of other venous thrombosis and embolism: Secondary | ICD-10-CM | POA: Diagnosis not present

## 2018-01-08 DIAGNOSIS — J701 Chronic and other pulmonary manifestations due to radiation: Secondary | ICD-10-CM | POA: Diagnosis not present

## 2018-01-08 DIAGNOSIS — C3490 Malignant neoplasm of unspecified part of unspecified bronchus or lung: Secondary | ICD-10-CM | POA: Diagnosis not present

## 2018-01-08 DIAGNOSIS — C3481 Malignant neoplasm of overlapping sites of right bronchus and lung: Secondary | ICD-10-CM | POA: Diagnosis not present

## 2018-01-08 DIAGNOSIS — E7800 Pure hypercholesterolemia, unspecified: Secondary | ICD-10-CM | POA: Diagnosis not present

## 2018-04-21 ENCOUNTER — Other Ambulatory Visit: Payer: Self-pay | Admitting: Family Medicine

## 2018-04-22 NOTE — Telephone Encounter (Signed)
Sent to PCP for approval  Last OV 11/11/2017   Last refilled  04/17/2017 disp 30 with 11 refills

## 2018-05-02 DIAGNOSIS — Z1231 Encounter for screening mammogram for malignant neoplasm of breast: Secondary | ICD-10-CM | POA: Diagnosis not present

## 2018-05-02 LAB — HM MAMMOGRAPHY

## 2018-05-28 ENCOUNTER — Ambulatory Visit (INDEPENDENT_AMBULATORY_CARE_PROVIDER_SITE_OTHER): Payer: Medicare HMO | Admitting: Family Medicine

## 2018-05-28 ENCOUNTER — Encounter: Payer: Self-pay | Admitting: Family Medicine

## 2018-05-28 VITALS — BP 102/76 | HR 86 | Temp 98.2°F | Ht 65.5 in | Wt 143.8 lb

## 2018-05-28 DIAGNOSIS — E538 Deficiency of other specified B group vitamins: Secondary | ICD-10-CM | POA: Diagnosis not present

## 2018-05-28 DIAGNOSIS — J209 Acute bronchitis, unspecified: Secondary | ICD-10-CM

## 2018-05-28 MED ORDER — ALBUTEROL SULFATE HFA 108 (90 BASE) MCG/ACT IN AERS: 2.0000 | INHALATION_SPRAY | RESPIRATORY_TRACT | 11 refills | Status: DC | PRN

## 2018-05-28 MED ORDER — FLUTICASONE PROPIONATE HFA 110 MCG/ACT IN AERO: 2.0000 | INHALATION_SPRAY | Freq: Two times a day (BID) | RESPIRATORY_TRACT | 11 refills | Status: DC

## 2018-05-28 MED ORDER — AMOXICILLIN-POT CLAVULANATE 875-125 MG PO TABS: 1.0000 | ORAL_TABLET | Freq: Two times a day (BID) | ORAL | 0 refills | Status: DC

## 2018-05-28 MED ORDER — BENZONATATE 200 MG PO CAPS: 200.0000 mg | ORAL_CAPSULE | Freq: Two times a day (BID) | ORAL | 5 refills | Status: DC | PRN

## 2018-05-28 MED ORDER — CYANOCOBALAMIN 1000 MCG/ML IJ SOLN
1000.0000 ug | Freq: Once | INTRAMUSCULAR | Status: AC
  Administered 2018-05-28: 1000 ug via INTRAMUSCULAR

## 2018-05-28 NOTE — Progress Notes (Signed)
   Subjective:    Patient ID: [name], female    DOB: [birth year], 72 y.o.   MRN: [id number]  HPI Here for one week of chest tightness and a dry cough, no fever.    Review of Systems  Constitutional: Negative.   HENT: Negative.   Eyes: Negative.   Respiratory: Positive for cough, chest tightness, shortness of breath and wheezing.   Cardiovascular: Negative.        Objective:   Physical Exam  Constitutional: She appears well-developed and well-nourished.  HENT:  Right Ear: External ear normal.  Left Ear: External ear normal.  Nose: Nose normal.  Mouth/Throat: Oropharynx is clear and moist.  Eyes: Conjunctivae are normal.  Neck: No thyromegaly present.  Pulmonary/Chest: Effort normal. No respiratory distress. She has no rales.  Scattered rhonchi and wheezes   Lymphadenopathy:    She has no cervical adenopathy.          Assessment & Plan:  Bronchitis, treat with Augmentin. Refilled her inhalers.  Alysia Penna, MD

## 2018-05-28 NOTE — Addendum Note (Signed)
Addended by: Agnes Lawrence on: 05/28/2018 11:27 AM   Modules accepted: Orders

## 2018-06-02 ENCOUNTER — Telehealth: Payer: Self-pay | Admitting: *Deleted

## 2018-06-02 NOTE — Telephone Encounter (Signed)
Prior auth for Albuterol HFA 89mcg aer sent to [email or web address]-key [id number].

## 2018-06-06 ENCOUNTER — Encounter: Payer: Self-pay | Admitting: *Deleted

## 2018-06-06 NOTE — Telephone Encounter (Signed)
Fax received from [facility] stating the request for Ventolin HFA 70mcg inhaler is available without a prior auth.  A note in [email or web address] states the request for Albuterol was covered also.  I called [facility] and informed Wells Guiles of this and she stated the pt picked up the Rx on 7/3.

## 2018-06-10 ENCOUNTER — Telehealth: Payer: Self-pay

## 2018-06-10 DIAGNOSIS — Z86711 Personal history of pulmonary embolism: Secondary | ICD-10-CM

## 2018-06-10 DIAGNOSIS — I2602 Saddle embolus of pulmonary artery with acute cor pulmonale: Secondary | ICD-10-CM

## 2018-06-10 NOTE — Telephone Encounter (Signed)
Please advise 

## 2018-06-10 NOTE — Telephone Encounter (Signed)
Copied from [place] [id number]. Topic: Referral - Request >> Jun 10, 2018 12:37 PM Hewitt Shorts wrote: Pt is needing a written referral to Dr. Diannia Ruder phone number is [phone] she will need this for a blood test because she takes [place] number [phone]

## 2018-06-10 NOTE — Telephone Encounter (Signed)
Sent to PCP ?

## 2018-06-11 ENCOUNTER — Ambulatory Visit (INDEPENDENT_AMBULATORY_CARE_PROVIDER_SITE_OTHER): Payer: Medicare HMO | Admitting: Family Medicine

## 2018-06-11 DIAGNOSIS — E538 Deficiency of other specified B group vitamins: Secondary | ICD-10-CM

## 2018-06-11 LAB — VITAMIN B12: Vitamin B-12: 349 pg/mL (ref 211–911)

## 2018-06-11 MED ORDER — CYANOCOBALAMIN 1000 MCG/ML IJ SOLN
1000.0000 ug | Freq: Once | INTRAMUSCULAR | Status: AC
  Administered 2018-06-11: 1000 ug via INTRAMUSCULAR

## 2018-06-11 NOTE — Telephone Encounter (Signed)
The referral was done  

## 2018-06-11 NOTE — Progress Notes (Signed)
Per orders of Dr. Sarajane Jews, injection of B12 1000 mcg given by Dorrene German. Patient tolerated injection well.  B12 lab orders placed per Dr. Sarajane Jews and lab were drawn prior to injection.

## 2018-06-11 NOTE — Addendum Note (Signed)
Addended by: Alysia Penna A on: 06/11/2018 05:50 PM   Modules accepted: Orders

## 2018-06-12 NOTE — Telephone Encounter (Signed)
Called and spoke with pt. Pt advised and voiced understanding.  

## 2018-07-08 DIAGNOSIS — J479 Bronchiectasis, uncomplicated: Secondary | ICD-10-CM | POA: Diagnosis not present

## 2018-07-21 ENCOUNTER — Telehealth: Payer: Self-pay | Admitting: Family Medicine

## 2018-07-21 NOTE — Telephone Encounter (Signed)
Copied from [place]. Topic: Quick Communication - See Telephone Encounter >> Jul 21, 2018  9:56 AM Synthia Innocent wrote: CRM for notification. See Telephone encounter for: 07/21/18. Started having UTI symptoms a couple of days ago, Noticed odor in urine, started yesterday with burning while urinating. Offered appt, states she is unable to come in. Requesting something be called in.

## 2018-07-21 NOTE — Telephone Encounter (Signed)
Sent to PCP to advise 

## 2018-07-22 MED ORDER — CIPROFLOXACIN HCL 500 MG PO TABS: 500.0000 mg | ORAL_TABLET | Freq: Two times a day (BID) | ORAL | 0 refills | Status: DC

## 2018-07-22 NOTE — Telephone Encounter (Signed)
Cal in Cipro 500 mg bid for 7 days

## 2018-07-22 NOTE — Telephone Encounter (Signed)
Rx has been sent in. Patient notified.

## 2018-08-08 DIAGNOSIS — J471 Bronchiectasis with (acute) exacerbation: Secondary | ICD-10-CM | POA: Diagnosis not present

## 2018-08-09 ENCOUNTER — Ambulatory Visit (INDEPENDENT_AMBULATORY_CARE_PROVIDER_SITE_OTHER): Payer: Medicare HMO | Admitting: Family Medicine

## 2018-08-09 ENCOUNTER — Encounter: Payer: Self-pay | Admitting: Family Medicine

## 2018-08-09 VITALS — BP 126/82 | HR 87 | Temp 98.1°F | Wt 146.0 lb

## 2018-08-09 DIAGNOSIS — M2550 Pain in unspecified joint: Secondary | ICD-10-CM | POA: Diagnosis not present

## 2018-08-09 DIAGNOSIS — T50905A Adverse effect of unspecified drugs, medicaments and biological substances, initial encounter: Secondary | ICD-10-CM

## 2018-08-09 MED ORDER — METHYLPREDNISOLONE 4 MG PO TBPK: ORAL_TABLET | ORAL | 0 refills | Status: DC

## 2018-08-09 NOTE — Progress Notes (Signed)
[facility] [place], [place] Phone: [phone]  Patient ID: [name] MRN: [id number], DOB: Jun 01, 1946, 72 y.o. Date of Encounter: 08/09/2018  Primary Physician:  Laurey Morale, MD   Chief Complaint:  Chief Complaint  Patient presents with  . Joint Swelling    right-- right arm and hand swelling...x 4 days...Marland Kitchen pt was prescribed Cipro 500mg  BID for 7 days but 750mg  was given... stopped taking Ax x 9 days ago   Subjective:   History of Present Illness:  [name] is a 72 y.o. pleasant patient who presents with the following:  For clarification, patient has not been having global or diffuse swelling, but she has been having some joint swelling and pain for approximately 4 days ago.  No history of trauma or accident.  She has been given a flora quinolone, and she was prescribed ciprofloxacin by pulmonology, and this was for 500 mg p.o. twice daily.  It appears as if she got 750 mg based on her bottle.  She has some generalized achiness and some pain throughout the leg on the right side and to a lesser extent on the left side.  She has pain in her knees, more on the right side, and to a lesser extent in the left side.  She also has some pain to a lesser extent in the ankles as well as in the hips.  The upper extremities appeared to be involved to a much lesser extent.  She has some subjective sensation of some hand swelling more on the right, but she is still able to move her fingers and has some ability to make a full fist.  07/21/2018 - Cipro 500 mg po BID, received 750 mg po bid  S/p lung cancer. Pulmonology was given some abx  Heaviness in the R side and in the R arm. Some pain in the leg on the R side and has some baseline numbness.   R knee - mild hip, flexion to 100 R hip Mild hand rom dec  The PMH, PSH, Social History, Family History, Medications, and allergies have been reviewed in  [facility], and have been updated if relevant.  Review of Systems:  GEN: No acute illnesses, no fevers, chills. GI: No n/v/d, eating normally Pulm: No SOB Interactive and getting along well at home.  Otherwise, ROS is as per the HPI.  Objective:   Physical Examination: BP 126/82   Pulse 87   Temp 98.1 F (36.7 C) (Oral)   Wt 146 lb (66.2 kg)   SpO2 97%   BMI 23.93 kg/m    GEN: WDWN, NAD, Non-toxic, A & O x 3 HEENT: Atraumatic, Normocephalic. Neck supple. No masses, No LAD. Ears and Nose: No external deformity. CV: RRR, No M/G/R. No JVD. No thrill. No extra heart sounds. PULM: CTA B, no wheezes, crackles, rhonchi. No retractions. No resp. distress. No accessory muscle use. EXTR: No c/c/minimal to tr LE edema NEURO Normal gait.  PSYCH: Normally interactive. Conversant. Not depressed or anxious appearing.  Calm demeanor.   Ankles with full range of motion bilaterally and no gross tenderness.  There is no significant pedal edema, trace only.  Right knee has a mild effusion.  Lacks 2 degrees of extension and flexion to 105 degrees.  There is pain with terminal range of motion.  MCL and LCL are stable.  ACL and PCL are stable.  There is modest tenderness along the joint line.  Left knee  has full extension and flexion to 125 degrees.  There is no significant effusion.  Flexion pinch testing and McMurray's are nontender.  Ligaments are all intact.  At the right hip, logrolling causes some tenderness as well as internal and external rotation with the hip flexed to 90 degrees.  Left hip is significantly less provokable with pain response.  Full range of motion at the elbows.    There is some tenderness with full range of motion at the shoulders in all directions.  There is no apparent synovitis in the patient's wrists or any of her DIP, PIP, or MCP joints.  There is some modest tenderness and fullness subjectively, and the patient is able to make a full composite fist, and her strength  and grip is intact.  Laboratory and Imaging Data:  Assessment & Plan:   Polyarthralgia  Medication reaction, initial encounter  Polyarthralgia most likely secondary to acute fluoroquinolone use and the timing coincides with this use.  This is uncommon side effect, but it is documented and well-known.  She has stopped this medication on her own, and I would recommend avoidance of fluoroquinolones in the future.  I am placing her on a Medrol Dosepak and have her start some Zyrtec.  Follow-up: Return in about 3 days (around 08/12/2018).   Patient Instructions  Get some over the counter Zyrtec and take 1 tablet a day.  This is generic certrizine 10 mg.    Meds ordered this encounter  Medications  . methylPREDNISolone (MEDROL DOSEPAK) 4 MG TBPK tablet    Sig: Take as directed    Dispense:  21 tablet    Refill:  0    Signed,  Jaylnn Ullery T. Sosha Shepherd, MD   Allergies as of 08/09/2018      Reactions   Ciprofloxacin Other (See Comments)   Diffuse polyarthropathy, joint effusions, pain across UE and LE with Cipro 750 mg. Fluoroquinolone class avoidance recommended.    Prednisone    Did NOT tolerate well   Zofran [ondansetron Hcl]    Patient states she should not take Zofran due to her heart.   Morphine And Related Other (See Comments)   Hard time waking up when taking morphine, codeine, and related medications      Medication List        Accurate as of 08/09/18 11:59 PM. Always use your most recent med list.          acetaminophen 500 MG tablet Commonly known as:  TYLENOL Take 1,000 mg by mouth every 6 (six) hours as needed for mild pain.   albuterol (2.5 MG/3ML) 0.083% nebulizer solution Commonly known as:  PROVENTIL Take 3 mLs (2.5 mg total) by nebulization every 4 (four) hours as needed for wheezing or shortness of breath.   albuterol 108 (90 Base) MCG/ACT inhaler Commonly known as:  PROVENTIL HFA;VENTOLIN HFA Inhale 2 puffs into the lungs every 4 (four) hours as  needed for wheezing or shortness of breath.   benzonatate 200 MG capsule Commonly known as:  TESSALON Take 1 capsule (200 mg total) by mouth 2 (two) times daily as needed for cough.   fluticasone 110 MCG/ACT inhaler Commonly known as:  FLOVENT HFA Inhale 2 puffs into the lungs 2 (two) times daily. 2 puffs   methylPREDNISolone 4 MG Tbpk tablet Commonly known as:  MEDROL DOSEPAK Take as directed   potassium chloride 10 MEQ tablet Commonly known as:  K-DUR Take 1 tablet (10 mEq total) by mouth daily.   sodium chloride HYPERTONIC 3 %  nebulizer solution Take by nebulization every 4 (four) hours as needed for cough.   XARELTO 20 MG Tabs tablet Generic drug:  rivaroxaban TAKE 1 TABLET BY MOUTH ONCE DAILY WITH SUPPER

## 2018-08-09 NOTE — Patient Instructions (Signed)
Get some over the counter Zyrtec and take 1 tablet a day.  This is generic certrizine 10 mg.

## 2018-08-10 ENCOUNTER — Encounter: Payer: Self-pay | Admitting: Family Medicine

## 2018-08-12 ENCOUNTER — Ambulatory Visit (INDEPENDENT_AMBULATORY_CARE_PROVIDER_SITE_OTHER): Payer: Medicare HMO | Admitting: Family Medicine

## 2018-08-12 ENCOUNTER — Encounter: Payer: Self-pay | Admitting: Family Medicine

## 2018-08-12 ENCOUNTER — Telehealth: Payer: Self-pay | Admitting: Family Medicine

## 2018-08-12 VITALS — BP 120/80 | HR 86 | Temp 98.1°F | Wt 145.4 lb

## 2018-08-12 DIAGNOSIS — T50905D Adverse effect of unspecified drugs, medicaments and biological substances, subsequent encounter: Secondary | ICD-10-CM | POA: Diagnosis not present

## 2018-08-12 DIAGNOSIS — J471 Bronchiectasis with (acute) exacerbation: Secondary | ICD-10-CM | POA: Diagnosis not present

## 2018-08-12 NOTE — Progress Notes (Signed)
   Subjective:    Patient ID: [name], female    DOB: [birth year], 71 y.o.   MRN: [id number]  HPI Here to follow up on a chain of events. On 07-08-18 she saw Dr. Posey Pronto, her pulmonologist at [facility], who obtained a sputum culture which grew Pseudomonas Aeruginosa. He called in a rx for Cipro 750 mg to take bid. She took this for 5 days and her cough improved, but she developed swelling and pain in her hands and her legs. She saw Dr. Lorelei Pont at the [facility] on 08-09-18, and he told her she was having a reaction to the Cipro. He stopped the Cipro and gave her a Medrol dose pack. Now her joints feel much better and she is almost back to baseline. The cough has resolved.    Review of Systems  Constitutional: Negative.   HENT: Positive for congestion. Negative for postnasal drip, sinus pressure, sinus pain and sore throat.   Eyes: Negative.   Respiratory: Positive for shortness of breath. Negative for cough.   Cardiovascular: Negative.   Musculoskeletal: Negative for arthralgias.       Objective:   Physical Exam  Constitutional: She is oriented to person, place, and time. She appears well-developed and well-nourished. No distress.  Cardiovascular: Normal rate, regular rhythm, normal heart sounds and intact distal pulses.  Pulmonary/Chest: Effort normal. No stridor. No respiratory distress. She has no wheezes. She has no rales. She exhibits no tenderness.  Musculoskeletal: She exhibits no edema.  Neurological: She is alert and oriented to person, place, and time.          Assessment & Plan:  She had a reaction to high dose Cipro, but this appears to have resolved with the steroid taper. She is no longer coughing so the bronchitis has resolved. Recheck prn. Alysia Penna, MD

## 2018-08-12 NOTE — Telephone Encounter (Signed)
Dr. Fry please advise. Thanks  

## 2018-08-12 NOTE — Telephone Encounter (Signed)
Copied from [place] [id number] [id number]. Topic: Quick Communication - Rx Refill/Question >> Aug 12, 2018  1:05 PM Scherrie Gerlach wrote: Medication: buPROPion (WELLBUTRIN XL) 150 MG 24 hr tablet  Pt states she just had an appt at 11 am today and forgot to ask Dr Sarajane Jews if he would prescribe this for her again. Pt states she is having some depression issues and this had helped in the past. Just feeling like she may need again for a little while.  [place], [place] - [address] [phone] (Phone) [phone] (Fax)

## 2018-08-13 MED ORDER — BUPROPION HCL ER (XL) 150 MG PO TB24: 150.0000 mg | ORAL_TABLET | Freq: Every day | ORAL | 5 refills | Status: DC

## 2018-08-13 NOTE — Telephone Encounter (Signed)
I have sent in the medication to the pharmacy.  I have called and lmomtcb x 1 for the pt to call back to let her know if the med sent in .

## 2018-08-13 NOTE — Telephone Encounter (Signed)
Yes, call in Wellbutrin XL 150 mg daily, #30 with 5 rf

## 2018-08-14 NOTE — Telephone Encounter (Signed)
Called and spoke with pt and she is aware of meds at the pharmacy. Nothing further is needed.

## 2018-09-23 ENCOUNTER — Ambulatory Visit: Payer: Medicare HMO

## 2018-10-07 ENCOUNTER — Ambulatory Visit (INDEPENDENT_AMBULATORY_CARE_PROVIDER_SITE_OTHER): Payer: Medicare HMO | Admitting: Family Medicine

## 2018-10-07 ENCOUNTER — Encounter: Payer: Self-pay | Admitting: Family Medicine

## 2018-10-07 VITALS — BP 120/84 | HR 98 | Temp 97.7°F | Wt 149.0 lb

## 2018-10-07 DIAGNOSIS — M170 Bilateral primary osteoarthritis of knee: Secondary | ICD-10-CM

## 2018-10-07 DIAGNOSIS — R600 Localized edema: Secondary | ICD-10-CM | POA: Diagnosis not present

## 2018-10-07 DIAGNOSIS — Z23 Encounter for immunization: Secondary | ICD-10-CM | POA: Diagnosis not present

## 2018-10-07 MED ORDER — BENZONATATE 200 MG PO CAPS: 200.0000 mg | ORAL_CAPSULE | Freq: Two times a day (BID) | ORAL | 3 refills | Status: DC | PRN

## 2018-10-07 NOTE — Progress Notes (Signed)
   Subjective:    Patient ID: [name], female    DOB: 1946-02-27, 72 y.o.   MRN: [id number]  HPI Here with concern about swelling and pain in the legs. She has a hx of DVT and is on Xarelto. The right leg always swells more than the left. No chest pain or SOB lately. She also describes sharp pains in the knees off and on. Sometimes the knees feels like they will give way also, but she has not fallen.    Review of Systems  Constitutional: Negative.   Respiratory: Negative.   Cardiovascular: Positive for leg swelling. Negative for chest pain and palpitations.  Musculoskeletal: Positive for arthralgias and gait problem.       Objective:   Physical Exam  Constitutional: She is oriented to person, place, and time. She appears well-developed and well-nourished. No distress.  Cardiovascular: Normal rate, regular rhythm, normal heart sounds and intact distal pulses.  Pulmonary/Chest: Effort normal. No stridor. No respiratory distress. She has no rales.  Soft scattered wheezes   Musculoskeletal:  The right lower leg has 1+ edema and the left lower leg has trace edema. Both knees are tender in the joint spaces, full ROM   Neurological: She is alert and oriented to person, place, and time.          Assessment & Plan:  She has chronic leg edema and now seems to have osteoarthritis pain in the knees. She is taking Tylenol and she does not want a pain medication per se right now. We will set up dopplers of both legs since this has not been done for almost 3 years.  Alysia Penna, MD

## 2018-10-08 ENCOUNTER — Ambulatory Visit (INDEPENDENT_AMBULATORY_CARE_PROVIDER_SITE_OTHER): Payer: Medicare HMO | Admitting: Family Medicine

## 2018-10-08 ENCOUNTER — Encounter: Payer: Self-pay | Admitting: Family Medicine

## 2018-10-08 ENCOUNTER — Ambulatory Visit: Payer: Self-pay

## 2018-10-08 VITALS — BP 106/70 | HR 84 | Temp 97.8°F | Wt 148.1 lb

## 2018-10-08 DIAGNOSIS — R2232 Localized swelling, mass and lump, left upper limb: Secondary | ICD-10-CM | POA: Diagnosis not present

## 2018-10-08 DIAGNOSIS — T50Z95A Adverse effect of other vaccines and biological substances, initial encounter: Secondary | ICD-10-CM

## 2018-10-08 DIAGNOSIS — L539 Erythematous condition, unspecified: Secondary | ICD-10-CM

## 2018-10-08 NOTE — Telephone Encounter (Signed)
Pt called stating that she had the flu shot yesterday and by evening she was extremely chilled and she had body aches. Her arm swelled and she had swelling all over. She has lung CA and was given the stronger version of the flu vaccine. She has not had chemo or radiation for 5 years. She has been told her immune system is bad. She is better today but still has aches. She has not taken her temperature. She states her cheeks are red. No redness at injection site and swelling is going down today. Appointment scheduled per protocol. Care advice read to patient. Pt verbalized understanding of all instructions.  Reason for Disposition . [1] Pain, tenderness, or swelling at the injection site AND [2] persists > 3 days  Answer Assessment - Initial Assessment Questions 1. SYMPTOMS: "What is the main symptom?" (e.g., redness, swelling, pain)      Swelling and pain 2. ONSET: "When was the vaccine (shot) given?" "How much later did the last night__ begin?" (e.g., hours, days ago)      Getting better 3. SEVERITY: "How bad is it?"      Very bad 4. FEVER: "Is there a fever?" If so, ask: "What is it, how was it measured, and when did it start?"      Not sure but extremely chilled last night 5. IMMUNIZATIONS GIVEN: "What shots have you recently received?"     Flu vac 6. PAST REACTIONS: "Have you reacted to immunizations before?" If so, ask: "What happened?"     Yes not with flu shot 7. OTHER SYMPTOMS: "Do you have any other symptoms?"     Body aches  Protocols used: IMMUNIZATION REACTIONS-A-AH

## 2018-10-08 NOTE — Telephone Encounter (Signed)
noted 

## 2018-10-08 NOTE — Progress Notes (Signed)
   Subjective:    Patient ID: [name], female    DOB: Apr 26, 1946, 72 y.o.   MRN: [id number]  HPI Here for a possible reaction to the flu shot she was given her yesterday. This was a high dose vaccine, which she has never had before. She has never reacted to regular dose vaccines. This morning her left arm (the shot site) was swollen and red and warm and very painful, and her body ached all over. No fever. Now this has actually improved quite a bit. She is taking Tylenol. No SOB.    Review of Systems  Constitutional: Negative.   Respiratory: Positive for cough. Negative for shortness of breath and wheezing.   Cardiovascular: Negative.   Musculoskeletal: Positive for myalgias.  Skin: Positive for color change.  Neurological: Negative.        Objective:   Physical Exam  Constitutional: She is oriented to person, place, and time. She appears well-developed and well-nourished. No distress.  Cardiovascular: Normal rate, regular rhythm, normal heart sounds and intact distal pulses.  Pulmonary/Chest: Effort normal and breath sounds normal. No stridor. No respiratory distress. She has no wheezes. She has no rales.  Neurological: She is alert and oriented to person, place, and time.  Skin:  The lateral left upper arm is pink, warm, slightly swollen and mildly tender          Assessment & Plan:  This is all a reaction to the high dose influenza vaccine. She will  rest and drink fluids. Apply ice to the left upper arm. Take Tylenol prn. She will stick to regular dose flu vaccines in the future.  Alysia Penna, MD

## 2018-10-09 ENCOUNTER — Ambulatory Visit (HOSPITAL_COMMUNITY)
Admission: RE | Admit: 2018-10-09 | Payer: Medicare HMO | Source: Ambulatory Visit | Attending: Family Medicine | Admitting: Family Medicine

## 2018-10-15 ENCOUNTER — Encounter (HOSPITAL_COMMUNITY): Payer: Self-pay

## 2018-10-15 ENCOUNTER — Ambulatory Visit (HOSPITAL_COMMUNITY)
Admission: RE | Admit: 2018-10-15 | Discharge: 2018-10-15 | Disposition: A | Discharge status: Home / Self Care | Payer: Medicare HMO | Source: Ambulatory Visit | Attending: Internal Medicine | Admitting: Internal Medicine

## 2018-10-15 DIAGNOSIS — R600 Localized edema: Secondary | ICD-10-CM | POA: Diagnosis not present

## 2018-10-15 NOTE — Progress Notes (Unsigned)
Today's bilateral lower extremity venous duplex is negative for DVT. Preliminary results given to Dr. Sarajane Jews at 11:08.

## 2018-11-05 ENCOUNTER — Ambulatory Visit (INDEPENDENT_AMBULATORY_CARE_PROVIDER_SITE_OTHER): Payer: Medicare HMO

## 2018-11-05 ENCOUNTER — Ambulatory Visit (INDEPENDENT_AMBULATORY_CARE_PROVIDER_SITE_OTHER): Payer: Medicare HMO | Admitting: Family Medicine

## 2018-11-05 ENCOUNTER — Encounter: Payer: Self-pay | Admitting: Family Medicine

## 2018-11-05 VITALS — BP 100/72 | HR 96 | Temp 98.2°F | Wt 144.4 lb

## 2018-11-05 DIAGNOSIS — I10 Essential (primary) hypertension: Secondary | ICD-10-CM

## 2018-11-05 DIAGNOSIS — M1711 Unilateral primary osteoarthritis, right knee: Secondary | ICD-10-CM | POA: Diagnosis not present

## 2018-11-05 DIAGNOSIS — M79604 Pain in right leg: Secondary | ICD-10-CM | POA: Diagnosis not present

## 2018-11-05 DIAGNOSIS — M79605 Pain in left leg: Secondary | ICD-10-CM

## 2018-11-05 DIAGNOSIS — M79661 Pain in right lower leg: Secondary | ICD-10-CM | POA: Diagnosis not present

## 2018-11-05 DIAGNOSIS — M79662 Pain in left lower leg: Secondary | ICD-10-CM | POA: Diagnosis not present

## 2018-11-05 LAB — CBC WITH DIFFERENTIAL/PLATELET
Basophils Absolute: 0 10*3/uL (ref 0.0–0.1)
Basophils Relative: 0.6 % (ref 0.0–3.0)
Eosinophils Absolute: 0.1 10*3/uL (ref 0.0–0.7)
Eosinophils Relative: 1.3 % (ref 0.0–5.0)
HEMATOCRIT: 44 % (ref 36.0–46.0)
Hemoglobin: 15.1 g/dL — ABNORMAL HIGH (ref 12.0–15.0)
Lymphocytes Relative: 14.9 % (ref 12.0–46.0)
Lymphs Abs: 1 10*3/uL (ref 0.7–4.0)
MCHC: 34.4 g/dL (ref 30.0–36.0)
MCV: 89 fl (ref 78.0–100.0)
Monocytes Absolute: 0.5 10*3/uL (ref 0.1–1.0)
Monocytes Relative: 7.3 % (ref 3.0–12.0)
Neutro Abs: 4.9 10*3/uL (ref 1.4–7.7)
Neutrophils Relative %: 75.9 % (ref 43.0–77.0)
Platelets: 220 10*3/uL (ref 150.0–400.0)
RBC: 4.95 Mil/uL (ref 3.87–5.11)
RDW: 13.6 % (ref 11.5–15.5)
WBC: 6.5 10*3/uL (ref 4.0–10.5)

## 2018-11-05 LAB — BASIC METABOLIC PANEL
BUN: 18 mg/dL (ref 6–23)
CALCIUM: 10 mg/dL (ref 8.4–10.5)
CO2: 30 mEq/L (ref 19–32)
Chloride: 105 mEq/L (ref 96–112)
Creatinine, Ser: 0.9 mg/dL (ref 0.40–1.20)
GFR: 65.1 mL/min (ref >60.00)
Glucose, Bld: 74 mg/dL (ref 70–99)
Potassium: 4.7 mEq/L (ref 3.5–5.1)
Sodium: 141 mEq/L (ref 135–145)

## 2018-11-05 LAB — HEPATIC FUNCTION PANEL
ALT: 9 U/L (ref 0–35)
AST: 14 U/L (ref 0–37)
Albumin: 4.5 g/dL (ref 3.5–5.2)
Alkaline Phosphatase: 53 U/L (ref 39–117)
Bilirubin, Direct: 0.1 mg/dL (ref 0.0–0.3)
Total Bilirubin: 0.5 mg/dL (ref 0.2–1.2)
Total Protein: 7 g/dL (ref 6.0–8.3)

## 2018-11-05 LAB — POC URINALSYSI DIPSTICK (AUTOMATED)
Bilirubin, UA: POSITIVE
Blood, UA: NEGATIVE
Glucose, UA: NEGATIVE
Ketones, UA: POSITIVE
Nitrite, UA: NEGATIVE
Protein, UA: POSITIVE — AB
Spec Grav, UA: >=1.03 — AB (ref 1.010–1.025)
Urobilinogen, UA: 0.2 E.U./dL
pH, UA: 5.5 (ref 5.0–8.0)

## 2018-11-05 LAB — TSH: TSH: 1.15 u[IU]/mL (ref 0.35–4.50)

## 2018-11-05 LAB — T3, FREE: T3, Free: 3.2 pg/mL (ref 2.3–4.2)

## 2018-11-05 LAB — T4, FREE: Free T4: 0.71 ng/dL (ref 0.60–1.60)

## 2018-11-05 NOTE — Progress Notes (Signed)
   Subjective:    Patient ID: [name], female    DOB: July 15, 1946, 72 y.o.   MRN: [id number]  HPI Here for continuing pain in the legs. This started in April but was mild for months. Then about 2 months ago she developed some swelling in the legs and the pain has become much worse. She has some mild lower back pain and some pain in the right upper arm. She describes the pain as a dull ache that "feels like it is in my bones". She had venous dopplers in both legs which ruled out any DVT's. She takes Tylenol off and on for this. She is worried about possible bone cancer, because she was told by her Oncologist that this could be a side effect down the road after she had her chemotherapy for lung cancer.    Review of Systems  Constitutional: Negative.   Respiratory: Negative.   Cardiovascular: Negative.   Musculoskeletal: Positive for myalgias.  Neurological: Negative.        Objective:   Physical Exam  Constitutional: She appears well-developed and well-nourished. No distress.  HENT:  Right Ear: External ear normal.  Left Ear: External ear normal.  Nose: Nose normal.  Mouth/Throat: Oropharynx is clear and moist.  Neck: No thyromegaly present.  Cardiovascular: Normal rate, regular rhythm, normal heart sounds and intact distal pulses.  Pulmonary/Chest: Effort normal and breath sounds normal.  Musculoskeletal:  She has 1+ edema in both legs, but there is no tenderness, no warmth. ROM is full   Lymphadenopathy:    She has no cervical adenopathy.          Assessment & Plan:  Leg pain of uncertain etiology. We will get labs and Xrays of the legs today. Of note, she says her twin sister has fibromyalgia and multiple sclerosis. A diagnosis of fibromyalgia would be a possibility.  Alysia Penna, MD

## 2018-11-06 ENCOUNTER — Telehealth: Payer: Self-pay

## 2018-11-06 NOTE — Telephone Encounter (Signed)
Copied from [place] [id number]. Topic: General - Other >> Nov 06, 2018  3:29 PM [name] R wrote: Patient called in for results of imaging

## 2018-11-07 MED ORDER — AZITHROMYCIN 250 MG PO TABS: ORAL_TABLET | ORAL | 0 refills | Status: DC

## 2018-11-07 NOTE — Telephone Encounter (Signed)
Call in a Zpack  ?

## 2018-11-07 NOTE — Telephone Encounter (Signed)
I have called and spoke with pt and she is aware of results.    Pt stated that she is now running a fever and having nasal congestion and she has no voice. She is wanting to see if Dr. Sarajane Jews can send an abx in for her so she does not get worse over the weekend.  Dr. Sarajane Jews please advise. Thanks

## 2018-11-07 NOTE — Telephone Encounter (Signed)
Patient calling to check and see if [name] was available. States that she would really like her results today, if possible. Does not want to wait the weekend to find out these results due to what they were pertaining to.

## 2018-11-07 NOTE — Telephone Encounter (Signed)
lmomtcb x 1 for the pt to discuss her results.

## 2018-11-07 NOTE — Telephone Encounter (Signed)
Pt returned missed phone call for her results

## 2018-12-16 DIAGNOSIS — J479 Bronchiectasis, uncomplicated: Secondary | ICD-10-CM | POA: Diagnosis not present

## 2019-02-12 ENCOUNTER — Telehealth: Payer: Self-pay

## 2019-02-12 MED ORDER — RIVAROXABAN 20 MG PO TABS: ORAL_TABLET | ORAL | 11 refills | Status: DC

## 2019-02-12 NOTE — Telephone Encounter (Signed)
Copied from [place] [id number]. Topic: General - Other >> Feb 12, 2019 12:18 PM Carolyn Stare wrote:  Pt call to ask if a RX can be sent to [facility] at [address] ,she said she prefer not to go inside [facility]   XARELTO 20 MG TABS tablet

## 2019-02-12 NOTE — Telephone Encounter (Signed)
Called the pt and she is aware of rx that has been sent to the [facility] on [address] per her request.

## 2019-03-09 ENCOUNTER — Encounter: Payer: Self-pay | Admitting: Family Medicine

## 2019-03-09 ENCOUNTER — Telehealth: Payer: Self-pay | Admitting: Family Medicine

## 2019-03-09 ENCOUNTER — Ambulatory Visit (INDEPENDENT_AMBULATORY_CARE_PROVIDER_SITE_OTHER): Payer: Medicare HMO | Admitting: Family Medicine

## 2019-03-09 ENCOUNTER — Other Ambulatory Visit: Payer: Self-pay

## 2019-03-09 DIAGNOSIS — N390 Urinary tract infection, site not specified: Secondary | ICD-10-CM | POA: Diagnosis not present

## 2019-03-09 MED ORDER — NITROFURANTOIN MONOHYD MACRO 100 MG PO CAPS: 100.0000 mg | ORAL_CAPSULE | Freq: Two times a day (BID) | ORAL | 0 refills | Status: DC

## 2019-03-09 NOTE — Telephone Encounter (Signed)
Called and spoke with pt and she is aware of appt today with Dr. Sarajane Jews.

## 2019-03-09 NOTE — Telephone Encounter (Signed)
Patient's husband states pt feels like she has a bladder infection.  This is a repetitive issue.  He states Dr Sarajane Jews usually calls in a prescription when this happens.

## 2019-03-09 NOTE — Progress Notes (Signed)
Subjective:    Patient ID: [name], female    DOB: 1946/07/27, 73 y.o.   MRN: [id number]  HPI Virtual Visit via Video Note  I connected with the patient on 03/09/19 at 10:45 AM EDT by a video enabled telemedicine application and verified that I am speaking with the correct person using two identifiers.  Location patient: home Location provider:work or home office Persons participating in the virtual visit: patient, provider  I discussed the limitations of evaluation and management by telemedicine and the availability of in person appointments. The patient expressed understanding and agreed to proceed.   HPI: She thinks she has a UTI. She has had a low grade fever for about 3 weeks with no other symptoms. Then yesterday she developed urgency and frequency to urinate, often producing little to no urine. Now today there is burning. She is drinking lots of water. No blood has been seen.    ROS: See pertinent positives and negatives per HPI.  Past Medical History:  Diagnosis Date  . Anemia   . Cancer ([place])   . Cataract   . Clotting disorder ([place])   . Community acquired pneumonia 01/22/2013  . DVT (deep venous thrombosis) ([facility])    had 2 in right calf & 1 in the left calf  . Fibromyalgia   . Hyperlipidemia   . Hypertension   . Peripheral vascular disease ([place])   . PMR (polymyalgia rheumatica) ([place]) 10/31/2017  . PONV (postoperative nausea and vomiting)   . Squamous cell lung cancer [facility]) 2013   sees Dr. Elmon Kirschner at [facility] and Dr. Jaye Beagle at [place]   . Tobacco abuse     Past Surgical History:  Procedure Laterality Date  . APPENDECTOMY    . CHOLECYSTECTOMY    . COLONOSCOPY     11/28/09 repeat in 10 years  . ESOPHAGOGASTRODUODENOSCOPY  October 2013   severe esophagitis, done at [facility] [place]   . KNEE ARTHROSCOPY     right  . LAMINECTOMY     lumbar  . PORTACATH PLACEMENT    . TONSILLECTOMY    . VIDEO BRONCHOSCOPY  05/30/2012   Procedure: VIDEO  BRONCHOSCOPY WITH FLUORO;  Surgeon: Elsie Stain, MD;  Location: [facility];  Service: Cardiopulmonary;  Laterality: N/A;    Family History  Problem Relation Age of Onset  . Cancer Father        NHL  . Hypertension Mother        family history  . Atrial fibrillation Mother   . Cancer Mother        Lung  . Anemia Unknown        FE deficiency   . Arthritis Unknown        family history  . Cancer Unknown        colon 1st degree relative <60  . Multiple sclerosis Sister      Current Outpatient Medications:  .  acetaminophen (TYLENOL) 500 MG tablet, Take 1,000 mg by mouth every 6 (six) hours as needed for mild pain., Disp: , Rfl:  .  albuterol (PROVENTIL HFA;VENTOLIN HFA) 108 (90 Base) MCG/ACT inhaler, Inhale 2 puffs into the lungs every 4 (four) hours as needed for wheezing or shortness of breath. (Patient not taking: Reported on 03/09/2019), Disp: 1 Inhaler, Rfl: 11 .  albuterol (PROVENTIL) (2.5 MG/3ML) 0.083% nebulizer solution, Take 3 mLs (2.5 mg total) by nebulization every 4 (four) hours as needed for wheezing or shortness of breath., Disp: 75 mL, Rfl:  12 .  azithromycin (ZITHROMAX) 250 MG tablet, Take 2 tablets by mouth today then 1 daily until gone. (Patient not taking: Reported on 03/09/2019), Disp: 6 tablet, Rfl: 0 .  benzonatate (TESSALON) 200 MG capsule, Take 1 capsule (200 mg total) by mouth 2 (two) times daily as needed for cough. (Patient not taking: Reported on 03/09/2019), Disp: 180 capsule, Rfl: 3 .  buPROPion (WELLBUTRIN XL) 150 MG 24 hr tablet, Take 1 tablet (150 mg total) by mouth daily. (Patient not taking: Reported on 03/09/2019), Disp: 30 tablet, Rfl: 5 .  fluticasone (FLOVENT HFA) 110 MCG/ACT inhaler, Inhale 2 puffs into the lungs 2 (two) times daily. 2 puffs (Patient not taking: Reported on 03/09/2019), Disp: 1 Inhaler, Rfl: 11 .  nitrofurantoin, macrocrystal-monohydrate, (MACROBID) 100 MG capsule, Take 1 capsule (100 mg total) by mouth 2 (two) times daily., Disp:  14 capsule, Rfl: 0 .  potassium chloride (KLOR-CON 10) 10 MEQ tablet, Take 1 tablet (10 mEq total) by mouth daily. (Patient not taking: Reported on 03/09/2019), Disp: 90 tablet, Rfl: 3 .  rivaroxaban (XARELTO) 20 MG TABS tablet, TAKE 1 TABLET BY MOUTH ONCE DAILY WITH SUPPER, Disp: 30 tablet, Rfl: 11 .  sodium chloride HYPERTONIC 3 % nebulizer solution, Take by nebulization every 4 (four) hours as needed for cough., Disp: 750 mL, Rfl: 12  EXAM:  VITALS per patient if applicable:  GENERAL: alert, oriented, appears well and in no acute distress  HEENT: atraumatic, conjunttiva clear, no obvious abnormalities on inspection of external nose and ears  NECK: normal movements of the head and neck  LUNGS: on inspection no signs of respiratory distress, breathing rate appears normal, no obvious gross SOB, gasping or wheezing  CV: no obvious cyanosis  MS: moves all visible extremities without noticeable abnormality  PSYCH/NEURO: pleasant and cooperative, no obvious depression or anxiety, speech and thought processing grossly intact  ASSESSMENT AND PLAN: UTI, treat with Macrobid. Recheck prn.  Alysia Penna, MD  Discussed the following assessment and plan:  No diagnosis found.     I discussed the assessment and treatment plan with the patient. The patient was provided an opportunity to ask questions and all were answered. The patient agreed with the plan and demonstrated an understanding of the instructions.   The patient was advised to call back or seek an in-person evaluation if the symptoms worsen or if the condition fails to improve as anticipated.     Review of Systems     Objective:   Physical Exam        Assessment & Plan:

## 2019-05-11 ENCOUNTER — Telehealth: Payer: Self-pay | Admitting: Family Medicine

## 2019-05-11 MED ORDER — BUPROPION HCL ER (XL) 150 MG PO TB24: 150.0000 mg | ORAL_TABLET | Freq: Every day | ORAL | 11 refills | Status: DC

## 2019-05-11 NOTE — Telephone Encounter (Signed)
Dr. Fry please advise. Thanks  

## 2019-05-11 NOTE — Telephone Encounter (Signed)
Copied from [place] [id number]. Topic: Quick Communication - Rx Refill/Question >> May 11, 2019 11:00 AM [name] wrote: Medication: buPROPion (WELLBUTRIN XL) 150 MG 24 hr tablet - Pt has not taken in a while and wanted to know if she can have a refill called in for her / please advise   Has the patient contacted their pharmacy? No   Preferred Pharmacy (with phone number or street name): [facility] [place], [address] AT [address] [place] [phone] (Phone) [phone] (Fax)    Agent: Please be advised that RX refills may take up to 3 business days. We ask that you follow-up with your pharmacy.

## 2019-05-11 NOTE — Telephone Encounter (Signed)
Call in #[phone] with 11 rf

## 2019-06-06 IMAGING — CT CT CHEST W/ CM
2 of 3 series · 15 of 36 positions shown, 18 images · IV contrast (ISOVUE 300)
Comparison: 12/24/2016

CLINICAL DATA: Dry cough worsening over the last 2 months. History
of lung cancer.

EXAM:
CT CHEST WITH CONTRAST
TECHNIQUE: Multidetector CT imaging of the chest was performed during
intravenous contrast administration.
CONTRAST:  80mL DIRUL4-EJJ IOPAMIDOL (DIRUL4-EJJ) INJECTION 61%

[Series 4: thorax · axial · 0.67mm/px · z∈[-278,-30]mm · 12 of 146 slices shown, 15 images]
[im 11/146  mediastinal]
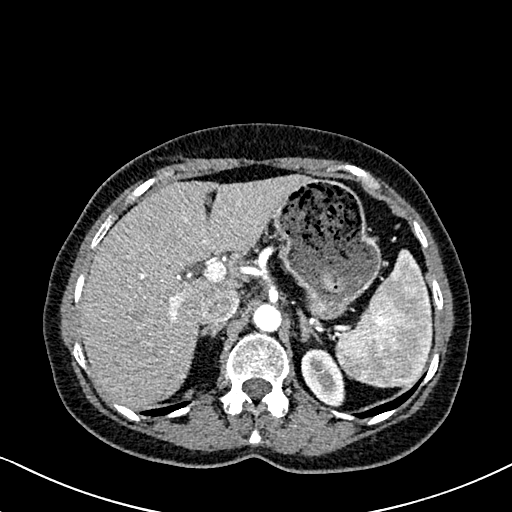
[im 11/146  lung]
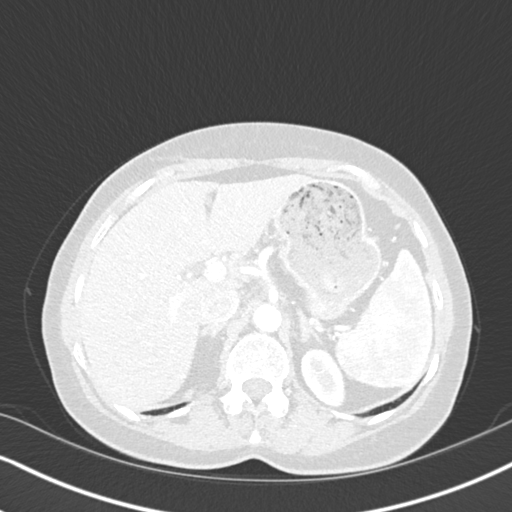
[im 22/146  lung]
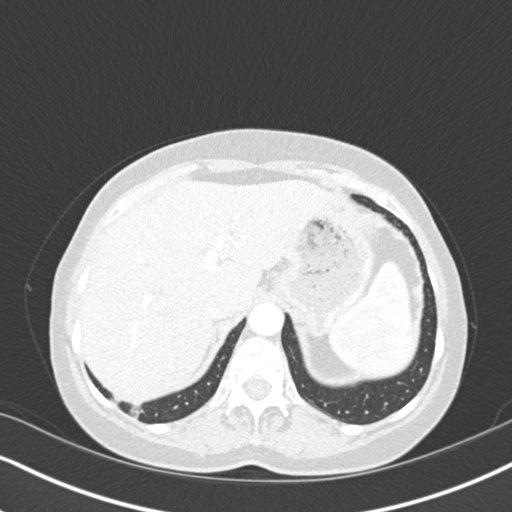
[im 33/146  lung]
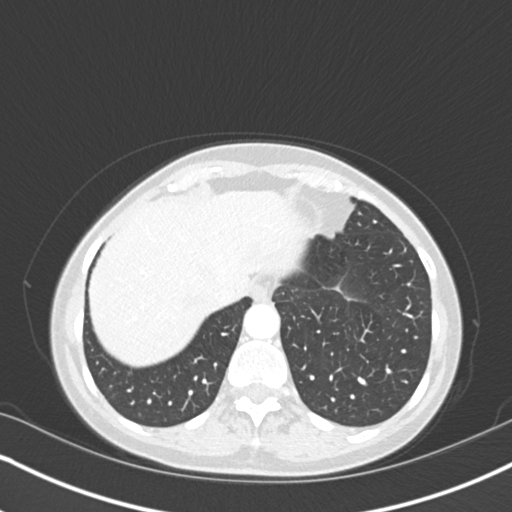
[im 43/146  lung]
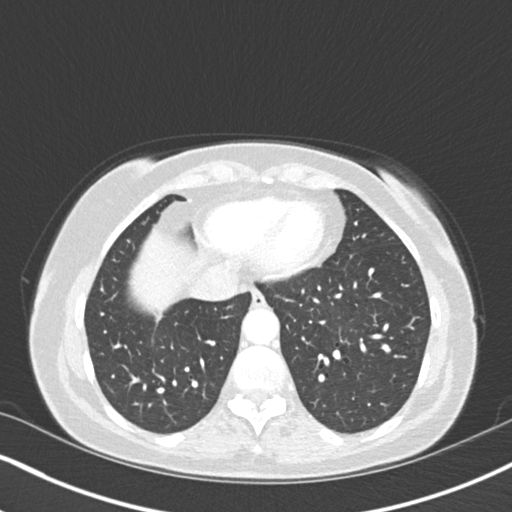
[im 54/146  mediastinal]
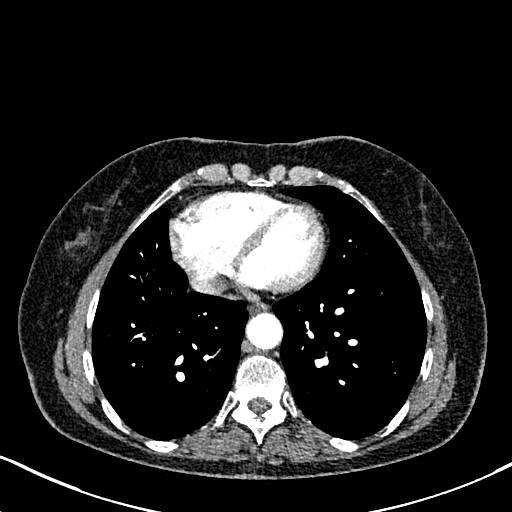
[im 54/146  lung]
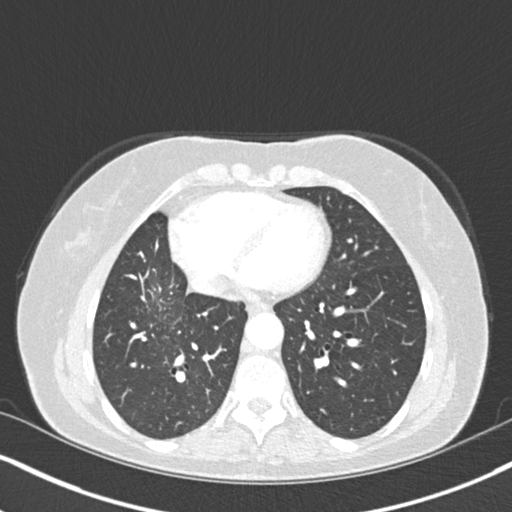
[im 65/146  lung]
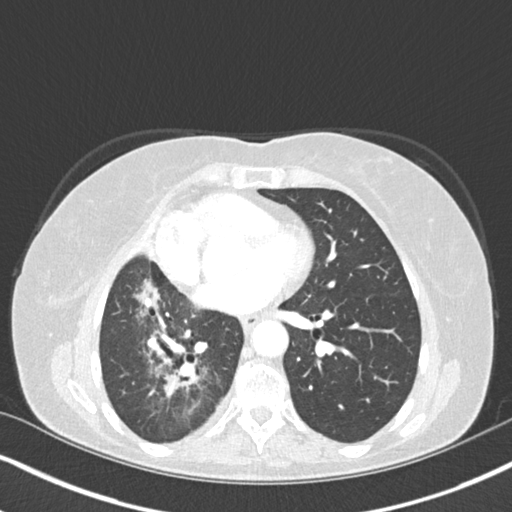
[im 81/146  lung]
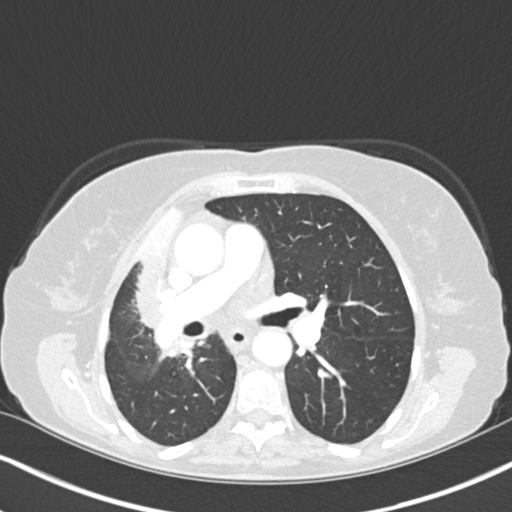
[im 92/146  lung]
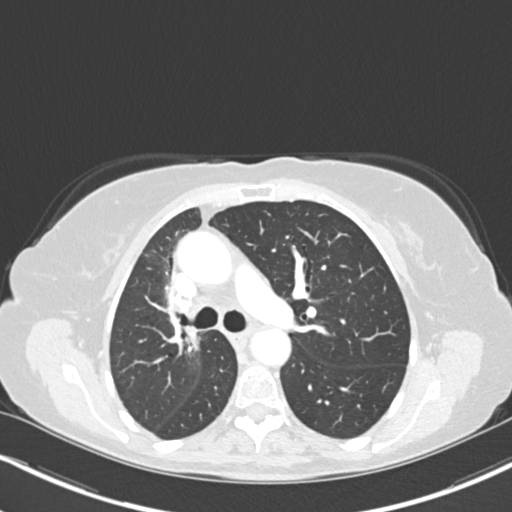
[im 103/146  mediastinal]
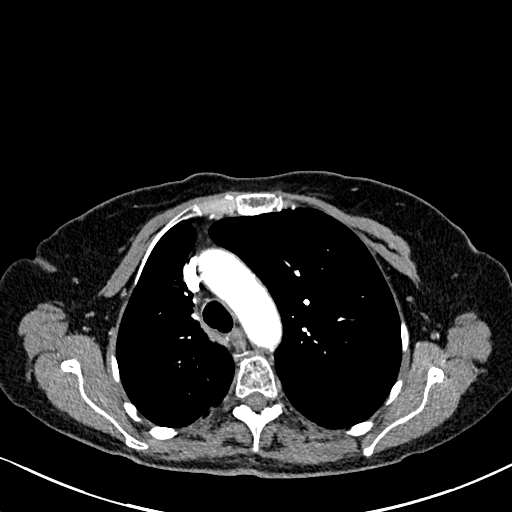
[im 103/146  lung]
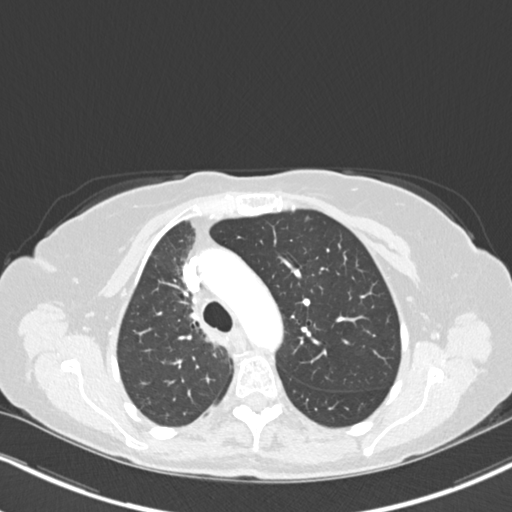
[im 113/146  lung]
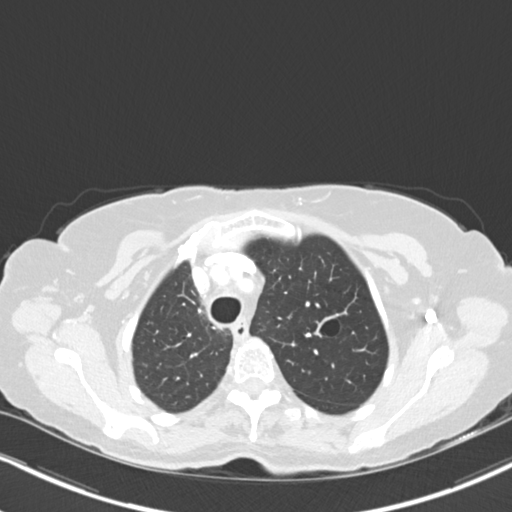
[im 124/146  lung]
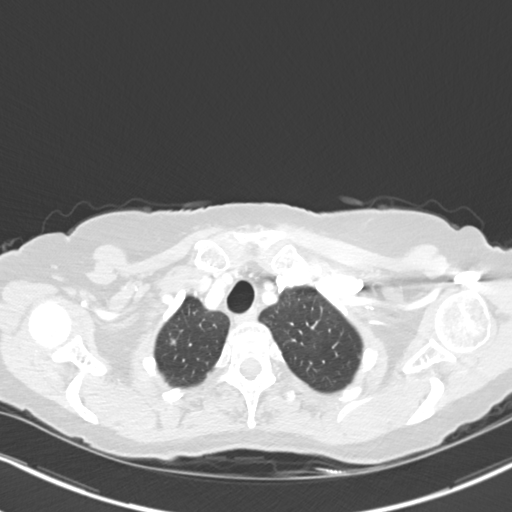
[im 135/146  lung]
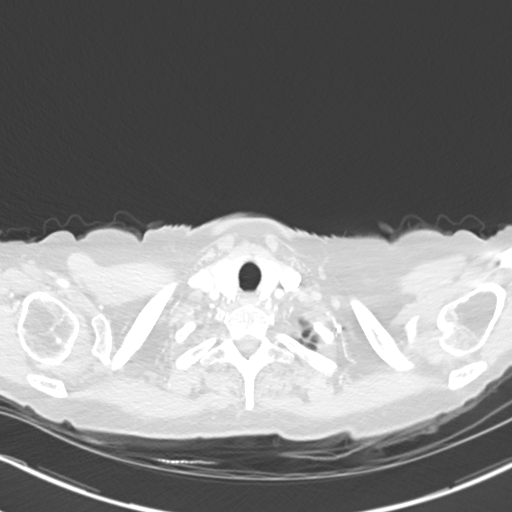

[Series 5: coronal · coronal · 0.59mm/px · 3 of 108 slices shown]
[im 22/108  lung]
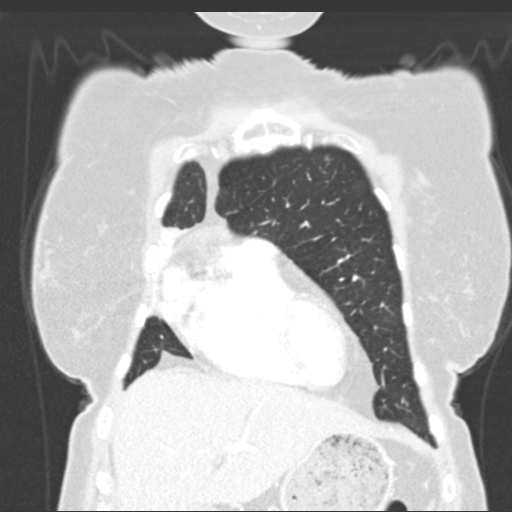
[im 43/108  lung]
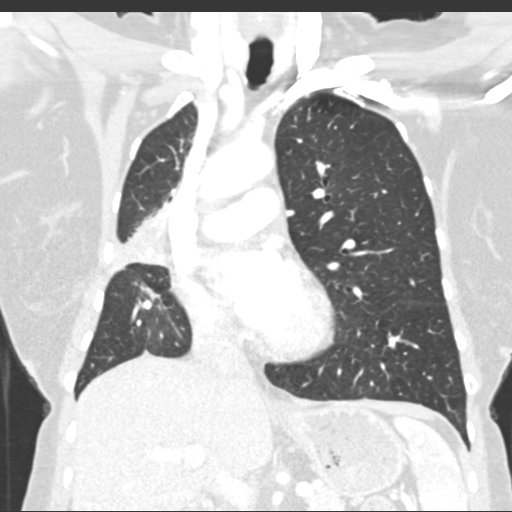
[im 65/108  lung]
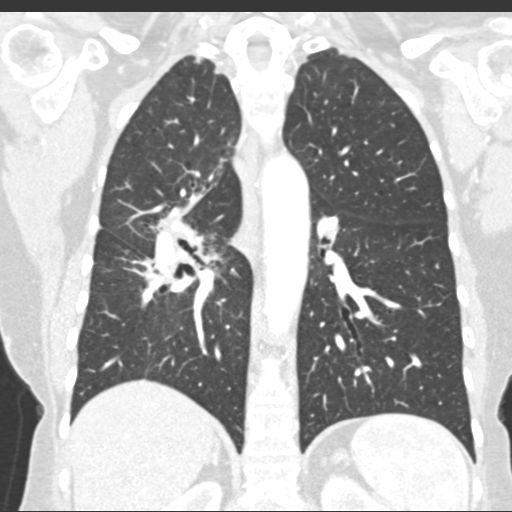

[15 of 36 positions shown; findings below may reference images not displayed]

FINDINGS: Cardiovascular: The heart size is normal. No pericardial effusion.
Atherosclerotic calcification is noted in the wall of the thoracic
aorta.

Mediastinum/Nodes: No mediastinal lymphadenopathy. No left hilar
lymphadenopathy. Abnormal soft tissue in the right hilum and right
parahilar lung is similar to prior and compatible with post
radiation change.

Lungs/Pleura: Intraluminal lesion is identified in the right
mainstem bronchus (image 62 series 2 which may be adherent mucus
given the very focal nature and well-defined margins. Stable volume
loss right hemithorax. Tiny right apical nodule (image 23) is
stable. Post radiation change noted right lung, as above. Left lung
clear.

Upper Abdomen: Unremarkable.

Musculoskeletal: Bone windows reveal no worrisome lytic or sclerotic
osseous lesions.
IMPRESSION: 1. Focal opacity identified along the anterior wall the right
mainstem bronchus, new in the interval. This may represent some
adherent mucus, but follow-up is recommended to ensure resolution
given the patient's history of persistent cough. Repeat CT in 2-4
weeks could be used to assess clearance.
2. Stable post treatment changes and volume loss right hemithorax.
3.  Aortic Atherosclerois (J76MA-170.0)

## 2019-07-07 ENCOUNTER — Other Ambulatory Visit: Payer: Self-pay

## 2019-07-07 ENCOUNTER — Encounter: Payer: Self-pay | Admitting: Family Medicine

## 2019-07-07 ENCOUNTER — Ambulatory Visit (INDEPENDENT_AMBULATORY_CARE_PROVIDER_SITE_OTHER): Payer: Medicare HMO

## 2019-07-07 ENCOUNTER — Ambulatory Visit (INDEPENDENT_AMBULATORY_CARE_PROVIDER_SITE_OTHER): Payer: Medicare HMO | Admitting: Family Medicine

## 2019-07-07 ENCOUNTER — Other Ambulatory Visit: Payer: Medicare HMO

## 2019-07-07 VITALS — BP 120/80 | HR 87 | Temp 99.5°F | Wt 150.0 lb

## 2019-07-07 DIAGNOSIS — C3491 Malignant neoplasm of unspecified part of right bronchus or lung: Secondary | ICD-10-CM | POA: Diagnosis not present

## 2019-07-07 DIAGNOSIS — R0602 Shortness of breath: Secondary | ICD-10-CM | POA: Diagnosis not present

## 2019-07-07 DIAGNOSIS — J471 Bronchiectasis with (acute) exacerbation: Secondary | ICD-10-CM

## 2019-07-07 DIAGNOSIS — R05 Cough: Secondary | ICD-10-CM | POA: Diagnosis not present

## 2019-07-07 DIAGNOSIS — Z20822 Contact with and (suspected) exposure to covid-19: Secondary | ICD-10-CM

## 2019-07-07 MED ORDER — LEVOFLOXACIN 500 MG PO TABS: 500.0000 mg | ORAL_TABLET | Freq: Every day | ORAL | 0 refills | Status: AC

## 2019-07-07 NOTE — Progress Notes (Signed)
   Subjective:    Patient ID: [name], female    DOB: 19-Oct-1946, 73 y.o.   MRN: [id number]  HPI Here for 3 weeks of a dry cough and SOB. No fever that she is aware of. No chest pain. No body aches or ST or headache or NVD. Using her inhaler and nebulizer.    Review of Systems  Constitutional: Negative.   HENT: Negative.   Eyes: Negative.   Respiratory: Positive for cough, chest tightness, shortness of breath and wheezing.   Cardiovascular: Negative.   Gastrointestinal: Negative.        Objective:   Physical Exam Constitutional:      General: She is not in acute distress.    Appearance: Normal appearance.  Cardiovascular:     Rate and Rhythm: Normal rate and regular rhythm.     Pulses: Normal pulses.     Heart sounds: Normal heart sounds.  Pulmonary:     Effort: Pulmonary effort is normal. No respiratory distress.     Breath sounds: No stridor. No rhonchi or rales.     Comments: Scattered wheezes throughout  Musculoskeletal:     Right lower leg: No edema.     Left lower leg: No edema.  Neurological:     Mental Status: She is alert.           Assessment & Plan:  This is a bronchitis, and we will treat with Levaquin. Get a CXR to rule out pneumonia. She will also get tested for the Covid-19 virus today.  Alysia Penna, MD

## 2019-07-08 LAB — NOVEL CORONAVIRUS, NAA: SARS-CoV-2, NAA: NOT DETECTED

## 2019-07-29 ENCOUNTER — Ambulatory Visit (INDEPENDENT_AMBULATORY_CARE_PROVIDER_SITE_OTHER): Payer: Medicare HMO | Admitting: Family Medicine

## 2019-07-29 ENCOUNTER — Encounter: Payer: Self-pay | Admitting: Family Medicine

## 2019-07-29 VITALS — BP 120/60 | HR 105 | Temp 96.7°F | Wt 147.2 lb

## 2019-07-29 DIAGNOSIS — Z23 Encounter for immunization: Secondary | ICD-10-CM

## 2019-07-29 DIAGNOSIS — D229 Melanocytic nevi, unspecified: Secondary | ICD-10-CM | POA: Diagnosis not present

## 2019-07-29 NOTE — Progress Notes (Signed)
   Subjective:    Patient ID: [name], female    DOB: 1946/03/30, 73 y.o.   MRN: [id number]  HPI Here to check a mole on the right leg that her daughter noticed recently. It does not bother her and she has no idea how long it has been present.    Review of Systems  Constitutional: Negative.   Respiratory: Negative.   Cardiovascular: Negative.        Objective:   Physical Exam Constitutional:      Appearance: Normal appearance.  Cardiovascular:     Rate and Rhythm: Normal rate and regular rhythm.     Pulses: Normal pulses.     Heart sounds: Normal heart sounds.  Pulmonary:     Effort: Pulmonary effort is normal.     Breath sounds: Normal breath sounds.  Skin:    Comments: There is a 1 cm raised lesion on the back of the right thigh that has a uniform dark brown coloration   Neurological:     Mental Status: She is alert.           Assessment & Plan:  This is a benign nevus. No treatment is needed but they will keep an eye on it.  Alysia Penna, MD

## 2019-08-10 ENCOUNTER — Other Ambulatory Visit: Payer: Self-pay | Admitting: Family Medicine

## 2019-10-13 ENCOUNTER — Encounter: Payer: Self-pay | Admitting: Gastroenterology

## 2019-10-26 LAB — HM MAMMOGRAPHY

## 2019-12-03 ENCOUNTER — Ambulatory Visit: Payer: Self-pay

## 2019-12-03 NOTE — Telephone Encounter (Signed)
Message Routed to PCP CMA 

## 2019-12-03 NOTE — Telephone Encounter (Signed)
Left message for patient to call back. CRM created. Patient will need to schedule an appointment to discuss this further with Dr. Sarajane Jews.

## 2019-12-03 NOTE — Telephone Encounter (Signed)
Pt. Reports she noticed right shoulder blade and neck pain 4-5 days ago. States when she has this pain, usually "it's my lung." History of lung cancer and pneumonia. Has some greenish-yellow mucus. Warm transfer to [facility] in the practice.  Answer Assessment - Initial Assessment Questions 1. ONSET: "When did the pain start?"     Started 4-5 days ago 2. LOCATION: "Where is the pain located?"     Right shoulder blade and neck 3. PAIN: "How bad is the pain?" (Scale 1-10; or mild, moderate, severe)   - MILD (1-3): doesn't interfere with normal activities   - MODERATE (4-7): interferes with normal activities (e.g., work or school) or awakens from sleep   - SEVERE (8-10): excruciating pain, unable to do any normal activities, unable to move arm at all due to pain     5 4. WORK OR EXERCISE: "Has there been any recent work or exercise that involved this part of the body?"     No 5. CAUSE: "What do you think is causing the shoulder pain?"     My lung 6. OTHER SYMPTOMS: "Do you have any other symptoms?" (e.g., neck pain, swelling, rash, fever, numbness, weakness)     Cough and mucus 7. PREGNANCY: "Is there any chance you are pregnant?" "When was your last menstrual period?"     n/a  Protocols used: SHOULDER PAIN-A-AH

## 2019-12-04 ENCOUNTER — Encounter: Payer: Self-pay | Admitting: Family Medicine

## 2019-12-04 NOTE — Telephone Encounter (Signed)
Left message for patient to call back  

## 2019-12-07 ENCOUNTER — Other Ambulatory Visit: Payer: Self-pay

## 2019-12-07 ENCOUNTER — Telehealth (INDEPENDENT_AMBULATORY_CARE_PROVIDER_SITE_OTHER): Payer: Medicare HMO | Admitting: Family Medicine

## 2019-12-07 DIAGNOSIS — M546 Pain in thoracic spine: Secondary | ICD-10-CM | POA: Diagnosis not present

## 2019-12-07 DIAGNOSIS — C3491 Malignant neoplasm of unspecified part of right bronchus or lung: Secondary | ICD-10-CM | POA: Diagnosis not present

## 2019-12-07 DIAGNOSIS — H9201 Otalgia, right ear: Secondary | ICD-10-CM

## 2019-12-07 DIAGNOSIS — K5900 Constipation, unspecified: Secondary | ICD-10-CM

## 2019-12-07 DIAGNOSIS — G8929 Other chronic pain: Secondary | ICD-10-CM

## 2019-12-07 NOTE — Progress Notes (Signed)
Virtual Visit via Telephone Note  I connected with the patient on 12/07/19 at 11:30 AM EST by telephone and verified that I am speaking with the correct person using two identifiers.   I discussed the limitations, risks, security and privacy concerns of performing an evaluation and management service by telephone and the availability of in person appointments. I also discussed with the patient that there may be a patient responsible charge related to this service. The patient expressed understanding and agreed to proceed.  Location patient: home Location provider: work or home office Participants present for the call: patient, provider Patient did not have a visit in the prior 7 days to address this/these issue(s).   History of Present Illness: Here for several issues. First for the past 4 weeks she has had a mild sharp intermittent pain in the right upper back beneath the shoulder blade. No recent trauma. No cough or SOB. I see her last chest CT was on 08-01-17, and she was to have had a follow up scan per Dr. Truman Hayward, her oncologist at [facility]. After we obtained this scan, she was to have followed up with Dr. Truman Hayward, but apparently no scan has been done since then. Also for the past week she has had a crackling noise in the right ear with decreased hearing and a mild discomfort in the ear. No sinus symptoms. Also she has had worsening constipation over the past year. She averages one stool a week. She has tried Marketing executive with no relief. She drinks lpnety of fluids. Her abdomen feels bloated but there is no pain.    Observations/Objective: Patient sounds cheerful and well on the phone. I do not appreciate any SOB. Speech and thought processing are grossly intact. Patient reported vitals:  Assessment and Plan: For the upper back pain, use Tylenol as needed. For the hx of lung cancer, we will set up another contrasted chest CT soon. The ear crackling may be due to a wax buildup, so she will try an  OTC ear wax removal kit. For the constipation she will try one tbsp of Milk of Magnesia TID.  Alysia Penna, MD   Follow Up Instructions:     [phone] 5-10 [phone] 11-20 [phone] 21-30 I did not refer this patient for an OV in the next 24 hours for this/these issue(s).  I discussed the assessment and treatment plan with the patient. The patient was provided an opportunity to ask questions and all were answered. The patient agreed with the plan and demonstrated an understanding of the instructions.   The patient was advised to call back or seek an in-person evaluation if the symptoms worsen or if the condition fails to improve as anticipated.  I provided 22 minutes of non-face-to-face time during this encounter.   Alysia Penna, MD

## 2019-12-23 ENCOUNTER — Ambulatory Visit: Payer: Medicare HMO

## 2019-12-31 ENCOUNTER — Ambulatory Visit: Payer: Medicare HMO

## 2019-12-31 ENCOUNTER — Ambulatory Visit: Payer: Medicare HMO | Attending: Internal Medicine

## 2019-12-31 DIAGNOSIS — Z23 Encounter for immunization: Secondary | ICD-10-CM | POA: Insufficient documentation

## 2019-12-31 NOTE — Progress Notes (Signed)
   Covid-19 Vaccination Clinic  Name:  [name]    MRN: [id number] DOB: 1946-11-21  12/31/2019  [name] was observed post Covid-19 immunization for 15 minutes without incidence. She was provided with Vaccine Information Sheet and instruction to access the V-Safe system.   [name] was instructed to call 911 with any severe reactions post vaccine: Marland Kitchen Difficulty breathing  . Swelling of your face and throat  . A fast heartbeat  . A bad rash all over your body  . Dizziness and weakness    Immunizations Administered    Name Date Dose VIS Date Route   Pfizer COVID-19 Vaccine 12/31/2019  4:50 PM 0.3 mL 11/06/2019 Intramuscular   Manufacturer: [place]   Lot: [id number]   [place]: [id number]

## 2020-01-25 ENCOUNTER — Ambulatory Visit: Payer: Medicare HMO | Attending: Internal Medicine

## 2020-01-25 DIAGNOSIS — Z23 Encounter for immunization: Secondary | ICD-10-CM

## 2020-01-25 NOTE — Progress Notes (Signed)
   Covid-19 Vaccination Clinic  Name:  [name]    MRN: [id number] DOB: 1946/02/14  01/25/2020  [name] was observed post Covid-19 immunization for 15 minutes without incidence. She was provided with Vaccine Information Sheet and instruction to access the V-Safe system.   [name] was instructed to call 911 with any severe reactions post vaccine: Marland Kitchen Difficulty breathing  . Swelling of your face and throat  . A fast heartbeat  . A bad rash all over your body  . Dizziness and weakness    Immunizations Administered    Name Date Dose VIS Date Route   Pfizer COVID-19 Vaccine 01/25/2020  5:16 PM 0.3 mL 11/06/2019 Intramuscular   Manufacturer: [place]   Lot: [id number]   [place]: [id number]

## 2020-02-14 ENCOUNTER — Other Ambulatory Visit: Payer: Self-pay | Admitting: Family Medicine

## 2020-03-16 ENCOUNTER — Other Ambulatory Visit: Payer: Self-pay | Admitting: Family Medicine

## 2020-03-17 DIAGNOSIS — E7800 Pure hypercholesterolemia, unspecified: Secondary | ICD-10-CM | POA: Diagnosis not present

## 2020-03-17 DIAGNOSIS — Z86718 Personal history of other venous thrombosis and embolism: Secondary | ICD-10-CM | POA: Diagnosis not present

## 2020-03-17 DIAGNOSIS — R911 Solitary pulmonary nodule: Secondary | ICD-10-CM | POA: Diagnosis not present

## 2020-03-17 DIAGNOSIS — Z7901 Long term (current) use of anticoagulants: Secondary | ICD-10-CM | POA: Diagnosis not present

## 2020-03-17 DIAGNOSIS — H9311 Tinnitus, right ear: Secondary | ICD-10-CM | POA: Diagnosis not present

## 2020-03-17 DIAGNOSIS — C3490 Malignant neoplasm of unspecified part of unspecified bronchus or lung: Secondary | ICD-10-CM | POA: Diagnosis not present

## 2020-03-17 DIAGNOSIS — R918 Other nonspecific abnormal finding of lung field: Secondary | ICD-10-CM | POA: Diagnosis not present

## 2020-03-17 DIAGNOSIS — R600 Localized edema: Secondary | ICD-10-CM | POA: Diagnosis not present

## 2020-03-17 DIAGNOSIS — Z86711 Personal history of pulmonary embolism: Secondary | ICD-10-CM | POA: Diagnosis not present

## 2020-03-17 DIAGNOSIS — Z87891 Personal history of nicotine dependence: Secondary | ICD-10-CM | POA: Diagnosis not present

## 2020-03-17 DIAGNOSIS — Z79899 Other long term (current) drug therapy: Secondary | ICD-10-CM | POA: Diagnosis not present

## 2020-04-12 DIAGNOSIS — J471 Bronchiectasis with (acute) exacerbation: Secondary | ICD-10-CM | POA: Diagnosis not present

## 2020-04-12 DIAGNOSIS — J479 Bronchiectasis, uncomplicated: Secondary | ICD-10-CM | POA: Diagnosis not present

## 2020-04-12 DIAGNOSIS — R062 Wheezing: Secondary | ICD-10-CM | POA: Diagnosis not present

## 2020-04-12 DIAGNOSIS — F17210 Nicotine dependence, cigarettes, uncomplicated: Secondary | ICD-10-CM | POA: Diagnosis not present

## 2020-04-12 DIAGNOSIS — Z86718 Personal history of other venous thrombosis and embolism: Secondary | ICD-10-CM | POA: Diagnosis not present

## 2020-04-12 DIAGNOSIS — R079 Chest pain, unspecified: Secondary | ICD-10-CM | POA: Diagnosis not present

## 2020-04-12 DIAGNOSIS — Z885 Allergy status to narcotic agent status: Secondary | ICD-10-CM | POA: Diagnosis not present

## 2020-04-12 DIAGNOSIS — R1310 Dysphagia, unspecified: Secondary | ICD-10-CM | POA: Diagnosis not present

## 2020-04-16 ENCOUNTER — Other Ambulatory Visit: Payer: Self-pay | Admitting: Family Medicine

## 2020-04-20 DIAGNOSIS — Z1231 Encounter for screening mammogram for malignant neoplasm of breast: Secondary | ICD-10-CM | POA: Diagnosis not present

## 2020-04-20 LAB — HM MAMMOGRAPHY

## 2020-04-22 ENCOUNTER — Encounter: Payer: Self-pay | Admitting: Family Medicine

## 2020-05-10 DIAGNOSIS — Z79899 Other long term (current) drug therapy: Secondary | ICD-10-CM | POA: Diagnosis not present

## 2020-05-10 DIAGNOSIS — J479 Bronchiectasis, uncomplicated: Secondary | ICD-10-CM | POA: Diagnosis not present

## 2020-05-10 DIAGNOSIS — R1310 Dysphagia, unspecified: Secondary | ICD-10-CM | POA: Diagnosis not present

## 2020-05-10 DIAGNOSIS — Z86711 Personal history of pulmonary embolism: Secondary | ICD-10-CM | POA: Diagnosis not present

## 2020-05-10 DIAGNOSIS — Z87891 Personal history of nicotine dependence: Secondary | ICD-10-CM | POA: Diagnosis not present

## 2020-05-10 DIAGNOSIS — R05 Cough: Secondary | ICD-10-CM | POA: Diagnosis not present

## 2020-05-11 ENCOUNTER — Telehealth: Payer: Self-pay | Admitting: Family Medicine

## 2020-05-11 NOTE — Telephone Encounter (Signed)
Spoke with patient she stated she has so many test going on and she want to wait for results before scheduling wellness visit

## 2020-05-17 DIAGNOSIS — H9201 Otalgia, right ear: Secondary | ICD-10-CM | POA: Diagnosis not present

## 2020-05-17 DIAGNOSIS — Z0110 Encounter for examination of ears and hearing without abnormal findings: Secondary | ICD-10-CM | POA: Diagnosis not present

## 2020-05-17 DIAGNOSIS — H9313 Tinnitus, bilateral: Secondary | ICD-10-CM | POA: Diagnosis not present

## 2020-05-18 ENCOUNTER — Other Ambulatory Visit: Payer: Self-pay | Admitting: Family Medicine

## 2020-05-24 ENCOUNTER — Encounter: Payer: Self-pay | Admitting: Family Medicine

## 2020-05-24 ENCOUNTER — Ambulatory Visit (INDEPENDENT_AMBULATORY_CARE_PROVIDER_SITE_OTHER): Payer: Medicare HMO | Admitting: Family Medicine

## 2020-05-24 ENCOUNTER — Other Ambulatory Visit: Payer: Self-pay

## 2020-05-24 VITALS — BP 120/62 | HR 92 | Temp 97.9°F | Wt 148.2 lb

## 2020-05-24 DIAGNOSIS — R053 Chronic cough: Secondary | ICD-10-CM

## 2020-05-24 DIAGNOSIS — R05 Cough: Secondary | ICD-10-CM | POA: Diagnosis not present

## 2020-05-24 DIAGNOSIS — M25562 Pain in left knee: Secondary | ICD-10-CM

## 2020-05-24 DIAGNOSIS — K5900 Constipation, unspecified: Secondary | ICD-10-CM

## 2020-05-24 DIAGNOSIS — C3491 Malignant neoplasm of unspecified part of right bronchus or lung: Secondary | ICD-10-CM | POA: Diagnosis not present

## 2020-05-24 MED ORDER — LINACLOTIDE 72 MCG PO CAPS: 72.0000 ug | ORAL_CAPSULE | Freq: Every day | ORAL | 2 refills | Status: DC

## 2020-05-24 MED ORDER — TRAMADOL HCL 50 MG PO TABS: 100.0000 mg | ORAL_TABLET | Freq: Every day | ORAL | 0 refills | Status: DC

## 2020-05-24 NOTE — Progress Notes (Signed)
° °  Subjective:    Patient ID: [name], female    DOB: 09/30/1946, 74 y.o.   MRN: [id number]  HPI Here for several issues. First she asks me to check her lungs. She has a dry occasional cough and she wants to make sure there is no infection. No fevers. She had a chest CT at [facility] in April which showed a new area of small opacities in the posterior RLL that were felt to represent evolving areas of scarring from her radiation treatments. She is scheduled for a 3 month follow up CT on 06-22-20. Also she complains of 2 weeks of pain and stiffness in the left  knee. No recent trauma. It hurts to walk or go up steps. It does not swell. Tylenol does not help. Third she struggles with constipation and she has tried stool softeners, milk of magnesia, Miralax, etc with little improvement. One of her lung doctors suggested she ask [place] about Linzess.    Review of Systems  Constitutional: Negative.   Respiratory: Positive for cough and wheezing. Negative for shortness of breath.   Cardiovascular: Negative.   Gastrointestinal: Positive for constipation. Negative for abdominal distention, abdominal pain, anal bleeding, blood in stool, diarrhea, nausea, rectal pain and vomiting.  Musculoskeletal: Positive for arthralgias.       Objective:   Physical Exam Constitutional:      Appearance: Normal appearance. She is not ill-appearing.  Cardiovascular:     Rate and Rhythm: Normal rate and regular rhythm.     Pulses: Normal pulses.     Heart sounds: Normal heart sounds.  Pulmonary:     Effort: Pulmonary effort is normal. No respiratory distress.     Breath sounds: Wheezing present. No rhonchi or rales.  Abdominal:     General: Abdomen is flat. Bowel sounds are normal. There is no distension.     Palpations: Abdomen is soft. There is no mass.     Tenderness: There is no abdominal tenderness. There is no guarding or rebound.     Hernia: No hernia is present.  Musculoskeletal:     Comments: Left knee  is not swollen or warm or red. She is tender in both joint spaces. Crepitus is present. ROM is fairly limited by pain.   Neurological:     Mental Status: She is alert.           Assessment & Plan:  Her cough is benign, and I reassured her she does not seem to have any sort of infection now. She will get the chest CT as above. The knee pain seems to be from osteoarthritis, and she will try Tramadol for pain, especially at bedtime. For the constipation, try Linzess 72 mg daily.  Alysia Penna, MD

## 2020-06-19 ENCOUNTER — Other Ambulatory Visit: Payer: Self-pay | Admitting: Family Medicine

## 2020-06-22 DIAGNOSIS — C3490 Malignant neoplasm of unspecified part of unspecified bronchus or lung: Secondary | ICD-10-CM | POA: Diagnosis not present

## 2020-06-22 DIAGNOSIS — R911 Solitary pulmonary nodule: Secondary | ICD-10-CM | POA: Diagnosis not present

## 2020-06-22 DIAGNOSIS — Z923 Personal history of irradiation: Secondary | ICD-10-CM | POA: Diagnosis not present

## 2020-06-22 DIAGNOSIS — R590 Localized enlarged lymph nodes: Secondary | ICD-10-CM | POA: Diagnosis not present

## 2020-07-21 ENCOUNTER — Telehealth: Payer: Self-pay | Admitting: Family Medicine

## 2020-07-21 NOTE — Progress Notes (Signed)
°  Chronic Care Management   Note  07/21/2020 Name: [name] MRN: [id number] DOB: 09-05-1946  [name] is a 74 y.o. year old female who is a primary care patient of Laurey Morale, MD. I reached out to Marge Duncans by phone today in response to a referral sent by Ms. Magda Bernheim PCP, Laurey Morale, MD.   Ms. Cara was given information about Chronic Care Management services today including:  1. CCM service includes personalized support from designated clinical staff supervised by her physician, including individualized plan of care and coordination with other care providers 2. 24/7 contact phone numbers for assistance for urgent and routine care needs. 3. Service will only be billed when office clinical staff spend 20 minutes or more in a month to coordinate care. 4. Only one practitioner may furnish and bill the service in a calendar month. 5. The patient may stop CCM services at any time (effective at the end of the month) by phone call to the office staff.   Patient wishes to consider information provided and/or speak with a member of the care team before deciding about enrollment in care management services.   Follow up plan:   Carley Perdue [employer] Scheduler

## 2020-07-25 ENCOUNTER — Other Ambulatory Visit: Payer: Self-pay

## 2020-07-25 ENCOUNTER — Encounter: Payer: Self-pay | Admitting: Family Medicine

## 2020-07-25 ENCOUNTER — Ambulatory Visit (INDEPENDENT_AMBULATORY_CARE_PROVIDER_SITE_OTHER): Payer: Medicare HMO | Admitting: Family Medicine

## 2020-07-25 VITALS — BP 118/60 | HR 78 | Wt 147.2 lb

## 2020-07-25 DIAGNOSIS — N390 Urinary tract infection, site not specified: Secondary | ICD-10-CM

## 2020-07-25 DIAGNOSIS — R350 Frequency of micturition: Secondary | ICD-10-CM | POA: Diagnosis not present

## 2020-07-25 LAB — POCT URINALYSIS DIPSTICK
Bilirubin, UA: NEGATIVE
Blood, UA: POSITIVE
Glucose, UA: NEGATIVE
Ketones, UA: NEGATIVE
Nitrite, UA: POSITIVE
Protein, UA: NEGATIVE
Spec Grav, UA: 1.015 (ref 1.010–1.025)
Urobilinogen, UA: 0.2 E.U./dL
pH, UA: 6 (ref 5.0–8.0)

## 2020-07-25 MED ORDER — NITROFURANTOIN MONOHYD MACRO 100 MG PO CAPS
100.0000 mg | ORAL_CAPSULE | Freq: Two times a day (BID) | ORAL | 0 refills | Status: DC
Start: 2020-07-25 — End: 2020-09-06

## 2020-07-25 NOTE — Progress Notes (Signed)
   Subjective:    Patient ID: [name], female    DOB: [birth year], 74 y.o.   MRN: [id number]  HPI Here for 3 days of urinary urgency and burning. The urine has a foul odor. No fever. Drinking water.    Review of Systems  Constitutional: Negative.   Respiratory: Negative.   Cardiovascular: Negative.   Gastrointestinal: Negative.   Genitourinary: Positive for dysuria, frequency and urgency. Negative for flank pain and hematuria.       Objective:   Physical Exam Constitutional:      Appearance: Normal appearance. She is not ill-appearing.  Cardiovascular:     Rate and Rhythm: Normal rate and regular rhythm.     Pulses: Normal pulses.     Heart sounds: Normal heart sounds.  Pulmonary:     Effort: Pulmonary effort is normal.     Breath sounds: Normal breath sounds.  Abdominal:     General: Abdomen is flat. Bowel sounds are normal. There is no distension.     Palpations: Abdomen is soft. There is no mass.     Tenderness: There is no abdominal tenderness. There is no right CVA tenderness, left CVA tenderness, guarding or rebound.     Hernia: No hernia is present.  Neurological:     Mental Status: She is alert.           Assessment & Plan:  UTI, treat with Macrobid. Culture the sample.  Alysia Penna, MD

## 2020-07-28 ENCOUNTER — Other Ambulatory Visit: Payer: Self-pay

## 2020-07-28 LAB — URINE CULTURE
MICRO NUMBER:: 10888446
SPECIMEN QUALITY:: ADEQUATE

## 2020-07-28 MED ORDER — SULFAMETHOXAZOLE-TRIMETHOPRIM 800-160 MG PO TABS
1.0000 | ORAL_TABLET | Freq: Two times a day (BID) | ORAL | 0 refills | Status: DC
Start: 2020-07-28 — End: 2020-09-06

## 2020-08-10 ENCOUNTER — Telehealth: Payer: Self-pay | Admitting: Family Medicine

## 2020-08-10 NOTE — Progress Notes (Signed)
  Chronic Care Management   Outreach Note  08/10/2020 Name: [name] MRN: [id number] DOB: 13-Feb-1946  Referred by: Laurey Morale, MD Reason for referral : No chief complaint on file.   A second unsuccessful telephone outreach was attempted today. The patient was referred to pharmacist for assistance with care management and care coordination.  Follow Up Plan:   Carley Perdue [employer] Scheduler

## 2020-08-11 ENCOUNTER — Telehealth: Payer: Self-pay | Admitting: Family Medicine

## 2020-08-11 NOTE — Telephone Encounter (Signed)
Spoke with patient she request me to call back in October

## 2020-08-16 ENCOUNTER — Telehealth: Payer: Self-pay | Admitting: Family Medicine

## 2020-08-16 NOTE — Progress Notes (Signed)
  Chronic Care Management   Outreach Note  08/16/2020 Name: [name] MRN: [id number] DOB: 1946-07-01  Referred by: Laurey Morale, MD Reason for referral : No chief complaint on file.   Third unsuccessful telephone outreach was attempted today. The patient was referred to the pharmacist for assistance with care management and care coordination.   Follow Up Plan:   Carley Perdue [employer] Scheduler

## 2020-09-01 ENCOUNTER — Other Ambulatory Visit: Payer: Self-pay

## 2020-09-02 ENCOUNTER — Ambulatory Visit (INDEPENDENT_AMBULATORY_CARE_PROVIDER_SITE_OTHER): Payer: Medicare HMO | Admitting: Family Medicine

## 2020-09-02 ENCOUNTER — Encounter: Payer: Self-pay | Admitting: Family Medicine

## 2020-09-02 VITALS — BP 128/84 | HR 68 | Temp 98.2°F | Ht 65.5 in | Wt 148.0 lb

## 2020-09-02 DIAGNOSIS — E559 Vitamin D deficiency, unspecified: Secondary | ICD-10-CM

## 2020-09-02 DIAGNOSIS — E538 Deficiency of other specified B group vitamins: Secondary | ICD-10-CM

## 2020-09-02 DIAGNOSIS — D649 Anemia, unspecified: Secondary | ICD-10-CM | POA: Diagnosis not present

## 2020-09-02 DIAGNOSIS — Z23 Encounter for immunization: Secondary | ICD-10-CM | POA: Diagnosis not present

## 2020-09-02 DIAGNOSIS — I10 Essential (primary) hypertension: Secondary | ICD-10-CM

## 2020-09-02 DIAGNOSIS — R739 Hyperglycemia, unspecified: Secondary | ICD-10-CM | POA: Diagnosis not present

## 2020-09-02 DIAGNOSIS — L989 Disorder of the skin and subcutaneous tissue, unspecified: Secondary | ICD-10-CM

## 2020-09-02 DIAGNOSIS — Z0000 Encounter for general adult medical examination without abnormal findings: Secondary | ICD-10-CM

## 2020-09-02 NOTE — Addendum Note (Signed)
Addended by: Marrion Coy on: 09/02/2020 03:01 PM   Modules accepted: Orders

## 2020-09-02 NOTE — Addendum Note (Signed)
Addended by: Marrion Coy on: 09/02/2020 03:02 PM   Modules accepted: Orders

## 2020-09-02 NOTE — Addendum Note (Signed)
Addended by: Matilde Sprang on: 09/02/2020 05:29 PM   Modules accepted: Orders

## 2020-09-02 NOTE — Progress Notes (Signed)
   Subjective:    Patient ID: [name], female    DOB: [birth year], 74 y.o.   MRN: [id number]  HPI Here for several issues. First she complains of generalized fatigue for the past 6 weeks. No fever or weight changes. She wants to see a Dermatologist about a lesion on the back of the right thigh. She is past due for a colonoscopy.    Review of Systems  Constitutional: Positive for fatigue. Negative for chills, diaphoresis and unexpected weight change.  Respiratory: Negative.   Cardiovascular: Negative.   Gastrointestinal: Positive for constipation. Negative for abdominal distention, abdominal pain and nausea.  Endocrine: Negative.   Genitourinary: Negative.        Objective:   Physical Exam Constitutional:      Appearance: Normal appearance.  Cardiovascular:     Rate and Rhythm: Normal rate and regular rhythm.     Pulses: Normal pulses.     Heart sounds: Normal heart sounds.  Pulmonary:     Effort: Pulmonary effort is normal.     Breath sounds: Normal breath sounds.  Abdominal:     General: Abdomen is flat. Bowel sounds are normal. There is no distension.     Palpations: Abdomen is soft. There is no mass.     Tenderness: There is no abdominal tenderness. There is no guarding or rebound.     Hernia: No hernia is present.  Neurological:     Mental Status: She is alert.           Assessment & Plan:  Fatigue of uncertain etiology. She has known hx of anemia and B12 deficiency. We will screen with labs today. Refer for a colonoscopy. Refer to Dermatology. Alysia Penna, MD

## 2020-09-03 LAB — BASIC METABOLIC PANEL
BUN: 18 mg/dL (ref 7–25)
CO2: 29 mmol/L (ref 20–32)
Calcium: 10.4 mg/dL (ref 8.6–10.4)
Chloride: 105 mmol/L (ref 98–110)
Creat: 0.81 mg/dL (ref 0.60–0.93)
Glucose, Bld: 95 mg/dL (ref 65–99)
Potassium: 4.9 mmol/L (ref 3.5–5.3)
Sodium: 142 mmol/L (ref 135–146)

## 2020-09-03 LAB — HEPATIC FUNCTION PANEL
AG Ratio: 1.8 (calc) (ref 1.0–2.5)
ALT: 9 U/L (ref 6–29)
AST: 13 U/L (ref 10–35)
Albumin: 4.4 g/dL (ref 3.6–5.1)
Alkaline phosphatase (APISO): 53 U/L (ref 37–153)
Bilirubin, Direct: 0.1 mg/dL (ref 0.0–0.2)
Globulin: 2.4 g/dL (calc) (ref 1.9–3.7)
Indirect Bilirubin: 0.4 mg/dL (calc) (ref 0.2–1.2)
Total Bilirubin: 0.5 mg/dL (ref 0.2–1.2)
Total Protein: 6.8 g/dL (ref 6.1–8.1)

## 2020-09-03 LAB — CBC WITH DIFFERENTIAL/PLATELET
Absolute Monocytes: 587 cells/uL (ref 200–950)
Basophils Absolute: 41 cells/uL (ref 0–200)
Basophils Relative: 0.6 %
Eosinophils Absolute: 90 cells/uL (ref 15–500)
Eosinophils Relative: 1.3 %
HCT: 43.4 % (ref 35.0–45.0)
Hemoglobin: 14.6 g/dL (ref 11.7–15.5)
Lymphs Abs: 1428 cells/uL (ref 850–3900)
MCH: 29.9 pg (ref 27.0–33.0)
MCHC: 33.6 g/dL (ref 32.0–36.0)
MCV: 88.9 fL (ref 80.0–100.0)
MPV: 10.6 fL (ref 7.5–12.5)
Monocytes Relative: 8.5 %
Neutro Abs: 4754 cells/uL (ref 1500–7800)
Neutrophils Relative %: 68.9 %
Platelets: 247 10*3/uL (ref 140–400)
RBC: 4.88 10*6/uL (ref 3.80–5.10)
RDW: 13 % (ref 11.0–15.0)
Total Lymphocyte: 20.7 %
WBC: 6.9 10*3/uL (ref 3.8–10.8)

## 2020-09-03 LAB — TSH: TSH: 1.2 mIU/L (ref 0.40–4.50)

## 2020-09-03 LAB — HEMOGLOBIN A1C
Hgb A1c MFr Bld: 5.1 % of total Hgb (ref <5.7)
Mean Plasma Glucose: 100 (calc)
eAG (mmol/L): 5.5 (calc)

## 2020-09-03 LAB — VITAMIN B12: Vitamin B-12: 327 pg/mL (ref 200–1100)

## 2020-09-03 LAB — VITAMIN D 25 HYDROXY (VIT D DEFICIENCY, FRACTURES): Vit D, 25-Hydroxy: 23 ng/mL — ABNORMAL LOW (ref 30–100)

## 2020-09-06 ENCOUNTER — Telehealth: Payer: Self-pay | Admitting: Family Medicine

## 2020-09-06 ENCOUNTER — Telehealth: Payer: Self-pay

## 2020-09-06 DIAGNOSIS — E559 Vitamin D deficiency, unspecified: Secondary | ICD-10-CM

## 2020-09-06 MED ORDER — VITAMIN D (ERGOCALCIFEROL) 1.25 MG (50000 UNIT) PO CAPS: 50000.0000 [IU] | ORAL_CAPSULE | ORAL | 3 refills | Status: DC

## 2020-09-06 NOTE — Telephone Encounter (Signed)
Spoke with patient she said she will call back to schedule AWV

## 2020-09-06 NOTE — Telephone Encounter (Signed)
Vitamin D weekly supplement sent to [facility] on [address] per pt request and PCP written instructions in the labs tab. 12 capsules with 3 refills.   Pt stated understanding.   Also reviewed pt medications and updated per pt statement.

## 2020-09-13 ENCOUNTER — Telehealth: Payer: Self-pay | Admitting: Family Medicine

## 2020-09-13 NOTE — Telephone Encounter (Signed)
Please advise on this.  

## 2020-09-13 NOTE — Telephone Encounter (Signed)
Pt is calling in stating that she is not able to get an appointment for a GI doctor until December and for a Colonoscopy until January and they do not have their schedule open.  Pt would like to know if there is another GI provider that Dr. Sarajane Jews can refer her to due to the other one is a long wait.  Pt would like to have a call back.

## 2020-09-14 NOTE — Telephone Encounter (Signed)
I think she should keep this appt. There is no way for her to see another GI any sooner (she can get on a cancellation list)

## 2020-09-14 NOTE — Telephone Encounter (Signed)
Patient called and given the recommendation from Dr. Sarajane Jews as noted below. She says she will call and schedule. She also says she hasn't heard anything about the Dermatology referral. I advised it is done and I will check on the status and update her. Patient called and given the information below after speaking with Neoma Laming, Referral Coordinator. Patient says she wanted to see a female dermatologist. I advised to call the office to see if they have a female dermatologist and if not call us back at the office to schedule another referral.   Referral place on [address] - The office has tried to call her to schedule the pt has to call their office to schedule [name] Dermatologist [address] [place], [place] [address] [phone] "Rejection Reason - Patient did not respond - called several times no response"  [facility] and [place] said on Jul 07, 2020 12:55 PM

## 2020-11-02 DIAGNOSIS — L821 Other seborrheic keratosis: Secondary | ICD-10-CM | POA: Diagnosis not present

## 2020-11-02 DIAGNOSIS — D485 Neoplasm of uncertain behavior of skin: Secondary | ICD-10-CM | POA: Diagnosis not present

## 2020-11-07 DIAGNOSIS — Z79899 Other long term (current) drug therapy: Secondary | ICD-10-CM | POA: Diagnosis not present

## 2020-11-07 DIAGNOSIS — L080 Pyoderma: Secondary | ICD-10-CM | POA: Diagnosis not present

## 2020-11-23 ENCOUNTER — Telehealth (INDEPENDENT_AMBULATORY_CARE_PROVIDER_SITE_OTHER): Payer: Medicare HMO | Admitting: Family Medicine

## 2020-11-23 DIAGNOSIS — H109 Unspecified conjunctivitis: Secondary | ICD-10-CM | POA: Diagnosis not present

## 2020-11-23 MED ORDER — POLYMYXIN B-TRIMETHOPRIM 10000-0.1 UNIT/ML-% OP SOLN: OPHTHALMIC | 0 refills | Status: DC

## 2020-11-23 NOTE — Progress Notes (Signed)
Patient ID: [name], female   DOB: [birth year], 74 y.o.   MRN: [id number]   This visit type was conducted due to national recommendations for restrictions regarding the COVID-19 pandemic in an effort to limit this patient's exposure and mitigate transmission in our community.   Virtual Visit via Telephone Note  I connected with [name] on 11/23/20 at  5:15 PM EST by telephone and verified that I am speaking with the correct person using two identifiers.   I discussed the limitations, risks, security and privacy concerns of performing an evaluation and management service by telephone and the availability of in person appointments. I also discussed with the patient that there may be a patient responsible charge related to this service. The patient expressed understanding and agreed to proceed.  Location patient: home Location provider: work or home office Participants present for the call: patient, provider Patient did not have a visit in the prior 7 days to address this/these issue(s).   History of Present Illness: [name] called with left eye symptoms of 1 day duration of some itching and matting.  She has had some thick colored discharge from the left eye.  Denies any eye injury.  No blurred vision.  She is used some warm compresses.  No contact use.  No eye pain.  History of allergy to Cipro.  No recent sick contacts  Past Medical History:  Diagnosis Date  . Anemia   . Cancer ([place])   . Cataract   . Clotting disorder ([place])   . Community acquired pneumonia 01/22/2013  . DVT (deep venous thrombosis) ([facility])    had 2 in right calf & 1 in the left calf  . Fibromyalgia   . Hyperlipidemia   . Hypertension   . Peripheral vascular disease ([place])   . PMR (polymyalgia rheumatica) ([place]) 10/31/2017  . PONV (postoperative nausea and vomiting)   . Squamous cell lung cancer [facility]) 2013   sees Dr. Elmon Kirschner at [facility] and Dr. Jaye Beagle at [facility]   . Tobacco  abuse    Past Surgical History:  Procedure Laterality Date  . APPENDECTOMY    . CHOLECYSTECTOMY    . COLONOSCOPY     11/28/09 repeat in 10 years  . ESOPHAGOGASTRODUODENOSCOPY  October 2013   severe esophagitis, done at [facility]   . KNEE ARTHROSCOPY     right  . LAMINECTOMY     lumbar  . PORTACATH PLACEMENT    . TONSILLECTOMY    . VIDEO BRONCHOSCOPY  05/30/2012   Procedure: VIDEO BRONCHOSCOPY WITH FLUORO;  Surgeon: Elsie Stain, MD;  Location: [facility];  Service: Cardiopulmonary;  Laterality: N/A;    reports that she quit smoking about 8 years ago. She has never used smokeless tobacco. She reports current alcohol use. She reports that she does not use drugs. family history includes Anemia in her unknown relative; Arthritis in her unknown relative; Atrial fibrillation in her mother; Cancer in her father, mother, and unknown relative; Hypertension in her mother; Multiple sclerosis in her sister. Allergies  Allergen Reactions  . Ciprofloxacin Other (See Comments)    Diffuse polyarthropathy, joint effusions, pain across UE and LE with Cipro 750 mg. Fluoroquinolone class avoidance recommended.   . Promethazine Other (See Comments)    Patient states was during a hospitalization was taken off but unsure of the reason  . Prednisone     Did NOT tolerate well  . Zofran [Ondansetron Hcl]     Patient states she  should not take Zofran due to her heart.  . Morphine And Related Other (See Comments)    Hard time waking up when taking morphine, codeine, and related medications      Observations/Objective: Patient sounds cheerful and well on the phone. I do not appreciate any SOB. Speech and thought processing are grossly intact. Patient reported vitals:  Assessment and Plan:  Probable bacterial conjunctivitis left eye  -Recommend warm compresses several times daily -Polytrim ophthalmic drops 2 drops left eye every 4 hours while awake -Follow-up with primary in office for further  assessment if not clearing by next week.  Follow Up Instructions:  -As above   99441 5-10 [id number] 11-20 [id number] 21-30 I did not refer this patient for an OV in the next 24 hours for this/these issue(s).  I discussed the assessment and treatment plan with the patient. The patient was provided an opportunity to ask questions and all were answered. The patient agreed with the plan and demonstrated an understanding of the instructions.   The patient was advised to call back or seek an in-person evaluation if the symptoms worsen or if the condition fails to improve as anticipated.  I provided 12 minutes of non-face-to-face time during this encounter.   Lydia Littler, MD

## 2020-11-29 ENCOUNTER — Other Ambulatory Visit: Payer: Self-pay

## 2020-11-29 ENCOUNTER — Encounter: Payer: Self-pay | Admitting: Family Medicine

## 2020-11-29 ENCOUNTER — Ambulatory Visit (INDEPENDENT_AMBULATORY_CARE_PROVIDER_SITE_OTHER): Payer: Medicare HMO | Admitting: Family Medicine

## 2020-11-29 VITALS — BP 112/80 | HR 100 | Temp 98.2°F | Ht 65.5 in | Wt 147.2 lb

## 2020-11-29 DIAGNOSIS — H109 Unspecified conjunctivitis: Secondary | ICD-10-CM

## 2020-11-29 MED ORDER — TOBRAMYCIN-DEXAMETHASONE 0.3-0.1 % OP SUSP: 2.0000 [drp] | OPHTHALMIC | 0 refills | Status: DC

## 2020-11-29 NOTE — Progress Notes (Signed)
   Subjective:    Patient ID: [name], female    DOB: 15-Mar-1946, 75 y.o.   MRN: [id number]  HPI Here for irritation and redness in both eyes. This started about 2 weeks ago. She has also had crusting and matting of the eyes. No sinus congestion or ST or cough or fever. She spoke to Dr. Elease Hashimoto on 11-23-20 and he gave her Trimethaprim/polymixyn B drops. These cause her eyes to feel worse however and they burn when she puts them in.    Review of Systems  Constitutional: Negative.   HENT: Negative.   Eyes: Positive for discharge and redness. Negative for photophobia and visual disturbance.       Objective:   Physical Exam Constitutional:      Appearance: Normal appearance.  HENT:     Right Ear: Tympanic membrane, ear canal and external ear normal.     Left Ear: Tympanic membrane, ear canal and external ear normal.     Nose: Nose normal.     Mouth/Throat:     Pharynx: Oropharynx is clear.  Eyes:     Comments: Both conjunctivae are pink. No photophobia   Pulmonary:     Effort: Pulmonary effort is normal.     Breath sounds: Normal breath sounds.  Lymphadenopathy:     Cervical: No cervical adenopathy.  Neurological:     Mental Status: She is alert.           Assessment & Plan:  Conjunctivitis, stop the current drops and try Tobradex drops. Recheck prn.  Alysia Penna, MD

## 2020-12-06 ENCOUNTER — Ambulatory Visit: Payer: Medicare HMO | Admitting: Gastroenterology

## 2020-12-21 DIAGNOSIS — C3490 Malignant neoplasm of unspecified part of unspecified bronchus or lung: Secondary | ICD-10-CM | POA: Diagnosis not present

## 2020-12-21 DIAGNOSIS — R531 Weakness: Secondary | ICD-10-CM | POA: Diagnosis not present

## 2020-12-21 DIAGNOSIS — R911 Solitary pulmonary nodule: Secondary | ICD-10-CM | POA: Diagnosis not present

## 2021-03-13 ENCOUNTER — Ambulatory Visit (INDEPENDENT_AMBULATORY_CARE_PROVIDER_SITE_OTHER): Payer: Medicare HMO | Admitting: Family Medicine

## 2021-03-13 ENCOUNTER — Encounter: Payer: Self-pay | Admitting: Family Medicine

## 2021-03-13 ENCOUNTER — Other Ambulatory Visit: Payer: Self-pay

## 2021-03-13 VITALS — BP 110/78 | HR 99 | Temp 97.6°F | Ht 65.5 in | Wt 140.6 lb

## 2021-03-13 DIAGNOSIS — R8290 Unspecified abnormal findings in urine: Secondary | ICD-10-CM

## 2021-03-13 DIAGNOSIS — R059 Cough, unspecified: Secondary | ICD-10-CM | POA: Diagnosis not present

## 2021-03-13 LAB — POC URINALSYSI DIPSTICK (AUTOMATED)
Bilirubin, UA: NEGATIVE
Glucose, UA: NEGATIVE
Nitrite, UA: POSITIVE
Protein, UA: POSITIVE — AB
Spec Grav, UA: 1.025 (ref 1.010–1.025)
Urobilinogen, UA: 0.2 E.U./dL
pH, UA: 6 (ref 5.0–8.0)

## 2021-03-13 MED ORDER — CEPHALEXIN 500 MG PO CAPS: 500.0000 mg | ORAL_CAPSULE | Freq: Three times a day (TID) | ORAL | 0 refills | Status: DC

## 2021-03-13 NOTE — Patient Instructions (Signed)
Urinary Tract Infection, Adult  A urinary tract infection (UTI) is an infection of any part of the urinary tract. The urinary tract includes the kidneys, ureters, bladder, and urethra. These organs make, store, and get rid of urine in the body. An upper UTI affects the ureters and kidneys. A lower UTI affects the bladder and urethra. What are the causes? Most urinary tract infections are caused by bacteria in your genital area around your urethra, where urine leaves your body. These bacteria grow and cause inflammation of your urinary tract. What increases the risk? You are more likely to develop this condition if:  You have a urinary catheter that stays in place.  You are not able to control when you urinate or have a bowel movement (incontinence).  You are female and you: ? Use a spermicide or diaphragm for birth control. ? Have low estrogen levels. ? Are pregnant.  You have certain genes that increase your risk.  You are sexually active.  You take antibiotic medicines.  You have a condition that causes your flow of urine to slow down, such as: ? An enlarged prostate, if you are female. ? Blockage in your urethra. ? A kidney stone. ? A nerve condition that affects your bladder control (neurogenic bladder). ? Not getting enough to drink, or not urinating often.  You have certain medical conditions, such as: ? Diabetes. ? A weak disease-fighting system (immunesystem). ? Sickle cell disease. ? Gout. ? Spinal cord injury. What are the signs or symptoms? Symptoms of this condition include:  Needing to urinate right away (urgency).  Frequent urination. This may include small amounts of urine each time you urinate.  Pain or burning with urination.  Blood in the urine.  Urine that smells bad or unusual.  Trouble urinating.  Cloudy urine.  Vaginal discharge, if you are female.  Pain in the abdomen or the lower back. You may also have:  Vomiting or a decreased  appetite.  Confusion.  Irritability or tiredness.  A fever or chills.  Diarrhea. The first symptom in older adults may be confusion. In some cases, they may not have any symptoms until the infection has worsened. How is this diagnosed? This condition is diagnosed based on your medical history and a physical exam. You may also have other tests, including:  Urine tests.  Blood tests.  Tests for STIs (sexually transmitted infections). If you have had more than one UTI, a cystoscopy or imaging studies may be done to determine the cause of the infections. How is this treated? Treatment for this condition includes:  Antibiotic medicine.  Over-the-counter medicines to treat discomfort.  Drinking enough water to stay hydrated. If you have frequent infections or have other conditions such as a kidney stone, you may need to see a health care provider who specializes in the urinary tract (urologist). In rare cases, urinary tract infections can cause sepsis. Sepsis is a life-threatening condition that occurs when the body responds to an infection. Sepsis is treated in the hospital with IV antibiotics, fluids, and other medicines. Follow these instructions at home: Medicines  Take over-the-counter and prescription medicines only as told by your health care provider.  If you were prescribed an antibiotic medicine, take it as told by your health care provider. Do not stop using the antibiotic even if you start to feel better. General instructions  Make sure you: ? Empty your bladder often and completely. Do not hold urine for long periods of time. ? Empty your bladder after   sex. ? Wipe from front to back after urinating or having a bowel movement if you are female. Use each tissue only one time when you wipe.  Drink enough fluid to keep your urine pale yellow.  Keep all follow-up visits. This is important.   Contact a health care provider if:  Your symptoms do not get better after 1-2  days.  Your symptoms go away and then return. Get help right away if:  You have severe pain in your back or your lower abdomen.  You have a fever or chills.  You have nausea or vomiting. Summary  A urinary tract infection (UTI) is an infection of any part of the urinary tract, which includes the kidneys, ureters, bladder, and urethra.  Most urinary tract infections are caused by bacteria in your genital area.  Treatment for this condition often includes antibiotic medicines.  If you were prescribed an antibiotic medicine, take it as told by your health care provider. Do not stop using the antibiotic even if you start to feel better.  Keep all follow-up visits. This is important. This information is not intended to replace advice given to you by your health care provider. Make sure you discuss any questions you have with your health care provider. Document Revised: 06/24/2020 Document Reviewed: 06/24/2020 Elsevier Patient Education  2021 [employer]  

## 2021-03-13 NOTE — Progress Notes (Signed)
Established Patient Office Visit  Subjective:  Patient ID: [name], female    DOB: February 21, 1946  Age: 75 y.o. MRN: [id number]  CC:  Chief Complaint  Patient presents with  . Cough    Patient complains of a productive cough with green sputum x4 days  . Chills    X4 days  . Dysuria    Patient complains of odorous urine x4 days, denies pain  . Shortness of Breath    X4 days    HPI CHERI AYOTTE presents for change in urinary symptoms.  She does not complain any burning with urination but does notice some change in odor for the past few days.  Mild frequency.  No gross hematuria.  No fevers or chills.  She had UTI back in August which eventually cleared.  Denies any recent flank pain.  She also relates recent increased cough.  She has history of non-small cell lung cancer diagnosed about 8 years ago and followed by oncology at [facility]  No recent mopped assist.  No appetite or weight changes.  No dyspnea.  Cough occasionally productive of green sputum.  No fever.  Multiple drug allergies.  She is allergic to trimethoprim, Cipro, codeine.  She quit smoking around the time of her diagnosis of lung cancer about 8 years ago.  She has history of pulmonary emboli and takes Xarelto  Past Medical History:  Diagnosis Date  . Anemia   . Cancer ([place])   . Cataract   . Clotting disorder ([place])   . Community acquired pneumonia 01/22/2013  . DVT (deep venous thrombosis) ([facility])    had 2 in right calf & 1 in the left calf  . Fibromyalgia   . Hyperlipidemia   . Hypertension   . Peripheral vascular disease ([place])   . PMR (polymyalgia rheumatica) ([place]) 10/31/2017  . PONV (postoperative nausea and vomiting)   . Squamous cell lung cancer [facility]) 2013   sees Dr. Elmon Kirschner at [facility] and Dr. Jaye Beagle at [place]   . Tobacco abuse     Past Surgical History:  Procedure Laterality Date  . APPENDECTOMY    . CHOLECYSTECTOMY    . COLONOSCOPY     11/28/09 repeat in 10  years  . ESOPHAGOGASTRODUODENOSCOPY  October 2013   severe esophagitis, done at [facility]   . KNEE ARTHROSCOPY     right  . LAMINECTOMY     lumbar  . PORTACATH PLACEMENT    . TONSILLECTOMY    . VIDEO BRONCHOSCOPY  05/30/2012   Procedure: VIDEO BRONCHOSCOPY WITH FLUORO;  Surgeon: Elsie Stain, MD;  Location: [facility];  Service: Cardiopulmonary;  Laterality: N/A;    Family History  Problem Relation Age of Onset  . Cancer Father        NHL  . Hypertension Mother        family history  . Atrial fibrillation Mother   . Cancer Mother        Lung  . Anemia Unknown        FE deficiency   . Arthritis Unknown        family history  . Cancer Unknown        colon 1st degree relative <60  . Multiple sclerosis Sister     Social History   Socioeconomic History  . Marital status: Married    Spouse name: Gwenlyn Perking  . Number of children: 3  . Years of education: Not on file  . Highest education  level: Not on file  Occupational History  . Occupation: Retired Surveyor, minerals.    Employer: UNEMPLOYED  Tobacco Use  . Smoking status: Former Smoker    Quit date: 05/23/2012    Years since quitting: 8.8  . Smokeless tobacco: Never Used  Substance and Sexual Activity  . Alcohol use: Yes    Alcohol/week: 0.0 standard drinks    Comment: rare  . Drug use: No  . Sexual activity: Never  Other Topics Concern  . Not on file  Social History Narrative   ** Merged History Encounter **       Married.  Lives with husband in [place].  Independent of ADLs.   Social Determinants of Health   Financial Resource Strain: Not on file  Food Insecurity: Not on file  Transportation Needs: Not on file  Physical Activity: Not on file  Stress: Not on file  Social Connections: Not on file  Intimate Partner Violence: Not on file    Outpatient Medications Prior to Visit  Medication Sig Dispense Refill  . acetaminophen (TYLENOL) 500 MG tablet Take 1,000 mg by mouth every 6 (six) hours as needed for mild  pain.    Marland Kitchen albuterol (PROVENTIL HFA;VENTOLIN HFA) 108 (90 Base) MCG/ACT inhaler Inhale 2 puffs into the lungs every 4 (four) hours as needed for wheezing or shortness of breath. 1 Inhaler 11  . albuterol (PROVENTIL) (2.5 MG/3ML) 0.083% nebulizer solution Take 3 mLs (2.5 mg total) by nebulization every 4 (four) hours as needed for wheezing or shortness of breath. 75 mL 12  . buPROPion (WELLBUTRIN XL) 150 MG 24 hr tablet Take 1 tablet (150 mg total) by mouth daily. 30 tablet 11  . fluticasone (FLOVENT HFA) 110 MCG/ACT inhaler Inhale 2 puffs into the lungs 2 (two) times daily. 2 puffs 1 Inhaler 11  . linaclotide (LINZESS) 72 MCG capsule Take 1 capsule (72 mcg total) by mouth daily before breakfast. 30 capsule 2  . potassium chloride (KLOR-CON 10) 10 MEQ tablet Take 1 tablet (10 mEq total) by mouth daily. 90 tablet 3  . sodium chloride HYPERTONIC 3 % nebulizer solution Take by nebulization every 4 (four) hours as needed for cough. 750 mL 12  . tobramycin-dexamethasone (TOBRADEX) ophthalmic solution Place 2 drops into both eyes every 4 (four) hours while awake. 5 mL 0  . Vitamin D, Ergocalciferol, (DRISDOL) 1.25 MG (50000 UNIT) CAPS capsule Take 1 capsule (50,000 Units total) by mouth every 7 (seven) days. 12 capsule 3  . XARELTO 20 MG TABS tablet TAKE 1 TABLET BY MOUTH DAILY WITH SUPPER 30 tablet 11   No facility-administered medications prior to visit.    Allergies  Allergen Reactions  . Ciprofloxacin Other (See Comments)    Diffuse polyarthropathy, joint effusions, pain across UE and LE with Cipro 750 mg. Fluoroquinolone class avoidance recommended.   . Promethazine Other (See Comments)    Patient states was during a hospitalization was taken off but unsure of the reason  . Polymyxin B Other (See Comments)    Eye irritation  . Prednisone     Did NOT tolerate well  . Trimethoprim Other (See Comments)    Eye irritation   . Zofran [Ondansetron Hcl]     Patient states she should not take Zofran  due to her heart.  . Morphine And Related Other (See Comments)    Hard time waking up when taking morphine, codeine, and related medications    ROS Review of Systems  Constitutional: Negative for chills and fever.  Respiratory:  Positive for cough. Negative for wheezing.   Genitourinary: Positive for dysuria. Negative for flank pain and hematuria.      Objective:    Physical Exam Vitals reviewed.  Constitutional:      Appearance: She is well-developed.  Cardiovascular:     Rate and Rhythm: Regular rhythm.  Pulmonary:     Effort: Pulmonary effort is normal. No tachypnea or respiratory distress.     Breath sounds: Normal breath sounds. No decreased breath sounds.  Neurological:     Mental Status: She is alert.     BP 110/78 (BP Location: Left Arm, Patient Position: Sitting, Cuff Size: Normal)   Pulse 99   Temp 97.6 F (36.4 C) (Oral)   Ht 5' 5.5" (1.664 m)   Wt 140 lb 9.6 oz (63.8 kg)   SpO2 97%   BMI 23.04 kg/m  Wt Readings from Last 3 Encounters:  03/13/21 140 lb 9.6 oz (63.8 kg)  11/29/20 147 lb 3.2 oz (66.8 kg)  09/02/20 148 lb (67.1 kg)     Health Maintenance Due  Topic Date Due  . Hepatitis C Screening  Never done  . TETANUS/TDAP  Never done  . DEXA SCAN  Never done  . COVID-19 Vaccine (3 - Pfizer risk 4-dose series) 02/22/2020    There are no preventive care reminders to display for this patient.  Lab Results  Component Value Date   TSH 1.20 09/02/2020   Lab Results  Component Value Date   WBC 6.9 09/02/2020   HGB 14.6 09/02/2020   HCT 43.4 09/02/2020   MCV 88.9 09/02/2020   PLT 247 09/02/2020   Lab Results  Component Value Date   NA 142 09/02/2020   K 4.9 09/02/2020   CO2 29 09/02/2020   GLUCOSE 95 09/02/2020   BUN 18 09/02/2020   CREATININE 0.81 09/02/2020   BILITOT 0.5 09/02/2020   ALKPHOS 53 11/05/2018   AST 13 09/02/2020   ALT 9 09/02/2020   PROT 6.8 09/02/2020   ALBUMIN 4.5 11/05/2018   CALCIUM 10.4 09/02/2020   ANIONGAP 12  12/24/2016   GFR 65.10 11/05/2018   Lab Results  Component Value Date   CHOL 229 (H) 10/17/2015   Lab Results  Component Value Date   HDL 80.70 10/17/2015   Lab Results  Component Value Date   LDLCALC 134 (H) 10/17/2015   Lab Results  Component Value Date   TRIG 75.0 10/17/2015   Lab Results  Component Value Date   CHOLHDL 3 10/17/2015   Lab Results  Component Value Date   HGBA1C 5.1 09/02/2020      Assessment & Plan:   #1 patient presents with abnormal urine odor.  No other symptoms of UTI.  Her urine dipstick does strongly suggest possible UTI  -Urine culture sent -Stay well-hydrated -Start Keflex 500 mg 3 times daily for 7 days pending culture results  #2 cough.  Patient no respiratory distress.  Question viral.  Does have history of non-small cell lung cancer. We strongly advised her to follow-up with her oncologist if cough not improving over the next couple of weeks and sooner for any hemoptysis, fever, or other concerns  Meds ordered this encounter  Medications  . cephALEXin (KEFLEX) 500 MG capsule    Sig: Take 1 capsule (500 mg total) by mouth 3 (three) times daily.    Dispense:  21 capsule    Refill:  0    Follow-up: No follow-ups on file.    Carolann Littler, MD

## 2021-03-15 LAB — URINE CULTURE
MICRO NUMBER:: 11781427
SPECIMEN QUALITY:: ADEQUATE

## 2021-03-17 ENCOUNTER — Other Ambulatory Visit: Payer: Self-pay

## 2021-03-17 MED ORDER — BENZONATATE 100 MG PO CAPS: ORAL_CAPSULE | ORAL | 0 refills | Status: DC

## 2021-03-20 ENCOUNTER — Telehealth: Payer: Self-pay | Admitting: Family Medicine

## 2021-03-20 ENCOUNTER — Ambulatory Visit (INDEPENDENT_AMBULATORY_CARE_PROVIDER_SITE_OTHER): Payer: Medicare HMO

## 2021-03-20 ENCOUNTER — Other Ambulatory Visit: Payer: Self-pay

## 2021-03-20 ENCOUNTER — Encounter: Payer: Self-pay | Admitting: Family Medicine

## 2021-03-20 ENCOUNTER — Ambulatory Visit (INDEPENDENT_AMBULATORY_CARE_PROVIDER_SITE_OTHER): Payer: Medicare HMO | Admitting: Family Medicine

## 2021-03-20 VITALS — BP 110/88 | HR 89 | Temp 97.8°F | Wt 141.0 lb

## 2021-03-20 DIAGNOSIS — R059 Cough, unspecified: Secondary | ICD-10-CM

## 2021-03-20 DIAGNOSIS — R8290 Unspecified abnormal findings in urine: Secondary | ICD-10-CM | POA: Diagnosis not present

## 2021-03-20 DIAGNOSIS — J40 Bronchitis, not specified as acute or chronic: Secondary | ICD-10-CM

## 2021-03-20 DIAGNOSIS — N390 Urinary tract infection, site not specified: Secondary | ICD-10-CM | POA: Diagnosis not present

## 2021-03-20 LAB — POC URINALSYSI DIPSTICK (AUTOMATED)
Bilirubin, UA: NEGATIVE
Blood, UA: NEGATIVE
Glucose, UA: NEGATIVE
Ketones, UA: NEGATIVE
Leukocytes, UA: NEGATIVE
Nitrite, UA: NEGATIVE
Protein, UA: POSITIVE — AB
Spec Grav, UA: 1.02 (ref 1.010–1.025)
Urobilinogen, UA: 0.2 E.U./dL
pH, UA: 6 (ref 5.0–8.0)

## 2021-03-20 MED ORDER — SODIUM CHLORIDE 3 % IN NEBU: INHALATION_SOLUTION | RESPIRATORY_TRACT | 12 refills | Status: DC | PRN

## 2021-03-20 MED ORDER — AMOXICILLIN-POT CLAVULANATE 875-125 MG PO TABS: 1.0000 | ORAL_TABLET | Freq: Two times a day (BID) | ORAL | 0 refills | Status: DC

## 2021-03-20 MED ORDER — ALBUTEROL SULFATE (2.5 MG/3ML) 0.083% IN NEBU: 2.5000 mg | INHALATION_SOLUTION | RESPIRATORY_TRACT | 12 refills | Status: AC | PRN

## 2021-03-20 NOTE — Progress Notes (Signed)
   Subjective:    Patient ID: [name], female    DOB: 16-May-1946, 75 y.o.   MRN: [id number]  HPI Here for several issues. First she was here on 03-13-21 with a foul odor to the urine. No fever or back pain. She was also coughing at the time. Her urine culture grew pan-sensitive E coli, and she was treated with 7 days of Keflex. The odor subsided, but the cough has gotten worse. She is now producing brown sputum and she is wheezing. No chest or SOB. We obtained a UA today which is clear.    Review of Systems  Constitutional: Positive for fatigue. Negative for fever.  Respiratory: Positive for cough, chest tightness and wheezing. Negative for shortness of breath.   Cardiovascular: Negative.   Gastrointestinal: Negative.   Genitourinary: Negative.        Objective:   Physical Exam Constitutional:      Appearance: Normal appearance.  Cardiovascular:     Rate and Rhythm: Normal rate and regular rhythm.     Pulses: Normal pulses.     Heart sounds: Normal heart sounds.  Pulmonary:     Effort: Pulmonary effort is normal. No respiratory distress.     Breath sounds: No wheezing, rhonchi or rales.  Abdominal:     General: Abdomen is flat. Bowel sounds are normal. There is no distension.     Palpations: Abdomen is soft. There is no mass.     Tenderness: There is no abdominal tenderness. There is no right CVA tenderness, left CVA tenderness, guarding or rebound.     Hernia: No hernia is present.  Neurological:     Mental Status: She is alert.           Assessment & Plan:  Her UTI has resolved. However she still has a bronchitis. We will treat this with 10 days of Augmentin. Get a CXR today.  Alysia Penna, MD

## 2021-03-20 NOTE — Telephone Encounter (Signed)
Patient needs Sodium Chloride 2% refilled  [name]

## 2021-03-21 NOTE — Telephone Encounter (Signed)
Refill was sent to [place] on 03/20/2021

## 2021-04-07 ENCOUNTER — Encounter: Payer: Self-pay | Admitting: Family Medicine

## 2021-04-07 ENCOUNTER — Other Ambulatory Visit: Payer: Self-pay

## 2021-04-07 ENCOUNTER — Ambulatory Visit (INDEPENDENT_AMBULATORY_CARE_PROVIDER_SITE_OTHER): Payer: Medicare HMO | Admitting: Family Medicine

## 2021-04-07 VITALS — BP 124/80 | HR 113 | Temp 98.0°F | Wt 138.0 lb

## 2021-04-07 DIAGNOSIS — J40 Bronchitis, not specified as acute or chronic: Secondary | ICD-10-CM

## 2021-04-07 MED ORDER — DOXYCYCLINE HYCLATE 100 MG PO CAPS: 100.0000 mg | ORAL_CAPSULE | Freq: Two times a day (BID) | ORAL | 0 refills | Status: DC

## 2021-04-07 MED ORDER — ALBUTEROL SULFATE HFA 108 (90 BASE) MCG/ACT IN AERS: 2.0000 | INHALATION_SPRAY | RESPIRATORY_TRACT | 11 refills | Status: DC | PRN

## 2021-04-07 NOTE — Progress Notes (Signed)
   Subjective:    Patient ID: [name], female    DOB: 07/26/1946, 75 y.o.   MRN: [id number]  HPI Here for recurrent coughing. We saw her on 03-20-21 for a productive cough. Her exam was unremarkable and her CXR showed no acute issues. We treated her with Augmentin and she improved quite a bit. Now for the past 3 days she is coughing again. It is dry. No fever. She has tested negative for the Covid virus twice, including yesterday. Using her inhaler, nebulizer, and Benzonatate.   Review of Systems  Constitutional: Negative.   HENT: Negative.   Eyes: Negative.   Respiratory: Positive for cough and wheezing. Negative for shortness of breath.   Cardiovascular: Negative.        Objective:   Physical Exam Constitutional:      General: She is not in acute distress.    Appearance: Normal appearance.     Comments: Coughing   HENT:     Right Ear: Tympanic membrane, ear canal and external ear normal.     Left Ear: Tympanic membrane, ear canal and external ear normal.     Nose: Nose normal.     Mouth/Throat:     Pharynx: Oropharynx is clear.  Eyes:     Conjunctiva/sclera: Conjunctivae normal.  Cardiovascular:     Rate and Rhythm: Normal rate and regular rhythm.     Pulses: Normal pulses.     Heart sounds: Normal heart sounds.  Pulmonary:     Effort: Pulmonary effort is normal. No respiratory distress.     Breath sounds: Rhonchi present. No wheezing or rales.  Lymphadenopathy:     Cervical: No cervical adenopathy.  Neurological:     Mental Status: She is alert.           Assessment & Plan:  Recurrent bronchitis. Treat with 10 days of Doxycycline. Recheck as needed.  Alysia Penna, MD

## 2021-04-11 ENCOUNTER — Emergency Department (HOSPITAL_BASED_OUTPATIENT_CLINIC_OR_DEPARTMENT_OTHER)
Admission: EM | Admit: 2021-04-11 | Discharge: 2021-04-11 | Disposition: A | Discharge status: Home / Self Care | Payer: Medicare HMO | Attending: Emergency Medicine | Admitting: Emergency Medicine

## 2021-04-11 ENCOUNTER — Emergency Department (HOSPITAL_BASED_OUTPATIENT_CLINIC_OR_DEPARTMENT_OTHER): Payer: Medicare HMO

## 2021-04-11 ENCOUNTER — Other Ambulatory Visit: Payer: Self-pay

## 2021-04-11 ENCOUNTER — Encounter (HOSPITAL_BASED_OUTPATIENT_CLINIC_OR_DEPARTMENT_OTHER): Payer: Self-pay | Admitting: Emergency Medicine

## 2021-04-11 DIAGNOSIS — Z87891 Personal history of nicotine dependence: Secondary | ICD-10-CM | POA: Insufficient documentation

## 2021-04-11 DIAGNOSIS — Z9221 Personal history of antineoplastic chemotherapy: Secondary | ICD-10-CM | POA: Diagnosis not present

## 2021-04-11 DIAGNOSIS — Z923 Personal history of irradiation: Secondary | ICD-10-CM | POA: Diagnosis not present

## 2021-04-11 DIAGNOSIS — Z7951 Long term (current) use of inhaled steroids: Secondary | ICD-10-CM | POA: Insufficient documentation

## 2021-04-11 DIAGNOSIS — Z86718 Personal history of other venous thrombosis and embolism: Secondary | ICD-10-CM | POA: Insufficient documentation

## 2021-04-11 DIAGNOSIS — Z85118 Personal history of other malignant neoplasm of bronchus and lung: Secondary | ICD-10-CM | POA: Diagnosis not present

## 2021-04-11 DIAGNOSIS — J40 Bronchitis, not specified as acute or chronic: Secondary | ICD-10-CM | POA: Insufficient documentation

## 2021-04-11 DIAGNOSIS — Z86711 Personal history of pulmonary embolism: Secondary | ICD-10-CM | POA: Insufficient documentation

## 2021-04-11 DIAGNOSIS — R0602 Shortness of breath: Secondary | ICD-10-CM | POA: Diagnosis not present

## 2021-04-11 DIAGNOSIS — I10 Essential (primary) hypertension: Secondary | ICD-10-CM | POA: Diagnosis not present

## 2021-04-11 DIAGNOSIS — Z7901 Long term (current) use of anticoagulants: Secondary | ICD-10-CM | POA: Insufficient documentation

## 2021-04-11 DIAGNOSIS — R053 Chronic cough: Secondary | ICD-10-CM

## 2021-04-11 DIAGNOSIS — R059 Cough, unspecified: Secondary | ICD-10-CM | POA: Diagnosis not present

## 2021-04-11 LAB — COMPREHENSIVE METABOLIC PANEL
ALT: 16 U/L (ref 0–44)
AST: 21 U/L (ref 15–41)
Albumin: 4.3 g/dL (ref 3.5–5.0)
Alkaline Phosphatase: 37 U/L — ABNORMAL LOW (ref 38–126)
Anion gap: 10 (ref 5–15)
BUN: 14 mg/dL (ref 8–23)
CO2: 25 mmol/L (ref 22–32)
Calcium: 9.7 mg/dL (ref 8.9–10.3)
Chloride: 102 mmol/L (ref 98–111)
Creatinine, Ser: 0.72 mg/dL (ref 0.44–1.00)
GFR, Estimated: >60 mL/min (ref >60)
Glucose, Bld: 107 mg/dL — ABNORMAL HIGH (ref 70–99)
Potassium: 3.7 mmol/L (ref 3.5–5.1)
Sodium: 137 mmol/L (ref 135–145)
Total Bilirubin: 0.5 mg/dL (ref 0.3–1.2)
Total Protein: 7.3 g/dL (ref 6.5–8.1)

## 2021-04-11 LAB — CBC WITH DIFFERENTIAL/PLATELET
Abs Immature Granulocytes: 0 10*3/uL (ref 0.00–0.07)
Basophils Absolute: 0 10*3/uL (ref 0.0–0.1)
Basophils Relative: 1 %
Eosinophils Absolute: 0 10*3/uL (ref 0.0–0.5)
Eosinophils Relative: 1 %
HCT: 42.4 % (ref 36.0–46.0)
Hemoglobin: 14.3 g/dL (ref 12.0–15.0)
Immature Granulocytes: 0 %
Lymphocytes Relative: 23 %
Lymphs Abs: 0.9 10*3/uL (ref 0.7–4.0)
MCH: 29.1 pg (ref 26.0–34.0)
MCHC: 33.7 g/dL (ref 30.0–36.0)
MCV: 86.4 fL (ref 80.0–100.0)
Monocytes Absolute: 0.6 10*3/uL (ref 0.1–1.0)
Monocytes Relative: 14 %
Neutro Abs: 2.5 10*3/uL (ref 1.7–7.7)
Neutrophils Relative %: 61 %
Platelets: ADEQUATE 10*3/uL (ref 150–400)
RBC: 4.91 MIL/uL (ref 3.87–5.11)
RDW: 12.5 % (ref 11.5–15.5)
Smear Review: ADEQUATE
WBC: 4 10*3/uL (ref 4.0–10.5)
nRBC: 0 % (ref 0.0–0.2)

## 2021-04-11 LAB — BRAIN NATRIURETIC PEPTIDE: B Natriuretic Peptide: 25 pg/mL (ref 0.0–100.0)

## 2021-04-11 LAB — LACTIC ACID, PLASMA: Lactic Acid, Venous: 0.8 mmol/L (ref 0.5–1.9)

## 2021-04-11 LAB — TROPONIN I (HIGH SENSITIVITY): Troponin I (High Sensitivity): 2 ng/L (ref <18)

## 2021-04-11 LAB — LIPASE, BLOOD: Lipase: 44 U/L (ref 11–51)

## 2021-04-11 LAB — PROTIME-INR
INR: 1.2 (ref 0.8–1.2)
Prothrombin Time: 15 seconds (ref 11.4–15.2)

## 2021-04-11 LAB — D-DIMER, QUANTITATIVE: D-Dimer, Quant: 0.27 ug/mL-FEU (ref 0.00–0.50)

## 2021-04-11 MED ORDER — IPRATROPIUM-ALBUTEROL 0.5-2.5 (3) MG/3ML IN SOLN
3.0000 mL | Freq: Once | RESPIRATORY_TRACT | Status: AC
  Administered 2021-04-11: 3 mL via RESPIRATORY_TRACT
  Filled 2021-04-11: qty 3

## 2021-04-11 MED ORDER — BENZONATATE 100 MG PO CAPS
100.0000 mg | ORAL_CAPSULE | Freq: Once | ORAL | Status: AC
  Administered 2021-04-11: 100 mg via ORAL
  Filled 2021-04-11: qty 1

## 2021-04-11 MED ORDER — FLOWTUSS 2.5-200 MG/5ML PO SOLN: 5.0000 mL | ORAL | 0 refills | Status: DC | PRN

## 2021-04-11 MED ORDER — PANTOPRAZOLE SODIUM 20 MG PO TBEC: 20.0000 mg | DELAYED_RELEASE_TABLET | Freq: Every day | ORAL | 0 refills | Status: DC

## 2021-04-11 NOTE — Discharge Instructions (Addendum)
1.  Use your albuterol inhaler or nebulizer treatment every 4-6 hours for the next 3 days, then as needed. 2.  Take a daily medication for gastroesophageal reflux disease.  This is a common cause of chronic cough.  You have been prescribed Protonix to take daily.  Review this medication with your doctor to see if your symptoms are improving after continued use for over 2 weeks.  Have your doctor guide you on whether you take this medication permanently or as needed in the future. 3.  You have been prescribed a medication with hydrocodone and guaifenesin in it.  Every teaspoon has 2.5 mg of hydrocodone.  This is a very low dose.  You are to try this to see if this helps with your cough.  If you do not feel overly sedated with this medication you can take 2 teaspoons every 4 hours.

## 2021-04-11 NOTE — ED Provider Notes (Signed)
[facility] EMERGENCY DEPT Provider Note   CSN: [id number] Arrival date & time: 04/11/21  1030     History Chief Complaint  Patient presents with  . Shortness of Breath    cough    [name] is a 75 y.o. female.  HPI Patient has a history of treated lung cancer.  That was resolved 8 years ago.  She reports she does have some chronic scarring collapse in the right upper lobe.  Patient reports for about 4 weeks now she has been getting a recurrent cough.  She reports she initially had a cough that was somewhat productive of green sputum.  She reports she was treated by her doctor and got some medications and got better temporarily.  She reports the cough then resumed and has persisted with a dry harsh and paroxysmal cough.  She reports now she has been coughing so much her chest hurts.  She does not feel short of breath unless she is having a harsh coughing episode.  Cough is now nonproductive.  No fevers no chills.  She reports symptoms did not start with upper respiratory symptoms.  No lower extremity swelling or calf pain.  No abdominal pain.  Patient reports that she is compliant with her Xarelto taking it nightly.  She has prior history of recurrent PE.    Past Medical History:  Diagnosis Date  . Anemia   . Cancer ([place])   . Cataract   . Clotting disorder ([place])   . Community acquired pneumonia 01/22/2013  . DVT (deep venous thrombosis) ([facility])    had 2 in right calf & 1 in the left calf  . Fibromyalgia   . Hyperlipidemia   . Hypertension   . Peripheral vascular disease ([place])   . PMR (polymyalgia rheumatica) ([place]) 10/31/2017  . PONV (postoperative nausea and vomiting)   . Squamous cell lung cancer [facility]) 2013   sees Dr. Elmon Kirschner at [facility] and Dr. Jaye Beagle at [place]   . Tobacco abuse     Patient Active Problem List   Diagnosis Date Noted  . Bronchiectasis ([place]) 11/11/2017  . PMR (polymyalgia rheumatica) ([facility]) 10/31/2017  . Cough,  persistent 07/23/2017  . Personal history of pulmonary embolism 01/29/2013  . Acute pulmonary emboli, bilateral, with shortness of breath and back pain 01/22/2013  . Non-small cell lung cancer status post chemotherapy and radiation therapy 01/22/2013  . Squamous cell lung cancer ([place]) 08/20/2012  . Hypokalemia 05/27/2012  . Diplopia 05/26/2012  . SHOULDER PAIN 12/21/2010  . FOOT PAIN 04/18/2010  . BACK PAIN, LUMBAR 12/26/2009  . B12 deficiency 09/27/2009  . HYPERLIPIDEMIA 09/13/2009  . Anemia, unspecified 09/13/2009  . GLAUCOMA 09/13/2009  . Essential hypertension 09/13/2009  . DEEP VENOUS THROMBOPHLEBITIS, RECURRENT 09/13/2009  . DVT, HX OF 09/13/2009    Past Surgical History:  Procedure Laterality Date  . APPENDECTOMY    . CHOLECYSTECTOMY    . COLONOSCOPY     11/28/09 repeat in 10 years  . ESOPHAGOGASTRODUODENOSCOPY  October 2013   severe esophagitis, done at [facility]   . KNEE ARTHROSCOPY     right  . LAMINECTOMY     lumbar  . PORTACATH PLACEMENT    . TONSILLECTOMY    . VIDEO BRONCHOSCOPY  05/30/2012   Procedure: VIDEO BRONCHOSCOPY WITH FLUORO;  Surgeon: Elsie Stain, MD;  Location: [facility];  Service: Cardiopulmonary;  Laterality: N/A;     OB History    Gravida  0   Para  0  Term  0   Preterm  0   AB  0   Living        SAB  0   IAB  0   Ectopic  0   Multiple      Live Births              Family History  Problem Relation Age of Onset  . Cancer Father        NHL  . Hypertension Mother        family history  . Atrial fibrillation Mother   . Cancer Mother        Lung  . Anemia Other        FE deficiency   . Arthritis Other        family history  . Cancer Other        colon 1st degree relative <60  . Multiple sclerosis Sister     Social History   Tobacco Use  . Smoking status: Former Smoker    Quit date: 05/23/2012    Years since quitting: 8.8  . Smokeless tobacco: Never Used  Substance Use Topics  . Alcohol use: Yes     Alcohol/week: 0.0 standard drinks    Comment: rare  . Drug use: No    Home Medications Prior to Admission medications   Medication Sig Start Date End Date Taking? Authorizing Provider  HYDROcodone-guaiFENesin (FLOWTUSS) 2.5-200 MG/5ML SOLN Take 5 mLs by mouth every 4 (four) hours as needed (cough). 04/11/21  Yes Charlesetta Shanks, MD  pantoprazole (PROTONIX) 20 MG tablet Take 1 tablet (20 mg total) by mouth daily. 04/11/21  Yes Charlesetta Shanks, MD  acetaminophen (TYLENOL) 500 MG tablet Take 1,000 mg by mouth every 6 (six) hours as needed for mild pain.    [provider]  albuterol (PROVENTIL) (2.5 MG/3ML) 0.083% nebulizer solution Take 3 mLs (2.5 mg total) by nebulization every 4 (four) hours as needed for wheezing or shortness of breath. 03/20/21   Laurey Morale, MD  albuterol (VENTOLIN HFA) 108 (90 Base) MCG/ACT inhaler Inhale 2 puffs into the lungs every 4 (four) hours as needed for wheezing or shortness of breath. 04/07/21   Laurey Morale, MD  benzonatate (TESSALON) 100 MG capsule Take one tablet by mouth three times a day as needed for cough. 03/17/21   Burchette, Alinda Sierras, MD  buPROPion (WELLBUTRIN XL) 150 MG 24 hr tablet Take 1 tablet (150 mg total) by mouth daily. 05/11/19   Laurey Morale, MD  doxycycline (VIBRAMYCIN) 100 MG capsule Take 1 capsule (100 mg total) by mouth 2 (two) times daily for 10 days. 04/07/21 04/17/21  Laurey Morale, MD  fluticasone (FLOVENT HFA) 110 MCG/ACT inhaler Inhale 2 puffs into the lungs 2 (two) times daily. 2 puffs 05/28/18   Laurey Morale, MD  linaclotide [facility]) 72 MCG capsule Take 1 capsule (72 mcg total) by mouth daily before breakfast. 05/24/20   Laurey Morale, MD  potassium chloride (KLOR-CON 10) 10 MEQ tablet Take 1 tablet (10 mEq total) by mouth daily. 03/08/17   Laurey Morale, MD  sodium chloride HYPERTONIC 3 % nebulizer solution Take by nebulization every 4 (four) hours as needed for cough. Patient not taking: Reported on 04/07/2021 03/20/21   Laurey Morale, MD  tobramycin-dexamethasone [facility]) ophthalmic solution Place 2 drops into both eyes every 4 (four) hours while awake. 11/29/20   Laurey Morale, MD  Vitamin D, Ergocalciferol, (DRISDOL) 1.25 MG (50000 UNIT) CAPS  capsule Take 1 capsule (50,000 Units total) by mouth every 7 (seven) days. 09/06/20   Laurey Morale, MD  XARELTO 20 MG TABS tablet TAKE 1 TABLET BY MOUTH DAILY WITH SUPPER 06/21/20   Laurey Morale, MD    Allergies    Ciprofloxacin, Promethazine, Polymyxin b, Prednisone, Trimethoprim, Zofran [ondansetron hcl], and Morphine and related  Review of Systems   Review of Systems 10 Systems reviewed and negative except as per HPI Physical Exam Updated Vital Signs BP 95/67   Pulse 90   Temp (!) 97.5 F (36.4 C) (Oral)   Resp 16   Ht 5\' 5"  (1.651 m)   Wt 62.1 kg   SpO2 95%   BMI 22.80 kg/m   Physical Exam Constitutional:      Comments: Alert nontoxic.  Intermittent paroxysmal cough.  No respiratory distress  HENT:     Mouth/Throat:     Mouth: Mucous membranes are moist.     Pharynx: Oropharynx is clear.  Eyes:     Extraocular Movements: Extraocular movements intact.     Conjunctiva/sclera: Conjunctivae normal.  Cardiovascular:     Rate and Rhythm: Normal rate and regular rhythm.  Pulmonary:     Comments: Paroxysmal cough.  Scattered expiratory wheeze at the bases. Abdominal:     General: There is no distension.     Palpations: Abdomen is soft.     Tenderness: There is no abdominal tenderness. There is no guarding.  Musculoskeletal:        General: No swelling or tenderness. Normal range of motion.     Right lower leg: No edema.     Left lower leg: No edema.  Skin:    General: Skin is warm and dry.  Neurological:     General: No focal deficit present.     Mental Status: She is oriented to person, place, and time.     Motor: No weakness.     Coordination: Coordination normal.  Psychiatric:        Mood and Affect: Mood normal.     ED Results /  Procedures / Treatments   Labs (all labs ordered are listed, but only abnormal results are displayed) Labs Reviewed  COMPREHENSIVE METABOLIC PANEL - Abnormal; Notable for the following components:      Result Value   Glucose, Bld 107 (*)    Alkaline Phosphatase 37 (*)    All other components within normal limits  LIPASE, BLOOD  BRAIN NATRIURETIC PEPTIDE  LACTIC ACID, PLASMA  CBC WITH DIFFERENTIAL/PLATELET  PROTIME-INR  D-DIMER, QUANTITATIVE  LACTIC ACID, PLASMA  TROPONIN I (HIGH SENSITIVITY)    EKG None  Radiology CT Chest High Resolution  Result Date: 04/11/2021 CLINICAL DATA:  Shortness of breath, persistent dry cough and worsening shortness of breath, history of squamous cell lung cancer EXAM: CT CHEST WITHOUT CONTRAST TECHNIQUE: Multidetector CT imaging of the chest was performed following the standard protocol without intravenous contrast. High resolution imaging of the lungs, as well as inspiratory and expiratory imaging, was performed. COMPARISON:  None. FINDINGS: Cardiovascular: Aortic atherosclerosis. Normal heart size. Right coronary artery calcifications. No pericardial effusion. Mediastinum/Nodes: No enlarged mediastinal, hilar, or axillary lymph nodes. Thyroid gland, trachea, and esophagus demonstrate no significant findings. Lungs/Pleura: Unchanged post treatment appearance of the right chest, with very dense perihilar/paramedian fibrosis and consolidation and almost complete volume loss of the right middle lobe. Mild, lobular air trapping on expiratory phase imaging. No pleural effusion or pneumothorax. Upper Abdomen: No acute abnormality. Musculoskeletal: No chest wall mass  or suspicious bone lesions identified. IMPRESSION: 1. Unchanged post treatment appearance of the right chest, with very dense perihilar/paramedian fibrosis and consolidation and almost complete volume loss of the right middle lobe. No evidence of recurrent malignancy. 2. Mild, lobular air trapping on  expiratory phase imaging, suggestive of small airways disease. 3. No acute airspace disease. 4. Coronary artery disease. Aortic Atherosclerosis (ICD10-I70.0). Electronically Signed   By: Eddie Candle M.D.   On: 04/11/2021 12:39    Procedures Procedures   Medications Ordered in ED Medications  ipratropium-albuterol (DUONEB) 0.5-2.5 (3) MG/3ML nebulizer solution 3 mL (3 mLs Nebulization Given 04/11/21 1109)  benzonatate (TESSALON) capsule 100 mg (100 mg Oral Given 04/11/21 1110)    ED Course  I have reviewed the triage vital signs and the nursing notes.  Pertinent labs & imaging results that were available during my care of the patient were reviewed by me and considered in my medical decision making (see chart for details).    MDM Rules/Calculators/A&P                          Patient presents with persistent severe cough.  She has had 2 days of doxycycline.  Patient does have history of lung cancer.  She reports this cough has been recurrent several times over the past several months now.  Due to history of cancer and recurrent cough despite treatment, high resolution CT chest obtained without any evidence of usual neoplasm or any specific interstitial lung disease.  Patient has history of DVT.  She does take Xarelto and is compliant.  This time, I have low suspicion is a DVT for the etiology of the patient's symptoms.  She does not have any lower extremity edema, no calf pain.  Patient does not have pleuritic chest pain or hypoxia.  D-dimer is normal.  Patient does previously have a smoking history before lung cancer 8 years ago.  At this time have some suspicion for COPD as underlying cause of exacerbations of coughing.  Patient reports she does not tolerate any steroids and thus is not given prednisone.  Also consideration given to reflux disease.  Patient reports she does have history of esophageal stricture and dilation.  She reports this was secondary to her cancer.  At this time I am  recommending an empiric trial of Protonix for a month and follow-up with her PCP to determine if this is improving symptoms.  Patient discharged in stable condition with stable vital signs and no respiratory distress at rest.  She is to continue the doxycycline as previously prescribed.  Patient is to try Protonix.  She is given a prescription for a low-dose hydrocodone antitussive.  Reports her main allergy to these medications is oversedation.  She is counseled on trying a low-dose as prescribed initially to see if this is effective.  Patient is already taking Tessalon Per[place] without much relief.  He is counseled on more consistent and frequent use of her albuterol nebulizer and inhaler over the next several days. Final Clinical Impression(s) / ED Diagnoses Final diagnoses:  Bronchitis  Persistent cough    Rx / DC Orders ED Discharge Orders         Ordered    HYDROcodone-guaiFENesin (FLOWTUSS) 2.5-200 MG/5ML SOLN  Every 4 hours PRN        04/11/21 1406    pantoprazole (PROTONIX) 20 MG tablet  Daily        04/11/21 1407  Charlesetta Shanks, MD 04/11/21 1413

## 2021-04-11 NOTE — ED Triage Notes (Signed)
Persistent dry cough and worsening shortness of breath, recent bronchitis dx. Hx lung cancer 8 years ago. chest heaviness when coughing. On antibiotic x 2 days.

## 2021-04-17 ENCOUNTER — Ambulatory Visit (INDEPENDENT_AMBULATORY_CARE_PROVIDER_SITE_OTHER): Payer: Medicare HMO | Admitting: Family Medicine

## 2021-04-17 ENCOUNTER — Encounter: Payer: Self-pay | Admitting: Family Medicine

## 2021-04-17 ENCOUNTER — Other Ambulatory Visit: Payer: Self-pay

## 2021-04-17 VITALS — BP 128/84 | HR 105 | Temp 98.4°F | Wt 139.0 lb

## 2021-04-17 DIAGNOSIS — C3490 Malignant neoplasm of unspecified part of unspecified bronchus or lung: Secondary | ICD-10-CM

## 2021-04-17 DIAGNOSIS — J42 Unspecified chronic bronchitis: Secondary | ICD-10-CM

## 2021-04-17 DIAGNOSIS — R053 Chronic cough: Secondary | ICD-10-CM

## 2021-04-17 MED ORDER — BUDESONIDE-FORMOTEROL FUMARATE 160-4.5 MCG/ACT IN AERO: 2.0000 | INHALATION_SPRAY | Freq: Two times a day (BID) | RESPIRATORY_TRACT | 12 refills | Status: DC

## 2021-04-17 NOTE — Progress Notes (Signed)
   Subjective:    Patient ID: [name], female    DOB: 1946/05/13, 75 y.o.   MRN: [id number]  HPI Here for a chronic cough with some SOB and wheezing. She started coughing about 2 months ago, and it has become more of a problem lately. One month ago she was treated for a presumed bronchitis with Doxycycline, but this did not help. She has been using her rescue albuterol inhaler several times daily, and this helps for a hour or so. No fevers or chest pain. She went to the ED on 04-11-21 and a chest CT there revealed the chronic right sided scarring she has had since her lung cancer treatments, but no acute changes. She was told to try Protonix, in case GERD could be causing the cough. This has not helped either.    Review of Systems  Constitutional: Positive for fatigue. Negative for fever.  Respiratory: Positive for cough, chest tightness, shortness of breath and wheezing.   Cardiovascular: Negative.        Objective:   Physical Exam Constitutional:      Appearance: She is not ill-appearing.     Comments: She coughs frequently   Cardiovascular:     Rate and Rhythm: Normal rate and regular rhythm.     Pulses: Normal pulses.     Heart sounds: Normal heart sounds.  Pulmonary:     Effort: Pulmonary effort is normal. No respiratory distress.     Breath sounds: Wheezing and rhonchi present. No rales.  Musculoskeletal:     Right lower leg: No edema.     Left lower leg: No edema.  Lymphadenopathy:     Cervical: No cervical adenopathy.  Neurological:     Mental Status: She is alert.           Assessment & Plan:  Chronic cough. She seems to have developed some COPD with chronic bronchitis symptoms. She will start on Symbicort BID. She can still use albuterol for rescue. She will see Dr. Posey Pronto at [facility] on June 7. We spent 45 minutes together reviewing her records and discussing these issues.  Alysia Penna, MD

## 2021-05-02 DIAGNOSIS — J479 Bronchiectasis, uncomplicated: Secondary | ICD-10-CM | POA: Diagnosis not present

## 2021-05-02 DIAGNOSIS — R634 Abnormal weight loss: Secondary | ICD-10-CM | POA: Diagnosis not present

## 2021-05-02 DIAGNOSIS — J45909 Unspecified asthma, uncomplicated: Secondary | ICD-10-CM | POA: Diagnosis not present

## 2021-05-24 ENCOUNTER — Telehealth: Payer: Self-pay | Admitting: Family Medicine

## 2021-05-24 NOTE — Telephone Encounter (Signed)
Spoke with patient to schedule Medicare Annual Wellness Visit (AWV) either virtually or in office.  She stated she would call back to schedule she has lots of appointments right now @ [place]   awvi 12/27/16 per [facility]   please schedule at anytime with [facility] [place] 1 or 2   This should be a 45 minute visit.

## 2021-05-25 ENCOUNTER — Other Ambulatory Visit: Payer: Self-pay

## 2021-05-25 ENCOUNTER — Other Ambulatory Visit: Payer: Self-pay | Admitting: Family Medicine

## 2021-05-25 NOTE — Telephone Encounter (Signed)
Last office visit- 04/17/21 Last refill- 06/21/20-30 tabs, 11 refills  No future office visit scheduled. Can this patient receive a refill?

## 2021-06-16 DIAGNOSIS — Z923 Personal history of irradiation: Secondary | ICD-10-CM | POA: Diagnosis not present

## 2021-06-16 DIAGNOSIS — Z7901 Long term (current) use of anticoagulants: Secondary | ICD-10-CM | POA: Diagnosis not present

## 2021-06-16 DIAGNOSIS — R042 Hemoptysis: Secondary | ICD-10-CM | POA: Diagnosis not present

## 2021-06-16 DIAGNOSIS — C3490 Malignant neoplasm of unspecified part of unspecified bronchus or lung: Secondary | ICD-10-CM | POA: Diagnosis not present

## 2021-06-16 DIAGNOSIS — J471 Bronchiectasis with (acute) exacerbation: Secondary | ICD-10-CM | POA: Diagnosis not present

## 2021-06-16 DIAGNOSIS — Z7951 Long term (current) use of inhaled steroids: Secondary | ICD-10-CM | POA: Diagnosis not present

## 2021-08-17 DIAGNOSIS — J479 Bronchiectasis, uncomplicated: Secondary | ICD-10-CM | POA: Diagnosis not present

## 2021-08-17 DIAGNOSIS — R042 Hemoptysis: Secondary | ICD-10-CM | POA: Diagnosis not present

## 2021-08-17 DIAGNOSIS — Z85118 Personal history of other malignant neoplasm of bronchus and lung: Secondary | ICD-10-CM | POA: Diagnosis not present

## 2021-08-17 DIAGNOSIS — J471 Bronchiectasis with (acute) exacerbation: Secondary | ICD-10-CM | POA: Diagnosis not present

## 2021-09-19 ENCOUNTER — Other Ambulatory Visit: Payer: Self-pay

## 2021-09-19 ENCOUNTER — Ambulatory Visit: Payer: Medicare HMO | Admitting: Family Medicine

## 2021-09-20 ENCOUNTER — Ambulatory Visit (INDEPENDENT_AMBULATORY_CARE_PROVIDER_SITE_OTHER): Payer: Medicare HMO | Admitting: Family Medicine

## 2021-09-20 ENCOUNTER — Encounter: Payer: Self-pay | Admitting: Family Medicine

## 2021-09-20 VITALS — BP 110/74 | HR 87 | Temp 98.5°F | Wt 142.0 lb

## 2021-09-20 DIAGNOSIS — Z23 Encounter for immunization: Secondary | ICD-10-CM | POA: Diagnosis not present

## 2021-09-20 DIAGNOSIS — G629 Polyneuropathy, unspecified: Secondary | ICD-10-CM

## 2021-09-20 DIAGNOSIS — E538 Deficiency of other specified B group vitamins: Secondary | ICD-10-CM

## 2021-09-20 LAB — VITAMIN B12: Vitamin B-12: 236 pg/mL (ref 211–911)

## 2021-09-20 NOTE — Addendum Note (Signed)
Addended by: Wyvonne Lenz on: 09/20/2021 12:02 PM   Modules accepted: Orders

## 2021-09-20 NOTE — Addendum Note (Signed)
Addended by: Rosalyn Gess D on: 09/20/2021 11:57 AM   Modules accepted: Orders

## 2021-09-20 NOTE — Progress Notes (Signed)
   Subjective:    Patient ID: [name], female    DOB: October 14, 1946, 75 y.o.   MRN: [id number]  HPI Here to discuss problems in the right leg. About 18 months ago she developed numbness in the right great toe. Since then the numbness has spread to involve all 5 toes on the right foot. Also over the past month she has developed weakness in the right toes. She cannot move her toes, but she can move the ankle. There is no pain. She had been taking B12 weekly up until a year ago and then she stopped. The B12 level has not been check ed in a year. In June she had labs drawn at [facility], and these included normal TSH, glucose, and renal function. She has no back pain.    Review of Systems  Constitutional: Negative.   Respiratory: Negative.    Cardiovascular: Negative.   Neurological:  Positive for weakness and numbness.      Objective:   Physical Exam Constitutional:      General: She is not in acute distress.    Appearance: Normal appearance.  Cardiovascular:     Rate and Rhythm: Normal rate and regular rhythm.     Pulses: Normal pulses.     Heart sounds: Normal heart sounds.  Pulmonary:     Effort: Pulmonary effort is normal.     Breath sounds: Normal breath sounds.  Musculoskeletal:     Comments: Right foot is warm and pink. DP and PT pulses are full. No edema.   Neurological:     Mental Status: She is alert and oriented to person, place, and time.     Coordination: Coordination normal.     Gait: Gait normal.     Comments: She has decreased sensation to light touch in all 5 toes and over the dorsal foot. She cannot move her toes, but she can dorsiflex and plantar flex the right foot easily           Assessment & Plan:  Neuropathy. We will check a B12 level today. Refer to Neurology.  Alysia Penna, MD

## 2021-09-21 ENCOUNTER — Encounter: Payer: Self-pay | Admitting: Neurology

## 2021-09-29 ENCOUNTER — Ambulatory Visit: Payer: Medicare HMO

## 2021-10-04 ENCOUNTER — Telehealth: Payer: Self-pay | Admitting: Family Medicine

## 2021-10-04 NOTE — Telephone Encounter (Signed)
Patient's husband called to see if they could get tamiflu as they have gotten sick from their grandson who has the flu. Husband states she has began vomiting and is not feeling well, he is worried. Husband is currently in ER with grandson and ER doctors recommended tamiflu to help.    Please send  [place], [place] - [address] Phone:  [phone]  Fax:  [phone]       Please advise

## 2021-10-04 NOTE — Telephone Encounter (Signed)
Please advise.   Pharmacy updated

## 2021-10-04 NOTE — Telephone Encounter (Signed)
Call in Tamiflu 75 mg BID for 5 days

## 2021-10-05 ENCOUNTER — Other Ambulatory Visit: Payer: Self-pay

## 2021-10-05 MED ORDER — OSELTAMIVIR PHOSPHATE 75 MG PO CAPS: ORAL_CAPSULE | ORAL | 0 refills | Status: DC

## 2021-10-05 NOTE — Telephone Encounter (Signed)
Rx for Tamiflu was sent to pt pharmacy per Dr Sarajane Jews, pt was notified

## 2021-10-23 ENCOUNTER — Ambulatory Visit (INDEPENDENT_AMBULATORY_CARE_PROVIDER_SITE_OTHER): Payer: Medicare HMO | Admitting: Family Medicine

## 2021-10-23 ENCOUNTER — Encounter: Payer: Self-pay | Admitting: Family Medicine

## 2021-10-23 VITALS — Ht 65.0 in

## 2021-10-23 DIAGNOSIS — N390 Urinary tract infection, site not specified: Secondary | ICD-10-CM

## 2021-10-23 DIAGNOSIS — R300 Dysuria: Secondary | ICD-10-CM | POA: Diagnosis not present

## 2021-10-23 MED ORDER — NITROFURANTOIN MONOHYD MACRO 100 MG PO CAPS
100.0000 mg | ORAL_CAPSULE | Freq: Two times a day (BID) | ORAL | 0 refills | Status: AC
Start: 2021-10-23 — End: 2021-10-28

## 2021-10-23 NOTE — Progress Notes (Signed)
Virtual Visit via Video Note I connected with [name] on 10/23/21 by a video enabled telemedicine application and verified that I am speaking with the correct person using two identifiers.  Location patient: home Location provider:work office Persons participating in the virtual visit: patient, provider  I discussed the limitations of evaluation and management by telemedicine and the availability of in person appointments. The patient expressed understanding and agreed to proceed.  Chief Complaint  Patient presents with   Urinary Tract Infection   HPI: [name] is a 75 yo female with hx of PE and DVT on chronic anticoagulation, hypertension, non-small cell lung cancer, and anemia complaining of 2 days of urinary symptoms. Dysuria and frequency since Saturday and worse Sunday..  Prior history of UTIs. She has not identified exacerbating factors.  Urinary Tract Infection  This is a new problem. The current episode started in the past 7 days. The problem has been gradually worsening. There has been no fever. She is Not sexually active. There is No history of pyelonephritis. Associated symptoms include frequency and hesitancy. Pertinent negatives include no chills, discharge, flank pain, hematuria, nausea, possible pregnancy, sweats, urgency or vomiting. Her past medical history is significant for recurrent UTIs.   Pressure like pain when voiding. Negative for fever, unusual fatigue, abdominal pain, changes in bowel habits, vaginal bleeding, or skin rash.  Last Ucx in 02/2021. ISOLATE 1: Escherichia coli Abnormal    Comment: Greater than 100,000 CFU/mL of Escherichia coli  Sensitive to Cipro, Bactrim, and nitrofurantoin.  She tried Azo , which helped. Recent COVID 19 sick contact, so she cannot bring urine sample and has nobody to do it for her.  ROS: See pertinent positives and negatives per HPI.  Past Medical History:  Diagnosis Date   Anemia    Cancer ([place])     Cataract    Clotting disorder ([place])    Community acquired pneumonia 01/22/2013   DVT (deep venous thrombosis) ([place])    had 2 in right calf & 1 in the left calf   Fibromyalgia    Hyperlipidemia    Hypertension    Peripheral vascular disease (HCC)    PMR (polymyalgia rheumatica) ([place]) 10/31/2017   PONV (postoperative nausea and vomiting)    Squamous cell lung cancer ([place]) 2013   sees Dr. Elmon [facility]schner at [facility] and Dr. Jaye Beagle at [facility]    Past Surgical History:  Procedure Laterality Date   APPENDECTOMY     CHOLECYSTECTOMY     COLONOSCOPY     11/28/09 repeat in 10 years   ESOPHAGOGASTRODUODENOSCOPY  October 2013   severe esophagitis, done at [facility]     right   LAMINECTOMY     lumbar   PORTACATH PLACEMENT     TONSILLECTOMY     VIDEO BRONCHOSCOPY  05/30/2012   Procedure: [place];  Surgeon: Elsie Stain, MD;  Location: [place];  Service: Cardiopulmonary;  Laterality: N/A;    Family History  Problem Relation Age of Onset   Cancer Father        NHL   Hypertension Mother        family history   Atrial fibrillation Mother    Cancer Mother        Lung   Anemia Other        FE deficiency    Arthritis Other        family history   Cancer  Other        colon 1st degree relative <60   Multiple sclerosis Sister     Social History   Socioeconomic History   Marital status: Married    Spouse name: Gwenlyn Perking   Number of children: 3   Years of education: Not on file   Highest education level: Not on file  Occupational History   Occupation: Retired Surveyor, minerals.    Employer: UNEMPLOYED  Tobacco Use   Smoking status: Former    Types: Cigarettes    Quit date: 05/23/2012    Years since quitting: 9.4   Smokeless tobacco: Never  Substance and Sexual Activity   Alcohol use: Yes    Alcohol/week: 0.0 standard drinks    Comment: rare   Drug use: No   Sexual activity: Never  Other Topics Concern   Not  on file  Social History Narrative   ** Merged History Encounter **       Married.  Lives with husband in [place].  Independent of ADLs.   Social Determinants of Health   Financial Resource Strain: Not on file  Food Insecurity: Not on file  Transportation Needs: Not on file  Physical Activity: Not on file  Stress: Not on file  Social Connections: Not on file  Intimate Partner Violence: Not on file    Current Outpatient Medications:    acetaminophen (TYLENOL) 500 MG tablet, Take 1,000 mg by mouth every 6 (six) hours as needed for mild pain., Disp: , Rfl:    albuterol (PROVENTIL) (2.5 MG/3ML) 0.083% nebulizer solution, Take 3 mLs (2.5 mg total) by nebulization every 4 (four) hours as needed for wheezing or shortness of breath., Disp: 75 mL, Rfl: 12   albuterol (VENTOLIN HFA) 108 (90 Base) MCG/ACT inhaler, Inhale 2 puffs into the lungs every 4 (four) hours as needed for wheezing or shortness of breath., Disp: 18 g, Rfl: 11   budesonide-formoterol (SYMBICORT) 160-4.5 MCG/ACT inhaler, Inhale 2 puffs into the lungs 2 (two) times daily., Disp: 1 each, Rfl: 12   buPROPion (WELLBUTRIN XL) 150 MG 24 hr tablet, Take 1 tablet (150 mg total) by mouth daily., Disp: 30 tablet, Rfl: 11   linaclotide (LINZESS) 72 MCG capsule, Take 1 capsule (72 mcg total) by mouth daily before breakfast., Disp: 30 capsule, Rfl: 2   pantoprazole (PROTONIX) 20 MG tablet, Take 1 tablet (20 mg total) by mouth daily., Disp: 30 tablet, Rfl: 0   potassium chloride (KLOR-CON 10) 10 MEQ tablet, Take 1 tablet (10 mEq total) by mouth daily., Disp: 90 tablet, Rfl: 3   sodium chloride HYPERTONIC 3 % nebulizer solution, Take by nebulization every 4 (four) hours as needed for cough., Disp: 750 mL, Rfl: 12   Vitamin D, Ergocalciferol, (DRISDOL) 1.25 MG (50000 UNIT) CAPS capsule, Take 1 capsule (50,000 Units total) by mouth every 7 (seven) days., Disp: 12 capsule, Rfl: 3   XARELTO 20 MG TABS tablet, TAKE 1 TABLET BY MOUTH DAILY WITH  SUPPER, Disp: 30 tablet, Rfl: 11  EXAM:  VITALS per patient if applicable:Ht 5\' 5"  (1.651 m)   BMI 23.63 kg/m   GENERAL: alert, oriented, appears well and in no acute distress  HEENT: atraumatic, conjunctiva clear, no obvious abnormalities on inspection.  NECK: normal movements of the head and neck  LUNGS: on inspection no signs of respiratory distress, breathing rate appears normal, no obvious gross SOB, gasping or wheezing  CV: no obvious cyanosis  MS: moves all visible extremities without noticeable abnormality  PSYCH/NEURO: pleasant and cooperative, no  obvious depression or anxiety, speech and thought processing grossly intact  ASSESSMENT AND PLAN:  Discussed the following assessment and plan:  Dysuria We discussed possible etiologies. It is not possible to bring urine sample, so will treat empirically as UTI.  Urinary tract infection without hematuria, site unspecified - Plan: nitrofurantoin, macrocrystal-monohydrate, (MACROBID) 100 MG capsule Based on prior Ucx, empiric abx treatment with Macrobid started today. If symptoms are not greatly improved in 72 hours, UA and Ucx need to be done. Adequate hydration. Clearly instructed about warning signs.  We discussed possible serious and likely etiologies, options for evaluation and workup, limitations of telemedicine visit vs in person visit, treatment, treatment risks and precautions.  I discussed the assessment and treatment plan with the patient. The patient was provided an opportunity to ask questions and all were answered. The patient agreed with the plan and demonstrated an understanding of the instructions.  Return if symptoms worsen or fail to improve.  Jahir Halt [place], MD

## 2021-11-01 ENCOUNTER — Ambulatory Visit (INDEPENDENT_AMBULATORY_CARE_PROVIDER_SITE_OTHER): Payer: Medicare HMO | Admitting: *Deleted

## 2021-11-01 DIAGNOSIS — E538 Deficiency of other specified B group vitamins: Secondary | ICD-10-CM | POA: Diagnosis not present

## 2021-11-01 MED ORDER — CYANOCOBALAMIN 1000 MCG/ML IJ SOLN
1000.0000 ug | Freq: Once | INTRAMUSCULAR | Status: AC
  Administered 2021-11-01: 1000 ug via INTRAMUSCULAR

## 2021-11-01 NOTE — Progress Notes (Signed)
Pt here for monthly B12 injection per Dr Sarajane Jews  B12 1031mcg given IM, and pt tolerated injection well.  Next B12 injection scheduled for 2 weeks

## 2021-11-07 ENCOUNTER — Telehealth: Payer: Self-pay | Admitting: Family Medicine

## 2021-11-07 NOTE — Telephone Encounter (Signed)
Spoke with patient to schedule Medicare Annual Wellness Visit (AWV) either virtually or in office.   Patient stated she will call back   awvi 01/25/11 per [place]  please schedule at anytime with [facility] Nurse Health Advisor 1 or 2   This should be a 45 minute visit.

## 2021-11-23 ENCOUNTER — Ambulatory Visit (INDEPENDENT_AMBULATORY_CARE_PROVIDER_SITE_OTHER): Payer: Medicare HMO | Admitting: *Deleted

## 2021-11-23 DIAGNOSIS — E538 Deficiency of other specified B group vitamins: Secondary | ICD-10-CM | POA: Diagnosis not present

## 2021-11-23 MED ORDER — CYANOCOBALAMIN 1000 MCG/ML IJ SOLN
1000.0000 ug | Freq: Once | INTRAMUSCULAR | Status: AC
  Administered 2021-11-23: 14:00:00 1000 ug via INTRAMUSCULAR

## 2021-11-23 NOTE — Progress Notes (Signed)
Per orders of Cory Nafziger, NP, injection of Cyanocobalamin 1000mcg given by Akio Hudnall A. Patient tolerated injection well. 

## 2021-12-07 ENCOUNTER — Ambulatory Visit (INDEPENDENT_AMBULATORY_CARE_PROVIDER_SITE_OTHER): Payer: Medicare HMO

## 2021-12-07 DIAGNOSIS — E538 Deficiency of other specified B group vitamins: Secondary | ICD-10-CM

## 2021-12-07 MED ORDER — CYANOCOBALAMIN 1000 MCG/ML IJ SOLN
1000.0000 ug | INTRAMUSCULAR | Status: AC
  Administered 2021-12-07 – 2022-02-01 (×5): 1000 ug via INTRAMUSCULAR

## 2021-12-07 NOTE — Progress Notes (Signed)
Pt here for bimonthly B12 injection per Dr Sarajane Jews.  B12 1032mcg given IM left deltoid and pt tolerated injection well.  Next B12 injection scheduled for 12/21/21.

## 2021-12-07 NOTE — Progress Notes (Signed)
[facility] Clinic Note - Initial Visit   Date: 12/08/21  [name] MRN: [id number] DOB: 1946-03-14   Dear Dr. Sarajane Jews:  Thank you for your kind referral of [name] for consultation of right foot numbness. Although her history is well known to you, please allow [place] to reiterate it for the purpose of our medical record. The patient was accompanied to the clinic by daughter who also provides collateral information.     History of Present Illness: [name] is a 76 y.o. right-handed female with polymylagia rheumatica, hypertension, hyperlipidemia, PVD, squamous cell cancer of lung s/p chemotherapy and radiation (2013), and DVT on xeralto presenting for evaluation of right foot numbness and weakness.   Starting in early 2022, she began having numbness over the right great toe, which over time involved all her toes. She then developed weakness and inability to move her toes on the right foot.  She has chronic low back pain. No similar symptoms on the left foot or leg.  Out-side paper records, electronic medical record, and images have been reviewed where available and summarized as:  Lab Results  Component Value Date   HGBA1C 5.1 09/02/2020   Lab Results  Component Value Date   VITAMINB12 236 09/20/2021   Lab Results  Component Value Date   TSH 1.20 09/02/2020   Lab Results  Component Value Date   ESRSEDRATE 5 03/08/2017    Past Medical History:  Diagnosis Date   Anemia    Cancer ([place])    Cataract    Clotting disorder ([place])    Community acquired pneumonia 01/22/2013   DVT (deep venous thrombosis) ([place])    had 2 in right calf & 1 in the left calf   Fibromyalgia    Hyperlipidemia    Hypertension    Peripheral vascular disease ([place])    PMR (polymyalgia rheumatica) ([place]) 10/31/2017   PONV (postoperative nausea and vomiting)    Squamous cell lung cancer ([place]) 2013   sees Dr. Elmon Kirschner at [facility] and Dr. Jaye Beagle at  [facility]     Past Surgical History:  Procedure Laterality Date   APPENDECTOMY     CHOLECYSTECTOMY     COLONOSCOPY     11/28/09 repeat in 10 years   ESOPHAGOGASTRODUODENOSCOPY  October 2013   severe esophagitis, done at [facility]     right   LAMINECTOMY     lumbar   PORTACATH PLACEMENT     TONSILLECTOMY     VIDEO BRONCHOSCOPY  05/30/2012   Procedure: [place];  Surgeon: Elsie Stain, MD;  Location: [place];  Service: Cardiopulmonary;  Laterality: N/A;     Medications:  Outpatient Encounter Medications as of 12/08/2021  Medication Sig   acetaminophen (TYLENOL) 500 MG tablet Take 1,000 mg by mouth every 6 (six) hours as needed for mild pain.   albuterol (PROVENTIL) (2.5 MG/3ML) 0.083% nebulizer solution Take 3 mLs (2.5 mg total) by nebulization every 4 (four) hours as needed for wheezing or shortness of breath.   albuterol (VENTOLIN HFA) 108 (90 Base) MCG/ACT inhaler Inhale 2 puffs into the lungs every 4 (four) hours as needed for wheezing or shortness of breath.   budesonide-formoterol (SYMBICORT) 160-4.5 MCG/ACT inhaler Inhale 2 puffs into the lungs 2 (two) times daily.   buPROPion (WELLBUTRIN XL) 150 MG 24 hr tablet Take 1 tablet (150 mg total) by mouth daily.   sodium  chloride HYPERTONIC 3 % nebulizer solution Take by nebulization every 4 (four) hours as needed for cough.   XARELTO 20 MG TABS tablet TAKE 1 TABLET BY MOUTH DAILY WITH SUPPER   [DISCONTINUED] linaclotide (LINZESS) 72 MCG capsule Take 1 capsule (72 mcg total) by mouth daily before breakfast. (Patient not taking: Reported on 12/08/2021)   [DISCONTINUED] pantoprazole (PROTONIX) 20 MG tablet Take 1 tablet (20 mg total) by mouth daily. (Patient not taking: Reported on 12/08/2021)   [DISCONTINUED] potassium chloride (KLOR-CON 10) 10 MEQ tablet Take 1 tablet (10 mEq total) by mouth daily. (Patient not taking: Reported on 12/08/2021)   [DISCONTINUED]  Vitamin D, Ergocalciferol, (DRISDOL) 1.25 MG (50000 UNIT) CAPS capsule Take 1 capsule (50,000 Units total) by mouth every 7 (seven) days. (Patient not taking: Reported on 12/08/2021)   Facility-Administered Encounter Medications as of 12/08/2021  Medication   cyanocobalamin ((VITAMIN B-12)) injection 1,000 mcg    Allergies:  Allergies  Allergen Reactions   Ciprofloxacin Other (See Comments)    Diffuse polyarthropathy, joint effusions, pain across UE and LE with Cipro 750 mg. Fluoroquinolone class avoidance recommended.    Promethazine Other (See Comments)    Patient states was during a hospitalization was taken off but unsure of the reason   Polymyxin B Other (See Comments)    Eye irritation   Prednisone     Did NOT tolerate well   Trimethoprim Other (See Comments)    Eye irritation    Zofran [Ondansetron Hcl]     Patient states she should not take Zofran due to her heart.   Morphine And Related Other (See Comments)    Hard time waking up when taking morphine, codeine, and related medications    Family History: Family History  Problem Relation Age of Onset   Hypertension Mother        family history   Atrial fibrillation Mother    Cancer Mother        Lung   Cancer Father        NHL   Multiple sclerosis Sister    Anemia Other        FE deficiency    Arthritis Other        family history   Cancer Other        colon 1st degree relative <60    Social History: Social History   Tobacco Use   Smoking status: Former    Types: Cigarettes    Quit date: 05/23/2012    Years since quitting: 9.5   Smokeless tobacco: Never  Substance Use Topics   Alcohol use: Yes    Comment: rare. Drinks wine   Drug use: No   Social History   Social History Narrative   ** Merged History Encounter ** Married.  Lives with husband in [place].  Independent of ADLs.      Right Handed    Lives in a one level home     Vital Signs:  Ht 5\' 5"  (1.651 m)    BMI 23.63 kg/m     Neurological Exam: MENTAL STATUS including orientation to time, place, person, recent and remote memory, attention span and concentration, language, and fund of knowledge is normal.  Speech is not dysarthric.  CRANIAL NERVES: II:  No visual field defects.    III-IV-VI: Pupils equal round and reactive to light.  Normal conjugate, extra-ocular eye movements in all directions of gaze.  No nystagmus.  No ptosis.   V:  Normal facial sensation.    VII:  Normal facial symmetry and movements.   VIII:  Normal hearing and vestibular function.   IX-X:  Normal palatal movement.   XI:  Normal shoulder shrug and head rotation.   XII:  Normal tongue strength and range of motion, no deviation or fasciculation.  MOTOR:  No atrophy, fasciculations or abnormal movements.  No pronator drift.   Upper Extremity:  Right  Left  Deltoid  5/5   5/5   Biceps  5/5   5/5   Triceps  5/5   5/5   Infraspinatus 5/5  5/5  Medial pectoralis 5/5  5/5  Wrist extensors  5/5   5/5   Wrist flexors  5/5   5/5   Finger extensors  5/5   5/5   Finger flexors  5/5   5/5   Dorsal interossei  5/5   5/5   Abductor pollicis  5/5   5/5   Tone (Ashworth scale)  0  0   Lower Extremity:  Right  Left  Hip flexors  5/5   5/5   Hip extensors  5/5   5/5   Adductor 5/5  5/5  Abductor 5/5  5/5  Knee flexors  5/5   5/5   Knee extensors  5/5   5/5   Dorsiflexors  4/5   5/5   Plantarflexors  5/5   5/5   Eversion 3/5  5/5  Inversion 3/5  5/5  Toe extensors  2/5   5/5   Toe flexors  2/5   5/5   Tone (Ashworth scale)  0  0   MSRs:  Right        Left                  brachioradialis 2+  2+  biceps 2+  2+  triceps 2+  2+  patellar 3+  2+  ankle jerk 2+  2+  Hoffman no  no  plantar response down  down   SENSORY:  Asymmetrically reduced vibration at the right great toe; pinprick and temperature is also reduced. Romberg's sign absent.   COORDINATION/GAIT: Normal finger-to- nose-finger.  Intact rapid alternating movements  bilaterally.  Gait appears mildly antalgic, with mild dragging of the right foot.    IMPRESSION: Right foot drop and paresthesias secondary to suspected lumbar canal stenosis/L5 radiculopathy. Exam shows right foot weakness with dorsiflexion, eversion, and inversion along with brisk right patella reflex with preserved distal reflexes.  Less likely neuropathy based on asymmetric presentation.   PLAN/RECOMMENDATIONS:  MRI lumbar spine wwo contrast to evaluate for structural pathology. She has history of cancer so will order contrasted study.  Patient with severe claustrophobia and requesting MRI under anesthesia.  Further recommendations pending results.   Thank you for allowing me to participate in patient's care.  If I can answer any additional questions, I would be pleased to do so.    Sincerely,    Zackari Ruane K. Posey Pronto, DO

## 2021-12-08 ENCOUNTER — Encounter: Payer: Self-pay | Admitting: Neurology

## 2021-12-08 ENCOUNTER — Ambulatory Visit (INDEPENDENT_AMBULATORY_CARE_PROVIDER_SITE_OTHER): Payer: Medicare HMO | Admitting: Neurology

## 2021-12-08 ENCOUNTER — Other Ambulatory Visit: Payer: Self-pay

## 2021-12-08 VITALS — BP 147/91 | HR 100 | Ht 65.0 in | Wt 145.0 lb

## 2021-12-08 DIAGNOSIS — M21371 Foot drop, right foot: Secondary | ICD-10-CM

## 2021-12-08 DIAGNOSIS — M48062 Spinal stenosis, lumbar region with neurogenic claudication: Secondary | ICD-10-CM

## 2021-12-08 DIAGNOSIS — R292 Abnormal reflex: Secondary | ICD-10-CM

## 2021-12-08 NOTE — Patient Instructions (Addendum)
MRI lumbar spine without contrast  Try to avoid crossing your legs

## 2021-12-15 DIAGNOSIS — J471 Bronchiectasis with (acute) exacerbation: Secondary | ICD-10-CM | POA: Diagnosis not present

## 2021-12-15 DIAGNOSIS — J479 Bronchiectasis, uncomplicated: Secondary | ICD-10-CM | POA: Diagnosis not present

## 2021-12-15 DIAGNOSIS — J45909 Unspecified asthma, uncomplicated: Secondary | ICD-10-CM | POA: Diagnosis not present

## 2021-12-21 ENCOUNTER — Ambulatory Visit (INDEPENDENT_AMBULATORY_CARE_PROVIDER_SITE_OTHER): Payer: Medicare HMO

## 2021-12-21 DIAGNOSIS — E538 Deficiency of other specified B group vitamins: Secondary | ICD-10-CM

## 2021-12-21 NOTE — Progress Notes (Signed)
Pt here for biweeky B12 injection per Dr Sarajane Jews.  B12 1057mcg given IM left deltoid and pt tolerated injection well.  Next B12 injection scheduled for 01/04/22.

## 2021-12-28 ENCOUNTER — Telehealth: Payer: Self-pay | Admitting: Neurology

## 2021-12-28 NOTE — Telephone Encounter (Signed)
The following message was left with [employer] on 12/28/21 at 12:54 PM.   Lenna Sciara from [facility] called regarding questions about a prior authorization.

## 2021-12-28 NOTE — Telephone Encounter (Signed)
Called Melissa back and unable to leave a message due to mailbox not accepting messages.

## 2021-12-28 NOTE — Telephone Encounter (Signed)
Called [employer] and completed patients prior authorization for MRI Lumbar Spine W/ W/o Contrast (cpt: [id number]). Approval obtained from 01/02/2022-02/01/2022. Authorization #:[id number].

## 2021-12-29 ENCOUNTER — Telehealth: Payer: Self-pay | Admitting: Family Medicine

## 2021-12-29 NOTE — Progress Notes (Signed)
Anesthesia Chart Review: [name]  Case: [id number] Date/Time: 01/02/22 0945   Procedure: MRI LUMBAR WITH AND WITHOUT WITH ANESTHESIA   Anesthesia type: General   Pre-op diagnosis:      SPINAL STENOSIS LUMBAR REGION     HYPEREFLEXIA   Location: [facility]   Surgeons: Radiologist, Medication, MD       DISCUSSION: Patient is a 76 year old female scheduled for Lydia above procedure. MRI L-spine ordered by neurologist Lydia Amber, DO.  For evaluation of right foot drop and paresthesias. H&P form 12/11/21 with exam findings 12/08/21 office visit.   History includes former smoker (quit 05/23/12), post-operative N/V, HTN, HLD, polymyalgia rheumatica (2018), fibromyalgia, anemia, DVT/PE (recurrent LE DVT and/or superficial thrombophlebitis and miscarriages x6 prior to ~ 2004, warfarin discontinued in Fall 2013 due to difficult to manage INR; PE 01/22/13, long term anticoagulation planned, on Xarelto), clotting disorder (unspecified; hypercoagulable panel 01/22/13 [name]), lung cancer (05/30/12: SCC RML, stage IIIB NSCLC, s/p chemoradiation, completed 08/13/12), Port-a-cath.  For EKG and labs on arrival as indicated. Anesthesia team to evaluate on Lydia day of surgery. She is on Xarelto for DVT/PE history.    VS:  BP Readings from Last 3 Encounters:  12/08/21 (!) 147/91  09/20/21 110/74  04/17/21 128/84   Pulse Readings from Last 3 Encounters:  12/08/21 100  09/20/21 87  04/17/21 (!) 105     PROVIDERS: Lydia Morale, MD is PCP  - Lydia Kirschner, MD is oncologist. Last visit 12/21/20 [name]) - Lydia Muse, MD is pulmonologist [name]) last visit 12/15/2021.  She was seen for follow-up bronchiectasis in setting of radiation for lung cancer.  Had joined a gym affiliated with [name].  Some shortness of breath with walking longer distance but no functional limitations.  Still with cough and mucus production worse with colder weather.  Symptoms felt more related to reactive airways  disease, asthma.  Symbicort was increased from 1 puff daily to 2 puffs twice daily.  08/17/2021 chest CT felt stable. Normal spirometry.  Lydia Amber, DO is neurologist   LABS: For day of procedure as indicated. As of 05/02/21 [name]): WBC 5.1, hemoglobin 13.2, hematocrit 38.0, platelet count 318, sodium 136, potassium 4.3, BUN 13, creatinine 0.75, glucose 92, calcium 10.3, albumin 3.8, AST 13, ALT 12, alkaline phosphatase 50, TSH 1.339.   OTHER: Spirometry as outlined by [name]:  Pulmonary Function Test Results Date FVC FEV1 FEV1/FVC TLC DLCO Other  10/15/17 2.09/71% 1.5/67.4% 72 Moderate impairment, likely restriction  07/08/18 2.21/76 1.60/73 72 Normal spirometry  04/12/20 2.10/78.3 1.52/73.9 72 Normal spirometry  12/15/21 2.15/81.2 1.51/74.3 70 Stable, normal spiro     IMAGES: CT Chest 08/17/21 [name] CE): IMPRESSION: 1. With regards to patient's hemoptysis, this study demonstrates no evidence of pneumonia or neoplasm. Lydia only notable relevant finding is radiation therapy associated architectural distortion with bronchiectasis in Lydia perihilar region of Lydia right lung that is similar dating back to 12/21/2020. Notably, there is no consolidative opacities to suggest Lydia accumulation of blood within Lydia lung.  2. No significant evidence of new or recurrent neoplastic disease involving Lydia thorax  CXR 06/16/21 [name]): IMPRESSION: Stable postradiation changes in right lung.  Otherwise no evidence of acute airspace disease.   CT Chest High Resolution 04/11/21: IMPRESSION: 1. Unchanged post treatment appearance of Lydia right chest, with very dense perihilar/paramedian fibrosis and consolidation and almost complete volume loss of Lydia right middle lobe. No evidence of recurrent malignancy. 2. Mild, lobular  air trapping on expiratory phase imaging, suggestive of small airways disease. 3. No acute airspace disease. 4. Coronary artery disease.    EKG: Last EKG seen 12/24/16:  NSR   CV: Echo 05/27/12: Study Conclusions  - Left ventricle: Lydia cavity size was normal. Wall thickness    was normal. Wall motion was normal; there were no regional    wall motion abnormalities. Doppler parameters are    consistent with abnormal left ventricular relaxation    (grade 1 diastolic dysfunction).  - Mitral valve: Moderately calcified annulus involving    predominantly Lydia posterior annulus. Mildly thickened    leaflets .  - Right ventricle: Systolic pressure was increased.  - Atrial septum: No defect or patent foramen ovale was    identified.  - Pulmonary arteries: PA peak pressure: 60mm Hg (S).  Impressions:  - Lydia right ventricular systolic pressure was increased    consistent with mild pulmonary hypertension.    US Carotid 01/22/13: Summary:  - No significant extracranial carotid artery stenosis    demonstrated. Vertebrals are patent with antegrade flow.  - No evidence of extracranial carotid thrombus.    Past Medical History:  Diagnosis Date   Anemia    Cancer ([place])    Cataract    Clotting disorder ([place])    Community acquired pneumonia 01/22/2013   DVT (deep venous thrombosis) ([place])    had 2 in right calf & 1 in Lydia left calf   Fibromyalgia    Hyperlipidemia    Hypertension    Peripheral vascular disease ([facility])    PMR (polymyalgia rheumatica) ([place]) 10/31/2017   PONV (postoperative nausea and vomiting)    Squamous cell lung cancer ([place]) 2013   sees Dr. Elmon [name] at [name] and [name] at [name] abuse     Past Surgical History:  Procedure Laterality Date   APPENDECTOMY     CHOLECYSTECTOMY     COLONOSCOPY     11/28/09 repeat in 10 years   ESOPHAGOGASTRODUODENOSCOPY  October 2013   severe esophagitis, done at [facility]     right   LAMINECTOMY     lumbar   PORTACATH PLACEMENT     TONSILLECTOMY     VIDEO BRONCHOSCOPY  05/30/2012   Procedure: [place];  Surgeon: Lydia Stain, MD;  Location: [facility];  Service: Cardiopulmonary;  Laterality: N/A;    MEDICATIONS:  cyanocobalamin ((VITAMIN B-12)) injection 1,000 mcg    acetaminophen (TYLENOL) 500 MG tablet   albuterol (PROVENTIL) (2.5 MG/3ML) 0.083% nebulizer solution   albuterol (VENTOLIN HFA) 108 (90 Base) MCG/ACT inhaler   budesonide-formoterol (SYMBICORT) 160-4.5 MCG/ACT inhaler   cyanocobalamin (,VITAMIN B-12,) 1000 MCG/ML injection   XARELTO 20 MG TABS tablet   sodium chloride HYPERTONIC 3 % nebulizer solution    Myra Gianotti, PA-C Surgical Short Stay/Anesthesiology [name] Phone [phone] [name] Phone [phone] 12/29/2021 5:45 PM

## 2021-12-29 NOTE — Telephone Encounter (Signed)
Spoke with patient to schedule Medicare Annual Wellness Visit (AWV) either virtually or in office.  Patient stated she has to much going on call back in June 2023   awvi 01/25/11 per [facility]  ; please schedule at anytime with [facility] Nurse Health Advisor 1 or 2   This should be a 45 minute visit.

## 2022-01-01 ENCOUNTER — Encounter (HOSPITAL_COMMUNITY): Payer: Self-pay | Admitting: *Deleted

## 2022-01-01 ENCOUNTER — Other Ambulatory Visit: Payer: Self-pay

## 2022-01-01 NOTE — Progress Notes (Signed)
DUE TO COVID-19 ONLY ONE VISITOR IS ALLOWED TO COME WITH YOU AND STAY IN THE WAITING ROOM ONLY DURING PRE OP AND PROCEDURE DAY OF SURGERY.   PCP - Dr Cherlynn Perches Cardiologist - n/a Hematology & Oncology - Dr Elmon Kirschner Pulmonologist - Dr Loni Muse Neurologist - Narda Amber, DO  CT Chest - 08/17/21 CE CxR - 06/16/21 (2V) CE EKG - DOS Stress Test - n/a ECHO - 05/27/12 Cardiac Cath - n/a  ICD Pacemaker/Loop - n/a  Sleep Study -  n/a CPAP - none  ERAS: Clears til 7 am DOS.  Anesthesia review: Yes  STOP now taking any Aspirin (unless otherwise instructed by your surgeon), Aleve, Naproxen, Ibuprofen, Motrin, Advil, Goody's, BC's, all herbal medications, fish oil, and all vitamins.   Coronavirus Screening Covid test n/a Ambulatory Surgery  Do you have any of the following symptoms:  Cough yes/no: No Fever (>100.39F)  yes/no: No Runny nose yes/no: No Sore throat yes/no: No Difficulty breathing/shortness of breath  Yes, nothing new - hx lung cancer  Have you traveled in the last 14 days and where? yes/no: No  Patient verbalized understanding of instructions that were given via phone.

## 2022-01-01 NOTE — Anesthesia Preprocedure Evaluation (Addendum)
Anesthesia Evaluation  Patient identified by MRN, date of birth, ID band Patient awake    Reviewed: Allergy & Precautions, NPO status , Patient's Chart, lab work & pertinent test results  History of Anesthesia Complications (+) PONV and history of anesthetic complications  Airway Mallampati: II  TM Distance: >3 FB Neck ROM: Full    Dental  (+) Missing,    Pulmonary asthma , former smoker,    Pulmonary exam normal        Cardiovascular hypertension, + Peripheral Vascular Disease and + DVT (on Xarelto)  Normal cardiovascular exam  Echo 05/27/12: grade 1 DD, mild pHTN- PASP 67mmHg    Neuro/Psych negative neurological ROS  negative psych ROS   GI/Hepatic negative GI ROS, Neg liver ROS,   Endo/Other  negative endocrine ROS  Renal/GU negative Renal ROS  negative genitourinary   Musculoskeletal  (+) Fibromyalgia -polymyalgia rheumatica   Abdominal   Peds  Hematology negative hematology ROS (+)   Anesthesia Other Findings Day of surgery medications reviewed with patient.  Reproductive/Obstetrics negative OB ROS                            Anesthesia Physical Anesthesia Plan  ASA: 2  Anesthesia Plan: General   Post-op Pain Management: Minimal or no pain anticipated   Induction: Intravenous  PONV Risk Score and Plan: 4 or greater and Treatment may vary due to age or medical condition, Ondansetron and Dexamethasone  Airway Management Planned: Oral ETT  Additional Equipment: None  Intra-op Plan:   Post-operative Plan: Extubation in OR  Informed Consent: I have reviewed the patients History and Physical, chart, labs and discussed the procedure including the risks, benefits and alternatives for the proposed anesthesia with the patient or authorized representative who has indicated his/her understanding and acceptance.     Dental advisory given  Plan Discussed with: CRNA  Anesthesia  Plan Comments:        Anesthesia Quick Evaluation

## 2022-01-02 ENCOUNTER — Ambulatory Visit (HOSPITAL_COMMUNITY)
Admission: RE | Admit: 2022-01-02 | Discharge: 2022-01-02 | Disposition: A | Discharge status: Home / Self Care | Payer: Medicare HMO | Source: Ambulatory Visit | Attending: Neurology | Admitting: Neurology

## 2022-01-02 ENCOUNTER — Other Ambulatory Visit: Payer: Self-pay

## 2022-01-02 ENCOUNTER — Encounter (HOSPITAL_COMMUNITY): Payer: Self-pay

## 2022-01-02 ENCOUNTER — Ambulatory Visit (HOSPITAL_COMMUNITY): Payer: Medicare HMO | Admitting: Vascular Surgery

## 2022-01-02 ENCOUNTER — Encounter (HOSPITAL_COMMUNITY): Admission: RE | Disposition: A | Discharge status: Home / Self Care | Payer: Self-pay | Source: Ambulatory Visit

## 2022-01-02 DIAGNOSIS — R292 Abnormal reflex: Secondary | ICD-10-CM | POA: Diagnosis not present

## 2022-01-02 DIAGNOSIS — Z7901 Long term (current) use of anticoagulants: Secondary | ICD-10-CM | POA: Insufficient documentation

## 2022-01-02 DIAGNOSIS — Z79899 Other long term (current) drug therapy: Secondary | ICD-10-CM | POA: Insufficient documentation

## 2022-01-02 DIAGNOSIS — M353 Polymyalgia rheumatica: Secondary | ICD-10-CM | POA: Insufficient documentation

## 2022-01-02 DIAGNOSIS — I10 Essential (primary) hypertension: Secondary | ICD-10-CM | POA: Insufficient documentation

## 2022-01-02 DIAGNOSIS — Z86718 Personal history of other venous thrombosis and embolism: Secondary | ICD-10-CM | POA: Diagnosis not present

## 2022-01-02 DIAGNOSIS — I739 Peripheral vascular disease, unspecified: Secondary | ICD-10-CM | POA: Diagnosis not present

## 2022-01-02 DIAGNOSIS — M48061 Spinal stenosis, lumbar region without neurogenic claudication: Secondary | ICD-10-CM | POA: Diagnosis not present

## 2022-01-02 DIAGNOSIS — M48062 Spinal stenosis, lumbar region with neurogenic claudication: Secondary | ICD-10-CM

## 2022-01-02 DIAGNOSIS — M21371 Foot drop, right foot: Secondary | ICD-10-CM | POA: Insufficient documentation

## 2022-01-02 DIAGNOSIS — M797 Fibromyalgia: Secondary | ICD-10-CM | POA: Diagnosis not present

## 2022-01-02 DIAGNOSIS — Z87891 Personal history of nicotine dependence: Secondary | ICD-10-CM | POA: Insufficient documentation

## 2022-01-02 DIAGNOSIS — M47816 Spondylosis without myelopathy or radiculopathy, lumbar region: Secondary | ICD-10-CM | POA: Diagnosis not present

## 2022-01-02 DIAGNOSIS — I2510 Atherosclerotic heart disease of native coronary artery without angina pectoris: Secondary | ICD-10-CM | POA: Insufficient documentation

## 2022-01-02 DIAGNOSIS — J45909 Unspecified asthma, uncomplicated: Secondary | ICD-10-CM | POA: Insufficient documentation

## 2022-01-02 DIAGNOSIS — M5126 Other intervertebral disc displacement, lumbar region: Secondary | ICD-10-CM | POA: Diagnosis not present

## 2022-01-02 HISTORY — DX: Dyspnea, unspecified: R06.00

## 2022-01-02 HISTORY — PX: RADIOLOGY WITH ANESTHESIA: SHX6223

## 2022-01-02 SURGERY — MRI WITH ANESTHESIA
Anesthesia: General

## 2022-01-02 MED ORDER — LIDOCAINE 2% (20 MG/ML) 5 ML SYRINGE
INTRAMUSCULAR | Status: DC | PRN
  Administered 2022-01-02: 60 mg via INTRAVENOUS

## 2022-01-02 MED ORDER — DEXAMETHASONE SODIUM PHOSPHATE 10 MG/ML IJ SOLN
INTRAMUSCULAR | Status: DC | PRN
  Administered 2022-01-02: 4 mg via INTRAVENOUS

## 2022-01-02 MED ORDER — FENTANYL CITRATE (PF) 250 MCG/5ML IJ SOLN
INTRAMUSCULAR | Status: DC | PRN
Start: 2022-01-02 — End: 2022-01-02
  Administered 2022-01-02: 100 ug via INTRAVENOUS

## 2022-01-02 MED ORDER — CHLORHEXIDINE GLUCONATE 0.12 % MT SOLN
15.0000 mL | Freq: Once | OROMUCOSAL | Status: AC
  Administered 2022-01-02: 15 mL via OROMUCOSAL
  Filled 2022-01-02: qty 15

## 2022-01-02 MED ORDER — ROCURONIUM BROMIDE 10 MG/ML (PF) SYRINGE
PREFILLED_SYRINGE | INTRAVENOUS | Status: DC | PRN
  Administered 2022-01-02: 40 mg via INTRAVENOUS

## 2022-01-02 MED ORDER — PROPOFOL 10 MG/ML IV BOLUS
INTRAVENOUS | Status: DC | PRN
  Administered 2022-01-02: 120 mg via INTRAVENOUS

## 2022-01-02 MED ORDER — SUGAMMADEX SODIUM 200 MG/2ML IV SOLN
INTRAVENOUS | Status: DC | PRN
  Administered 2022-01-02: 200 mg via INTRAVENOUS

## 2022-01-02 MED ORDER — GADOBUTROL 1 MMOL/ML IV SOLN
6.0000 mL | Freq: Once | INTRAVENOUS | Status: AC | PRN
  Administered 2022-01-02: 6 mL via INTRAVENOUS

## 2022-01-02 MED ORDER — EPHEDRINE SULFATE-NACL 50-0.9 MG/10ML-% IV SOSY
PREFILLED_SYRINGE | INTRAVENOUS | Status: DC | PRN
  Administered 2022-01-02 (×2): 5 mg via INTRAVENOUS

## 2022-01-02 MED ORDER — PHENYLEPHRINE HCL-NACL 20-0.9 MG/250ML-% IV SOLN
INTRAVENOUS | Status: DC | PRN
  Administered 2022-01-02: 40 ug/min via INTRAVENOUS

## 2022-01-02 MED ORDER — LACTATED RINGERS IV SOLN: INTRAVENOUS | Status: DC

## 2022-01-02 MED ORDER — AMISULPRIDE (ANTIEMETIC) 5 MG/2ML IV SOLN: 10.0000 mg | Freq: Once | INTRAVENOUS | Status: DC | PRN

## 2022-01-02 MED ORDER — ONDANSETRON HCL 4 MG/2ML IJ SOLN
INTRAMUSCULAR | Status: DC | PRN
Start: 2022-01-02 — End: 2022-01-02
  Administered 2022-01-02: 4 mg via INTRAVENOUS

## 2022-01-02 MED ORDER — PHENYLEPHRINE 40 MCG/ML (10ML) SYRINGE FOR IV PUSH (FOR BLOOD PRESSURE SUPPORT)
PREFILLED_SYRINGE | INTRAVENOUS | Status: DC | PRN
Start: 2022-01-02 — End: 2022-01-02
  Administered 2022-01-02: 80 ug via INTRAVENOUS
  Administered 2022-01-02: 120 ug via INTRAVENOUS

## 2022-01-02 MED ORDER — ORAL CARE MOUTH RINSE: 15.0000 mL | Freq: Once | OROMUCOSAL | Status: AC

## 2022-01-02 NOTE — Transfer of Care (Signed)
Immediate Anesthesia Transfer of Care Note  Patient: [name]  Procedure(s) Performed: MRI LUMBAR WITH AND WITHOUT WITH ANESTHESIA  Patient Location: PACU  Anesthesia Type:General  Level of Consciousness: awake, alert  and oriented  Airway & Oxygen Therapy: Patient Spontanous Breathing  Post-op Assessment: Report given to RN and Post -op Vital signs reviewed and stable  Post vital signs: Reviewed and stable  Last Vitals:  Vitals Value Taken Time  BP    Temp    Pulse 91 01/02/22 1131  Resp 13 01/02/22 1131  SpO2 92 % 01/02/22 1131  Vitals shown include unvalidated device data.  Last Pain:  Vitals:   01/02/22 0742  TempSrc: Oral  PainSc: 0-No pain         Complications: No notable events documented.

## 2022-01-02 NOTE — Anesthesia Postprocedure Evaluation (Signed)
Anesthesia Post Note  Patient: [name]  Procedure(s) Performed: MRI LUMBAR WITH AND WITHOUT WITH ANESTHESIA     Patient location during evaluation: PACU Anesthesia Type: General Level of consciousness: awake and alert Pain management: pain level controlled Vital Signs Assessment: post-procedure vital signs reviewed and stable Respiratory status: spontaneous breathing, nonlabored ventilation and respiratory function stable Cardiovascular status: blood pressure returned to baseline Postop Assessment: no apparent nausea or vomiting Anesthetic complications: no   No notable events documented.  Last Vitals:  Vitals:   01/02/22 1145 01/02/22 1159  BP: 112/71 100/68  Pulse: 85 83  Resp: 11 12  Temp:  36.6 C  SpO2: 94% 96%    Last Pain:  Vitals:   01/02/22 1145  TempSrc:   PainSc: 0-No pain                 Marthenia Rolling

## 2022-01-03 ENCOUNTER — Encounter (HOSPITAL_COMMUNITY): Payer: Self-pay | Admitting: Radiology

## 2022-01-04 ENCOUNTER — Ambulatory Visit (INDEPENDENT_AMBULATORY_CARE_PROVIDER_SITE_OTHER): Payer: Medicare HMO

## 2022-01-04 DIAGNOSIS — E538 Deficiency of other specified B group vitamins: Secondary | ICD-10-CM

## 2022-01-04 NOTE — Progress Notes (Signed)
Pt here for biweekly B12 injection per Dr Sarajane Jews.  B12 1019mcg given IM left deltoid and pt tolerated injection well.  Next B12 injection scheduled for 01/18/22.

## 2022-01-17 DIAGNOSIS — C3490 Malignant neoplasm of unspecified part of unspecified bronchus or lung: Secondary | ICD-10-CM | POA: Diagnosis not present

## 2022-01-18 ENCOUNTER — Ambulatory Visit (INDEPENDENT_AMBULATORY_CARE_PROVIDER_SITE_OTHER): Payer: Medicare HMO

## 2022-01-18 DIAGNOSIS — E538 Deficiency of other specified B group vitamins: Secondary | ICD-10-CM

## 2022-01-18 MED ORDER — CYANOCOBALAMIN 1000 MCG/ML IJ SOLN: 1000.0000 ug | Freq: Once | INTRAMUSCULAR | Status: DC

## 2022-01-18 NOTE — Progress Notes (Signed)
Per orders of Laurey Morale, MD, injection of B12 given in left   deltoid by Javonn Gauger D [place] Nies. Patient tolerated injection well.  Lab Results  Component Value Date   [id number] 225 09/20/2021

## 2022-02-01 ENCOUNTER — Ambulatory Visit (INDEPENDENT_AMBULATORY_CARE_PROVIDER_SITE_OTHER): Payer: Medicare HMO

## 2022-02-01 DIAGNOSIS — E538 Deficiency of other specified B group vitamins: Secondary | ICD-10-CM | POA: Diagnosis not present

## 2022-02-01 MED ORDER — CYANOCOBALAMIN 1000 MCG/ML IJ SOLN: 1000.0000 ug | Freq: Once | INTRAMUSCULAR | Status: DC

## 2022-02-02 NOTE — Progress Notes (Signed)
Per orders of Dr. Fry, injection of Cyanocobalamin 1000 mcg given by Puneet Masoner L Kambrey Hagger. °Patient tolerated injection well.  °

## 2022-02-04 ENCOUNTER — Encounter (HOSPITAL_BASED_OUTPATIENT_CLINIC_OR_DEPARTMENT_OTHER): Payer: Self-pay | Admitting: Emergency Medicine

## 2022-02-04 ENCOUNTER — Emergency Department (HOSPITAL_BASED_OUTPATIENT_CLINIC_OR_DEPARTMENT_OTHER): Payer: Medicare HMO

## 2022-02-04 ENCOUNTER — Observation Stay (HOSPITAL_BASED_OUTPATIENT_CLINIC_OR_DEPARTMENT_OTHER)
Admission: EM | Admit: 2022-02-04 | Discharge: 2022-02-05 | Disposition: A | Discharge status: Home / Self Care | Payer: Medicare HMO | Attending: Family Medicine | Admitting: Family Medicine

## 2022-02-04 ENCOUNTER — Other Ambulatory Visit: Payer: Self-pay

## 2022-02-04 DIAGNOSIS — I2699 Other pulmonary embolism without acute cor pulmonale: Secondary | ICD-10-CM | POA: Insufficient documentation

## 2022-02-04 DIAGNOSIS — Z7951 Long term (current) use of inhaled steroids: Secondary | ICD-10-CM | POA: Insufficient documentation

## 2022-02-04 DIAGNOSIS — I10 Essential (primary) hypertension: Secondary | ICD-10-CM | POA: Diagnosis present

## 2022-02-04 DIAGNOSIS — Z20822 Contact with and (suspected) exposure to covid-19: Secondary | ICD-10-CM | POA: Diagnosis not present

## 2022-02-04 DIAGNOSIS — E538 Deficiency of other specified B group vitamins: Secondary | ICD-10-CM | POA: Diagnosis present

## 2022-02-04 DIAGNOSIS — Z86711 Personal history of pulmonary embolism: Secondary | ICD-10-CM | POA: Diagnosis present

## 2022-02-04 DIAGNOSIS — Z79899 Other long term (current) drug therapy: Secondary | ICD-10-CM | POA: Diagnosis not present

## 2022-02-04 DIAGNOSIS — K55039 Acute (reversible) ischemia of large intestine, extent unspecified: Secondary | ICD-10-CM | POA: Diagnosis not present

## 2022-02-04 DIAGNOSIS — K529 Noninfective gastroenteritis and colitis, unspecified: Secondary | ICD-10-CM | POA: Diagnosis not present

## 2022-02-04 DIAGNOSIS — K625 Hemorrhage of anus and rectum: Secondary | ICD-10-CM | POA: Diagnosis not present

## 2022-02-04 DIAGNOSIS — E785 Hyperlipidemia, unspecified: Secondary | ICD-10-CM | POA: Diagnosis present

## 2022-02-04 DIAGNOSIS — N281 Cyst of kidney, acquired: Secondary | ICD-10-CM | POA: Diagnosis not present

## 2022-02-04 DIAGNOSIS — Z87891 Personal history of nicotine dependence: Secondary | ICD-10-CM | POA: Diagnosis not present

## 2022-02-04 DIAGNOSIS — I700 Atherosclerosis of aorta: Secondary | ICD-10-CM

## 2022-02-04 DIAGNOSIS — K6389 Other specified diseases of intestine: Secondary | ICD-10-CM | POA: Diagnosis not present

## 2022-02-04 DIAGNOSIS — Z85118 Personal history of other malignant neoplasm of bronchus and lung: Secondary | ICD-10-CM | POA: Diagnosis not present

## 2022-02-04 LAB — COMPREHENSIVE METABOLIC PANEL
ALT: 16 U/L (ref 0–44)
AST: 19 U/L (ref 15–41)
Albumin: 4.5 g/dL (ref 3.5–5.0)
Alkaline Phosphatase: 46 U/L (ref 38–126)
Anion gap: 8 (ref 5–15)
BUN: 16 mg/dL (ref 8–23)
CO2: 29 mmol/L (ref 22–32)
Calcium: 10.3 mg/dL (ref 8.9–10.3)
Chloride: 104 mmol/L (ref 98–111)
Creatinine, Ser: 0.96 mg/dL (ref 0.44–1.00)
GFR, Estimated: >60 mL/min (ref >60)
Glucose, Bld: 100 mg/dL — ABNORMAL HIGH (ref 70–99)
Potassium: 3.7 mmol/L (ref 3.5–5.1)
Sodium: 141 mmol/L (ref 135–145)
Total Bilirubin: 0.9 mg/dL (ref 0.3–1.2)
Total Protein: 7.5 g/dL (ref 6.5–8.1)

## 2022-02-04 LAB — HEMOGLOBIN AND HEMATOCRIT, BLOOD
HCT: 43.8 % (ref 36.0–46.0)
HCT: 39.4 % (ref 36.0–46.0)
Hemoglobin: 14.8 g/dL (ref 12.0–15.0)
Hemoglobin: 13.9 g/dL (ref 12.0–15.0)

## 2022-02-04 LAB — CBC WITH DIFFERENTIAL/PLATELET
Abs Immature Granulocytes: 0.01 10*3/uL (ref 0.00–0.07)
Basophils Absolute: 0 10*3/uL (ref 0.0–0.1)
Basophils Relative: 0 %
Eosinophils Absolute: 0.1 10*3/uL (ref 0.0–0.5)
Eosinophils Relative: 1 %
HCT: 46.3 % — ABNORMAL HIGH (ref 36.0–46.0)
Hemoglobin: 15.9 g/dL — ABNORMAL HIGH (ref 12.0–15.0)
Immature Granulocytes: 0 %
Lymphocytes Relative: 16 %
Lymphs Abs: 1.3 10*3/uL (ref 0.7–4.0)
MCH: 30 pg (ref 26.0–34.0)
MCHC: 34.3 g/dL (ref 30.0–36.0)
MCV: 87.4 fL (ref 80.0–100.0)
Monocytes Absolute: 0.5 10*3/uL (ref 0.1–1.0)
Monocytes Relative: 6 %
Neutro Abs: 6.3 10*3/uL (ref 1.7–7.7)
Neutrophils Relative %: 77 %
Platelets: 256 10*3/uL (ref 150–400)
RBC: 5.3 MIL/uL — ABNORMAL HIGH (ref 3.87–5.11)
RDW: 12.9 % (ref 11.5–15.5)
WBC: 8.2 10*3/uL (ref 4.0–10.5)
nRBC: 0 % (ref 0.0–0.2)

## 2022-02-04 LAB — PHOSPHORUS: Phosphorus: 3 mg/dL (ref 2.5–4.6)

## 2022-02-04 LAB — LACTIC ACID, PLASMA: Lactic Acid, Venous: 0.9 mmol/L (ref 0.5–1.9)

## 2022-02-04 LAB — RESP PANEL BY RT-PCR (FLU A&B, COVID) ARPGX2
Influenza A by PCR: NEGATIVE
Influenza B by PCR: NEGATIVE
SARS Coronavirus 2 by RT PCR: NEGATIVE

## 2022-02-04 LAB — MAGNESIUM: Magnesium: 2.3 mg/dL (ref 1.7–2.4)

## 2022-02-04 MED ORDER — SODIUM CHLORIDE 0.9 % IV BOLUS: 1000.0000 mL | Freq: Once | INTRAVENOUS | Status: DC

## 2022-02-04 MED ORDER — PANTOPRAZOLE SODIUM 40 MG IV SOLR
40.0000 mg | INTRAVENOUS | Status: DC
Start: 2022-02-04 — End: 2022-02-05
  Administered 2022-02-04: 40 mg via INTRAVENOUS
  Filled 2022-02-04: qty 10

## 2022-02-04 MED ORDER — SODIUM CHLORIDE 0.9 % IV SOLN
3.0000 g | Freq: Once | INTRAVENOUS | Status: AC
Start: 2022-02-04 — End: 2022-02-04
  Administered 2022-02-04: 3 g via INTRAVENOUS

## 2022-02-04 MED ORDER — FENTANYL CITRATE PF 50 MCG/ML IJ SOSY: 12.5000 ug | PREFILLED_SYRINGE | INTRAMUSCULAR | Status: DC | PRN

## 2022-02-04 MED ORDER — SODIUM CHLORIDE 0.9 % IV SOLN
3.0000 g | Freq: Four times a day (QID) | INTRAVENOUS | Status: DC
  Administered 2022-02-04 – 2022-02-05 (×3): 3 g via INTRAVENOUS
  Filled 2022-02-04 (×5): qty 8

## 2022-02-04 MED ORDER — IOHEXOL 350 MG/ML SOLN
100.0000 mL | Freq: Once | INTRAVENOUS | Status: AC | PRN
  Administered 2022-02-04: 100 mL via INTRAVENOUS

## 2022-02-04 MED ORDER — POTASSIUM CHLORIDE IN NACL 20-0.9 MEQ/L-% IV SOLN
INTRAVENOUS | Status: AC
  Filled 2022-02-04 (×3): qty 1000

## 2022-02-04 MED ORDER — ACETAMINOPHEN 325 MG PO TABS: 650.0000 mg | ORAL_TABLET | Freq: Four times a day (QID) | ORAL | Status: DC | PRN

## 2022-02-04 MED ORDER — ACETAMINOPHEN 650 MG RE SUPP: 650.0000 mg | Freq: Four times a day (QID) | RECTAL | Status: DC | PRN

## 2022-02-04 NOTE — ED Triage Notes (Signed)
Pt reports rectal bleeding onset last night. Pt had abdominal cramping with diarrhea as well. Pt is currently taking Xerelto. ?

## 2022-02-04 NOTE — Plan of Care (Signed)
Report received from [facility]. VS taken upon pt arrival to unit. Pt oriented to call bell and room. MD paged. Admission completed. ? ? ?Problem: Education: ?Goal: Knowledge of General Education information will improve ?Description: Including pain rating scale, medication(s)/side effects and non-pharmacologic comfort measures ?Outcome: Progressing ?  ?Problem: Health Behavior/Discharge Planning: ?Goal: Ability to manage health-related needs will improve ?Outcome: Progressing ?  ?Problem: Clinical Measurements: ?Goal: Ability to maintain clinical measurements within normal limits will improve ?Outcome: Progressing ?Goal: Will remain free from infection ?Outcome: Progressing ?Goal: Diagnostic test results will improve ?Outcome: Progressing ?Goal: Respiratory complications will improve ?Outcome: Progressing ?  ?Problem: Activity: ?Goal: Risk for activity intolerance will decrease ?Outcome: Progressing ?  ?Problem: Coping: ?Goal: Level of anxiety will decrease ?Outcome: Progressing ?  ?Problem: Elimination: ?Goal: Will not experience complications related to bowel motility ?Outcome: Progressing ?Goal: Will not experience complications related to urinary retention ?Outcome: Progressing ?  ?Problem: Safety: ?Goal: Ability to remain free from injury will improve ?Outcome: Progressing ?  ?Problem: Skin Integrity: ?Goal: Risk for impaired skin integrity will decrease ?Outcome: Progressing ?  ?

## 2022-02-04 NOTE — Progress Notes (Signed)
Plan of Care Note for accepted transfer ? ? ?Patient: [name] MRN: [id number]   DOA: 02/04/2022 ? ?Facility requesting transfer: [facility]. ?Requesting Provider: Lennice Sites, DO ?Reason for transfer: Rectal bleeding. ?Facility course: Received [facility] per GI recommendation. ?Per Dr. Ronnald Nian ? ?"[name] is here with rectal bleeding.  Patient tachycardic but otherwise unremarkable vitals.  Well-appearing.  Having intermittent rectal bleeding associated with some diarrhea and some just pure blood.  No obvious large clots.  No history of GI bleeds.  On Xarelto for pulmonary embolism history.  In remission for lung cancer as well.  Not having any active abdominal pain.  Having cramping at times.  Started noticing blood around 9:00 last night.  Has had a colonoscopy over 10 years ago that was unremarkable per my review.  She has pictures on her phone of blood in her toilet.  Appears to be hematochezia.  Differential diagnosis includes GI bleed versus colitis.Will check CBC, CMP.  Will get CT of abdomen and pelvis.  Anticipate talking with GI team. ?  ?Radiology report shows colitis.  I have reviewed and interpreted labs and imaging.  Hemoglobin is 15.9.  Otherwise lab work is unremarkable.  Talked with Dr. Ardis Hughs. [facility], with gastroenterology and overall recommends observation admission to medicine given that she is on blood thinner and her comorbidities.  She has no major abdominal tenderness on exam.  She has had 2 more episodes of rectal bleeding but small volume.  CT report did not show any active bleeding.  Showed colitis of transverse and descending colon around the splenic flexure.  Possibly infectious inflammatory or ischemic.  We will start IV antibiotics per GI recommendations.  Will admit to hospitalist." ? ?Plan of care: ?The patient is accepted for admission to Telemetry unit, at [facility]. ?Please call [facility] when the patient arrives to the facility. ? ?Author: ?Reubin Milan, MD ?02/04/2022 ? ?Check [email or web address] for on-call coverage. ? ?Nursing staff, Please call [place] number on Amion as soon as patient's arrival, so appropriate admitting provider can evaluate the pt. ?

## 2022-02-04 NOTE — H&P (Signed)
History and Physical    Patient: [name] DZH:[id number] DOB: [birth year] DOA: 02/04/2022 DOS: the patient was seen and examined on 02/04/2022 PCP: Laurey Morale, MD  Patient coming from: Home  Chief Complaint:  Chief Complaint  Patient presents with   Blood In Stools   Rectal Bleeding   HPI: [name] is a 76 y.o. female with medical history significant of unspecified anemia, lung cancer, cataracts, community-acquired pneumonia, DVT on Xarelto, fibromyalgia, hyperlipidemia, hypertension, peripheral vascular disease, polymyalgia rheumatica, history of tobacco abuse who is coming to the emergency department due to persistent rectal bleeding since last night associated with diarrhea and abdominal cramping a few hours after she ate yesterday at a wedding shower.  She had an episode of emesis yesterday.  No fever, chills, sore throat, chest pain, dyspnea, palpitations, dizziness, orthopnea or pitting edema of the lower extremities.  No flank pain, dysuria, frequency hematuria.  No polyuria, polydipsia, polyphagia or blurred vision.  ED course: Initial vital signs were temperature 98 F, pulse 113, respiration 25, BP 118/98 mmHg O2 sat 97% on room air.  The patient received 3 g of Unasyn IVPB.  [facility] was contacted.  Lab work: CBC*white count of 8.2, hemoglobin 15.9 g/dL platelets 256.  Lactic acid was normal.  CMP with a glucose of 100 mg/dL but are otherwise unremarkable.  Imaging: CT angio bleed showed inflammatory wall thickening involving a segment of the distal transverse and descending colon's, centered about the splenic flexure, consistent with known a specific infectious inflammatory, or ischemic colitis.  There was also moderate mixed calcific atherosclerosis of the aorta.   Review of Systems: As mentioned in the history of present illness. All other systems reviewed and are negative. Past Medical History:  Diagnosis Date   Anemia    Cancer ([place])    Cataract     Clotting disorder ([place])    Community acquired pneumonia 01/22/2013   DVT (deep venous thrombosis) ([place])    had 2 in right calf & 1 in the left calf   Dyspnea    nothing new, hx lung cancer   Fibromyalgia    Hyperlipidemia    Hypertension    no current problems no meds since cancer tx   Peripheral vascular disease (H[facility])    PMR (polymyalgia rheumatica) ([place]) 10/31/2017   PONV (postoperative nausea and vomiting)    slow to wake up from anesthesia   Squamous cell lung cancer ([place]) 11/27/2011   sees Dr. Elmon Kirschner at [facility] and Dr. Jaye Beagle at [place] abuse    quit smoking in 2013   Past Surgical History:  Procedure Laterality Date   APPENDECTOMY     CHOLECYSTECTOMY     COLONOSCOPY     11/28/09 repeat in 10 years   ESOPHAGOGASTRODUODENOSCOPY  October 2013   severe esophagitis, done at [facility]     right   LAMINECTOMY     lumbar   PORTACATH PLACEMENT     RADIOLOGY WITH ANESTHESIA N/A 01/02/2022   Procedure: MRI LUMBAR WITH AND WITHOUT WITH ANESTHESIA;  Surgeon: Radiologist, Medication, MD;  Location: [facility];  Service: Radiology;  Laterality: N/A;   TONSILLECTOMY     VIDEO BRONCHOSCOPY  05/30/2012   Procedure: VIDEO BRONCHOSCOPY WITH FLUORO;  Surgeon: Elsie Stain, MD;  Location: [facility];  Service: Cardiopulmonary;  Laterality: N/A;   Social History:  reports that she quit smoking about 9 years ago. Her smoking use  included cigarettes. She has never used smokeless tobacco. She reports current alcohol use of about 4.0 standard drinks per week. She reports that she does not use drugs.  Allergies  Allergen Reactions   Ciprofloxacin Other (See Comments)    Diffuse polyarthropathy, joint effusions, pain across UE and LE with Cipro 750 mg. Fluoroquinolone class avoidance recommended.    Promethazine Other (See Comments)    Patient states was during a hospitalization was taken off but unsure of the reason   Polymyxin B Other (See  Comments)    Eye irritation   Prednisone     Did NOT tolerate well   Trimethoprim Other (See Comments)    Eye irritation    Zofran [Ondansetron Hcl]     Patient states she should not take Zofran due to her heart.   Morphine And Related Other (See Comments)    Hard time waking up when taking morphine, codeine, and related medications    Family History  Problem Relation Age of Onset   Hypertension Mother        family history   Atrial fibrillation Mother    Cancer Mother        Lung   Cancer Father        NHL   Multiple sclerosis Sister    Anemia Other        FE deficiency    Arthritis Other        family history   Cancer Other        colon 1st degree relative <60    Prior to Admission medications   Medication Sig Start Date End Date Taking? Authorizing Provider  acetaminophen (TYLENOL) 500 MG tablet Take 1,000 mg by mouth every 6 (six) hours as needed for mild pain.    [provider]  albuterol (PROVENTIL) (2.5 MG/3ML) 0.083% nebulizer solution Take 3 mLs (2.5 mg total) by nebulization every 4 (four) hours as needed for wheezing or shortness of breath. 03/20/21   Laurey Morale, MD  albuterol (VENTOLIN HFA) 108 (90 Base) MCG/ACT inhaler Inhale 2 puffs into the lungs every 4 (four) hours as needed for wheezing or shortness of breath. 04/07/21   Laurey Morale, MD  budesonide-formoterol [facility]) 160-4.5 MCG/ACT inhaler Inhale 2 puffs into the lungs 2 (two) times daily. 04/17/21   Laurey Morale, MD  cyanocobalamin (,VITAMIN B-12,) 1000 MCG/ML injection Inject 1,000 mcg into the muscle every 14 (fourteen) days.    [provider]  sodium chloride HYPERTONIC 3 % nebulizer solution Take by nebulization every 4 (four) hours as needed for cough. Patient not taking: Reported on 12/27/2021 03/20/21   Laurey Morale, MD  XARELTO 20 MG TABS tablet TAKE 1 TABLET BY MOUTH DAILY WITH SUPPER 05/25/21   Laurey Morale, MD    Physical Exam: Vitals:   02/04/22 1230 02/04/22  1315 02/04/22 1345 02/04/22 1550  BP: 130/75 118/89 124/85 (!) 119/91  Pulse: 94 85 90 89  Resp: 12  (!) 24 20  Temp:   98.3 F (36.8 C) 98 F (36.7 C)  TempSrc:   Oral Oral  SpO2: 98% 96% 97% 100%  Weight:      Height:       Physical Exam Vitals and nursing note reviewed.  Constitutional:      Appearance: Normal appearance.  HENT:     Head: Normocephalic.     Mouth/Throat:     Mouth: Mucous membranes are moist.  Eyes:     Pupils: Pupils are equal,  round, and reactive to light.  Neck:     Vascular: No JVD.  Cardiovascular:     Rate and Rhythm: Normal rate and regular rhythm.     Heart sounds: S1 normal and S2 normal. No murmur heard. Pulmonary:     Effort: Pulmonary effort is normal.     Breath sounds: Normal breath sounds. No wheezing, rhonchi or rales.  Abdominal:     General: Bowel sounds are normal. There is no distension.     Palpations: Abdomen is soft.     Tenderness: There is no abdominal tenderness. There is no guarding or rebound.  Musculoskeletal:     Cervical back: Neck supple.     Right lower leg: No edema.     Left lower leg: No edema.  Skin:    General: Skin is warm and dry.  Neurological:     General: No focal deficit present.     Mental Status: She is alert and oriented to person, place, and time.  Psychiatric:        Mood and Affect: Mood normal.        Behavior: Behavior normal.    Data Reviewed:  There are no new results to review at this time.  Assessment and Plan: Principal Problem:   Rectal bleeding In the setting of   Acute hemorrhagic colitis Observation/telemetry Continue IV fluids. Continue Unasyn every 6 hours. Low-dose analgesics as needed. Pantoprazole 40 mg IVP every 24 hours. Monitor hematocrit and hemoglobin.  Active Problems:   Personal history of pulmonary embolism Hold anticoagulation for now.    B12 deficiency On regular B12 supplementation.    Hyperlipidemia/aortic atherosclerosis Check fasting lipids. Will  consider starting statin. Follow-up with primary care provider.    Essential hypertension Monitor blood pressure. As needed antihypertensive.    Advance Care Planning:   Code Status: Full Code   Consults: [facility] (Dr. Owens Loffler, MD).  Family Communication:   Severity of Illness: The appropriate patient status for this patient is OBSERVATION. Observation status is judged to be reasonable and necessary in order to provide the required intensity of service to ensure the patient's safety. The patient's presenting symptoms, physical exam findings, and initial radiographic and laboratory data in the context of their medical condition is felt to place them at decreased risk for further clinical deterioration. Furthermore, it is anticipated that the patient will be medically stable for discharge from the hospital within 2 midnights of admission.   Author: Reubin Milan, MD 02/04/2022 5:40 PM  For on call review [email or web address].   This document was prepared using Dragon voice recognition software and may contain some unintended transcription errors.

## 2022-02-04 NOTE — ED Provider Notes (Addendum)
[facility] EMERGENCY DEPT Provider Note   CSN: [id number] Arrival date & time: 02/04/22  1058     History  Chief Complaint  Patient presents with   Blood In Stools   Rectal Bleeding    [name] is a 76 y.o. female.  The history is provided by the patient.  Rectal Bleeding Quality:  Bright red Amount:  Moderate Duration:  12 hours Timing:  Intermittent Context: spontaneously   Similar prior episodes: no   Relieved by:  Nothing Worsened by:  Nothing Associated symptoms: abdominal pain (cramping at times)   Associated symptoms: no dizziness, no epistaxis, no fever, no hematemesis, no light-headedness, no loss of consciousness, no recent illness and no vomiting   Risk factors: anticoagulant use (on xaretlo for PEs)   Risk factors comment:  Lung cancer, in remission     Home Medications Prior to Admission medications   Medication Sig Start Date End Date Taking? Authorizing Provider  acetaminophen (TYLENOL) 500 MG tablet Take 1,000 mg by mouth every 6 (six) hours as needed for mild pain.    [provider]  albuterol (PROVENTIL) (2.5 MG/3ML) 0.083% nebulizer solution Take 3 mLs (2.5 mg total) by nebulization every 4 (four) hours as needed for wheezing or shortness of breath. 03/20/21   Laurey Morale, MD  albuterol (VENTOLIN HFA) 108 (90 Base) MCG/ACT inhaler Inhale 2 puffs into the lungs every 4 (four) hours as needed for wheezing or shortness of breath. 04/07/21   Laurey Morale, MD  budesonide-formoterol [facility]) 160-4.5 MCG/ACT inhaler Inhale 2 puffs into the lungs 2 (two) times daily. 04/17/21   Laurey Morale, MD  cyanocobalamin (,VITAMIN B-12,) 1000 MCG/ML injection Inject 1,000 mcg into the muscle every 14 (fourteen) days.    [provider]  sodium chloride HYPERTONIC 3 % nebulizer solution Take by nebulization every 4 (four) hours as needed for cough. Patient not taking: Reported on 12/27/2021 03/20/21   Laurey Morale, MD   XARELTO 20 MG TABS tablet TAKE 1 TABLET BY MOUTH DAILY WITH SUPPER 05/25/21   Laurey Morale, MD      Allergies    Ciprofloxacin, Promethazine, Polymyxin b, Prednisone, Trimethoprim, Zofran [ondansetron hcl], and Morphine and related    Review of Systems   Review of Systems  Constitutional:  Negative for fever.  HENT:  Negative for nosebleeds.   Gastrointestinal:  Positive for abdominal pain (cramping at times) and hematochezia. Negative for hematemesis and vomiting.  Neurological:  Negative for dizziness, loss of consciousness and light-headedness.   Physical Exam Updated Vital Signs BP 118/89    Pulse 85    Temp 98 F (36.7 C) (Tympanic)    Resp 12    Ht 5\' 4"  (1.626 m)    Wt 62.6 kg    SpO2 96%    BMI 23.69 kg/m  Physical Exam Vitals and nursing note reviewed.  Constitutional:      General: She is not in acute distress.    Appearance: She is well-developed. She is not ill-appearing.  HENT:     Head: Normocephalic and atraumatic.     Nose: Nose normal.     Mouth/Throat:     Mouth: Mucous membranes are moist.  Eyes:     Extraocular Movements: Extraocular movements intact.     Conjunctiva/sclera: Conjunctivae normal.     Pupils: Pupils are equal, round, and reactive to light.  Cardiovascular:     Rate and Rhythm: Normal rate and regular rhythm.     Pulses:  Normal pulses.     Heart sounds: Normal heart sounds. No murmur heard. Pulmonary:     Effort: Pulmonary effort is normal. No respiratory distress.     Breath sounds: Normal breath sounds.  Abdominal:     General: There is no distension.     Palpations: Abdomen is soft.     Tenderness: There is no abdominal tenderness. There is no guarding.  Musculoskeletal:        General: No swelling. Normal range of motion.     Cervical back: Normal range of motion and neck supple.  Skin:    General: Skin is warm and dry.     Capillary Refill: Capillary refill takes less than 2 seconds.  Neurological:     General: No focal  deficit present.     Mental Status: She is alert.  Psychiatric:        Mood and Affect: Mood normal.    ED Results / Procedures / Treatments   Labs (all labs ordered are listed, but only abnormal results are displayed) Labs Reviewed  CBC WITH DIFFERENTIAL/PLATELET - Abnormal; Notable for the following components:      Result Value   RBC 5.30 (*)    Hemoglobin 15.9 (*)    HCT 46.3 (*)    All other components within normal limits  COMPREHENSIVE METABOLIC PANEL - Abnormal; Notable for the following components:   Glucose, Bld 100 (*)    All other components within normal limits  RESP PANEL BY RT-PCR (FLU A&B, COVID) ARPGX2  LACTIC ACID, PLASMA  LACTIC ACID, PLASMA    EKG None  Radiology CT ANGIO GI BLEED  Result Date: 02/04/2022 CLINICAL DATA:  Rectal bleeding and diarrhea for 2 days, remote history of lung cancer EXAM: CTA ABDOMEN AND PELVIS WITHOUT AND WITH CONTRAST TECHNIQUE: Multidetector CT imaging of the abdomen and pelvis was performed using the standard protocol during bolus administration of intravenous contrast. Multiplanar reconstructed images and MIPs were obtained and reviewed to evaluate the vascular anatomy. RADIATION DOSE REDUCTION: This exam was performed according to the departmental dose-optimization program which includes automated exposure control, adjustment of the mA and/or kV according to patient size and/or use of iterative reconstruction technique. CONTRAST:  194mL OMNIPAQUE IOHEXOL 350 MG/ML SOLN COMPARISON:  CT chest, 04/11/2021 FINDINGS: VASCULAR Normal contour and caliber of the abdominal aorta. Moderate mixed calcific atherosclerosis. No evidence of aneurysm, dissection, or other acute aortic pathology. Standard branching pattern of the abdominal aorta with solitary bilateral renal arteries. Review of the MIP images confirms the above findings. NON-VASCULAR Hepatobiliary: No focal liver abnormality is seen. Status post cholecystectomy. No biliary dilatation.  Pancreas: Unremarkable. No pancreatic ductal dilatation or surrounding inflammatory changes. Spleen: Normal in size without significant abnormality. Adrenals/Urinary Tract: Adrenal glands are unremarkable. Bilateral parapelvic cysts. Kidneys are otherwise normal, without renal calculi, solid lesion, or hydronephrosis. Bladder is unremarkable. Stomach/Bowel: Stomach is within normal limits. Appendix is not clearly visualized. Inflammatory wall thickening involving a segment of the distal transverse and descending colon, centered about the splenic flexure (series 12, image 22). Occasional sigmoid diverticula. Lymphatic: No enlarged abdominal or pelvic lymph nodes. Reproductive: No mass or other significant abnormality. Other: No abdominal wall hernia or abnormality. No ascites. Musculoskeletal: No acute osseous findings. IMPRESSION: 1. Inflammatory wall thickening involving a segment of the distal transverse and descending colon, centered about the splenic flexure, consistent with nonspecific infectious, inflammatory, or ischemic colitis. 2. No intraluminal contrast extravasation to precisely localize source of GI bleeding 3. Normal contour and caliber  of the abdominal aorta. No evidence of aneurysm, dissection, or other acute aortic pathology. Moderate mixed calcific atherosclerosis. Electronically Signed   By: Delanna Ahmadi M.D.   On: 02/04/2022 12:53    Procedures Procedures    Medications Ordered in ED Medications  Ampicillin-Sulbactam (UNASYN) 3 g in sodium chloride 0.9 % 100 mL IVPB (has no administration in time range)  iohexol (OMNIPAQUE) 350 MG/ML injection 100 mL (100 mLs Intravenous Contrast Given 02/04/22 1214)    ED Course/ Medical Decision Making/ A&P                           Medical Decision Making Amount and/or Complexity of Data Reviewed Labs: ordered. Radiology: ordered.  Risk Prescription drug management. Decision regarding hospitalization.   LINCOLN KLEINER is here with  rectal bleeding.  Patient tachycardic but otherwise unremarkable vitals.  Well-appearing.  Having intermittent rectal bleeding associated with some diarrhea and some just pure blood.  No obvious large clots.  No history of GI bleeds.  On Xarelto for pulmonary embolism history.  In remission for lung cancer as well.  Not having any active abdominal pain.  Having cramping at times.  Started noticing blood around 9:00 last night.  Has had a colonoscopy over 10 years ago that was unremarkable per my review.  She has pictures on her phone of blood in her toilet.  Appears to be hematochezia.  Differential diagnosis includes GI bleed versus colitis.Will check CBC, CMP.  Will get CT of abdomen and pelvis.  Anticipate talking with GI team.  Radiology report shows colitis.  I have reviewed and interpreted labs and imaging.  Hemoglobin is 15.9.  Otherwise lab work is unremarkable.  Talked with Dr. Ardis Hughs. [facility], with gastroenterology and overall recommends observation admission to medicine given that she is on blood thinner and her comorbidities.  She has no major abdominal tenderness on exam.  She has had 2 more episodes of rectal bleeding but small volume.  CT report did not show any active bleeding.  Showed colitis of transverse and descending colon around the splenic flexure.  Possibly infectious inflammatory or ischemic.  We will start IV antibiotics per GI recommendations.  Will admit to hospitalist.  This chart was dictated using voice recognition software.  Despite best efforts to proofread,  errors can occur which can change the documentation meaning.      Final Clinical Impression(s) / ED Diagnoses Final diagnoses:  Colitis    Rx / DC Orders ED Discharge Orders     None         Lennice Sites, DO 02/04/22 Williams, [place], DO 02/04/22 1353

## 2022-02-05 DIAGNOSIS — K529 Noninfective gastroenteritis and colitis, unspecified: Secondary | ICD-10-CM

## 2022-02-05 DIAGNOSIS — R109 Unspecified abdominal pain: Secondary | ICD-10-CM

## 2022-02-05 DIAGNOSIS — K559 Vascular disorder of intestine, unspecified: Secondary | ICD-10-CM | POA: Diagnosis not present

## 2022-02-05 DIAGNOSIS — R935 Abnormal findings on diagnostic imaging of other abdominal regions, including retroperitoneum: Secondary | ICD-10-CM

## 2022-02-05 DIAGNOSIS — K5901 Slow transit constipation: Secondary | ICD-10-CM | POA: Diagnosis not present

## 2022-02-05 DIAGNOSIS — K625 Hemorrhage of anus and rectum: Secondary | ICD-10-CM | POA: Diagnosis not present

## 2022-02-05 LAB — LIPID PANEL
Cholesterol: 165 mg/dL (ref 0–200)
HDL: 62 mg/dL (ref >40)
LDL Cholesterol: 94 mg/dL (ref 0–99)
Total CHOL/HDL Ratio: 2.7 RATIO
Triglycerides: 43 mg/dL (ref <150)
VLDL: 9 mg/dL (ref 0–40)

## 2022-02-05 LAB — CBC
HCT: 38.9 % (ref 36.0–46.0)
Hemoglobin: 13.1 g/dL (ref 12.0–15.0)
MCH: 30.4 pg (ref 26.0–34.0)
MCHC: 33.7 g/dL (ref 30.0–36.0)
MCV: 90.3 fL (ref 80.0–100.0)
Platelets: 167 10*3/uL (ref 150–400)
RBC: 4.31 MIL/uL (ref 3.87–5.11)
RDW: 12.9 % (ref 11.5–15.5)
WBC: 5.1 10*3/uL (ref 4.0–10.5)
nRBC: 0 % (ref 0.0–0.2)

## 2022-02-05 NOTE — Consult Note (Addendum)
Consultation  Referring Provider: [facility]/ [place] Primary Care Physician:  Laurey Morale, MD Primary Gastroenterologist:  none- remote Dr Deatra Ina  Reason for Consultation: Acute nausea /vomiting diarrhea, abdominal cramping, and hematochezia  HPI: [name] is a 76 y.o. female, with history of squamous cell lung cancer, in remission, peripheral vascular disease, PMR, hypertension, fibromyalgia, and history of clotting disorder with prior DVTs, and PE who is maintained on chronic Xarelto. Patient presented to the emergency room yesterday, after onset at about 4:30 PM on Saturday afternoon with acute nausea vomiting and diarrhea, with multiple episodes of both, she had associated chills for the rest of the evening but no documented fever.  About 7:30 PM she started passing just blood rather than diarrhea, and had multiple episodes throughout the night.  She presented to the emergency room yesterday morning. Labs show WBC 8.2/hemoglobin 15.9/hematocrit 46.3 BUN 16/creatinine 0.96 Lactate 0.9 Respiratory panel negative  Labs today WBC 5.1, hemoglobin 13.1/hematocrit 38.9  CT angio was done which showed inflammatory wall thickening involving a segment of distal transverse and descending colon centered at the splenic flexure, no extravasation to suggest active bleeding and otherwise negative study.  Patient says that she has had chronic problems with significant constipation for most of her adult life and often will go 3 to 5 days between bowel movements.  She was not particularly constipated prior to onset of this episode and think she had a bowel movement 2 days prior which was soft and normal.  She has been taking MiraLAX most days, and tries to drink a lot of water. She had not been on any new medications, no new antibiotics, no changes in activity etc.  Prior colonoscopy January 2011 per Dr. Deatra Ina normal.  Patient is feeling much better today, she is hungry, nausea and vomiting  of completely resolved, abdominal cramping has resolved and she has not passed any stool or blood since last evening.   Past Medical History:  Diagnosis Date   Anemia    Cancer ([facility])    Cataract    Clotting disorder ([facility])    Community acquired pneumonia 01/22/2013   DVT (deep venous thrombosis) ([facility])    had 2 in right calf & 1 in the left calf   Dyspnea    nothing new, hx lung cancer   Fibromyalgia    Hyperlipidemia    Hypertension    no current problems no meds since cancer tx   Peripheral vascular disease ([facility])    PMR (polymyalgia rheumatica) ([facility]) 10/31/2017   PONV (postoperative nausea and vomiting)    slow to wake up from anesthesia   Squamous cell lung cancer ([facility]) 11/27/2011   sees Dr. Elmon Kirschner at [facility] and Dr. Jaye Beagle at [facility]    quit smoking in 2013    Past Surgical History:  Procedure Laterality Date   APPENDECTOMY     CHOLECYSTECTOMY     COLONOSCOPY     11/28/09 repeat in 10 years   ESOPHAGOGASTRODUODENOSCOPY  October 2013   severe esophagitis, done at [facility]     right   LAMINECTOMY     lumbar   PORTACATH PLACEMENT     RADIOLOGY WITH ANESTHESIA N/A 01/02/2022   Procedure: MRI LUMBAR WITH AND WITHOUT WITH ANESTHESIA;  Surgeon: Radiologist, Medication, MD;  Location: [facility];  Service: Radiology;  Laterality: N/A;   TONSILLECTOMY     VIDEO BRONCHOSCOPY  05/30/2012   Procedure:  VIDEO BRONCHOSCOPY WITH FLUORO;  Surgeon: Elsie Stain, MD;  Location: [facility];  Service: Cardiopulmonary;  Laterality: N/A;    Prior to Admission medications   Medication Sig Start Date End Date Taking? Authorizing Provider  acetaminophen (TYLENOL) 500 MG tablet Take 1,000 mg by mouth every 6 (six) hours as needed for mild pain.   Yes [provider]  albuterol (PROVENTIL) (2.5 MG/3ML) 0.083% nebulizer solution Take 3 mLs (2.5 mg total) by nebulization every 4 (four) hours as needed for wheezing or shortness  of breath. 03/20/21  Yes Laurey Morale, MD  albuterol (VENTOLIN HFA) 108 (90 Base) MCG/ACT inhaler Inhale 2 puffs into the lungs every 4 (four) hours as needed for wheezing or shortness of breath. 04/07/21  Yes Laurey Morale, MD  budesonide-formoterol [facility]) 160-4.5 MCG/ACT inhaler Inhale 2 puffs into the lungs 2 (two) times daily. 04/17/21  Yes Laurey Morale, MD  cyanocobalamin (,VITAMIN B-12,) 1000 MCG/ML injection Inject 1,000 mcg into the muscle every 14 (fourteen) days.   Yes [provider]  XARELTO 20 MG TABS tablet TAKE 1 TABLET BY MOUTH DAILY WITH SUPPER Patient taking differently: 20 mg daily with supper. 05/25/21  Yes Laurey Morale, MD  sodium chloride HYPERTONIC 3 % nebulizer solution Take by nebulization every 4 (four) hours as needed for cough. Patient not taking: Reported on 02/04/2022 03/20/21   Laurey Morale, MD    Current Facility-Administered Medications  Medication Dose Route Frequency Provider Last Rate Last Admin   0.9 % NaCl with KCl 20 mEq/ L  infusion   Intravenous Continuous Reubin Milan, MD 100 mL/hr at 02/05/22 0410 New Bag at 02/05/22 0410   acetaminophen (TYLENOL) tablet 650 mg  650 mg Oral Q6H PRN Reubin Milan, MD       Or   acetaminophen (TYLENOL) suppository 650 mg  650 mg Rectal Q6H PRN Reubin Milan, MD       Ampicillin-Sulbactam (UNASYN) 3 g in sodium chloride 0.9 % 100 mL IVPB  3 g Intravenous Q6H Reubin Milan, MD 200 mL/hr at 02/05/22 0737 3 g at 02/05/22 0737   fentaNYL (SUBLIMAZE) injection 12.5 mcg  12.5 mcg Intravenous Q2H PRN Reubin Milan, MD        Allergies as of 02/04/2022 - Review Complete 02/04/2022  Allergen Reaction Noted   Ciprofloxacin Other (See Comments) 08/09/2018   Promethazine Other (See Comments) 03/20/2013   Polymyxin b Other (See Comments) 11/29/2020   Prednisone  11/11/2017   Trimethoprim Other (See Comments) 11/29/2020   Zofran Alvis Lemmings hcl]  01/24/2013   Morphine and related  Other (See Comments) 05/07/2015    Family History  Problem Relation Age of Onset   Hypertension Mother        family history   Atrial fibrillation Mother    Cancer Mother        Lung   Cancer Father        NHL   Multiple sclerosis Sister    Anemia Other        FE deficiency    Arthritis Other        family history   Cancer Other        colon 1st degree relative <60    Social History   Socioeconomic History   Marital status: Married    Spouse name: Gwenlyn Perking   Number of children: 3   Years of education: Not on file   Highest education level: Not on file  Occupational History  Occupation: Retired Surveyor, minerals.    Employer: UNEMPLOYED  Tobacco Use   Smoking status: Former    Types: Cigarettes    Quit date: 05/23/2012    Years since quitting: 9.7   Smokeless tobacco: Never   Tobacco comments:    Smoking since age 84 yrs old, quit 05/23/12  Vaping Use   Vaping Use: Never used  Substance and Sexual Activity   Alcohol use: Yes    Alcohol/week: 4.0 standard drinks    Types: 4 Glasses of wine per week   Drug use: No   Sexual activity: Not Currently    Birth control/protection: Post-menopausal  Other Topics Concern   Not on file  Social History Narrative   ** Merged History Encounter ** Married.  Lives with husband in [place].  Independent of ADLs.      Right Handed    Lives in a one level home    Social Determinants of Health   Financial Resource Strain: Not on file  Food Insecurity: Not on file  Transportation Needs: Not on file  Physical Activity: Not on file  Stress: Not on file  Social Connections: Not on file  Intimate Partner Violence: Not on file    Review of Systems: Pertinent positive and negative review of systems were noted in the above HPI section.  All other review of systems was otherwise negative.   Physical Exam: Vital signs in last 24 hours: Temp:  [98 F (36.7 C)-98.5 F (36.9 C)] 98 F (36.7 C) (03/13 0443) Pulse Rate:  [80-115] 83 (03/13  0443) Resp:  [12-27] 16 (03/13 0443) BP: (103-130)/(67-98) 116/74 (03/13 0443) SpO2:  [96 %-100 %] 96 % (03/13 0443) Weight:  [62.6 kg] 62.6 kg (03/12 1116) Last BM Date : 02/04/22 General:   Alert,  Well-developed, well-nourished, older female ,pleasant and cooperative in NAD Head:  Normocephalic and atraumatic. Eyes:  Sclera clear, no icterus.   Conjunctiva pink. Ears:  Normal auditory acuity. Nose:  No deformity, discharge,  or lesions. Mouth:  No deformity or lesions.   Neck:  Supple; no masses or thyromegaly. Lungs:  Clear throughout to auscultation.   No wheezes, crackles, or rhonchi.  Heart:  Regular rate and rhythm; no murmurs, clicks, rubs,  or gallops. Abdomen:  Soft,nontender, BS active,nonpalp mass or hsm.   Rectal:  not done Msk:  Symmetrical without gross deformities. . Pulses:  Normal pulses noted. Extremities:  Without clubbing or edema. Neurologic:  Alert and  oriented x4;  grossly normal neurologically. Skin:  Intact without significant lesions or rashes.. Psych:  Alert and cooperative. Normal mood and affect.  Intake/Output from previous day: 03/12 0701 - 03/13 0700 In: 1494.6 [P.O.:60; I.V.:1134.6; IV Piggyback:300] Out: -  Intake/Output this shift: Total I/O In: 349.9 [I.V.:249.9; IV Piggyback:100] Out: -   Lab Results: Recent Labs    02/04/22 1125 02/04/22 1648 02/04/22 2150 02/05/22 0419  WBC 8.2  --   --  5.1  HGB 15.9* 14.8 13.9 13.1  HCT 46.3* 43.8 39.4 38.9  PLT 256  --   --  167   BMET Recent Labs    02/04/22 1125  NA 141  K 3.7  CL 104  CO2 29  GLUCOSE 100*  BUN 16  CREATININE 0.96  CALCIUM 10.3   LFT Recent Labs    02/04/22 1125  PROT 7.5  ALBUMIN 4.5  AST 19  ALT 16  ALKPHOS 46  BILITOT 0.9   PT/INR No results for input(s): LABPROT, INR in the last 72 hours.  Hepatitis Panel No results for input(s): HEPBSAG, HCVAB, HEPAIGM, HEPBIGM in the last 72 hours.    IMPRESSION:   #16 76 year old female with an episode of  acute segmental colitis, involving the splenic flexure. By history I suspect this was an acute segmental ischemic colitis, which involves small vessels, and typically there will be resolution of symptoms within 3 to 4 days of onset.  Symptoms have just about resolved.  Consider acute infectious colitis though less likely and again symptoms have resolved, so further work-up not necessary.  #2 chronic constipation #3 colon cancer screening-overdue for colonoscopy #4 history of hypercoagulability with prior DVT/PE and on chronic Xarelto #5 history of PMR #6.  History of squamous cell lung CA in remission #7 hypertension #8 asthma    PLAN: Advance to soft diet If patient tolerating p.o.'s without significant abdominal pain or recurrence of nausea vomiting, I think she can be discharged to home. We discussed importance of hydration, at least 60 ounces of water daily chronically Continue MiraLAX 17 g in 8 ounces of water daily, suggested she may want to try Senokot S1-2 at bedtime in addition Patient will need outpatient follow-up with GI, and once acute episode has resolved will need to be scheduled for outpatient colonoscopy. Her daughter had given her Dr. Lorane Gell name, and patient said she had plan to see her for follow-up colonoscopy though she thought that Dr. Collene Mares was part of our group.  Patient will decide regarding follow-up with Dr. Collene Mares versus [facility] and was encouraged to make an appointment with one of our 2 groups, on discharge   Amy Onamia PA-C 02/05/2022, 9:45 AM  GI ATTENDING  History, laboratories, x-rays personally reviewed.  Patient personally seen, interviewed, and examined.  Patient's husband is in the room.  Agree with comprehensive consultation note as outlined above (I personally participated in greater than 50% of all elements as outlined above including the assessment and plan). 76 year old female with chronic constipation presents with acute abdominal  pain followed by rectal bleeding.  This in the face of chronic anticoagulation therapy.  Her clinical history and x-rays are most consistent with acute ischemic colitis.  She is not toxic.  She did have a bowel movement late this morning after tolerating breakfast.  Less blood.  I agree that the patient can be discharged later today as she is tolerating diet and her abdominal exam is benign.  I discussed with her and her husband various risk factors for ischemic colitis.  I did recommend MiraLAX for her chronic constipation.  In terms of Xarelto therapy, she could resume this in the next day or 2 (when the bleeding has resolved).  We will sign off, but are available for questions or recurrent problems.  Thank you.  Docia Chuck. Geri Seminole., M.D. [facility] Division of Gastroenterology

## 2022-02-05 NOTE — Discharge Summary (Signed)
Physician Discharge Summary   Patient: [name] MRN: [id number] DOB: 08/14/1946  Admit date:     02/04/2022  Discharge date: 02/05/22  Discharge Physician: [name]   PCP: Lydia Morale, MD   Recommendations at discharge:  Follow up with PCP [name] in 1-2 weeks [name]: please check Hgb at follow up Follow up with GI in 4-6 weeks        Discharge Diagnoses: Principal Problem:   Acute ischemic colitis Active Problems:   B12 deficiency   Hyperlipidemia   Essential hypertension   Personal history of pulmonary embolism   Aortic atherosclerosis [facility])       Hospital Course: [name] is a 76 y.o. F with PMR, DVT/PE on Xarelto last in 2014, fibromyalgia, midl bronchiectasis, hx lung CA 2013 NSCLC stage 3b, smoiking, PVD, and HTN who presented with 1 day rectal bleeding, diarrhea and abdominal cramping.  Symptoms reportedly started after a meal at a wedding.  In the ER, tachycardic, afebrile.  Hgb stable.  CT angiogram of the abdomen showed some focal colitis.   The patient was observed overnight, she had improvement in her cramps, was able to tolerate an oral diet.  She had only 1 small bowel movement, mixed with blood in the morning.  Hgb remained stable overnight.  Evaluated by gastroenterology, [name], who felt that the pattern CAT scan was most consistent with ischemic colitis.  Given her symptoms had resolved and she was observed to tolerate solid diet, she was given anticipatory guidance of a few more days of residual bleeding possible, perhaps mild pain, but that she was safe to resume bland diet and push fluids and discharge home with GI follow up.  May resume Xarelto in 1-2 days if bleeding stopped.         Pain control - [employer] Controlled Substance Reporting System database was reviewed.     Consultants: Gastroenterology, [name] Procedures performed: CT abdomen w/ angiogram Disposition: Home Diet recommendation:  Criss Rosales  DISCHARGE MEDICATION: Allergies as of 02/05/2022       Reactions   Ciprofloxacin Other (See Comments)   Diffuse polyarthropathy, joint effusions, pain across UE and LE with Cipro 750 mg. Fluoroquinolone class avoidance recommended.    Promethazine Other (See Comments)   Patient states was during a hospitalization was taken off but unsure of the reason   Polymyxin B Other (See Comments)   Eye irritation   Prednisone    Did NOT tolerate well   Trimethoprim Other (See Comments)   Eye irritation    Zofran [ondansetron Hcl]    Patient states she should not take Zofran due to her heart.   Morphine And Related Other (See Comments)   Hard time waking up when taking morphine, codeine, and related medications        Medication List     STOP taking these medications    sodium chloride HYPERTONIC 3 % nebulizer solution   Xarelto 20 MG Tabs tablet Generic drug: rivaroxaban       TAKE these medications    acetaminophen 500 MG tablet Commonly known as: TYLENOL Take 1,000 mg by mouth every 6 (six) hours as needed for mild pain.   albuterol (2.5 MG/3ML) 0.083% nebulizer solution Commonly known as: PROVENTIL Take 3 mLs (2.5 mg total) by nebulization every 4 (four) hours as needed for wheezing or shortness of breath.   albuterol 108 (90 Base) MCG/ACT inhaler Commonly known as: VENTOLIN HFA Inhale 2 puffs into the lungs  every 4 (four) hours as needed for wheezing or shortness of breath.   budesonide-formoterol 160-4.5 MCG/ACT inhaler Commonly known as: Symbicort Inhale 2 puffs into the lungs 2 (two) times daily.   cyanocobalamin 1000 MCG/ML injection Commonly known as: (VITAMIN B-12) Inject 1,000 mcg into the muscle every 14 (fourteen) days.        Follow-up Information     Lydia Shipper, MD Follow up.   Specialty: Gastroenterology Why: Or [name], as discussed with the team, within 4-6 weeks Contact information: [address] [place]  [address] [phone]                Discharge Instructions     Discharge instructions   Complete by: As directed    From Dr. Loleta [name]: You were admitted with ischemic colitis. You likely will have some cramping, and possibly some residual blood for a few more days.  Drink plenty of fluids. For pain, take acetaminophen [phone] mg up to three times a day   TEMPORARILY hold your Xarelto If you have a bowel movement without blood, you can resume, in the next 1-2 day  It would be prudent to call your primary care doctor and have a follow up appointment in 7-10 days and have them check your blood level (Hemoglobin) again  Certainly do follow up with a gastroenterologist, either [name] or [name] (the latter saw you in the hospital)   Increase activity slowly   Complete by: As directed        Discharge Exam: Filed Weights   02/04/22 1116  Weight: 62.6 kg   General: Pt is alert, awake, not in acute distress Cardiovascular: RRR, nl S1-S2, no murmurs appreciated.   No LE edema.   Respiratory: Normal respiratory rate and rhythm.  CTAB without rales or wheezes. Abdominal: Abdomen soft and non-tender.  No distension or HSM.   Neuro/Psych: Strength symmetric in upper and lower extremities.  Judgment and insight appear normal.   Condition at discharge: good  The results of significant diagnostics from this hospitalization (including imaging, microbiology, ancillary and laboratory) are listed below for reference.   Imaging Studies: CT ANGIO GI BLEED  Result Date: 02/04/2022 CLINICAL DATA:  Rectal bleeding and diarrhea for 2 days, remote history of lung cancer EXAM: CTA ABDOMEN AND PELVIS WITHOUT AND WITH CONTRAST TECHNIQUE: Multidetector CT imaging of the abdomen and pelvis was performed using the standard protocol during bolus administration of intravenous contrast. Multiplanar reconstructed images and MIPs were obtained and reviewed to evaluate the vascular anatomy.  RADIATION DOSE REDUCTION: This exam was performed according to the departmental dose-optimization program which includes automated exposure control, adjustment of the mA and/or kV according to patient size and/or use of iterative reconstruction technique. CONTRAST:  139mL OMNIPAQUE IOHEXOL 350 MG/ML SOLN COMPARISON:  CT chest, 04/11/2021 FINDINGS: VASCULAR Normal contour and caliber of the abdominal aorta. Moderate mixed calcific atherosclerosis. No evidence of aneurysm, dissection, or other acute aortic pathology. Standard branching pattern of the abdominal aorta with solitary bilateral renal arteries. Review of the MIP images confirms the above findings. NON-VASCULAR Hepatobiliary: No focal liver abnormality is seen. Status post cholecystectomy. No biliary dilatation. Pancreas: Unremarkable. No pancreatic ductal dilatation or surrounding inflammatory changes. Spleen: Normal in size without significant abnormality. Adrenals/Urinary Tract: Adrenal glands are unremarkable. Bilateral parapelvic cysts. Kidneys are otherwise normal, without renal calculi, solid lesion, or hydronephrosis. Bladder is unremarkable. Stomach/Bowel: Stomach is within normal limits. Appendix is not clearly visualized. Inflammatory wall thickening involving a segment of  the distal transverse and descending colon, centered about the splenic flexure (series 12, image 22). Occasional sigmoid diverticula. Lymphatic: No enlarged abdominal or pelvic lymph nodes. Reproductive: No mass or other significant abnormality. Other: No abdominal wall hernia or abnormality. No ascites. Musculoskeletal: No acute osseous findings. IMPRESSION: 1. Inflammatory wall thickening involving a segment of the distal transverse and descending colon, centered about the splenic flexure, consistent with nonspecific infectious, inflammatory, or ischemic colitis. 2. No intraluminal contrast extravasation to precisely localize source of GI bleeding 3. Normal contour and caliber  of the abdominal aorta. No evidence of aneurysm, dissection, or other acute aortic pathology. Moderate mixed calcific atherosclerosis. Electronically Signed   By: Delanna Ahmadi M.D.   On: 02/04/2022 12:53    Microbiology: Results for orders placed or performed during the hospital encounter of 02/04/22  Resp Panel by RT-PCR (Flu A&B, Covid) Nasopharyngeal Swab     Status: None   Collection Time: 02/04/22  1:39 PM   Specimen: Nasopharyngeal Swab; Nasopharyngeal(NP) swabs in vial transport medium  Result Value Ref Range Status   SARS Coronavirus 2 by RT PCR NEGATIVE NEGATIVE Final    Comment: (NOTE) SARS-CoV-2 target nucleic acids are NOT DETECTED.  The SARS-CoV-2 RNA is generally detectable in upper respiratory specimens during the acute phase of infection. The lowest concentration of SARS-CoV-2 viral copies this assay can detect is 138 copies/mL. A negative result does not preclude SARS-Cov-2 infection and should not be used as the sole basis for treatment or other patient management decisions. A negative result may occur with  improper specimen collection/handling, submission of specimen other than nasopharyngeal swab, presence of viral mutation(s) within the areas targeted by this assay, and inadequate number of viral copies(<138 copies/mL). A negative result must be combined with clinical observations, patient history, and epidemiological information. The expected result is Negative.  Fact Sheet for Patients:  EntrepreneurPulse.com.au  Fact Sheet for Healthcare Providers:  IncredibleEmployment.be  This test is no t yet approved or cleared by the [place] FDA and  has been authorized for detection and/or diagnosis of SARS-CoV-2 by FDA under an Emergency Use Authorization (EUA). This EUA will remain  in effect (meaning this test can be used) for the duration of the COVID-19 declaration under Section 564(b)(1) of the Act, 21 U.S.C.section  360bbb-3(b)(1), unless the authorization is terminated  or revoked sooner.       Influenza A by PCR NEGATIVE NEGATIVE Final   Influenza B by PCR NEGATIVE NEGATIVE Final    Comment: (NOTE) The Xpert Xpress SARS-CoV-2/FLU/RSV plus assay is intended as an aid in the diagnosis of influenza from Nasopharyngeal swab specimens and should not be used as a sole basis for treatment. Nasal washings and aspirates are unacceptable for Xpert Xpress SARS-CoV-2/FLU/RSV testing.  Fact Sheet for Patients: EntrepreneurPulse.com.au  Fact Sheet for Healthcare Providers: IncredibleEmployment.be  This test is not yet approved or cleared by the [place] FDA and has been authorized for detection and/or diagnosis of SARS-CoV-2 by FDA under an Emergency Use Authorization (EUA). This EUA will remain in effect (meaning this test can be used) for the duration of the COVID-19 declaration under Section 564(b)(1) of the Act, 21 U.S.C. section 360bbb-3(b)(1), unless the authorization is terminated or revoked.  Performed at [facility], [address], [place], [place] [address]     Labs: CBC: Recent Labs  Lab 02/04/22 1125 02/04/22 1648 02/04/22 2150 02/05/22 0419  WBC 8.2  --   --  5.1  NEUTROABS 6.3  --   --   --  HGB 15.9* 14.8 13.9 13.1  HCT 46.3* 43.8 39.4 38.9  MCV 87.4  --   --  90.3  PLT 256  --   --  701   Basic Metabolic Panel: Recent Labs  Lab 02/04/22 1125 02/04/22 1648  NA 141  --   K 3.7  --   CL 104  --   CO2 29  --   GLUCOSE 100*  --   BUN 16  --   CREATININE 0.96  --   CALCIUM 10.3  --   MG  --  2.3  PHOS  --  3.0   Liver Function Tests: Recent Labs  Lab 02/04/22 1125  AST 19  ALT 16  ALKPHOS 46  BILITOT 0.9  PROT 7.5  ALBUMIN 4.5   CBG: No results for input(s): GLUCAP in the last 168 hours.  Discharge time spent: 20 minutes.  Signed: Edwin Dada, MD [facility] 02/05/2022

## 2022-02-05 NOTE — Hospital Course (Addendum)
[name] is a 76 y.o. F with PMR, DVT/PE on Xarelto last in 2014, fibromyalgia, midl bronchiectasis, hx lung CA 2013 NSCLC stage 3b, smoiking, PVD, and HTN who presented with 1 day rectal bleeding, diarrhea and abdominal cramping.  Symptoms reportedly started after a meal at a wedding. ? ?In the ER, tachycardic, afebrile.  Hgb stable.  CT angiogram of the abdomen showed some focal colitis.  ?

## 2022-02-05 NOTE — Care Plan (Signed)
No BMs or bleeding overnight.  Hgb stable.  CTA showed colitis in setting of history of ?food poisoning and clinical colitis. ?

## 2022-02-05 NOTE — Plan of Care (Signed)
PIV removed. Reviewed DC paperwork with pt. Pt and spouse gathered belongings. Pt ambulated with tech to lobby. ? ? ?Problem: Education: ?Goal: Knowledge of General Education information will improve ?Description: Including pain rating scale, medication(s)/side effects and non-pharmacologic comfort measures ?Outcome: Completed/Met ?  ?Problem: Health Behavior/Discharge Planning: ?Goal: Ability to manage health-related needs will improve ?Outcome: Completed/Met ?  ?Problem: Clinical Measurements: ?Goal: Ability to maintain clinical measurements within normal limits will improve ?Outcome: Completed/Met ?Goal: Will remain free from infection ?Outcome: Completed/Met ?Goal: Diagnostic test results will improve ?Outcome: Completed/Met ?Goal: Respiratory complications will improve ?Outcome: Completed/Met ?  ?Problem: Activity: ?Goal: Risk for activity intolerance will decrease ?Outcome: Completed/Met ?  ?Problem: Coping: ?Goal: Level of anxiety will decrease ?Outcome: Completed/Met ?  ?Problem: Elimination: ?Goal: Will not experience complications related to bowel motility ?Outcome: Completed/Met ?Goal: Will not experience complications related to urinary retention ?Outcome: Completed/Met ?  ?Problem: Safety: ?Goal: Ability to remain free from injury will improve ?Outcome: Completed/Met ?  ?Problem: Skin Integrity: ?Goal: Risk for impaired skin integrity will decrease ?Outcome: Completed/Met ?  ?

## 2022-02-06 ENCOUNTER — Telehealth: Payer: Self-pay

## 2022-02-06 NOTE — Telephone Encounter (Signed)
Transition Care Management Follow-up Telephone Call ?Date of discharge and from where: [place] 02-05-22 Dx: acute ischemic colitis ?How have you been since you were released from the hospital? Feeling ok  ?Any questions or concerns? No ? ?Items Reviewed: ?Did the pt receive and understand the discharge instructions provided? Yes  ?Medications obtained and verified? Yes  ?Other? No  ?Any new allergies since your discharge? No  ?Dietary orders reviewed? Yes ?Do you have support at home? Yes  ? ?Home Care and Equipment/Supplies: ?Were home health services ordered? no ?If so, what is the name of the agency? na  ?Has the agency set up a time to come to the patient's home? no ?Were any new equipment or medical supplies ordered?  No ?What is the name of the medical supply agency? na ?Were you able to get the supplies/equipment? no ?Do you have any questions related to the use of the equipment or supplies? No ? ?Functional Questionnaire: (I = Independent and D = Dependent) ?ADLs: I ? ?Bathing/Dressing- I ? ?Meal Prep- I ? ?Eating- I ? ?Maintaining continence- I ? ?Transferring/Ambulation- I ? ?Managing Meds- I ? ?Follow up appointments reviewed: ? ?PCP Hospital f/u appt confirmed? Yes  Scheduled to see Dr Sarajane Jews  on 02-14-22 @ 1pm. ?[facility] f/u appt confirmed? No  pt will call to schedule . ?Are transportation arrangements needed? No  ?If their condition worsens, is the pt aware to call PCP or go to the Emergency Dept.? Yes ?Was the patient provided with contact information for the PCP's office or ED? Yes ?Was to pt encouraged to call back with questions or concerns? Yes  ?

## 2022-02-14 ENCOUNTER — Encounter: Payer: Self-pay | Admitting: Family Medicine

## 2022-02-14 ENCOUNTER — Ambulatory Visit (INDEPENDENT_AMBULATORY_CARE_PROVIDER_SITE_OTHER): Payer: Medicare HMO | Admitting: Family Medicine

## 2022-02-14 VITALS — BP 102/70 | Temp 98.6°F | Wt 144.0 lb

## 2022-02-14 DIAGNOSIS — K625 Hemorrhage of anus and rectum: Secondary | ICD-10-CM | POA: Diagnosis not present

## 2022-02-14 DIAGNOSIS — K529 Noninfective gastroenteritis and colitis, unspecified: Secondary | ICD-10-CM | POA: Diagnosis not present

## 2022-02-14 DIAGNOSIS — D649 Anemia, unspecified: Secondary | ICD-10-CM

## 2022-02-14 DIAGNOSIS — E538 Deficiency of other specified B group vitamins: Secondary | ICD-10-CM | POA: Diagnosis not present

## 2022-02-14 DIAGNOSIS — Z0000 Encounter for general adult medical examination without abnormal findings: Secondary | ICD-10-CM

## 2022-02-14 LAB — CBC WITH DIFFERENTIAL/PLATELET
Basophils Relative: 0 % (ref 0.0–3.0)
Eosinophils Relative: 3 % (ref 0.0–5.0)
HCT: 43.7 % (ref 36.0–46.0)
Hemoglobin: 14.9 g/dL (ref 12.0–15.0)
Lymphocytes Relative: 29 % (ref 12.0–46.0)
MCHC: 34.2 g/dL (ref 30.0–36.0)
MCV: 88.1 fl (ref 78.0–100.0)
Monocytes Relative: 2 % — ABNORMAL LOW (ref 3.0–12.0)
Neutrophils Relative %: 66 % (ref 43.0–77.0)
Platelets: 284 10*3/uL (ref 150.0–400.0)
RBC: 4.96 Mil/uL (ref 3.87–5.11)
RDW: 13.8 % (ref 11.5–15.5)
WBC: 6.7 10*3/uL (ref 4.0–10.5)

## 2022-02-14 MED ORDER — CYANOCOBALAMIN 1000 MCG/ML IJ SOLN
1000.0000 ug | Freq: Once | INTRAMUSCULAR | Status: AC
  Administered 2022-02-14: 1000 ug via INTRAMUSCULAR

## 2022-02-14 NOTE — Progress Notes (Addendum)
? ?  Subjective:  ? ? Patient ID: [name], female    DOB: Apr 19, 1946, 75 y.o.   MRN: [id number] ? ?HPI ?Here for a transitional care visit to follow up a hospital stay from 02-04-22 to 02-05-22 and for other issues. They day of her admission she was having abdominal pains, diarrhea, and rectal bleeding not associated with stools. No fever. A CT angiogram revealed an area of colitis involving the descending colon that was not diverticulitis. This was felt to be an ischemic colitis. The bleeding slowed down considerably, and the pains resolved. Her Hgb was stable going from 13.9 to 13.1 at DC. After 2 days at home, the bleeding completely stopped. She now feels fine and is eating a normal diet. Also she is past due for a colonoscopy, and she requests to see Dr. [name] for this. Finally she asks if she could have a thallasemia. Numerous family members including her mother and her 2 sisters have this. She already sees a Hematologist at [facility] for her coagulopathy.  ? ? ?Review of Systems  ?Constitutional: Negative.   ?Respiratory: Negative.    ?Cardiovascular: Negative.   ?Gastrointestinal: Negative.   ? ?   ?Objective:  ? Physical Exam ?Constitutional:   ?   Appearance: Normal appearance.  ?Cardiovascular:  ?   Rate and Rhythm: Normal rate and regular rhythm.  ?   Pulses: Normal pulses.  ?   Heart sounds: Normal heart sounds.  ?Pulmonary:  ?   Effort: Pulmonary effort is normal.  ?   Breath sounds: Normal breath sounds.  ?Abdominal:  ?   General: Abdomen is flat. Bowel sounds are normal. There is no distension.  ?   Palpations: Abdomen is soft. There is no mass.  ?   Tenderness: There is no abdominal tenderness. There is no guarding or rebound.  ?   Hernia: No hernia is present.  ?Neurological:  ?   Mental Status: She is alert.  ? ? ? ? ? ?   ?Assessment & Plan:  ?She is recovering from an ischemic colitis, and we wil check another Hgb today. We will refer her to Dr. Collene Mares for a colonoscopy. As  for the family hx of thallasemia, she will ask her Hematologist about testing her for this.We spent a total of ( 33  ) minutes reviewing records and discussing these issues.  ?Alysia Penna, MD ? ? ?

## 2022-02-14 NOTE — Addendum Note (Signed)
Addended by: Alysia Penna A on: 02/14/2022 02:30 PM ? ? Modules accepted: Level of Service ? ?

## 2022-02-14 NOTE — Addendum Note (Signed)
Addended by: Wyvonne Lenz on: 02/14/2022 04:57 PM ? ? Modules accepted: Orders ? ?

## 2022-02-15 ENCOUNTER — Ambulatory Visit: Payer: Medicare HMO

## 2022-03-01 ENCOUNTER — Ambulatory Visit: Payer: Medicare HMO

## 2022-03-06 ENCOUNTER — Telehealth: Payer: Self-pay | Admitting: Family Medicine

## 2022-03-06 ENCOUNTER — Ambulatory Visit (INDEPENDENT_AMBULATORY_CARE_PROVIDER_SITE_OTHER): Payer: Medicare HMO | Admitting: *Deleted

## 2022-03-06 DIAGNOSIS — E538 Deficiency of other specified B group vitamins: Secondary | ICD-10-CM

## 2022-03-06 MED ORDER — CYANOCOBALAMIN 1000 MCG/ML IJ SOLN
1000.0000 ug | Freq: Once | INTRAMUSCULAR | Status: AC
  Administered 2022-03-06: 1000 ug via INTRAMUSCULAR

## 2022-03-06 MED ORDER — CYANOCOBALAMIN 1000 MCG/ML IJ SOLN: 1000.0000 ug | Freq: Once | INTRAMUSCULAR | 0 refills | Status: AC

## 2022-03-06 NOTE — Telephone Encounter (Signed)
Patient is requesting a call back.  Dr. Sarajane Jews placed a referral for her, however it got scheduled for the wrong dr.  ?

## 2022-03-06 NOTE — Progress Notes (Signed)
Per orders of Dr. Fry, injection of Cyanocobalamin 1000mcg given by Conlee Sliter A. Patient tolerated injection well.  

## 2022-03-06 NOTE — Telephone Encounter (Signed)
Called pt no way to leave a message  will try later ?

## 2022-03-07 NOTE — Telephone Encounter (Signed)
Poke with pt states that she is still waiting for [facility] to call her for scheduling of her colonoscopy and endoscopy, Pt states that she received a call from [facility] but pt does not want to go back there, sent a teams message to referral coordinator covering the clinic today [facility] to see what the hold up is on pt referral ?

## 2022-03-09 NOTE — Telephone Encounter (Signed)
Send a message to Referral coordinator Paula Libra to help out with changing referral to [facility] ?

## 2022-03-13 NOTE — Telephone Encounter (Signed)
[name] our office referral coordinator spoke with Dr Collene Mares office the staff stated that Dr Collene Mares is not accepting new pt, pt notified about this state that she will think about it and will call the office with update ?

## 2022-03-14 ENCOUNTER — Encounter: Payer: Self-pay | Admitting: Gastroenterology

## 2022-03-14 ENCOUNTER — Telehealth: Payer: Self-pay | Admitting: Gastroenterology

## 2022-03-14 NOTE — Telephone Encounter (Signed)
Good Morning Dr. Tarri Glenn, ? ? ?Patient called and was requesting to make an appointment for a egd and colonoscopy. After looking at her chart she had both procedures done in 2011 by Dr.Kaplan. Patient was last seen 5 years ago by [facility] for dysphagia. Patient has a recall, in the system with you for OV from 2020. Patients records from [facility] are in [place], will you please review and advise on scheduling patient? ? ? ?Thank you.    ?

## 2022-03-14 NOTE — Telephone Encounter (Signed)
Patient was scheduled for 5/12 at 3:00  ?

## 2022-03-20 ENCOUNTER — Ambulatory Visit (INDEPENDENT_AMBULATORY_CARE_PROVIDER_SITE_OTHER): Payer: Medicare HMO

## 2022-03-20 DIAGNOSIS — E538 Deficiency of other specified B group vitamins: Secondary | ICD-10-CM | POA: Diagnosis not present

## 2022-03-20 MED ORDER — CYANOCOBALAMIN 1000 MCG/ML IJ SOLN
1000.0000 ug | Freq: Once | INTRAMUSCULAR | Status: AC
  Administered 2022-03-20: 1000 ug via INTRAMUSCULAR

## 2022-03-20 NOTE — Progress Notes (Signed)
Pt here for biweekly B12 injection per Dr. Sarajane Jews. ? ?B12 1067mcg given IM left deltoid and pt tolerated injection well. ? ?Next B12 injection scheduled for 04/03/22. ? ?

## 2022-04-03 ENCOUNTER — Ambulatory Visit (INDEPENDENT_AMBULATORY_CARE_PROVIDER_SITE_OTHER): Payer: Medicare HMO | Admitting: *Deleted

## 2022-04-03 ENCOUNTER — Telehealth: Payer: Self-pay | Admitting: *Deleted

## 2022-04-03 DIAGNOSIS — E538 Deficiency of other specified B group vitamins: Secondary | ICD-10-CM

## 2022-04-03 MED ORDER — CYANOCOBALAMIN 1000 MCG/ML IJ SOLN: 1000.0000 ug | Freq: Once | INTRAMUSCULAR | 0 refills | Status: AC

## 2022-04-03 MED ORDER — CYANOCOBALAMIN 1000 MCG/ML IJ SOLN
1000.0000 ug | Freq: Once | INTRAMUSCULAR | Status: AC
  Administered 2022-04-03: 1000 ug via INTRAMUSCULAR

## 2022-04-03 NOTE — Telephone Encounter (Signed)
Left a detailed message on the voicemail at [facility] to cancel the Rx I sent in error for Cyanocobalamin as the patient was here as a nurse's visit for the injection. ?

## 2022-04-03 NOTE — Progress Notes (Signed)
Per orders of Dr. Fry, injection of Cyanocobalamin 1000mcg given by Wendell Nicoson A. Patient tolerated injection well.  

## 2022-04-06 ENCOUNTER — Encounter: Payer: Self-pay | Admitting: Gastroenterology

## 2022-04-06 ENCOUNTER — Ambulatory Visit: Payer: Medicare HMO | Admitting: Gastroenterology

## 2022-04-06 VITALS — BP 130/70 | HR 88 | Ht 65.0 in | Wt 144.0 lb

## 2022-04-06 DIAGNOSIS — R194 Change in bowel habit: Secondary | ICD-10-CM

## 2022-04-06 DIAGNOSIS — K625 Hemorrhage of anus and rectum: Secondary | ICD-10-CM

## 2022-04-06 DIAGNOSIS — Z7901 Long term (current) use of anticoagulants: Secondary | ICD-10-CM

## 2022-04-06 NOTE — Progress Notes (Signed)
? ?Referring Provider: Laurey Morale, MD ?Primary Care Physician:  Laurey Morale, MD ? ? ?Chief complaint:  Hospital follow-up ? ? ?IMPRESSION:  ?Painless rectal bleeding ?Change in bowel habits following her hospitalization in March now with fecal incontinence ?Lifetime history of chronic constipation ?Recent hospitalization for abdominal pain and bleeding thought to be due to ischemic colitis ?Chronic Xarelto for history of DVT and PE ? ?The differential for rectal bleeding is broad.  Her recent presentation is likely ischemic colitis. But, her bowel habits never returned to normal. Must consider inflammatory colitis, microscopic colitis, and SIBO in the differential as well as polyps, mass, and ulcers.  Given this differential I am recommending a colonoscopy. ? ?Will proceed with endoscopy. Given advanced age, comorbidities, and anticoagulants, the procedure is high risk. The nature of the procedure, as well as the risks, benefits, and alternatives were carefully and thoroughly reviewed with the patient. I have recommended holding Xarelto before endoscopy.  I discussed with the patient that there is a low, but real, risk of a cardiovascular event such as heart attack, stroke, or recurrent embolism/thrombosis while off Xarelto.  She is very concerned given her history.  Will communicate with patient's prescribing provider to confirm that holding the Xarelto is appropriate at this time. Ample time for discussion and questions allowed. The patient understood, was satisfied, and agreed to proceed. ? ?PLAN: ?Colonoscopy after a Xarelto washout (Dr. Sarajane Jews prescribes her Xarelto) ? ? ?HPI: [name] is a 76 y.o. female referred by Dr. Sarajane Jews for evaluation of rectal bleeding.  This is my first visit with [name].  She was seen by Dr. Henrene Pastor during her recent hospitalization.  The history is obtained through the patient review of her electronic health record. 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  ? ?Lifetime history of constipation with associated bloating and abdominal cramping. Often going a week between bowel movements. Symptoms had been stable for many years.   ? ?Colonoscopy with Dr. Deatra Ina was normal in 2011 ? ?She was seen at [facility] in 2015 for evaluation of dysphagia. EGD was normal except for 1 cm HH. Esophageal bx showed reflux no EOE.  ? ?Seen in the ER 02/04/2022 with painless rectal bleeding.  CT angiogram showed inflammatory wall thickening involving a segment of the distal transverse and descending colon centered around the splenic flexure consistent with infectious, inflammatory, or ischemic colitis. ? ?During that hospitalization she saw Dr. Henrene Pastor.  He noted chronic constipation presenting with acute abdominal pain followed by rectal bleeding.  He thought her presentation was most consistent with acute ischemic colitis.  He advanced her diet.  He recommended MiraLAX for chronic constipation. ? ?She returns today in hospital follow-up.  Her bowel habits have not returned to normal since her hospitalization in March.  Having mushy, explosive bowel movements. Frequent passage of stool even with urination and fecal soiling. No nocturnal accidents. No blood or mucous. She feels an abnormality on her right rectum.  ? ?Paternal aunt at age 67s and Maternal uncle at age 54s with colon cancer. There is no other known family history of colon cancer or polyps. No family history of stomach cancer or other GI malignancy. No family history of inflammatory bowel disease or celiac. Strong family  history of lung cancer. Father with non Hodgkins lymphoma.  ? ? ?Past Medical History:  ?Diagnosis Date  ?  Anemia   ? Cancer [facility])   ? Cataract   ? Clotting disorder ([place])   ? Community acquired pneumonia 01/22/2013  ? DVT (deep venous thrombosis) ([place])   ? had 2 in right calf & 1 in the left calf  ? Dyspnea   ? nothing new, hx lung cancer  ? Fibromyalgia   ? Hyperlipidemia   ? Hypertension   ? no current problems no meds since cancer tx  ? Peripheral vascular disease ([place])   ? PMR (polymyalgia rheumatica) ([facility]) 10/31/2017  ? PONV (postoperative nausea and vomiting)   ? slow to wake up from anesthesia  ? Squamous cell lung cancer ([place]) 11/27/2011  ? sees Dr. Elmon Kirschner at [facility] and Dr. Jaye Beagle at [facility]   ? Tobacco abuse   ? quit smoking in 2013  ? ? ?Past Surgical History:  ?Procedure Laterality Date  ? APPENDECTOMY    ? CHOLECYSTECTOMY    ? COLONOSCOPY    ? 11/28/09 repeat in 10 years  ? ESOPHAGOGASTRODUODENOSCOPY  October 2013  ? severe esophagitis, done at [facility]   ? KNEE ARTHROSCOPY    ? right  ? LAMINECTOMY    ? lumbar  ? PORTACATH PLACEMENT    ? RADIOLOGY WITH ANESTHESIA N/A 01/02/2022  ? Procedure: MRI LUMBAR WITH AND WITHOUT WITH ANESTHESIA;  Surgeon: Radiologist, Medication, MD;  Location: [facility];  Service: Radiology;  Laterality: N/A;  ? TONSILLECTOMY    ? VIDEO BRONCHOSCOPY  05/30/2012  ? Procedure: VIDEO BRONCHOSCOPY WITH FLUORO;  Surgeon: Elsie Stain, MD;  Location: [facility];  Service: Cardiopulmonary;  Laterality: N/A;  ? ? ?Current Outpatient Medications  ?Medication Sig Dispense Refill  ? acetaminophen (TYLENOL) 500 MG tablet Take 1,000 mg by mouth every 6 (six) hours as needed for mild pain.    ? albuterol (PROVENTIL) (2.5 MG/3ML) 0.083% nebulizer solution Take 3 mLs (2.5 mg total) by nebulization every 4 (four) hours as needed for wheezing or shortness of breath. 75 mL 12  ? albuterol (VENTOLIN HFA) 108 (90 Base) MCG/ACT inhaler Inhale 2 puffs into the lungs every 4 (four) hours as needed for wheezing or shortness of breath. 18 g 11  ? budesonide-formoterol (SYMBICORT)  160-4.5 MCG/ACT inhaler Inhale 2 puffs into the lungs 2 (two) times daily. 1 each 12  ? cyanocobalamin (,VITAMIN B-12,) 1000 MCG/ML injection Inject 1,000 mcg into the muscle every 14 (fourteen) days.    ? rivaroxaban (XARELTO) 20 MG TABS tablet Take 20 mg by mouth daily with supper.    ? ?No current facility-administered medications for this visit.  ? ? ?Allergies as of 04/06/2022 - Review Complete 04/06/2022  ?Allergen Reaction Noted  ? Ciprofloxacin Other (See Comments) 08/09/2018  ? Promethazine Other (See Comments) 03/20/2013  ? Polymyxin b Other (See Comments) 11/29/2020  ? Prednisone  11/11/2017  ? Trimethoprim Other (See Comments) 11/29/2020  ? Zofran [ondansetron hcl]  01/24/2013  ? Morphine and related Other (See Comments) 05/07/2015  ? ? ?Family History  ?Problem Relation Age of Onset  ? Hypertension Mother   ?     family history  ? Atrial fibrillation Mother   ? Cancer Mother   ?     Lung  ? Cancer Father   ?     NHL  ? Multiple sclerosis Sister   ? Anemia Other   ?  FE deficiency   ? Arthritis Other   ?     family history  ? Cancer Other   ?     colon 1st degree relative <60  ? ? ?Social History  ? ?Socioeconomic History  ? Marital status: Married  ?  Spouse name: Gwenlyn Perking  ? Number of children: 3  ? Years of education: Not on file  ? Highest education level: Not on file  ?Occupational History  ? Occupation: Retired Surveyor, minerals.  ?  Employer: UNEMPLOYED  ?Tobacco Use  ? Smoking status: Former  ?  Types: Cigarettes  ?  Quit date: 05/23/2012  ?  Years since quitting: 9.8  ? Smokeless tobacco: Never  ? Tobacco comments:  ?  Smoking since age 28 yrs old, quit 05/23/12  ?Vaping Use  ? Vaping Use: Never used  ?Substance and Sexual Activity  ? Alcohol use: Yes  ?  Alcohol/week: 4.0 standard drinks  ?  Types: 4 Glasses of wine per week  ? Drug use: No  ? Sexual activity: Not Currently  ?  Birth control/protection: Post-menopausal  ?Other Topics Concern  ? Not on file  ?Social History Narrative  ? ** Merged History  Encounter ** Married.  Lives with husband in [place].  Independent of ADLs.  ?   ? Right Handed   ? Lives in a one level home   ? ?Social Determinants of Health  ? ?Financial Resource Strain: Not on file  ?Fo

## 2022-04-06 NOTE — Patient Instructions (Addendum)
It was my pleasure to provide care to you today. Based on our discussion, I am providing you with my recommendations below: ? ?RECOMMENDATION(S):  ? ?Will call to schedule colonoscopy once we received clearance from Dr. Sarajane Jews. ? ?FOLLOW UP: ? ?After your procedure, you will receive a call from my office staff regarding my recommendation for follow up. ? ?BMI: ? ?If you are age 77 or older, your body mass index should be between 23-30. Your Body mass index is 23.96 kg/m?Marland Kitchen If this is out of the aforementioned range listed, please consider follow up with your Primary Care Provider. ? ?MY CHART: ? ?The [facility] providers would like to encourage you to use [facility] to communicate with providers for non-urgent requests or questions.  Due to long hold times on the telephone, sending your provider a message by [facility] may be a faster and more efficient way to get a response.  Please allow 48 business hours for a response.  Please remember that this is for non-urgent requests.  ? ?Thank you for trusting me with your gastrointestinal care!   ? ?[place], MD, MPH ? ?

## 2022-04-17 ENCOUNTER — Telehealth: Payer: Self-pay | Admitting: Family Medicine

## 2022-04-17 ENCOUNTER — Ambulatory Visit (INDEPENDENT_AMBULATORY_CARE_PROVIDER_SITE_OTHER): Payer: Medicare HMO

## 2022-04-17 DIAGNOSIS — E538 Deficiency of other specified B group vitamins: Secondary | ICD-10-CM

## 2022-04-17 MED ORDER — CYANOCOBALAMIN 1000 MCG/ML IJ SOLN
1000.0000 ug | Freq: Once | INTRAMUSCULAR | Status: AC
  Administered 2022-04-17: 1000 ug via INTRAMUSCULAR

## 2022-04-17 NOTE — Telephone Encounter (Signed)
Pt was scheduled for a MyChart video visit with Dr Sarajane Jews on 10/19/2022

## 2022-04-17 NOTE — Progress Notes (Signed)
Pt here for monthly B12 injection per Dr Sarajane Jews  B12 1065mcg given IM on the Left Deltoid per pt request, and pt tolerated injection well.  Next B12 injection scheduled for ( pt will call to schedule)

## 2022-04-17 NOTE — Telephone Encounter (Signed)
Patient came in for a B-12 shot, but was also  inquiring about clearance from Dr. Sarajane Jews regarding a colonoscopy. Patient was redirected to Izora Gala, who scheduled patient for a virtual visit on 04/18/2022 at 3:30 pm.  *FYI  (IC)

## 2022-04-18 ENCOUNTER — Telehealth (INDEPENDENT_AMBULATORY_CARE_PROVIDER_SITE_OTHER): Payer: Medicare HMO | Admitting: Family Medicine

## 2022-04-18 ENCOUNTER — Encounter: Payer: Self-pay | Admitting: Family Medicine

## 2022-04-18 DIAGNOSIS — K529 Noninfective gastroenteritis and colitis, unspecified: Secondary | ICD-10-CM

## 2022-04-18 DIAGNOSIS — F418 Other specified anxiety disorders: Secondary | ICD-10-CM | POA: Diagnosis not present

## 2022-04-18 DIAGNOSIS — C3490 Malignant neoplasm of unspecified part of unspecified bronchus or lung: Secondary | ICD-10-CM | POA: Diagnosis not present

## 2022-04-18 DIAGNOSIS — Z86711 Personal history of pulmonary embolism: Secondary | ICD-10-CM | POA: Diagnosis not present

## 2022-04-18 HISTORY — DX: Other specified anxiety disorders: F41.8

## 2022-04-18 MED ORDER — SERTRALINE HCL 50 MG PO TABS: 50.0000 mg | ORAL_TABLET | Freq: Every day | ORAL | 2 refills | Status: DC

## 2022-04-18 MED ORDER — ENOXAPARIN SODIUM 60 MG/0.6ML IJ SOSY: 60.0000 mg | PREFILLED_SYRINGE | Freq: Two times a day (BID) | INTRAMUSCULAR | 0 refills | Status: DC

## 2022-04-18 NOTE — Progress Notes (Signed)
Subjective:    Patient ID: [name], female    DOB: August 03, 1946, 76 y.o.   MRN: [id number]  HPI Virtual Visit via Video Note  I connected with the patient on 04/18/22 at  3:30 PM EDT by a video enabled telemedicine application and verified that I am speaking with the correct person using two identifiers.  Location patient: home Location provider:work or home office Persons participating in the virtual visit: patient, provider  I discussed the limitations of evaluation and management by telemedicine and the availability of in person appointments. The patient expressed understanding and agreed to proceed.   HPI: Here for several issues. First she was hospitalized in March for GI bleeding and no source was found. Ischemic colitis was considered a possibility. Since going home she has continued to have intermittent bleeding from the rectum, though no more abdominal pains. She saw Dr. Laurier Nancy in GI on 04-06-22 and she wants to do upper and lower endoscopy to investigate this. [name] takes Xarelto for a hx of repeated DVT's and PE's. Dr. Tarri Glenn wants to hold the Xarelto for 5 days prior to the studies,but                                                                                                                                                                                                                       [name] is worried about the risk of a blood clot. She asks my opinion. Also she says she has had more depression and anxiety lately, and she wants to try another medication for this. She took Wellbutrin for awhile several years ago and had only mixed success.   ROS: See pertinent positives and negatives per HPI.  Past Medical History:  Diagnosis Date   Anemia    Cancer ([place])    Cataract    Clotting disorder ([place])    Community acquired pneumonia 01/22/2013   DVT (deep venous thrombosis) ([place])    had 2 in right calf & 1 in the left calf   Dyspnea    nothing new, hx  lung cancer   Fibromyalgia    Hyperlipidemia    Hypertension    no current problems no meds since cancer tx   Peripheral vascular disease (HCC)    PMR (polymyalgia rheumatica) ([place]) 10/31/2017   PONV (postoperative nausea and vomiting)    slow to wake up from anesthesia   Squamous cell lung cancer ([place]) 11/27/2011   sees Dr. Elmon Kirschner at [facility]  and Dr. Jaye Beagle at [place] abuse    quit smoking in 2013    Past Surgical History:  Procedure Laterality Date   APPENDECTOMY     CHOLECYSTECTOMY     COLONOSCOPY     11/28/09 repeat in 10 years   ESOPHAGOGASTRODUODENOSCOPY  October 2013   severe esophagitis, done at [place]     right   LAMINECTOMY     lumbar   PORTACATH PLACEMENT     RADIOLOGY WITH ANESTHESIA N/A 01/02/2022   Procedure: MRI LUMBAR WITH AND WITHOUT WITH ANESTHESIA;  Surgeon: Radiologist, Medication, MD;  Location: [place];  Service: Radiology;  Laterality: N/A;   TONSILLECTOMY     VIDEO BRONCHOSCOPY  05/30/2012   Procedure: VIDEO BRONCHOSCOPY WITH FLUORO;  Surgeon: Elsie Stain, MD;  Location: [place];  Service: Cardiopulmonary;  Laterality: N/A;    Family History  Problem Relation Age of Onset   Hypertension Mother        family history   Atrial fibrillation Mother    Cancer Mother        Lung   Cancer Father        NHL   Multiple sclerosis Sister    Anemia Other        FE deficiency    Arthritis Other        family history   Cancer Other        colon 1st degree relative <60     Current Outpatient Medications:    acetaminophen (TYLENOL) 500 MG tablet, Take 1,000 mg by mouth every 6 (six) hours as needed for mild pain., Disp: , Rfl:    albuterol (PROVENTIL) (2.5 MG/3ML) 0.083% nebulizer solution, Take 3 mLs (2.5 mg total) by nebulization every 4 (four) hours as needed for wheezing or shortness of breath., Disp: 75 mL, Rfl: 12   albuterol (VENTOLIN HFA) 108 (90 Base) MCG/ACT inhaler, Inhale 2 puffs  into the lungs every 4 (four) hours as needed for wheezing or shortness of breath., Disp: 18 g, Rfl: 11   budesonide-formoterol (SYMBICORT) 160-4.5 MCG/ACT inhaler, Inhale 2 puffs into the lungs 2 (two) times daily., Disp: 1 each, Rfl: 12   cyanocobalamin (,VITAMIN B-12,) 1000 MCG/ML injection, Inject 1,000 mcg into the muscle every 14 (fourteen) days., Disp: , Rfl:    enoxaparin (LOVENOX) 60 MG/0.6ML injection, Inject 0.6 mLs (60 mg total) into the skin every 12 (twelve) hours., Disp: 6 mL, Rfl: 0   rivaroxaban (XARELTO) 20 MG TABS tablet, Take 20 mg by mouth daily with supper., Disp: , Rfl:   EXAM:  VITALS per patient if applicable:  GENERAL: alert, oriented, appears well and in no acute distress  HEENT: atraumatic, conjunttiva clear, no obvious abnormalities on inspection of external nose and ears  NECK: normal movements of the head and neck  LUNGS: on inspection no signs of respiratory distress, breathing rate appears normal, no obvious gross SOB, gasping or wheezing  CV: no obvious cyanosis  MS: moves all visible extremities without noticeable abnormality  PSYCH/NEURO: pleasant and cooperative, no obvious depression or anxiety, speech and thought processing grossly intact  ASSESSMENT AND PLAN: For the GI bleeding I think she would do very well with a Lovenox bridge. We plan for her to stop taking Xarelto 5 days before the procedure, and for these 5 days she will give herself Lovenox shots BID. The last Lovenox shot will be given the night before the procedure. She will then  resume the Xarelto the day after the procedure. We will communicate this to Dr. Tarri Glenn. As for the depression and anxiety, we will start her on Zoloft 50 mg daily. Recheck in 4 weeks.  Alysia Penna, MD  Discussed the following assessment and plan:  Acute hemorrhagic colitis  Non-small cell lung cancer, unspecified laterality [facility])  Personal history of pulmonary embolism     I discussed the assessment and  treatment plan with the patient. The patient was provided an opportunity to ask questions and all were answered. The patient agreed with the plan and demonstrated an understanding of the instructions.   The patient was advised to call back or seek an in-person evaluation if the symptoms worsen or if the condition fails to improve as anticipated.      Review of Systems     Objective:   Physical Exam        Assessment & Plan:

## 2022-04-19 NOTE — Telephone Encounter (Signed)
Please disregard

## 2022-04-25 ENCOUNTER — Ambulatory Visit: Payer: Medicare HMO | Admitting: Family Medicine

## 2022-04-25 ENCOUNTER — Telehealth: Payer: Self-pay | Admitting: Family Medicine

## 2022-04-25 ENCOUNTER — Telehealth: Payer: Self-pay

## 2022-04-25 NOTE — Telephone Encounter (Signed)
Pt message sent  to Dr Sarajane Jews

## 2022-04-25 NOTE — Telephone Encounter (Signed)
Pt's husband came in to say Ms. [name] will not be coming in for her appt. because she is very ill. Pt would like a refill of the cough pearls sent to her pharmacy as soon as possible ([facility])

## 2022-04-26 MED ORDER — BENZONATATE 200 MG PO CAPS: 200.0000 mg | ORAL_CAPSULE | Freq: Three times a day (TID) | ORAL | 0 refills | Status: DC | PRN

## 2022-04-26 NOTE — Telephone Encounter (Signed)
Noted  

## 2022-04-26 NOTE — Telephone Encounter (Signed)
Spoke with pt advised that Rx for Benzonatate was sent to her pharmacy. Pt state that she wanted to see Dr Sarajane Jews since she was so sick to come to  her appointment earlier this week, Pt state that she has cough and congestion with body aches, advised to go to the  ED if her symptoms gets worse, pt requested for appointment with Dr Sarajane Jews for tomorrow, pt has been scheduled

## 2022-04-26 NOTE — Telephone Encounter (Signed)
I sent in more Benzonatate

## 2022-04-26 NOTE — Addendum Note (Signed)
Addended by: Alysia Penna A on: 04/26/2022 08:01 AM   Modules accepted: Orders

## 2022-04-27 ENCOUNTER — Encounter: Payer: Self-pay | Admitting: Family Medicine

## 2022-04-27 ENCOUNTER — Ambulatory Visit (INDEPENDENT_AMBULATORY_CARE_PROVIDER_SITE_OTHER): Payer: Medicare HMO | Admitting: Family Medicine

## 2022-04-27 VITALS — BP 106/74 | HR 87 | Temp 97.8°F | Wt 140.0 lb

## 2022-04-27 DIAGNOSIS — J029 Acute pharyngitis, unspecified: Secondary | ICD-10-CM | POA: Diagnosis not present

## 2022-04-27 DIAGNOSIS — R059 Cough, unspecified: Secondary | ICD-10-CM

## 2022-04-27 DIAGNOSIS — J40 Bronchitis, not specified as acute or chronic: Secondary | ICD-10-CM

## 2022-04-27 LAB — POCT INFLUENZA A/B
Influenza A, POC: NEGATIVE
Influenza B, POC: NEGATIVE

## 2022-04-27 LAB — POCT RAPID STREP A (OFFICE): Rapid Strep A Screen: NEGATIVE

## 2022-04-27 MED ORDER — ALBUTEROL SULFATE (2.5 MG/3ML) 0.083% IN NEBU: 2.5000 mg | INHALATION_SOLUTION | RESPIRATORY_TRACT | 12 refills | Status: DC | PRN

## 2022-04-27 MED ORDER — SODIUM CHLORIDE 3 % IN NEBU: INHALATION_SOLUTION | RESPIRATORY_TRACT | 12 refills | Status: DC | PRN

## 2022-04-27 MED ORDER — AMOXICILLIN-POT CLAVULANATE 875-125 MG PO TABS: 1.0000 | ORAL_TABLET | Freq: Two times a day (BID) | ORAL | 0 refills | Status: DC

## 2022-04-27 MED ORDER — ONDANSETRON HCL 8 MG PO TABS: 8.0000 mg | ORAL_TABLET | Freq: Four times a day (QID) | ORAL | 1 refills | Status: DC | PRN

## 2022-04-27 MED ORDER — BUDESONIDE-FORMOTEROL FUMARATE 160-4.5 MCG/ACT IN AERO
2.0000 | INHALATION_SPRAY | Freq: Two times a day (BID) | RESPIRATORY_TRACT | 12 refills | Status: DC
Start: 2022-04-27 — End: 2023-09-12

## 2022-04-27 NOTE — Progress Notes (Signed)
   Subjective:    Patient ID: [name], female    DOB: November 23, 1946, 76 y.o.   MRN: [id number]  HPI Here for 5 days of chills, dry coughing, ST, body aches, weakness, and nausea. She has vomited once. Drinking fluids and taking Benzonatate.    Review of Systems  Constitutional:  Positive for chills and fatigue. Negative for diaphoresis and fever.  HENT:  Positive for postnasal drip, sinus pressure and sore throat. Negative for congestion and ear pain.   Eyes: Negative.   Respiratory:  Positive for cough, shortness of breath and wheezing.       Objective:   Physical Exam Constitutional:      Comments: Appears ill, in a wheelchair   HENT:     Right Ear: Tympanic membrane, ear canal and external ear normal.     Left Ear: Tympanic membrane, ear canal and external ear normal.     Nose: Nose normal.     Mouth/Throat:     Pharynx: Oropharynx is clear.  Eyes:     Conjunctiva/sclera: Conjunctivae normal.  Pulmonary:     Effort: Pulmonary effort is normal.     Breath sounds: Wheezing and rhonchi present. No rales.  Lymphadenopathy:     Cervical: No cervical adenopathy.  Neurological:     Mental Status: She is alert.          Assessment & Plan:  Bronchitis, treat with 10 days of Augmentin. Use Zofran for nausea.  Alysia Penna, MD

## 2022-04-30 ENCOUNTER — Telehealth: Payer: Self-pay | Admitting: Family Medicine

## 2022-04-30 NOTE — Telephone Encounter (Signed)
Spoke with patient to schedule Medicare Annual Wellness Visit (AWV) either virtually or in office.   Pt wanted call back in couple weeks sick    awvi 01/25/11 per [place]  please schedule at anytime with [facility] Nurse Health Advisor 1 or 2   This should be a 45 minute visit.

## 2022-04-30 NOTE — Telephone Encounter (Signed)
Pt was dx with bronchitis on Friday 04-27-2022 and per pt was told to callback on Monday or Tuesday with update. Pt is still weak and has a cough. Please advise

## 2022-05-01 NOTE — Telephone Encounter (Signed)
Called spoke with patient, message given.

## 2022-05-01 NOTE — Telephone Encounter (Signed)
Finish out the antibiotic, and see [place] after that

## 2022-05-09 ENCOUNTER — Ambulatory Visit (INDEPENDENT_AMBULATORY_CARE_PROVIDER_SITE_OTHER): Payer: Medicare HMO | Admitting: Family Medicine

## 2022-05-09 ENCOUNTER — Encounter: Payer: Self-pay | Admitting: Family Medicine

## 2022-05-09 ENCOUNTER — Ambulatory Visit (INDEPENDENT_AMBULATORY_CARE_PROVIDER_SITE_OTHER): Payer: Medicare HMO

## 2022-05-09 VITALS — BP 98/72 | HR 96 | Temp 98.4°F | Wt 141.5 lb

## 2022-05-09 DIAGNOSIS — C3491 Malignant neoplasm of unspecified part of right bronchus or lung: Secondary | ICD-10-CM | POA: Diagnosis not present

## 2022-05-09 DIAGNOSIS — R053 Chronic cough: Secondary | ICD-10-CM

## 2022-05-09 DIAGNOSIS — E538 Deficiency of other specified B group vitamins: Secondary | ICD-10-CM

## 2022-05-09 MED ORDER — CYANOCOBALAMIN 1000 MCG/ML IJ SOLN
1000.0000 ug | Freq: Once | INTRAMUSCULAR | Status: AC
  Administered 2022-05-09: 1000 ug via INTRAMUSCULAR

## 2022-05-09 NOTE — Progress Notes (Signed)
   Subjective:    Patient ID: [name], female    DOB: 06/18/1946, 76 y.o.   MRN: [id number]  HPI Here to follow up on a chronic cough that she has had for several months. This is usually dry but it can produce clear sputum. No fever. No more SOB than usual. Her oxygen sats at home stay in the high 90s. We saw her on 04-27-22 and gave her a course of Augmentin. This has not helped very much. The last CXR we obtained here was in April of 2022. The last chest CT she had done at [facility] was last September. She is scheduled to see her oncologist, Dr. Elmon Kirschner, and to get another chest CT this September.    Review of Systems  Constitutional:  Positive for fatigue. Negative for fever.  Respiratory:  Positive for cough and shortness of breath. Negative for wheezing.   Cardiovascular: Negative.        Objective:   Physical Exam Constitutional:      Appearance: Normal appearance. She is not ill-appearing.  Cardiovascular:     Rate and Rhythm: Normal rate and regular rhythm.     Pulses: Normal pulses.     Heart sounds: Normal heart sounds.  Pulmonary:     Breath sounds: No wheezing or rales.     Comments: She has diffuse rhonchi and consolidation in the LUL.   Neurological:     Mental Status: She is alert.           Assessment & Plan:  She has a chronic cough with a HX of lung cancer. There is no evidence of an acute infection. She can use Benzonatate as needed. We will send her for a CXR today.  Alysia Penna, MD

## 2022-05-09 NOTE — Addendum Note (Signed)
Addended by: Wyvonne Lenz on: 05/09/2022 12:01 PM   Modules accepted: Orders

## 2022-05-11 NOTE — Progress Notes (Deleted)
No acute infection is seen

## 2022-05-21 ENCOUNTER — Telehealth: Payer: Self-pay | Admitting: Gastroenterology

## 2022-05-21 ENCOUNTER — Telehealth: Payer: Self-pay | Admitting: Family Medicine

## 2022-05-21 ENCOUNTER — Telehealth: Payer: Self-pay | Admitting: *Deleted

## 2022-05-21 DIAGNOSIS — K529 Noninfective gastroenteritis and colitis, unspecified: Secondary | ICD-10-CM

## 2022-05-21 NOTE — Telephone Encounter (Signed)
Clearance sent. Sorry it was missed previously.

## 2022-05-23 ENCOUNTER — Ambulatory Visit: Payer: Medicare HMO

## 2022-05-23 DIAGNOSIS — E538 Deficiency of other specified B group vitamins: Secondary | ICD-10-CM

## 2022-05-23 MED ORDER — CYANOCOBALAMIN 1000 MCG/ML IJ SOLN
1000.0000 ug | Freq: Once | INTRAMUSCULAR | Status: AC
  Administered 2024-10-05: 1000 ug via INTRAMUSCULAR

## 2022-05-23 NOTE — Progress Notes (Signed)
Per orders of Laurey Morale, MD, injection of B12 given in Right deltoid by Franco Collet. Patient tolerated injection well.  Lab Results  Component Value Date   [id number] 09/20/2021

## 2022-05-28 ENCOUNTER — Encounter: Payer: Self-pay | Admitting: Gastroenterology

## 2022-05-28 ENCOUNTER — Ambulatory Visit: Payer: Medicare HMO | Admitting: Family Medicine

## 2022-05-28 NOTE — Addendum Note (Signed)
Addended by: Alysia Penna A on: 05/28/2022 12:58 PM   Modules accepted: Orders

## 2022-05-28 NOTE — Addendum Note (Signed)
Addended by: Alysia Penna A on: 05/28/2022 01:01 PM   Modules accepted: Orders

## 2022-05-28 NOTE — Telephone Encounter (Signed)
I did the referral to Dr. Tarri Glenn

## 2022-05-28 NOTE — Telephone Encounter (Signed)
Spoke with patient aware referral was placed

## 2022-06-01 ENCOUNTER — Ambulatory Visit (AMBULATORY_SURGERY_CENTER): Payer: Self-pay | Admitting: *Deleted

## 2022-06-01 VITALS — Ht 65.0 in | Wt 144.0 lb

## 2022-06-01 DIAGNOSIS — R194 Change in bowel habit: Secondary | ICD-10-CM

## 2022-06-01 DIAGNOSIS — K625 Hemorrhage of anus and rectum: Secondary | ICD-10-CM

## 2022-06-01 NOTE — Progress Notes (Signed)
No egg or soy allergy known to patient   Patient denies ever being told they had issues or difficulty with intubation  No FH of Malignant Hyperthermia Pt is not on diet pills Pt is not on  home 02   Pt has issues with constipation, two day prep given. Plenvu sample given to pt. No A fib or A flutter   PV completed in person. Pt verified name, DOB.  Procedure explained to pt. Prep instructions reviewed, questions answered. Pt encouraged to call with questions or issues.  If pt has My chart, procedure instructions sent via My Chart    Went over instructions for blood thinners, pt stated an understanding.

## 2022-06-05 NOTE — Telephone Encounter (Signed)
Yes she may stop the Xarelto 2 days before the procedure and resume the day after

## 2022-06-05 NOTE — Telephone Encounter (Signed)
   [name] 11-09-1946 [id number]  Dear Dr. Sarajane Jews:  We have scheduled the above named patient for a(n) colonoscopy procedure. Our records show that (s)he is on anticoagulation therapy.  Please advise as to whether the patient may come off their therapy of Xarelto 2 days prior to their procedure which is to be determined.  Please route your response to Caryl Asp, [place] or fax response to [phone].  Sincerely,   Caryl Asp, [facility]

## 2022-06-06 ENCOUNTER — Ambulatory Visit (INDEPENDENT_AMBULATORY_CARE_PROVIDER_SITE_OTHER): Payer: Medicare HMO | Admitting: *Deleted

## 2022-06-06 DIAGNOSIS — E538 Deficiency of other specified B group vitamins: Secondary | ICD-10-CM | POA: Diagnosis not present

## 2022-06-06 MED ORDER — CYANOCOBALAMIN 1000 MCG/ML IJ SOLN
1000.0000 ug | Freq: Once | INTRAMUSCULAR | Status: AC
  Administered 2022-06-06: 1000 ug via INTRAMUSCULAR

## 2022-06-06 NOTE — Progress Notes (Signed)
Pt here for monthly B12 injection per   B12 1039mcg given IM, and pt tolerated injection well.  Next B12 injection scheduled for x 2 weeks

## 2022-06-07 ENCOUNTER — Other Ambulatory Visit: Payer: Self-pay | Admitting: Family Medicine

## 2022-06-07 DIAGNOSIS — Z86711 Personal history of pulmonary embolism: Secondary | ICD-10-CM

## 2022-06-07 NOTE — Telephone Encounter (Signed)
Patient informed. 

## 2022-06-15 ENCOUNTER — Encounter: Payer: Self-pay | Admitting: Gastroenterology

## 2022-06-21 ENCOUNTER — Telehealth: Payer: Self-pay

## 2022-06-21 ENCOUNTER — Ambulatory Visit (AMBULATORY_SURGERY_CENTER): Payer: Medicare HMO | Admitting: Gastroenterology

## 2022-06-21 ENCOUNTER — Encounter: Payer: Self-pay | Admitting: Gastroenterology

## 2022-06-21 VITALS — BP 100/64 | HR 73 | Temp 96.9°F | Resp 16 | Ht 65.0 in | Wt 144.0 lb

## 2022-06-21 DIAGNOSIS — Z7901 Long term (current) use of anticoagulants: Secondary | ICD-10-CM

## 2022-06-21 DIAGNOSIS — K625 Hemorrhage of anus and rectum: Secondary | ICD-10-CM | POA: Diagnosis not present

## 2022-06-21 DIAGNOSIS — R194 Change in bowel habit: Secondary | ICD-10-CM

## 2022-06-21 DIAGNOSIS — K649 Unspecified hemorrhoids: Secondary | ICD-10-CM

## 2022-06-21 MED ORDER — SODIUM CHLORIDE 0.9 % IV SOLN: 500.0000 mL | INTRAVENOUS | Status: DC

## 2022-06-21 NOTE — Progress Notes (Signed)
Referring Provider: Laurey Morale, MD Primary Care Physician:  Laurey Morale, MD  Indication for Procedure:  Rectal bleeding, change in bowel habits   IMPRESSION:  Painless rectal bleeding Change in bowel habits following her hospitalization in March now with fecal incontinence Appropriate candidate for monitored anesthesia care  PLAN: Colonoscopy in the [place] today   HPI: [name] is a 76 y.o. female presents for colonoscopy.   Colonoscopy with Dr. Deatra Ina was normal in 2011   Seen in the ER 02/04/2022 with painless rectal bleeding.  CT angiogram showed inflammatory wall thickening involving a segment of the distal transverse and descending colon centered around the splenic flexure consistent with infectious, inflammatory, or ischemic colitis.   During that hospitalization she saw Dr. Henrene Pastor.  He noted chronic constipation presenting with acute abdominal pain followed by rectal bleeding.  He thought her presentation was most consistent with acute ischemic colitis.  He advanced her diet.  He recommended MiraLAX for chronic constipation.   She returns today in hospital follow-up.  Her bowel habits have not returned to normal since her hospitalization in March.  Having mushy, explosive bowel movements. Frequent passage of stool even with urination and fecal soiling. No nocturnal accidents. No blood or mucous. She feels an abnormality on her right rectum.    Paternal aunt at age 52s and Maternal uncle at age 97s with colon cancer. There is no other known family history of colon cancer or polyps. No family history of stomach cancer or other GI malignancy. No family history of inflammatory bowel disease or celiac. Strong family history of lung cancer. Father with non Hodgkins lymphoma.      Past Medical History:  Diagnosis Date   Anemia    Cancer ([place])    Cataract    Clotting disorder ([place])    Community acquired pneumonia 01/22/2013   Depression with anxiety 04/18/2022   DVT  (deep venous thrombosis) ([place])    had 2 in right calf & 1 in the left calf   Dyspnea    nothing new, hx lung cancer   Fibromyalgia    Hyperlipidemia    Hypertension    no current problems no meds since cancer tx   Peripheral vascular disease (HCC)    PMR (polymyalgia rheumatica) ([place]) 10/31/2017   PONV (postoperative nausea and vomiting)    slow to wake up from anesthesia   Squamous cell lung cancer ([place]) 11/27/2011   sees Dr. Elmon Kirsch[facility] at [facility] and Dr. Jaye Beagle at [place] abuse    quit smoking in 2013    Past Surgical History:  Procedure Laterality Date   APPENDECTOMY     CHOLECYSTECTOMY     COLONOSCOPY     11/28/09 repeat in 10 years   ESOPHAGOGASTRODUODENOSCOPY  October 2013   severe esophagitis, done at [place]     right   LAMINECTOMY     lumbar   PORTACATH PLACEMENT     RADIOLOGY WITH ANESTHESIA N/A 01/02/2022   Procedure: MRI LUMBAR WITH AND WITHOUT WITH ANESTHESIA;  Surgeon: Radiologist, Medication, MD;  Location: [place];  Service: Radiology;  Laterality: N/A;   TONSILLECTOMY     VIDEO BRONCHOSCOPY  05/30/2012   Procedure: VIDEO BRONCHOSCOPY WITH FLUORO;  Surgeon: Elsie Stain, MD;  Location: [place];  Service: Cardiopulmonary;  Laterality: N/A;    Current Outpatient Medications  Medication Sig Dispense Refill   albuterol (PROVENTIL) (2.5 MG/3ML) 0.083% nebulizer  solution Take 3 mLs (2.5 mg total) by nebulization every 4 (four) hours as needed for wheezing or shortness of breath. 75 mL 12   albuterol (PROVENTIL) (2.5 MG/3ML) 0.083% nebulizer solution Take 3 mLs (2.5 mg total) by nebulization every 4 (four) hours as needed for wheezing or shortness of breath. 75 mL 12   albuterol (VENTOLIN HFA) 108 (90 Base) MCG/ACT inhaler Inhale 2 puffs into the lungs every 4 (four) hours as needed for wheezing or shortness of breath. 18 g 11   budesonide-formoterol (SYMBICORT) 160-4.5 MCG/ACT inhaler Inhale 2 puffs into the  lungs 2 (two) times daily. 10.2 g 12   enoxaparin (LOVENOX) 60 MG/0.6ML injection Inject 0.6 mLs (60 mg total) into the skin every 12 (twelve) hours. 6 mL 0   sodium chloride HYPERTONIC 3 % nebulizer solution Take by nebulization as needed for other. 750 mL 12   acetaminophen (TYLENOL) 500 MG tablet Take 1,000 mg by mouth every 6 (six) hours as needed for mild pain.     benzonatate (TESSALON) 200 MG capsule Take 1 capsule (200 mg total) by mouth 3 (three) times daily as needed for cough. 60 capsule 0   cyanocobalamin (,VITAMIN B-12,) 1000 MCG/ML injection Inject 1,000 mcg into the muscle every 14 (fourteen) days.     ondansetron (ZOFRAN) 8 MG tablet Take 1 tablet (8 mg total) by mouth every 6 (six) hours as needed for nausea or vomiting. 60 tablet 1   rivaroxaban (XARELTO) 20 MG TABS tablet TAKE 1 TABLET BY MOUTH DAILY WITH SUPPER 30 tablet 1   sertraline (ZOLOFT) 50 MG tablet Take 1 tablet (50 mg total) by mouth daily. (Patient not taking: Reported on 06/01/2022) 30 tablet 2   Current Facility-Administered Medications  Medication Dose Route Frequency Provider Last Rate Last Admin   0.9 %  sodium chloride infusion  500 mL Intravenous Continuous Thornton Park, MD       cyanocobalamin ((VITAMIN B-12)) injection 1,000 mcg  1,000 mcg Intramuscular Once Laurey Morale, MD        Allergies as of 06/21/2022 - Review Complete 06/21/2022  Allergen Reaction Noted   Ciprofloxacin Other (See Comments) 08/09/2018   Promethazine Other (See Comments) 03/20/2013   Polymyxin b Other (See Comments) 11/29/2020   Prednisone  11/11/2017   Trimethoprim Other (See Comments) 11/29/2020   Morphine and related Other (See Comments) 05/07/2015    Family History  Problem Relation Age of Onset   Hypertension Mother        family history   Atrial fibrillation Mother    Cancer Mother        Lung   Cancer Father        NHL   Multiple sclerosis Sister    Colon cancer Paternal Aunt    Anemia Other        FE  deficiency    Arthritis Other        family history   Cancer Other        colon 1st degree relative <60   Colon polyps Neg Hx    Esophageal cancer Neg Hx    Stomach cancer Neg Hx    Rectal cancer Neg Hx      Physical Exam: General:   Alert,  well-nourished, pleasant and cooperative in NAD Head:  Normocephalic and atraumatic. Eyes:  Sclera clear, no icterus.   Conjunctiva pink. Mouth:  No deformity or lesions.   Neck:  Supple; no masses or thyromegaly. Lungs:  Clear throughout to auscultation.   No  wheezes. Heart:  Regular rate and rhythm; no murmurs. Abdomen:  Soft, non-tender, nondistended, normal bowel sounds, no rebound or guarding.  Msk:  Symmetrical. No boney deformities LAD: No inguinal or umbilical LAD Extremities:  No clubbing or edema. Neurologic:  Alert and  oriented x4;  grossly nonfocal Skin:  No obvious rash or bruise. Psych:  Alert and cooperative. Normal mood and affect.     Studies/Results: No results found.    Griffon Herberg L. Tarri Glenn, MD, MPH 06/21/2022, 9:33 AM

## 2022-06-21 NOTE — Progress Notes (Signed)
Pt's states no medical or surgical changes since previsit or office visit. 

## 2022-06-21 NOTE — Telephone Encounter (Signed)
-----   Message from Thornton Park, MD sent at 06/21/2022 10:18 AM EDT ----- Office follow-up with me - next available.  Thanks.  KLB

## 2022-06-21 NOTE — Op Note (Signed)
[facility] Patient Name: [name] Procedure Date: 06/21/2022 9:31 AM MRN: [id number] Endoscopist: Thornton Park MD, MD Age: 76 Referring MD:  Date of Birth: 10/11/1946 Gender: Female Account #: [id number] Procedure:                Colonoscopy Indications:              Rectal bleeding, Change in bowel habits                           Normal colonoscopy 2011 Medicines:                Monitored Anesthesia Care Procedure:                Pre-Anesthesia Assessment:                           - Prior to the procedure, a History and Physical                            was performed, and patient medications and                            allergies were reviewed. The patient's tolerance of                            previous anesthesia was also reviewed. The risks                            and benefits of the procedure and the sedation                            options and risks were discussed with the patient.                            All questions were answered, and informed consent                            was obtained. Prior Anticoagulants: The patient has                            taken Xarelto (rivaroxaban), last dose was 5 days                            prior to procedure. ASA Grade Assessment: III - A                            patient with severe systemic disease. After                            reviewing the risks and benefits, the patient was                            deemed in satisfactory condition to undergo the  procedure.                           After obtaining informed consent, the colonoscope                            was passed under direct vision. Throughout the                            procedure, the patient's blood pressure, pulse, and                            oxygen saturations were monitored continuously. The                            CF [id number] #[id number] was introduced through the anus                             and advanced to the the cecum, identified by                            appendiceal orifice and ileocecal valve. A second                            forward view of the right colon was performed. The                            colonoscopy was performed without difficulty. The                            patient tolerated the procedure well. The quality                            of the bowel preparation was good. The ileocecal                            valve, appendiceal orifice, and rectum were                            photographed. Scope In: 9:43:45 AM Scope Out: 10:02:31 AM Scope Withdrawal Time: 0 hours 12 minutes 37 seconds  Total Procedure Duration: 0 hours 18 minutes 46 seconds  Findings:                 The perianal and digital rectal examinations were                            normal.                           The colon (entire examined portion) appeared                            normal. Biopsies were taken from the right colon,  left colon, and transverse colon with a cold                            forceps for histology. Estimated blood loss was                            minimal.                           Non-bleeding internal hemorrhoids were found.                           The exam was otherwise without abnormality on                            direct and retroflexion views. Complications:            No immediate complications. Estimated Blood Loss:     Estimated blood loss was minimal. Impression:               - The entire examined colon is normal. Biopsied.                           - Non-bleeding internal hemorrhoids.                           - The examination was otherwise normal on direct                            and retroflexion views. Recommendation:           - Patient has a contact number available for                            emergencies. The signs and symptoms of potential                            delayed complications  were discussed with the                            patient. Return to normal activities tomorrow.                            Written discharge instructions were provided to the                            patient.                           - Resume previous diet. Resume Xarelto today.                           - Continue present medications. Use a daily stool                            bulking agent such as Metamucil or Benefiber.                           -  Await pathology results.                           - Repeat colonoscopy is not recommended for                            surveillance due to age over 55.                           - Office follow-up to review these results.                           - Emerging evidence supports eating a diet of                            fruits, vegetables, grains, calcium, and yogurt                            while reducing red meat and alcohol may reduce the                            risk of colon cancer. Thornton Park MD, MD 06/21/2022 10:13:01 AM This report has been signed electronically.

## 2022-06-21 NOTE — Progress Notes (Signed)
PT taken to PACU. Monitors in place. VSS. Report given to RN. 

## 2022-06-21 NOTE — Progress Notes (Signed)
Called to room to assist during endoscopic procedure.  Patient ID and intended procedure confirmed with present staff. Received instructions for my participation in the procedure from the performing physician.  

## 2022-06-21 NOTE — Patient Instructions (Addendum)
RESUME Xarelto today at prior dose  YOU HAD AN ENDOSCOPIC PROCEDURE TODAY AT [facility]:   Refer to the procedure report that was given to you for any specific questions about what was found during the examination.  If the procedure report does not answer your questions, please call your gastroenterologist to clarify.  If you requested that your care partner not be given the details of your procedure findings, then the procedure report has been included in a sealed envelope for you to review at your convenience later.  YOU SHOULD EXPECT: Some feelings of bloating in the abdomen. Passage of more gas than usual.  Walking can help get rid of the air that was put into your GI tract during the procedure and reduce the bloating. If you had a lower endoscopy (such as a colonoscopy or flexible sigmoidoscopy) you may notice spotting of blood in your stool or on the toilet paper. If you underwent a bowel prep for your procedure, you may not have a normal bowel movement for a few days.  Please Note:  You might notice some irritation and congestion in your nose or some drainage.  This is from the oxygen used during your procedure.  There is no need for concern and it should clear up in a day or so.  SYMPTOMS TO REPORT IMMEDIATELY:  Following lower endoscopy (colonoscopy or flexible sigmoidoscopy):  Excessive amounts of blood in the stool  Significant tenderness or worsening of abdominal pains  Swelling of the abdomen that is new, acute  Fever of 100F or higher  For urgent or emergent issues, a gastroenterologist can be reached at any hour by calling [phone]. Do not use MyChart messaging for urgent concerns.    DIET:  We do recommend a small meal at first, but then you may proceed to your regular diet.  Drink plenty of fluids but you should avoid alcoholic beverages for 24 hours.  ACTIVITY:  You should plan to take it easy for the rest of today and you should NOT DRIVE or use heavy  machinery until tomorrow (because of the sedation medicines used during the test).    FOLLOW UP: Our staff will call the number listed on your records the next business day following your procedure.  We will call around 7:15- 8:00 am to check on you and address any questions or concerns that you may have regarding the information given to you following your procedure. If we do not reach you, we will leave a message.  If you develop any symptoms (ie: fever, flu-like symptoms, shortness of breath, cough etc.) before then, please call [phone].  If you test positive for Covid 19 in the 2 weeks post procedure, please call and report this information to [place].    If any biopsies were taken you will be contacted by phone or by letter within the next 1-3 weeks.  Please call us at [phone] if you have not heard about the biopsies in 3 weeks.    SIGNATURES/CONFIDENTIALITY: You and/or your care partner have signed paperwork which will be entered into your electronic medical record.  These signatures attest to the fact that that the information above on your After Visit Summary has been reviewed and is understood.  Full responsibility of the confidentiality of this discharge information lies with you and/or your care-partner.

## 2022-06-21 NOTE — Telephone Encounter (Signed)
Follow up scheduled for 07/24/22 at 2:10 pm with Dr. Tarri Glenn. Pt sent message in [facility].

## 2022-06-22 ENCOUNTER — Telehealth: Payer: Self-pay

## 2022-06-22 NOTE — Telephone Encounter (Signed)
  Follow up Call-     06/21/2022    8:10 AM  Call back number  Post procedure Call Back phone  # [phone]  Permission to leave phone message Yes     Patient questions:  Do you have a fever, pain , or abdominal swelling? No. Pain Score  0 *  Have you tolerated food without any problems? Yes.    Have you been able to return to your normal activities? Yes.    Do you have any questions about your discharge instructions: Diet   No. Medications  No. Follow up visit  No.  Do you have questions or concerns about your Care? No.  Actions: * If pain score is 4 or above: No action needed, pain <4.

## 2022-06-25 ENCOUNTER — Other Ambulatory Visit: Payer: Self-pay

## 2022-06-25 ENCOUNTER — Telehealth: Payer: Self-pay | Admitting: Family Medicine

## 2022-06-25 ENCOUNTER — Other Ambulatory Visit: Payer: Medicare HMO

## 2022-06-25 DIAGNOSIS — R300 Dysuria: Secondary | ICD-10-CM | POA: Diagnosis not present

## 2022-06-25 NOTE — Telephone Encounter (Signed)
Pt called to state she has a UTI and would like to know if she has to come in to do labs or can her husband just bring in her sample?  Please advise.

## 2022-06-25 NOTE — Addendum Note (Signed)
Addended by: Rosalyn Gess D on: 06/25/2022 03:00 PM   Modules accepted: Orders

## 2022-06-25 NOTE — Telephone Encounter (Addendum)
[name] with patient over the last few days she has been experiencing bladder pain, burning while urinating, also slight odor.   She has taken OTC medication to help with bladder pain.    Lab appointment scheduled for 06/25/22.

## 2022-06-25 NOTE — Telephone Encounter (Signed)
He can use a sterile cup to bring in a sample

## 2022-06-25 NOTE — Telephone Encounter (Signed)
Please advise 

## 2022-06-26 LAB — URINALYSIS
Bilirubin Urine: NEGATIVE
Glucose, UA: NEGATIVE
Ketones, ur: NEGATIVE
Nitrite: POSITIVE — AB
Specific Gravity, Urine: 1.02 (ref 1.001–1.035)
pH: <=5 (ref 5.0–8.0)

## 2022-06-26 MED ORDER — NITROFURANTOIN MONOHYD MACRO 100 MG PO CAPS: 100.0000 mg | ORAL_CAPSULE | Freq: Two times a day (BID) | ORAL | 0 refills | Status: DC

## 2022-06-27 ENCOUNTER — Encounter: Payer: Self-pay | Admitting: Gastroenterology

## 2022-07-10 ENCOUNTER — Ambulatory Visit (INDEPENDENT_AMBULATORY_CARE_PROVIDER_SITE_OTHER): Payer: Medicare HMO | Admitting: *Deleted

## 2022-07-10 ENCOUNTER — Telehealth: Payer: Self-pay | Admitting: Family Medicine

## 2022-07-10 DIAGNOSIS — E538 Deficiency of other specified B group vitamins: Secondary | ICD-10-CM

## 2022-07-10 MED ORDER — CYANOCOBALAMIN 1000 MCG/ML IJ SOLN
1000.0000 ug | Freq: Once | INTRAMUSCULAR | Status: AC
  Administered 2022-07-10: 1000 ug via INTRAMUSCULAR

## 2022-07-10 NOTE — Telephone Encounter (Signed)
Pt states she would like her Xarelto switched to [facility] [address] from now on.

## 2022-07-10 NOTE — Progress Notes (Signed)
Per orders of Dr. Fry, injection of B12 given by Chealsey Miyamoto. Patient tolerated injection well. 

## 2022-07-11 NOTE — Telephone Encounter (Signed)
Noted.   Pharmacy updated

## 2022-07-13 ENCOUNTER — Ambulatory Visit (INDEPENDENT_AMBULATORY_CARE_PROVIDER_SITE_OTHER): Payer: Medicare HMO

## 2022-07-13 VITALS — BP 120/60 | HR 84 | Temp 98.6°F | Ht 65.0 in | Wt 144.3 lb

## 2022-07-13 DIAGNOSIS — Z0000 Encounter for general adult medical examination without abnormal findings: Secondary | ICD-10-CM

## 2022-07-13 NOTE — Patient Instructions (Addendum)
[name] , Thank you for taking time to come for your Medicare Wellness Visit. I appreciate your ongoing commitment to your health goals. Please review the following plan we discussed and let me know if I can assist you in the future.   These are the goals we discussed:  Goals       Stay healthy (pt-stated)        Advanced directives: Yes  Conditions/risks identified: None  Next appointment: Follow up in one year for your annual wellness visit     Preventive Care 65 Years and Older, Female Preventive care refers to lifestyle choices and visits with your health care provider that can promote health and wellness. What does preventive care include? A yearly physical exam. This is also called an annual well check. Dental exams once or twice a year. Routine eye exams. Ask your health care provider how often you should have your eyes checked. Personal lifestyle choices, including: Daily care of your teeth and gums. Regular physical activity. Eating a healthy diet. Avoiding tobacco and drug use. Limiting alcohol use. Practicing safe sex. Taking low-dose aspirin every day. Taking vitamin and mineral supplements as recommended by your health care provider. What happens during an annual well check? The services and screenings done by your health care provider during your annual well check will depend on your age, overall health, lifestyle risk factors, and family history of disease. Counseling  Your health care provider may ask you questions about your: Alcohol use. Tobacco use. Drug use. Emotional well-being. Home and relationship well-being. Sexual activity. Eating habits. History of falls. Memory and ability to understand (cognition). Work and work Statistician. Reproductive health. Screening  You may have the following tests or measurements: Height, weight, and BMI. Blood pressure. Lipid and cholesterol levels. These may be checked every 5 years, or more frequently  if you are over 36 years old. Skin check. Lung cancer screening. You may have this screening every year starting at age 27 if you have a 30-pack-year history of smoking and currently smoke or have quit within the past 15 years. Fecal occult blood test (FOBT) of the stool. You may have this test every year starting at age 87. Flexible sigmoidoscopy or colonoscopy. You may have a sigmoidoscopy every 5 years or a colonoscopy every 10 years starting at age 110. Hepatitis C blood test. Hepatitis B blood test. Sexually transmitted disease (STD) testing. Diabetes screening. This is done by checking your blood sugar (glucose) after you have not eaten for a while (fasting). You may have this done every 1-3 years. Bone density scan. This is done to screen for osteoporosis. You may have this done starting at age 14. Mammogram. This may be done every 1-2 years. Talk to your health care provider about how often you should have regular mammograms. Talk with your health care provider about your test results, treatment options, and if necessary, the need for more tests. Vaccines  Your health care provider may recommend certain vaccines, such as: Influenza vaccine. This is recommended every year. Tetanus, diphtheria, and acellular pertussis (Tdap, Td) vaccine. You may need a Td booster every 10 years. Zoster vaccine. You may need this after age 68. Pneumococcal 13-valent conjugate (PCV13) vaccine. One dose is recommended after age 70. Pneumococcal polysaccharide (PPSV23) vaccine. One dose is recommended after age 38. Talk to your health care provider about which screenings and vaccines you need and how often you need them. This information is not intended to replace advice given to you by  your health care provider. Make sure you discuss any questions you have with your health care provider. Document Released: 12/09/2015 Document Revised: 08/01/2016 Document Reviewed: 09/13/2015 Elsevier Interactive Patient  Education  2017 [employer] Prevention in the Home Falls can cause injuries. They can happen to people of all ages. There are many things you can do to make your home safe and to help prevent falls. What can I do on the outside of my home? Regularly fix the edges of walkways and driveways and fix any cracks. Remove anything that might make you trip as you walk through a door, such as a raised step or threshold. Trim any bushes or trees on the path to your home. Use bright outdoor lighting. Clear any walking paths of anything that might make someone trip, such as rocks or tools. Regularly check to see if handrails are loose or broken. Make sure that both sides of any steps have handrails. Any raised decks and porches should have guardrails on the edges. Have any leaves, snow, or ice cleared regularly. Use sand or salt on walking paths during winter. Clean up any spills in your garage right away. This includes oil or grease spills. What can I do in the bathroom? Use night lights. Install grab bars by the toilet and in the tub and shower. Do not use towel bars as grab bars. Use non-skid mats or decals in the tub or shower. If you need to sit down in the shower, use a plastic, non-slip stool. Keep the floor dry. Clean up any water that spills on the floor as soon as it happens. Remove soap buildup in the tub or shower regularly. Attach bath mats securely with double-sided non-slip rug tape. Do not have throw rugs and other things on the floor that can make you trip. What can I do in the bedroom? Use night lights. Make sure that you have a light by your bed that is easy to reach. Do not use any sheets or blankets that are too big for your bed. They should not hang down onto the floor. Have a firm chair that has side arms. You can use this for support while you get dressed. Do not have throw rugs and other things on the floor that can make you trip. What can I do in the  kitchen? Clean up any spills right away. Avoid walking on wet floors. Keep items that you use a lot in easy-to-reach places. If you need to reach something above you, use a strong step stool that has a grab bar. Keep electrical cords out of the way. Do not use floor polish or wax that makes floors slippery. If you must use wax, use non-skid floor wax. Do not have throw rugs and other things on the floor that can make you trip. What can I do with my stairs? Do not leave any items on the stairs. Make sure that there are handrails on both sides of the stairs and use them. Fix handrails that are broken or loose. Make sure that handrails are as long as the stairways. Check any carpeting to make sure that it is firmly attached to the stairs. Fix any carpet that is loose or worn. Avoid having throw rugs at the top or bottom of the stairs. If you do have throw rugs, attach them to the floor with carpet tape. Make sure that you have a light switch at the top of the stairs and the bottom of the stairs. If you  do not have them, ask someone to add them for you. What else can I do to help prevent falls? Wear shoes that: Do not have high heels. Have rubber bottoms. Are comfortable and fit you well. Are closed at the toe. Do not wear sandals. If you use a stepladder: Make sure that it is fully opened. Do not climb a closed stepladder. Make sure that both sides of the stepladder are locked into place. Ask someone to hold it for you, if possible. Clearly mark and make sure that you can see: Any grab bars or handrails. First and last steps. Where the edge of each step is. Use tools that help you move around (mobility aids) if they are needed. These include: Canes. Walkers. Scooters. Crutches. Turn on the lights when you go into a dark area. Replace any light bulbs as soon as they burn out. Set up your furniture so you have a clear path. Avoid moving your furniture around. If any of your floors are  uneven, fix them. If there are any pets around you, be aware of where they are. Review your medicines with your doctor. Some medicines can make you feel dizzy. This can increase your chance of falling. Ask your doctor what other things that you can do to help prevent falls. This information is not intended to replace advice given to you by your health care provider. Make sure you discuss any questions you have with your health care provider. Document Released: 09/08/2009 Document Revised: 04/19/2016 Document Reviewed: 12/17/2014 Elsevier Interactive Patient Education  2017 [employer].

## 2022-07-13 NOTE — Progress Notes (Signed)
Subjective:   [name] is a 76 y.o. female who presents for Medicare Annual (Subsequent) preventive examination.  Review of Systems      Cardiac Risk Factors include: advanced age (>59men, >82 women);hypertension     Objective:    Today's Vitals   07/13/22 1331  BP: 120/60  Pulse: 84  Temp: 98.6 F (37 C)  TempSrc: Oral  SpO2: 96%  Weight: 144 lb 4.8 oz (65.5 kg)  Height: 5\' 5"  (1.651 m)   Body mass index is 24.01 kg/m.     07/13/2022    1:50 PM 02/04/2022   11:17 AM 01/02/2022    7:45 AM 12/08/2021   12:57 PM 04/11/2021   10:38 AM 12/24/2016   11:49 AM 05/07/2015    8:43 AM  Advanced Directives  Does Patient Have a Medical Advance Directive? Yes Yes Yes Yes Yes Yes Yes  Type of Paramedic of [place];Living will [place];Living will [place];Living will [place];Living will;Out of facility DNR (pink MOST or yellow form) Living will;Healthcare Power of Attorney Living will   Does patient want to make changes to medical advance directive? No - Patient declined No - Patient declined    No - Patient declined   Copy of [place] in Chart? No - copy requested No - copy requested         Current Medications (verified) Outpatient Encounter Medications as of 07/13/2022  Medication Sig   acetaminophen (TYLENOL) 500 MG tablet Take 1,000 mg by mouth every 6 (six) hours as needed for mild pain.   albuterol (PROVENTIL) (2.5 MG/3ML) 0.083% nebulizer solution Take 3 mLs (2.5 mg total) by nebulization every 4 (four) hours as needed for wheezing or shortness of breath.   albuterol (PROVENTIL) (2.5 MG/3ML) 0.083% nebulizer solution Take 3 mLs (2.5 mg total) by nebulization every 4 (four) hours as needed for wheezing or shortness of breath.   albuterol (VENTOLIN HFA) 108 (90 Base) MCG/ACT inhaler Inhale 2 puffs into the lungs every 4 (four) hours as needed for wheezing or shortness  of breath.   benzonatate (TESSALON) 200 MG capsule Take 1 capsule (200 mg total) by mouth 3 (three) times daily as needed for cough.   budesonide-formoterol (SYMBICORT) 160-4.5 MCG/ACT inhaler Inhale 2 puffs into the lungs 2 (two) times daily.   cyanocobalamin (,VITAMIN B-12,) 1000 MCG/ML injection Inject 1,000 mcg into the muscle every 14 (fourteen) days.   enoxaparin (LOVENOX) 60 MG/0.6ML injection Inject 0.6 mLs (60 mg total) into the skin every 12 (twelve) hours.   nitrofurantoin, macrocrystal-monohydrate, (MACROBID) 100 MG capsule Take 1 capsule (100 mg total) by mouth 2 (two) times daily.   ondansetron (ZOFRAN) 8 MG tablet Take 1 tablet (8 mg total) by mouth every 6 (six) hours as needed for nausea or vomiting.   rivaroxaban (XARELTO) 20 MG TABS tablet TAKE 1 TABLET BY MOUTH DAILY WITH SUPPER   sertraline (ZOLOFT) 50 MG tablet Take 1 tablet (50 mg total) by mouth daily. (Patient not taking: Reported on 06/01/2022)   sodium chloride HYPERTONIC 3 % nebulizer solution Take by nebulization as needed for other.   Facility-Administered Encounter Medications as of 07/13/2022  Medication   cyanocobalamin ((VITAMIN B-12)) injection 1,000 mcg    Allergies (verified) Ciprofloxacin, Promethazine, Polymyxin b, Prednisone, Trimethoprim, and Morphine and related   History: Past Medical History:  Diagnosis Date   Anemia    Cancer ([place])    Cataract    Clotting  disorder ([place])    Community acquired pneumonia 01/22/2013   Depression with anxiety 04/18/2022   DVT (deep venous thrombosis) (HCC)    had 2 in right calf & 1 in the left calf   Dyspnea    nothing new, hx lung cancer   Fibromyalgia    Hyperlipidemia    Hypertension    no current problems no meds since cancer tx   Peripheral vascular disease (HCC)    PMR (polymyalgia rheumatica) ([place]) 10/31/2017   PONV (postoperative nausea and vomiting)    slow to wake up from anesthesia   Squamous cell lung cancer ([place]) 11/27/2011   [facility] Elmon [facility]sch[facility] at [facility] and Dr. Jaye Beagle at [facility]    quit smoking in 2013   Past Surgical History:  Procedure Laterality Date   APPENDECTOMY     CHOLECYSTECTOMY     COLONOSCOPY     11/28/09 repeat in 10 years   ESOPHAGOGASTRODUODENOSCOPY  October 2013   severe esophagitis, done at [facility]     right   LAMINECTOMY     lumbar   PORTACATH PLACEMENT     RADIOLOGY WITH ANESTHESIA N/A 01/02/2022   Procedure: MRI LUMBAR WITH AND WITHOUT WITH ANESTHESIA;  Surgeon: Radiologist, Medication, MD;  Location: [facility];  Service: Radiology;  Laterality: N/A;   TONSILLECTOMY     VIDEO BRONCHOSCOPY  05/30/2012   Procedure: VIDEO BRONCHOSCOPY WITH FLUORO;  Surgeon: Elsie Stain, MD;  Location: [facility];  Service: Cardiopulmonary;  Laterality: N/A;   Family History  Problem Relation Age of Onset   Hypertension Mother        family history   Atrial fibrillation Mother    Cancer Mother        Lung   Cancer Father        NHL   Multiple sclerosis Sister    Colon cancer Paternal Aunt    Anemia Other        FE deficiency    Arthritis Other        family history   Cancer Other        colon 1st degree relative <60   Colon polyps Neg Hx    Esophageal cancer Neg Hx    Stomach cancer Neg Hx    Rectal cancer Neg Hx    Social History   Socioeconomic History   Marital status: Married    Spouse name: Gwenlyn Perking   Number of children: 3   Years of education: Not on file   Highest education level: Not on file  Occupational History   Occupation: Retired Surveyor, minerals.    Employer: UNEMPLOYED  Tobacco Use   Smoking status: Former    Types: Cigarettes    Quit date: 05/23/2012    Years since quitting: 10.1   Smokeless tobacco: Never   Tobacco comments:    Smoking since age 39 yrs old, quit 05/23/12  Vaping Use   Vaping Use: Never used  Substance and Sexual Activity   Alcohol use: Yes    Alcohol/week: 4.0 standard drinks of alcohol    Types: 4 Glasses of  wine per week   Drug use: No   Sexual activity: Not Currently    Birth control/protection: Post-menopausal  Other Topics Concern   Not on file  Social History Narrative   ** Merged History Encounter ** Married.  Lives with husband in [place].  Independent of ADLs.  Right Handed    Lives in a one level home    Social Determinants of Health   Financial Resource Strain: Low Risk  (07/13/2022)   Overall Financial Resource Strain (CARDIA)    Difficulty of Paying Living Expenses: Not hard at all  Food Insecurity: No Food Insecurity (07/13/2022)   Hunger Vital Sign    Worried About Running Out of Food in the Last Year: Never true    Ran Out of Food in the Last Year: Never true  Transportation Needs: No Transportation Needs (07/13/2022)   PRAPARE - Hydrologist (Medical): No    Lack of Transportation (Non-Medical): No  Physical Activity: Insufficiently Active (07/13/2022)   Exercise Vital Sign    Days of Exercise per Week: 3 days    Minutes of Exercise per Session: 30 min  Stress: No Stress Concern Present (07/13/2022)   [place]    Feeling of Stress : Not at all  Social Connections: Moderately Integrated (07/13/2022)   Social Connection and Isolation Panel [NHANES]    Frequency of Communication with Friends and Family: More than three times a week    Frequency of Social Gatherings with Friends and Family: More than three times a week    Attends Religious Services: Never    Marine scientist or Organizations: Yes    Attends Music therapist: More than 4 times per year    Marital Status: Married    Tobacco Counseling Counseling given: Not Answered Tobacco comments: Smoking since age 76 yrs old, quit 05/23/12   Clinical Intake:  Pre-visit preparation completed: No  Pain : No/denies pain  Diabetic? No  Interpreter Needed?: No Activities of Daily Living     07/13/2022    1:48 PM 02/04/2022    4:07 PM  In your present state of health, do you have any difficulty performing the following activities:  Hearing? 0   Vision? 0   Difficulty concentrating or making decisions? 0   Walking or climbing stairs? 0   Dressing or bathing? 0   Doing errands, shopping? 0 0  Preparing Food and eating ? N   Using the Toilet? N   In the past six months, have you accidently leaked urine? N   Do you have problems with loss of bowel control? N   Managing your Medications? N   Managing your Finances? N   Housekeeping or managing your Housekeeping? N     Patient Care Team: Laurey Morale, MD as PCP - General P[facility] Pronto Delena Serve, DO as Consulting Physician (Neurology)  Indicate any recent Medical Services you may have received from other than [facility] providers in the past year (date may be approximate).     Assessment:   This is a routine wellness examination for [place].  Hearing/Vision screen Hearing Screening - Comments:: No hearing difficulty Vision Screening - Comments:: Wears reading glasses.  Dietary issues and exercise activities discussed: Exercise limited by: None identified   Goals Addressed               This Visit's Progress     Stay healthy (pt-stated)         Depression Screen    07/13/2022    1:45 PM 05/09/2022   11:17 AM 09/20/2021   11:59 AM 10/31/2017   10:54 AM 11/05/2014    1:33 PM  PHQ 2/9 Scores  PHQ - 2 Score 0 4 0 0 1  PHQ-  9 Score  10 0      Fall Risk    07/13/2022    1:49 PM 05/09/2022   11:16 AM 02/14/2022    1:09 PM 12/08/2021   12:57 PM 09/20/2021   11:59 AM  Fall Risk   Falls in the past year? 0 0 0 0 0  Number falls in past yr: 0 0 0 0 0  Injury with Fall? 0 0 0 0 0  Risk for fall due to : No Fall Risks No Fall Risks No Fall Risks  No Fall Risks  Follow up  Falls evaluation completed       FALL RISK PREVENTION PERTAINING TO THE HOME:  Any stairs in or around the home? No If so, are there any without  handrails? No  Home free of loose throw rugs in walkways, pet beds, electrical cords, etc? Yes  Adequate lighting in your home to reduce risk of falls? Yes   ASSISTIVE DEVICES UTILIZED TO PREVENT FALLS:  Life alert? No  Use of a cane, walker or w/c? No  Grab bars in the bathroom? Yes  Shower chair or bench in shower? Yes  Elevated toilet seat or a handicapped toilet? No   TIMED UP AND GO:  Was the test performed? Yes .  Length of time to ambulate 10 feet: 10 sec.   Gait steady and fast without use of assistive device  Cognitive Function:        07/13/2022    1:50 PM  6CIT Screen  What Year? 0 points  What month? 0 points  What time? 0 points  Count back from 20 0 points  Months in reverse 0 points  Repeat phrase 0 points  Total Score 0 points    Immunizations Immunization History  Administered Date(s) Administered   Fluad Quad(high Dose 65+) 07/29/2019   Influenza Split 08/26/2013   Influenza, High Dose Seasonal PF 09/02/2015, 07/25/2016, 10/10/2017, 10/07/2018   Influenza,inj,Quad PF,6+ Mos 12/08/2014, 09/02/2020, 09/20/2021   Influenza,trivalent, recombinat, inj, PF 10/02/2013   Influenza-Unspecified 10/10/2017   PFIZER(Purple Top)SARS-COV-2 Vaccination 12/31/2019, 01/25/2020, 06/23/2021   Pfizer Covid-19 Vaccine Bivalent Booster 65yrs & up 11/02/2021   Pneumococcal Conjugate-13 12/15/2014   Pneumococcal Polysaccharide-23 05/29/2012   Zoster, Live 12/21/2014    TDAP status: Due, Education has been provided regarding the importance of this vaccine. Advised may receive this vaccine at local pharmacy or Health Dept. Aware to provide a copy of the vaccination record if obtained from local pharmacy or Health Dept. Verbalized acceptance and understanding.  Flu Vaccine status: Up to date  Pneumococcal vaccine status: Up to date  Covid-19 vaccine status: Completed vaccines  Qualifies for Shingles Vaccine? Yes   Zostavax completed No   Shingrix Completed?: No.     Education has been provided regarding the importance of this vaccine. Patient has been advised to call insurance company to determine out of pocket expense if they have not yet received this vaccine. Advised may also receive vaccine at local pharmacy or Health Dept. Verbalized acceptance and understanding.  Screening Tests Health Maintenance  Topic Date Due   Hepatitis C Screening  Never done   INFLUENZA VACCINE  06/26/2022   COVID-19 Vaccine (5 - Pfizer risk series) 07/29/2022 (Originally 12/28/2021)   Zoster Vaccines- Shingrix (1 of 2) 10/13/2022 (Originally 02/16/1965)   DEXA SCAN  07/14/2023 (Originally 02/15/2010)   TETANUS/TDAP  07/14/2023 (Originally 02/16/1965)   Pneumonia Vaccine 41+ Years old  Completed   HPV VACCINES  Aged Out   COLONOSCOPY (Pts  45-86yrs Insurance coverage will need to be confirmed)  Discontinued    Health Maintenance  Health Maintenance Due  Topic Date Due   Hepatitis C Screening  Never done   INFLUENZA VACCINE  06/26/2022    Colorectal cancer screening: No longer required.   Mammogram status: No longer required due to Age.  Bone Density status: Ordered Patient deferred. Pt provided with contact info and advised to call to schedule appt.  Lung Cancer Screening: (Low Dose CT Chest recommended if Age 53-80 years, 30 pack-year currently smoking OR have quit w/in 15years.) does not qualify.     Additional Screening:  Hepatitis C Screening: does qualify; Completed Patient deferred  Vision Screening: Recommended annual ophthalmology exams for early detection of glaucoma and other disorders of the eye. Is the patient up to date with their annual eye exam?  Patient deferred Who is the provider or what is the name of the office in which the patient attends annual eye exams? Patient deferred If pt is not established with a provider, would they like to be referred to a provider to establish care? No .   Dental Screening: Recommended annual dental exams for  proper oral hygiene  Community Resource Referral / Chronic Care Management:  CRR required this visit?  No   CCM required this visit?  No      Plan:     I have personally reviewed and noted the following in the patient's chart:   Medical and social history Use of alcohol, tobacco or illicit drugs  Current medications and supplements including opioid prescriptions. Patient is not currently taking opioid prescriptions. Functional ability and status Nutritional status Physical activity Advanced directives List of other physicians Hospitalizations, surgeries, and ER visits in previous 12 months Vitals Screenings to include cognitive, depression, and falls Referrals and appointments  In addition, I have reviewed and discussed with patient certain preventive protocols, quality metrics, and best practice recommendations. A written personalized care plan for preventive services as well as general preventive health recommendations were provided to patient.     Criselda Peaches, LPN   7/59/1638   Nurse Notes: Patient due labs Hep-C Screening

## 2022-07-24 ENCOUNTER — Ambulatory Visit: Payer: Medicare HMO | Admitting: Gastroenterology

## 2022-07-25 ENCOUNTER — Ambulatory Visit: Payer: Medicare HMO

## 2022-07-25 ENCOUNTER — Telehealth: Payer: Self-pay

## 2022-07-25 ENCOUNTER — Ambulatory Visit (INDEPENDENT_AMBULATORY_CARE_PROVIDER_SITE_OTHER): Payer: Medicare HMO

## 2022-07-25 DIAGNOSIS — E538 Deficiency of other specified B group vitamins: Secondary | ICD-10-CM

## 2022-07-25 MED ORDER — CYANOCOBALAMIN 1000 MCG/ML IJ SOLN
1000.0000 ug | Freq: Once | INTRAMUSCULAR | Status: AC
  Administered 2022-07-25: 1000 ug via INTRAMUSCULAR

## 2022-07-25 NOTE — Telephone Encounter (Signed)
Patient was seen today for her 2wks VitB12 injection.   There is a lab note from 09/20/2021 to have her back in VitB12 injection every 2 wks.   She started her injection on 11/01/2021. When would you like for her to retest her VitB12?  Please advise. Thank you!

## 2022-07-25 NOTE — Progress Notes (Signed)
Per orders of [employer] , injection of VitB12  given by [employer]. Patient tolerated injection well.

## 2022-07-26 ENCOUNTER — Ambulatory Visit: Payer: Medicare HMO

## 2022-07-31 NOTE — Telephone Encounter (Signed)
We should go ahead and check this now. Please set up a B12 lab sometime soon, ideally about one week after a shot

## 2022-08-03 ENCOUNTER — Other Ambulatory Visit: Payer: Self-pay

## 2022-08-03 DIAGNOSIS — E538 Deficiency of other specified B group vitamins: Secondary | ICD-10-CM

## 2022-08-03 NOTE — Telephone Encounter (Signed)
Spoke with patient, lab appt scheduled  for 08/20/22 due to patient being out of town.   Lab order placed.

## 2022-08-07 ENCOUNTER — Emergency Department (HOSPITAL_BASED_OUTPATIENT_CLINIC_OR_DEPARTMENT_OTHER): Payer: Medicare HMO

## 2022-08-07 ENCOUNTER — Other Ambulatory Visit: Payer: Self-pay

## 2022-08-07 ENCOUNTER — Emergency Department (HOSPITAL_BASED_OUTPATIENT_CLINIC_OR_DEPARTMENT_OTHER)
Admission: EM | Admit: 2022-08-07 | Discharge: 2022-08-07 | Disposition: A | Discharge status: Home / Self Care | Payer: Medicare HMO | Attending: Emergency Medicine | Admitting: Emergency Medicine

## 2022-08-07 ENCOUNTER — Emergency Department (HOSPITAL_BASED_OUTPATIENT_CLINIC_OR_DEPARTMENT_OTHER): Payer: Medicare HMO | Admitting: Radiology

## 2022-08-07 ENCOUNTER — Encounter (HOSPITAL_BASED_OUTPATIENT_CLINIC_OR_DEPARTMENT_OTHER): Payer: Self-pay | Admitting: *Deleted

## 2022-08-07 DIAGNOSIS — S93402A Sprain of unspecified ligament of left ankle, initial encounter: Secondary | ICD-10-CM | POA: Diagnosis not present

## 2022-08-07 DIAGNOSIS — Z85118 Personal history of other malignant neoplasm of bronchus and lung: Secondary | ICD-10-CM | POA: Diagnosis not present

## 2022-08-07 DIAGNOSIS — W19XXXA Unspecified fall, initial encounter: Secondary | ICD-10-CM | POA: Diagnosis not present

## 2022-08-07 DIAGNOSIS — Y9301 Activity, walking, marching and hiking: Secondary | ICD-10-CM | POA: Insufficient documentation

## 2022-08-07 DIAGNOSIS — Z7901 Long term (current) use of anticoagulants: Secondary | ICD-10-CM | POA: Diagnosis not present

## 2022-08-07 DIAGNOSIS — S99912A Unspecified injury of left ankle, initial encounter: Secondary | ICD-10-CM | POA: Diagnosis present

## 2022-08-07 NOTE — Discharge Instructions (Signed)
Please rest your ankle.  Ice and elevation of the foot can help reduce the pain and swelling. Take Tylenol as needed for pain. Please return to the emergency department if the pain persist and there is increased difficulty with walking.

## 2022-08-07 NOTE — ED Provider Notes (Signed)
[facility] EMERGENCY DEPT Provider Note   CSN: [id number] Arrival date & time: 08/07/22  1752     History {Add pertinent medical, surgical, social history, OB history to HPI:1} Chief Complaint  Patient presents with   Ankle Pain    [name] is a 76 y.o. female.  Patient is a 76 year old female with history of some cell lung cancer who presented to the emergency room for left ankle pain for 1 hour.  Patient was walking when her left foot got caught on the rug.  She fell forward on her knees.  She was able to get back up on her own.  Denies LOC.  She is taking Xarelto but denies any bleeding.  Patient noticed swelling on the lateral left ankle and pain below the left ankle and left midfoot.  She states that the pain increase when she everts her left foot and when she walks.  She has taken Tylenol which helped reduce the pain. States pain is 6/10.  Patient is ambulatory with some difficulty.   Ankle Pain      Home Medications Prior to Admission medications   Medication Sig Start Date End Date Taking? Authorizing Provider  acetaminophen (TYLENOL) 500 MG tablet Take 1,000 mg by mouth every 6 (six) hours as needed for mild pain.    [provider]  albuterol (PROVENTIL) (2.5 MG/3ML) 0.083% nebulizer solution Take 3 mLs (2.5 mg total) by nebulization every 4 (four) hours as needed for wheezing or shortness of breath. 03/20/21   Laurey Morale, MD  albuterol (PROVENTIL) (2.5 MG/3ML) 0.083% nebulizer solution Take 3 mLs (2.5 mg total) by nebulization every 4 (four) hours as needed for wheezing or shortness of breath. 04/27/22   Laurey Morale, MD  albuterol (VENTOLIN HFA) 108 (90 Base) MCG/ACT inhaler Inhale 2 puffs into the lungs every 4 (four) hours as needed for wheezing or shortness of breath. 04/07/21   Laurey Morale, MD  benzonatate (TESSALON) 200 MG capsule Take 1 capsule (200 mg total) by mouth 3 (three) times daily as needed for cough. 04/26/22   Laurey Morale, MD  budesonide-formoterol [facility]) 160-4.5 MCG/ACT inhaler Inhale 2 puffs into the lungs 2 (two) times daily. 04/27/22   Laurey Morale, MD  cyanocobalamin (,VITAMIN B-12,) 1000 MCG/ML injection Inject 1,000 mcg into the muscle every 14 (fourteen) days.    [provider]  enoxaparin (LOVENOX) 60 MG/0.6ML injection Inject 0.6 mLs (60 mg total) into the skin every 12 (twelve) hours. 04/18/22   Laurey Morale, MD  nitrofurantoin, macrocrystal-monohydrate, (MACROBID) 100 MG capsule Take 1 capsule (100 mg total) by mouth 2 (two) times daily. 06/26/22   Laurey Morale, MD  ondansetron (ZOFRAN) 8 MG tablet Take 1 tablet (8 mg total) by mouth every 6 (six) hours as needed for nausea or vomiting. 04/27/22   Laurey Morale, MD  rivaroxaban (XARELTO) 20 MG TABS tablet TAKE 1 TABLET BY MOUTH DAILY WITH SUPPER 06/07/22   Laurey Morale, MD  sertraline (ZOLOFT) 50 MG tablet Take 1 tablet (50 mg total) by mouth daily. Patient not taking: Reported on 06/01/2022 04/18/22   Laurey Morale, MD  sodium chloride HYPERTONIC 3 % nebulizer solution Take by nebulization as needed for other. 04/27/22   Laurey Morale, MD      Allergies    Ciprofloxacin, Promethazine, Polymyxin b, Prednisone, Trimethoprim, and Morphine and related    Review of Systems   Review of Systems  Skin:  Negative for  color change.       Mild swelling below lateral left ankle.    Physical Exam Updated Vital Signs BP (!) 142/91 (BP Location: Right Arm)   Pulse (!) 101   Temp 98.6 F (37 C)   Resp 14   SpO2 99%  Physical Exam Constitutional:      Appearance: Normal appearance.  HENT:     Head: Normocephalic and atraumatic.  Pulmonary:     Effort: No respiratory distress.  Musculoskeletal:        General: Swelling (mild swelling on the lateral left ankle) and tenderness present.  Neurological:     General: No focal deficit present.     Mental Status: She is alert.     ED Results / Procedures / Treatments   Labs (all  labs ordered are listed, but only abnormal results are displayed) Labs Reviewed - No data to display  EKG None  Radiology DG Ankle Complete Left  Result Date: 08/07/2022 CLINICAL DATA:  Twisting injury EXAM: LEFT ANKLE COMPLETE - 3+ VIEW COMPARISON:  11/05/2018 FINDINGS: No fracture or malalignment. Stable sclerosis at the distal tibia. Ankle mortise is symmetric. Tiny plantar calcaneal spur IMPRESSION: No acute osseous abnormality Electronically Signed   By: Donavan Foil M.D.   On: 08/07/2022 18:32    Procedures Procedures  {Document cardiac monitor, telemetry assessment procedure when appropriate:1}  Medications Ordered in ED Medications - No data to display  ED Course/ Medical Decision Making/ A&P                           Medical Decision Making Patient is a 76 year old female with past medical history of squamous cell lung cancer presents to the ED for lateral left ankle pain for 1 hour.  Patient fell on her ankles and denies hitting other body parts.  She is on Xarelto and denies any bleeding.  On physical exam there was mild swelling on the lateral left ankle and tenderness below the left ankle and along the left midfoot.  Neuro function on the foot was intact.  2+ dorsalis pedis and medial tibialis.  She has some difficulty with foot eversion.  X-ray of the ankle was negative.  Patient is most likely to have an ankle sprain.  Advised patient to rest, ice, take Tylenol for pain and elevate her left foot.  Will provide patient an ankle brace for better support.  Patient will return to the emergency department if symptoms worsen.      ***  {Document critical care time when appropriate:1} {Document review of labs and clinical decision tools ie heart score, Chads2Vasc2 etc:1}  {Document your independent review of radiology images, and any outside records:1} {Document your discussion with family members, caretakers, and with consultants:1} {Document social determinants of  health affecting pt's care:1} {Document your decision making why or why not admission, treatments were needed:1} Final Clinical Impression(s) / ED Diagnoses Final diagnoses:  None    Rx / DC Orders ED Discharge Orders     None

## 2022-08-07 NOTE — ED Triage Notes (Signed)
Pt is here due to left ankle pain since twisting her ankle while walking. Pt ambulatory to triage

## 2022-08-08 ENCOUNTER — Ambulatory Visit (INDEPENDENT_AMBULATORY_CARE_PROVIDER_SITE_OTHER): Payer: Medicare HMO | Admitting: *Deleted

## 2022-08-08 DIAGNOSIS — E538 Deficiency of other specified B group vitamins: Secondary | ICD-10-CM | POA: Diagnosis not present

## 2022-08-08 MED ORDER — CYANOCOBALAMIN 1000 MCG/ML IJ SOLN
1000.0000 ug | Freq: Once | INTRAMUSCULAR | Status: AC
  Administered 2022-08-08: 1000 ug via INTRAMUSCULAR

## 2022-08-08 NOTE — Progress Notes (Signed)
Per orders of Dr. Fry, injection of Cyanocobalamin 1000mcg given by Chiniqua Kilcrease A. Patient tolerated injection well.  

## 2022-08-09 ENCOUNTER — Other Ambulatory Visit: Payer: Self-pay

## 2022-08-09 ENCOUNTER — Other Ambulatory Visit: Payer: Self-pay | Admitting: Family Medicine

## 2022-08-09 DIAGNOSIS — H9201 Otalgia, right ear: Secondary | ICD-10-CM | POA: Diagnosis not present

## 2022-08-09 DIAGNOSIS — R519 Headache, unspecified: Secondary | ICD-10-CM | POA: Diagnosis not present

## 2022-08-09 DIAGNOSIS — Z87891 Personal history of nicotine dependence: Secondary | ICD-10-CM | POA: Diagnosis not present

## 2022-08-09 DIAGNOSIS — Z9221 Personal history of antineoplastic chemotherapy: Secondary | ICD-10-CM | POA: Diagnosis not present

## 2022-08-09 DIAGNOSIS — R1310 Dysphagia, unspecified: Secondary | ICD-10-CM | POA: Diagnosis not present

## 2022-08-09 DIAGNOSIS — E7800 Pure hypercholesterolemia, unspecified: Secondary | ICD-10-CM | POA: Diagnosis not present

## 2022-08-09 DIAGNOSIS — J9819 Other pulmonary collapse: Secondary | ICD-10-CM | POA: Diagnosis not present

## 2022-08-09 DIAGNOSIS — I272 Pulmonary hypertension, unspecified: Secondary | ICD-10-CM | POA: Diagnosis not present

## 2022-08-09 DIAGNOSIS — I5030 Unspecified diastolic (congestive) heart failure: Secondary | ICD-10-CM | POA: Diagnosis not present

## 2022-08-09 DIAGNOSIS — Z86711 Personal history of pulmonary embolism: Secondary | ICD-10-CM

## 2022-08-09 DIAGNOSIS — C3490 Malignant neoplasm of unspecified part of unspecified bronchus or lung: Secondary | ICD-10-CM | POA: Diagnosis not present

## 2022-08-09 DIAGNOSIS — R208 Other disturbances of skin sensation: Secondary | ICD-10-CM | POA: Diagnosis not present

## 2022-08-13 ENCOUNTER — Telehealth: Payer: Self-pay | Admitting: *Deleted

## 2022-08-13 NOTE — Telephone Encounter (Signed)
     Patient  visit on 08/07/2022  at [facility] ed was for fall  Have you been able to follow up with your primary care physician? Patent says sprein healing nicely and the pain is gone  The patient was able to obtain any needed medicine or equipment.  Are there diet recommendations that you are having difficulty following?  Patient expresses understanding of discharge instructions and education provided has no other needs at this time.    [facility] [phone] [address] , [place] [address] Email : [email or web address]

## 2022-08-20 ENCOUNTER — Telehealth: Payer: Self-pay

## 2022-08-20 ENCOUNTER — Encounter: Payer: Self-pay | Admitting: Family Medicine

## 2022-08-20 ENCOUNTER — Ambulatory Visit: Payer: Medicare HMO

## 2022-08-20 ENCOUNTER — Other Ambulatory Visit: Payer: Medicare HMO

## 2022-08-20 ENCOUNTER — Telehealth (INDEPENDENT_AMBULATORY_CARE_PROVIDER_SITE_OTHER): Payer: Medicare HMO | Admitting: Family Medicine

## 2022-08-20 DIAGNOSIS — U071 COVID-19: Secondary | ICD-10-CM

## 2022-08-20 MED ORDER — METHYLPREDNISOLONE 4 MG PO TBPK: ORAL_TABLET | ORAL | 0 refills | Status: DC

## 2022-08-20 MED ORDER — AMOXICILLIN-POT CLAVULANATE 875-125 MG PO TABS: 1.0000 | ORAL_TABLET | Freq: Two times a day (BID) | ORAL | 0 refills | Status: DC

## 2022-08-20 NOTE — Telephone Encounter (Signed)
Caller states she came down with s/s on Tuesday and tested positive for COVID on Wednesday. Symptoms include fever, chills, loss of taste and smell, cough, HA.  08/20/2022 9:23:38 AM See HCP within 4 Hours (or PCP triage) Curlene Labrum, RN, [place]  Comments User: Dimas Chyle, RN Date/Time Eilene Ghazi Time): 08/20/2022 9:26:46 AM caller connected with office for appt.  Referrals REFERRED TO PCP OFFICE  Pt has appt with Dr Sarajane Jews today at 3:15

## 2022-08-20 NOTE — Progress Notes (Signed)
Subjective:    Patient ID: [name], female    DOB: 1946/04/29, 76 y.o.   MRN: [id number]  HPI Virtual Visit via Video Note  I connected with the patient on 08/20/22 at  3:15 PM EDT by a video enabled telemedicine application and verified that I am speaking with the correct person using two identifiers.  Location patient: home Location provider:work or home office Persons participating in the virtual visit: patient, provider  I discussed the limitations of evaluation and management by telemedicine and the availability of in person appointments. The patient expressed understanding and agreed to proceed.   HPI: Here for a Covid-19 infection. Last week after visiting her sister she began to have fever, coughing up yellow sputum, wheezing, and headache. No NVD. She has tested positive twice for the Covid virus. She is drinking fluids and using nebulizer several times a day. Her oxygen sats are at her baseline of 95-96%.    ROS: See pertinent positives and negatives per HPI.  Past Medical History:  Diagnosis Date   Anemia    Cancer ([place])    Cataract    Clotting disorder ([place])    Community acquired pneumonia 01/22/2013   Depression with anxiety 04/18/2022   DVT (deep venous thrombosis) ([place])    had 2 in right calf & 1 in the left calf   Dyspnea    nothing new, hx lung cancer   Fibromyalgia    Hyperlipidemia    Hypertension    no current problems no meds since cancer tx   Peripheral vascular disease (HCC)    PMR (polymyalgia rheumatica) ([place]) 10/31/2017   PONV (postoperative nausea and vomiting)    slow to wake up from anesthesia   Squamous cell lung cancer ([place]) 11/27/2011   sees Dr. Elmon Kirschner at [facility] and Dr. Jaye Beagle at [place] [facility]    quit smoking in 2013    Past Surgical History:  Procedure Laterality Date   APPENDECTOMY     CHOLECYSTECTOMY     COLONOSCOPY     11/28/09 repeat in 10 years   ESOPHAGOGASTRODUODENOSCOPY   October 2013   severe esophagitis, done at [place]     right   LAMINECTOMY     lumbar   PORTACATH PLACEMENT     RADIOLOGY WITH ANESTHESIA N/A 01/02/2022   Procedure: MRI LUMBAR WITH AND WITHOUT WITH ANESTHESIA;  Surgeon: Radiologist, Medication, MD;  Location: [facility];  Service: Radiology;  Laterality: N/A;   TONSILLECTOMY     VIDEO BRONCHOSCOPY  05/30/2012   Procedure: VIDEO BRONCHOSCOPY WITH FLUORO;  Surgeon: Elsie Stain, MD;  Location: [facility];  Service: Cardiopulmonary;  Laterality: N/A;    Family History  Problem Relation Age of Onset   Hypertension Mother        family history   Atrial fibrillation Mother    Cancer Mother        Lung   Cancer Father        NHL   Multiple sclerosis Sister    Colon cancer Paternal Aunt    Anemia Other        FE deficiency    Arthritis Other        family history   Cancer Other        colon 1st degree relative <60   Colon polyps Neg Hx    Esophageal cancer Neg Hx    Stomach cancer Neg Hx  Rectal cancer Neg Hx      Current Outpatient Medications:    acetaminophen (TYLENOL) 500 MG tablet, Take 1,000 mg by mouth every 6 (six) hours as needed for mild pain., Disp: , Rfl:    albuterol (PROVENTIL) (2.5 MG/3ML) 0.083% nebulizer solution, Take 3 mLs (2.5 mg total) by nebulization every 4 (four) hours as needed for wheezing or shortness of breath., Disp: 75 mL, Rfl: 12   albuterol (PROVENTIL) (2.5 MG/3ML) 0.083% nebulizer solution, Take 3 mLs (2.5 mg total) by nebulization every 4 (four) hours as needed for wheezing or shortness of breath., Disp: 75 mL, Rfl: 12   albuterol (VENTOLIN HFA) 108 (90 Base) MCG/ACT inhaler, Inhale 2 puffs into the lungs every 4 (four) hours as needed for wheezing or shortness of breath., Disp: 18 g, Rfl: 11   amoxicillin-clavulanate (AUGMENTIN) 875-125 MG tablet, Take 1 tablet by mouth 2 (two) times daily., Disp: 20 tablet, Rfl: 0   benzonatate (TESSALON) 200 MG capsule, Take 1 capsule (200 mg  total) by mouth 3 (three) times daily as needed for cough., Disp: 60 capsule, Rfl: 0   budesonide-formoterol (SYMBICORT) 160-4.5 MCG/ACT inhaler, Inhale 2 puffs into the lungs 2 (two) times daily., Disp: 10.2 g, Rfl: 12   cyanocobalamin (,VITAMIN B-12,) 1000 MCG/ML injection, Inject 1,000 mcg into the muscle every 14 (fourteen) days., Disp: , Rfl:    enoxaparin (LOVENOX) 60 MG/0.6ML injection, Inject 0.6 mLs (60 mg total) into the skin every 12 (twelve) hours., Disp: 6 mL, Rfl: 0   methylPREDNISolone (MEDROL DOSEPAK) 4 MG TBPK tablet, As directed, Disp: 21 tablet, Rfl: 0   nitrofurantoin, macrocrystal-monohydrate, (MACROBID) 100 MG capsule, Take 1 capsule (100 mg total) by mouth 2 (two) times daily., Disp: 14 capsule, Rfl: 0   ondansetron (ZOFRAN) 8 MG tablet, Take 1 tablet (8 mg total) by mouth every 6 (six) hours as needed for nausea or vomiting., Disp: 60 tablet, Rfl: 1   rivaroxaban (XARELTO) 20 MG TABS tablet, TAKE 1 TABLET BY MOUTH DAILY WITH SUPPER, Disp: 30 tablet, Rfl: 2   sertraline (ZOLOFT) 50 MG tablet, Take 1 tablet (50 mg total) by mouth daily., Disp: 30 tablet, Rfl: 2   sodium chloride HYPERTONIC 3 % nebulizer solution, Take by nebulization as needed for other., Disp: 750 mL, Rfl: 12  Current Facility-Administered Medications:    cyanocobalamin ((VITAMIN B-12)) injection 1,000 mcg, 1,000 mcg, Intramuscular, Once, Laurey Morale, MD  EXAM:  VITALS per patient if applicable:  GENERAL: alert, oriented, coughing frequently   HEENT: atraumatic, conjunttiva clear, no obvious abnormalities on inspection of external nose and ears  NECK: normal movements of the head and neck  LUNGS: on inspection no signs of respiratory distress, breathing rate appears normal, no obvious gross SOB, gasping or wheezing  CV: no obvious cyanosis  MS: moves all visible extremities without noticeable abnormality  PSYCH/NEURO: pleasant and cooperative, no obvious depression or anxiety, speech and  thought processing grossly intact  ASSESSMENT AND PLAN: Covid infection with a possible bronchitis. Treat with 10 days of Augmentin and a Medrol dose pack. She is out of the treatment window to use an antiviral medication. Follow up as needed.  Alysia Penna, MD  Discussed the following assessment and plan:  No diagnosis found.     I discussed the assessment and treatment plan with the patient. The patient was provided an opportunity to ask questions and all were answered. The patient agreed with the plan and demonstrated an understanding of the instructions.   The patient was  advised to call back or seek an in-person evaluation if the symptoms worsen or if the condition fails to improve as anticipated.      Review of Systems     Objective:   Physical Exam        Assessment & Plan:

## 2022-08-28 ENCOUNTER — Ambulatory Visit: Payer: Medicare HMO | Admitting: Gastroenterology

## 2022-09-20 ENCOUNTER — Encounter: Payer: Self-pay | Admitting: Family Medicine

## 2022-09-21 NOTE — Telephone Encounter (Signed)
Error/njr °

## 2022-09-24 ENCOUNTER — Ambulatory Visit (INDEPENDENT_AMBULATORY_CARE_PROVIDER_SITE_OTHER): Payer: Medicare HMO | Admitting: Family Medicine

## 2022-09-24 ENCOUNTER — Encounter: Payer: Self-pay | Admitting: Family Medicine

## 2022-09-24 VITALS — BP 130/82 | HR 88 | Temp 97.7°F | Wt 144.2 lb

## 2022-09-24 DIAGNOSIS — U071 COVID-19: Secondary | ICD-10-CM | POA: Diagnosis not present

## 2022-09-24 DIAGNOSIS — E538 Deficiency of other specified B group vitamins: Secondary | ICD-10-CM

## 2022-09-24 DIAGNOSIS — C3491 Malignant neoplasm of unspecified part of right bronchus or lung: Secondary | ICD-10-CM

## 2022-09-24 DIAGNOSIS — Z23 Encounter for immunization: Secondary | ICD-10-CM | POA: Diagnosis not present

## 2022-09-24 LAB — VITAMIN B12: Vitamin B-12: 773 pg/mL (ref 211–911)

## 2022-09-24 NOTE — Progress Notes (Signed)
   Subjective:    Patient ID: [name], female    DOB: 1946/04/08, 76 y.o.   MRN: [id number]  HPI Here to follow up from a recent Covid-19 infection. We saw her virtually on 08-20-22 where she had tested positive, and we covered her for any secondary infections with Augmentin. She recovered well and now she says she is back at her baseline. Her oxygen sats at home range from 94-98%.    Review of Systems  Constitutional:  Positive for fatigue.  Respiratory:  Positive for cough, shortness of breath and wheezing.   Cardiovascular: Negative.        Objective:   Physical Exam Constitutional:      Appearance: Normal appearance. She is not ill-appearing.  Cardiovascular:     Rate and Rhythm: Normal rate and regular rhythm.     Pulses: Normal pulses.     Heart sounds: Normal heart sounds.  Pulmonary:     Effort: Pulmonary effort is normal.     Breath sounds: Wheezing present. No rhonchi or rales.  Neurological:     Mental Status: She is alert.           Assessment & Plan:  She has recovered from a Covid infection. She is given a flu shot today. I also recommneded she get the newest Covid vaccine and the RSV vaccine in the near future.  Alysia Penna, MD

## 2022-09-24 NOTE — Addendum Note (Signed)
Addended by: Octavio Manns E on: 09/24/2022 01:42 PM   Modules accepted: Orders

## 2022-10-16 ENCOUNTER — Ambulatory Visit (INDEPENDENT_AMBULATORY_CARE_PROVIDER_SITE_OTHER): Payer: Medicare HMO | Admitting: Family Medicine

## 2022-10-16 ENCOUNTER — Encounter: Payer: Self-pay | Admitting: Family Medicine

## 2022-10-16 VITALS — BP 112/80 | Temp 97.9°F | Wt 144.1 lb

## 2022-10-16 DIAGNOSIS — R051 Acute cough: Secondary | ICD-10-CM | POA: Diagnosis not present

## 2022-10-16 DIAGNOSIS — C3490 Malignant neoplasm of unspecified part of unspecified bronchus or lung: Secondary | ICD-10-CM

## 2022-10-16 DIAGNOSIS — J40 Bronchitis, not specified as acute or chronic: Secondary | ICD-10-CM

## 2022-10-16 DIAGNOSIS — J471 Bronchiectasis with (acute) exacerbation: Secondary | ICD-10-CM

## 2022-10-16 DIAGNOSIS — R509 Fever, unspecified: Secondary | ICD-10-CM

## 2022-10-16 LAB — POC COVID19 BINAXNOW: SARS Coronavirus 2 Ag: NEGATIVE

## 2022-10-16 LAB — POCT INFLUENZA A/B
Influenza A, POC: NEGATIVE
Influenza B, POC: NEGATIVE

## 2022-10-16 MED ORDER — METHYLPREDNISOLONE ACETATE 40 MG/ML IJ SUSP
40.0000 mg | Freq: Once | INTRAMUSCULAR | Status: AC
  Administered 2022-10-16: 40 mg via INTRAMUSCULAR

## 2022-10-16 MED ORDER — HYDROCODONE BIT-HOMATROP MBR 5-1.5 MG/5ML PO SOLN: 5.0000 mL | ORAL | 0 refills | Status: DC | PRN

## 2022-10-16 MED ORDER — AMOXICILLIN-POT CLAVULANATE 875-125 MG PO TABS: 1.0000 | ORAL_TABLET | Freq: Two times a day (BID) | ORAL | 0 refills | Status: DC

## 2022-10-16 MED ORDER — METHYLPREDNISOLONE ACETATE 80 MG/ML IJ SUSP
80.0000 mg | Freq: Once | INTRAMUSCULAR | Status: AC
  Administered 2022-10-16: 80 mg via INTRAMUSCULAR

## 2022-10-16 NOTE — Progress Notes (Signed)
   Subjective:    Patient ID: [name], female    DOB: 13-Oct-1946, 76 y.o.   MRN: [id number]  HPI Here for 4 days of chest congestion, coughing up yellow sputum, mild chest pains, and SOB. No fever. She cannot use her nebulizer because she cannot stop coughing. Benzonatate is not helping. She has tested negative for Covid and flu. Her oxygen sats at home have ranged from 91 to 94%.    Review of Systems  Constitutional: Negative.   HENT: Negative.    Eyes: Negative.   Respiratory:  Positive for cough, chest tightness, shortness of breath and wheezing.   Cardiovascular:  Positive for chest pain. Negative for palpitations and leg swelling.  Gastrointestinal: Negative.        Objective:   Physical Exam Constitutional:      Comments: Coughing frequently   Cardiovascular:     Rate and Rhythm: Normal rate and regular rhythm.     Pulses: Normal pulses.     Heart sounds: Normal heart sounds.  Pulmonary:     Effort: No respiratory distress.     Breath sounds: Wheezing and rhonchi present. No rales.  Lymphadenopathy:     Cervical: No cervical adenopathy.  Neurological:     Mental Status: She is alert.           Assessment & Plan:  Bronchitis treat with 10 days of Augmentin. Given a shot of DepoMedrol. She can also use Hycodan syrup as needed for cough.I suggested she take a dose of Hycodan about 15 minutes before she tries to use her nebulizer. Recheck as needed.  Alysia Penna, MD

## 2022-10-16 NOTE — Addendum Note (Signed)
Addended by: Wyvonne Lenz on: 10/16/2022 04:59 PM   Modules accepted: Orders

## 2022-10-22 ENCOUNTER — Telehealth: Payer: Self-pay | Admitting: Family Medicine

## 2022-10-22 NOTE — Telephone Encounter (Addendum)
Pt called to say the antibiotics are not working and she still has a very bad cough..  Pt is asking for something a little stronger?  [place], [place] - [address] Phone: [phone]  Fax: [phone]

## 2022-10-22 NOTE — Telephone Encounter (Signed)
Please advise 

## 2022-10-23 ENCOUNTER — Other Ambulatory Visit: Payer: Self-pay

## 2022-10-23 DIAGNOSIS — R051 Acute cough: Secondary | ICD-10-CM

## 2022-10-23 MED ORDER — LEVOFLOXACIN 500 MG PO TABS: 500.0000 mg | ORAL_TABLET | Freq: Every day | ORAL | 0 refills | Status: AC

## 2022-10-23 NOTE — Telephone Encounter (Signed)
Called patient to inform, prescription for Levaquin 500 mg has been  sent to [facility] on [address].

## 2022-10-23 NOTE — Telephone Encounter (Signed)
Call in Levaquin 500 mg daily for 10 days  

## 2022-12-01 ENCOUNTER — Other Ambulatory Visit: Payer: Self-pay | Admitting: Family Medicine

## 2022-12-01 DIAGNOSIS — Z86711 Personal history of pulmonary embolism: Secondary | ICD-10-CM

## 2022-12-07 ENCOUNTER — Ambulatory Visit (INDEPENDENT_AMBULATORY_CARE_PROVIDER_SITE_OTHER): Payer: Medicare HMO | Admitting: Internal Medicine

## 2022-12-07 ENCOUNTER — Encounter: Payer: Self-pay | Admitting: Internal Medicine

## 2022-12-07 VITALS — BP 112/77 | HR 90 | Temp 98.2°F | Ht 65.0 in | Wt 141.6 lb

## 2022-12-07 DIAGNOSIS — H1131 Conjunctival hemorrhage, right eye: Secondary | ICD-10-CM | POA: Diagnosis not present

## 2022-12-07 NOTE — Progress Notes (Signed)
[facility]: [phone]   Routine Medical Office Visit  Patient:  [name]      Age: 77 y.o.       Sex:  female  Date:   12/07/2022  PCP:    [name], MD   Today's Healthcare Provider: Lula Olszewski, MD   Problem Focused Charting:   Medical Decision Making per Assessment/Plan   Ronniesha was seen today for bloodshot eye.  Subconjunctival hemorrhage of right eye   Thoroughly documented  today   Seems secondary to sneezing on Xarelto  Advised just use basic eye drops for comfort and rinsing conjunctiva  Advised continue Xarelto, avoid sneezing (better allergy control)  Advised keep checking vision on right and look in mirror for empyema, but none present now so unlikely to need opthalmological consultation  Gentle compresses for comfort ok.  AVS handout given   Today's key discussion points and After Visit Summary (AVS) reminders. Problem-associated medical records were reviewed with her during the appointment We discussed red flag symptoms and signs in detail and when to call the office or go to ER if her condition worsens (see AVS). She expressed understanding and AVS is used to reinforce.  This entire medical encounter document is also available on MyChart for her to review for accuracy and understanding.  Review of this document is encouraged in the AVS.    Subjective - Clinical Presentation:   [name] is a 77 y.o. female  Patient Active Problem List   Diagnosis Date Noted   Depression with anxiety 04/18/2022   Pulmonary embolism (HCC) 02/04/2022   Acute hemorrhagic colitis 02/04/2022   Aortic atherosclerosis (HCC) 02/04/2022   Neuropathy 09/20/2021   Bronchiectasis (HCC) 11/11/2017   PMR (polymyalgia rheumatica) (HCC) 10/31/2017   Cough, persistent 07/23/2017   Personal history of pulmonary embolism 01/29/2013   Acute pulmonary emboli, bilateral, with shortness of breath and back pain 01/22/2013   Non-small cell lung  cancer status post chemotherapy and radiation therapy 01/22/2013   Squamous cell lung cancer (HCC) 08/20/2012   Hypokalemia 05/27/2012   Diplopia 05/26/2012   SHOULDER PAIN 12/21/2010   FOOT PAIN 04/18/2010   BACK PAIN, LUMBAR 12/26/2009   B12 deficiency 09/27/2009   Hyperlipidemia 09/13/2009   Anemia, unspecified 09/13/2009   Unspecified glaucoma 09/13/2009   Essential hypertension 09/13/2009   DEEP VENOUS THROMBOPHLEBITIS, RECURRENT 09/13/2009   DVT, HX OF 09/13/2009   Past Medical History:  Diagnosis Date   Anemia    Cancer (HCC)    Cataract    Clotting disorder (HCC)    Community acquired pneumonia 01/22/2013   Depression with anxiety 04/18/2022   DVT (deep venous thrombosis) (HCC)    had 2 in right calf & 1 in the left calf   Dyspnea    nothing new, hx lung cancer   Fibromyalgia    Hyperlipidemia    Hypertension    no current problems no meds since cancer tx   Peripheral vascular disease (HCC)    PMR (polymyalgia rheumatica) (HCC) 10/31/2017   PONV (postoperative nausea and vomiting)    slow to wake up from anesthesia   Squamous cell lung cancer (HCC) 11/27/2011   sees Dr. Lendell Caprice at [facility] and Dr. Michail Sermon at [facility]    Tobacco abuse    quit smoking in 2013    Outpatient Medications Prior to Visit  Medication Sig   acetaminophen (TYLENOL) 500 MG tablet Take 1,000 mg by mouth every 6 (six)  hours as needed for mild pain.   albuterol (PROVENTIL) (2.5 MG/3ML) 0.083% nebulizer solution Take 3 mLs (2.5 mg total) by nebulization every 4 (four) hours as needed for wheezing or shortness of breath.   albuterol (PROVENTIL) (2.5 MG/3ML) 0.083% nebulizer solution Take 3 mLs (2.5 mg total) by nebulization every 4 (four) hours as needed for wheezing or shortness of breath.   albuterol (VENTOLIN HFA) 108 (90 Base) MCG/ACT inhaler Inhale 2 puffs into the lungs every 4 (four) hours as needed for wheezing or shortness of breath.   AREXVY 120 MCG/0.5ML  injection    budesonide-formoterol (SYMBICORT) 160-4.5 MCG/ACT inhaler Inhale 2 puffs into the lungs 2 (two) times daily.   COMIRNATY syringe    cyanocobalamin (,VITAMIN B-12,) 1000 MCG/ML injection Inject 1,000 mcg into the muscle every 14 (fourteen) days.   ondansetron (ZOFRAN) 8 MG tablet Take 1 tablet (8 mg total) by mouth every 6 (six) hours as needed for nausea or vomiting.   sodium chloride HYPERTONIC 3 % nebulizer solution Take by nebulization as needed for other.   XARELTO 20 MG TABS tablet TAKE 1 TABLET BY MOUTH ONCE DAILY WITH SUPPER   amoxicillin-clavulanate (AUGMENTIN) 875-125 MG tablet Take 1 tablet by mouth 2 (two) times daily. (Patient not taking: Reported on 12/07/2022)   benzonatate (TESSALON) 200 MG capsule Take 1 capsule (200 mg total) by mouth 3 (three) times daily as needed for cough. (Patient not taking: Reported on 12/07/2022)   enoxaparin (LOVENOX) 60 MG/0.6ML injection Inject 0.6 mLs (60 mg total) into the skin every 12 (twelve) hours. (Patient not taking: Reported on 12/07/2022)   HYDROcodone bit-homatropine (HYCODAN) 5-1.5 MG/5ML syrup Take 5 mLs by mouth every 4 (four) hours as needed for cough. (Patient not taking: Reported on 12/07/2022)   nitrofurantoin, macrocrystal-monohydrate, (MACROBID) 100 MG capsule Take 1 capsule (100 mg total) by mouth 2 (two) times daily. (Patient not taking: Reported on 12/07/2022)   sertraline (ZOLOFT) 50 MG tablet Take 1 tablet (50 mg total) by mouth daily. (Patient not taking: Reported on 12/07/2022)   Facility-Administered Medications Prior to Visit  Medication Dose Route Frequency Provider   cyanocobalamin ((VITAMIN B-12)) injection 1,000 mcg  1,000 mcg Intramuscular Once Laurey Morale, MD    Chief Complaint  Patient presents with   Bloodshot eye    Right eye-woke up today bruised with blood, sore/painful eye and is sensitive to light. Is on blood thinners. Denies having injured it.    HPI  Woke up with red eye- blood in eye (has  image) On Xarelto for history 4 blood clots. Denies any trauma to the eye but has been sneezing hard. Never had this happen before Eye pain is maybe 4/10 worsened by everting the lid and feels like a grain of sand is in her eye He seems slightly worse on the right and with testing it is 20/40 on the right and 20/35 on the left History of headache it the past month on the right for the past month mostly posterior right side of head starting at nape of neck.  Vital signs normal History of lung cancer without surgery that was treated with chemo and radiation 10 years ago and left the right lung somewhat collapsed Physical exam with images taken shows a severe subconjunctival hemorrhage          Objective:  Physical Exam  BP 112/77 (BP Location: Left Arm, Patient Position: Sitting)   Pulse 90   Temp 98.2 F (36.8 C) (Temporal)   Ht 5\' 5"  (  1.651 m)   Wt 141 lb 9.6 oz (64.2 kg)   SpO2 98%   BMI 23.56 kg/m  Well developed, well nourished  by BMI criteria but truncal adiposity (waist circumference or caliper) should be used instead. Wt Readings from Last 10 Encounters:  12/07/22 141 lb 9.6 oz (64.2 kg)  10/16/22 144 lb 2 oz (65.4 kg)  09/24/22 144 lb 4 oz (65.4 kg)  07/13/22 144 lb 4.8 oz (65.5 kg)  06/21/22 144 lb (65.3 kg)  06/01/22 144 lb (65.3 kg)  05/09/22 141 lb 8 oz (64.2 kg)  04/27/22 140 lb (63.5 kg)  04/06/22 144 lb (65.3 kg)  02/14/22 144 lb (65.3 kg)   Vital signs reviewed.  Nursing notes reviewed. Weight trend reviewed. General Appearance:  Well developed, well nourished female in no acute distress.   Normal work of breathing at rest Musculoskeletal: All extremities are intact.  Neurological:  Awake, alert,  No obvious focal neurological deficits or cognitive impairments Psychiatric:  Appropriate mood, pleasant demeanor Problem-specific findings:  conjunctival hemorrhage noted R eye see image , there is no ciliary flush, pupils are round and reactive , there is slight  opacification that appears in replaced in lenses but not the cornea. There are no corneal white spots or hypopyons but she is quite light sensitive  First image is off her phone, from waking up with this am, other 2 are in office          Signed: Lula Olszewski, MD 12/07/2022 11:26 AM

## 2022-12-20 DIAGNOSIS — R519 Headache, unspecified: Secondary | ICD-10-CM | POA: Diagnosis not present

## 2022-12-20 DIAGNOSIS — R1310 Dysphagia, unspecified: Secondary | ICD-10-CM | POA: Diagnosis not present

## 2022-12-20 DIAGNOSIS — C3481 Malignant neoplasm of overlapping sites of right bronchus and lung: Secondary | ICD-10-CM | POA: Diagnosis not present

## 2022-12-20 DIAGNOSIS — C3490 Malignant neoplasm of unspecified part of unspecified bronchus or lung: Secondary | ICD-10-CM | POA: Diagnosis not present

## 2022-12-20 DIAGNOSIS — R42 Dizziness and giddiness: Secondary | ICD-10-CM | POA: Diagnosis not present

## 2022-12-20 DIAGNOSIS — E7800 Pure hypercholesterolemia, unspecified: Secondary | ICD-10-CM | POA: Diagnosis not present

## 2022-12-20 DIAGNOSIS — Z87891 Personal history of nicotine dependence: Secondary | ICD-10-CM | POA: Diagnosis not present

## 2022-12-20 DIAGNOSIS — H539 Unspecified visual disturbance: Secondary | ICD-10-CM | POA: Diagnosis not present

## 2022-12-24 ENCOUNTER — Other Ambulatory Visit: Payer: Self-pay | Admitting: Family Medicine

## 2023-01-14 ENCOUNTER — Telehealth: Payer: Self-pay | Admitting: Family Medicine

## 2023-01-14 ENCOUNTER — Other Ambulatory Visit: Payer: Self-pay

## 2023-01-14 ENCOUNTER — Other Ambulatory Visit: Payer: Self-pay | Admitting: Family Medicine

## 2023-01-14 DIAGNOSIS — Z86711 Personal history of pulmonary embolism: Secondary | ICD-10-CM

## 2023-01-14 MED ORDER — RIVAROXABAN 20 MG PO TABS: 20.0000 mg | ORAL_TABLET | Freq: Every day | ORAL | 1 refills | Status: DC

## 2023-01-14 NOTE — Telephone Encounter (Signed)
Pt Rx sent for 90 days supply, pt is aware

## 2023-01-14 NOTE — Telephone Encounter (Signed)
Pt call and want a call back about her xarelto she want to know why he stop calling in a 90 day supply and want a call back.

## 2023-01-23 ENCOUNTER — Encounter: Payer: Self-pay | Admitting: Family Medicine

## 2023-01-23 ENCOUNTER — Ambulatory Visit (INDEPENDENT_AMBULATORY_CARE_PROVIDER_SITE_OTHER): Payer: Medicare HMO | Admitting: Family Medicine

## 2023-01-23 VITALS — BP 118/82 | Temp 97.5°F | Wt 142.8 lb

## 2023-01-23 DIAGNOSIS — J479 Bronchiectasis, uncomplicated: Secondary | ICD-10-CM

## 2023-01-23 DIAGNOSIS — Z86711 Personal history of pulmonary embolism: Secondary | ICD-10-CM

## 2023-01-23 DIAGNOSIS — R053 Chronic cough: Secondary | ICD-10-CM

## 2023-01-23 DIAGNOSIS — C3490 Malignant neoplasm of unspecified part of unspecified bronchus or lung: Secondary | ICD-10-CM

## 2023-01-23 DIAGNOSIS — E538 Deficiency of other specified B group vitamins: Secondary | ICD-10-CM | POA: Diagnosis not present

## 2023-01-23 DIAGNOSIS — I10 Essential (primary) hypertension: Secondary | ICD-10-CM

## 2023-01-23 DIAGNOSIS — R739 Hyperglycemia, unspecified: Secondary | ICD-10-CM | POA: Diagnosis not present

## 2023-01-23 MED ORDER — CYANOCOBALAMIN 1000 MCG/ML IJ SOLN
1000.0000 ug | Freq: Once | INTRAMUSCULAR | Status: AC
  Administered 2023-01-23: 1000 ug via INTRAMUSCULAR

## 2023-01-23 NOTE — Progress Notes (Addendum)
   Subjective:    Patient ID: [name], female    DOB: 12-12-1945, 77 y.o.   MRN: [id number]  HPI Here to discuss her constant fatigue and the recent development of a burning sensation and extra sensitivity in the scalp. This started about a month ago. Wearing her wig has been uncomfortable. He saw her oncologist at [facility], Dr. Wilnette Kales, on 12-20-22, and she ordered a brain MRI. This was unremarkable, showing only aging changes. She had been taking B12 shots every 2 weeks until last September when she ran out. The RX could not be refilled until she had a blood level checked. Unfortunately she had a number of health issues come up last fall and winter, so she never obtained any more.    Review of Systems  Constitutional:  Positive for fatigue.  Respiratory:  Positive for cough and shortness of breath. Negative for wheezing.   Cardiovascular: Negative.        Objective:   Physical Exam Constitutional:      Appearance: Normal appearance.     Comments: She coughs occasionally   Cardiovascular:     Rate and Rhythm: Normal rate and regular rhythm.     Pulses: Normal pulses.     Heart sounds: Normal heart sounds.  Pulmonary:     Effort: Pulmonary effort is normal.     Breath sounds: Rhonchi present. No wheezing or rales.  Neurological:     Mental Status: She is alert.           Assessment & Plan:  Her lungs seem to be stable from her hx of lung cancer and her hx of PE.  Her HTN is well controlled. I am certain her H08 level has dropped low again, and this is likely the cause of her fatigue and the scalp sensations. This is likely a form of neuropathy in the scalp. We will get labs today including a B12 level. We will give her a B12 shot today. Check an A1c today for her hx of hyperglycemia. She also has not seen her pulmonologist at [facility], Dr. Loni Muse, since January 2023. We will try to get her in with him asap. We spent a total of (  32 ) minutes reviewing records and  discussing these issues.  Alysia Penna, MD

## 2023-01-23 NOTE — Addendum Note (Signed)
Addended by: Wyvonne Lenz on: 01/23/2023 04:59 PM   Modules accepted: Orders

## 2023-01-24 ENCOUNTER — Telehealth: Payer: Self-pay

## 2023-01-24 LAB — HEPATIC FUNCTION PANEL
ALT: 11 U/L (ref 0–35)
AST: 18 U/L (ref 0–37)
Albumin: 4.1 g/dL (ref 3.5–5.2)
Alkaline Phosphatase: 55 U/L (ref 39–117)
Bilirubin, Direct: 0.1 mg/dL (ref 0.0–0.3)
Total Bilirubin: 0.5 mg/dL (ref 0.2–1.2)
Total Protein: 7.4 g/dL (ref 6.0–8.3)

## 2023-01-24 LAB — BASIC METABOLIC PANEL
BUN: 16 mg/dL (ref 6–23)
CO2: 26 mEq/L (ref 19–32)
Calcium: 10.3 mg/dL (ref 8.4–10.5)
Chloride: 103 mEq/L (ref 96–112)
Creatinine, Ser: 0.89 mg/dL (ref 0.40–1.20)
GFR: 62.31 mL/min (ref >60.00)
Glucose, Bld: 72 mg/dL (ref 70–99)
Potassium: 4.7 mEq/L (ref 3.5–5.1)
Sodium: 140 mEq/L (ref 135–145)

## 2023-01-24 LAB — CBC WITH DIFFERENTIAL/PLATELET
Basophils Absolute: 0.1 10*3/uL (ref 0.0–0.1)
Basophils Relative: 1.2 % (ref 0.0–3.0)
Eosinophils Absolute: 0.2 10*3/uL (ref 0.0–0.7)
Eosinophils Relative: 3 % (ref 0.0–5.0)
HCT: 44.1 % (ref 36.0–46.0)
Hemoglobin: 15.3 g/dL — ABNORMAL HIGH (ref 12.0–15.0)
Lymphocytes Relative: 15.2 % (ref 12.0–46.0)
Lymphs Abs: 1 10*3/uL (ref 0.7–4.0)
MCHC: 34.6 g/dL (ref 30.0–36.0)
MCV: 87.2 fl (ref 78.0–100.0)
Monocytes Absolute: 0.5 10*3/uL (ref 0.1–1.0)
Monocytes Relative: 7.6 % (ref 3.0–12.0)
Neutro Abs: 4.8 10*3/uL (ref 1.4–7.7)
Neutrophils Relative %: 73 % (ref 43.0–77.0)
Platelets: 277 10*3/uL (ref 150.0–400.0)
RBC: 5.06 Mil/uL (ref 3.87–5.11)
RDW: 14 % (ref 11.5–15.5)
WBC: 6.5 10*3/uL (ref 4.0–10.5)

## 2023-01-24 LAB — HEMOGLOBIN A1C: Hgb A1c MFr Bld: 5.3 % (ref 4.6–6.5)

## 2023-01-24 LAB — VITAMIN B12: Vitamin B-12: 540 pg/mL (ref 211–911)

## 2023-01-24 LAB — TSH: TSH: 1.68 u[IU]/mL (ref 0.35–5.50)

## 2023-01-24 NOTE — Telephone Encounter (Signed)
Message was sent to PCP as Juluis Rainier

## 2023-02-20 ENCOUNTER — Telehealth: Payer: Self-pay | Admitting: Family Medicine

## 2023-02-20 NOTE — Telephone Encounter (Addendum)
Last OV/also B-12 labs-01/23/23.    Please advise

## 2023-02-20 NOTE — Telephone Encounter (Signed)
Please advise 

## 2023-02-20 NOTE — Telephone Encounter (Signed)
Patient wanting to clarify whether she should still get the vitamin B-12 injections. Was advised to have every  2 weeks but wants to confirm. Scheduled for 02/21/23

## 2023-02-21 ENCOUNTER — Ambulatory Visit (INDEPENDENT_AMBULATORY_CARE_PROVIDER_SITE_OTHER): Payer: Medicare HMO | Admitting: *Deleted

## 2023-02-21 DIAGNOSIS — E538 Deficiency of other specified B group vitamins: Secondary | ICD-10-CM

## 2023-02-21 MED ORDER — CYANOCOBALAMIN 1000 MCG/ML IJ SOLN
1000.0000 ug | Freq: Once | INTRAMUSCULAR | Status: AC
  Administered 2023-02-21: 1000 ug via INTRAMUSCULAR

## 2023-02-21 NOTE — Progress Notes (Signed)
Per orders of Dr. Fry, injection of B12 given by Noelani Harbach. Patient tolerated injection well. 

## 2023-02-21 NOTE — Telephone Encounter (Signed)
Yes she should get these every 14 days

## 2023-02-21 NOTE — Telephone Encounter (Signed)
Left pt a detailed message to call the office and schedule Nurse Visits for B12 Injections every 14 days

## 2023-02-26 NOTE — Telephone Encounter (Signed)
Pt has a Nurse visit appointment scheduled for 03/06/23

## 2023-03-05 ENCOUNTER — Encounter: Payer: Self-pay | Admitting: Family

## 2023-03-05 ENCOUNTER — Telehealth: Payer: Self-pay

## 2023-03-05 ENCOUNTER — Ambulatory Visit (INDEPENDENT_AMBULATORY_CARE_PROVIDER_SITE_OTHER): Payer: Medicare HMO | Admitting: Family

## 2023-03-05 ENCOUNTER — Ambulatory Visit (INDEPENDENT_AMBULATORY_CARE_PROVIDER_SITE_OTHER): Payer: Medicare HMO

## 2023-03-05 VITALS — BP 106/76 | HR 105 | Temp 98.0°F | Ht 65.0 in | Wt 136.3 lb

## 2023-03-05 DIAGNOSIS — J209 Acute bronchitis, unspecified: Secondary | ICD-10-CM

## 2023-03-05 DIAGNOSIS — R051 Acute cough: Secondary | ICD-10-CM | POA: Diagnosis not present

## 2023-03-05 DIAGNOSIS — J069 Acute upper respiratory infection, unspecified: Secondary | ICD-10-CM

## 2023-03-05 DIAGNOSIS — E538 Deficiency of other specified B group vitamins: Secondary | ICD-10-CM | POA: Diagnosis not present

## 2023-03-05 DIAGNOSIS — J439 Emphysema, unspecified: Secondary | ICD-10-CM | POA: Diagnosis not present

## 2023-03-05 MED ORDER — BENZONATATE 100 MG PO CAPS: 100.0000 mg | ORAL_CAPSULE | Freq: Two times a day (BID) | ORAL | 0 refills | Status: DC | PRN

## 2023-03-05 MED ORDER — DOXYCYCLINE HYCLATE 100 MG PO TABS: 100.0000 mg | ORAL_TABLET | Freq: Two times a day (BID) | ORAL | 0 refills | Status: DC

## 2023-03-05 MED ORDER — CYANOCOBALAMIN 1000 MCG/ML IJ SOLN
1000.0000 ug | Freq: Once | INTRAMUSCULAR | Status: AC
  Administered 2023-03-05: 1000 ug via INTRAMUSCULAR

## 2023-03-05 NOTE — Telephone Encounter (Signed)
Patinet was seen in office today and requested B-12 injection that was originally scheduled for tomorrow. Patient was given B-12 per Kandee Keen, NP

## 2023-03-06 ENCOUNTER — Ambulatory Visit: Payer: Medicare HMO

## 2023-03-07 ENCOUNTER — Telehealth: Payer: Self-pay | Admitting: Family Medicine

## 2023-03-07 NOTE — Telephone Encounter (Signed)
FYI Pt was seen at the office on 03/05/23 by Worthy Rancher FNP, pt had x-ray taken and was prescribed Doxycycline, pt is requesting to have Dr Clent Ridges review the x-ray and also advise on if she should continue taking antibiotic prescribed, states that she is not feeling any better, symptoms are cough congestion, productive phlegm greenish yellowish color, tightness in the rib cage when coughing. Denies SOB/fever. Pt has been scheduled for OV with Dr Clent Ridges on 03/08/23.

## 2023-03-07 NOTE — Telephone Encounter (Signed)
Noted I will see her tomorrow 

## 2023-03-07 NOTE — Telephone Encounter (Signed)
Pt call and want you to give her a call about med she is taken.

## 2023-03-07 NOTE — Progress Notes (Signed)
Acute Office Visit  Subjective:     Patient ID: [name], female    DOB: [birth year], 77 y.o.   MRN: [id number]  Chief Complaint  Patient presents with  . Hoarse    X1 week  . Shortness of Breath    Patient complains of shortness of breath, x1 week,  . Fatigue    Patient complains of fatigue, x1 week   . Cough    Patient complains of cough, Productive with yellowish sputum, x1 week     HPI Patient is in today with c/o cough, congestion, yellow-green phlegm production and generally not feeling well x 1 week. She has a history of lung cancer. Has been taking Tessalon Perles. Nonsmoker.   Review of Systems  Constitutional:  Positive for fever.  Respiratory:  Positive for cough, sputum production and shortness of breath. Negative for hemoptysis and wheezing.   All other systems reviewed and are negative.  Past Medical History:  Diagnosis Date  . Anemia   . Cancer   . Cataract   . Clotting disorder   . Community acquired pneumonia 01/22/2013  . Depression with anxiety 04/18/2022  . DVT (deep venous thrombosis)    had 2 in right calf & 1 in the left calf  . Dyspnea    nothing new, hx lung cancer  . Fibromyalgia   . Hyperlipidemia   . Hypertension    no current problems no meds since cancer tx  . Peripheral vascular disease   . PMR (polymyalgia rheumatica) 10/31/2017  . PONV (postoperative nausea and vomiting)    slow to wake up from anesthesia  . Squamous cell lung cancer 11/27/2011   sees Dr. Lendell Caprice at [facility] and Dr. Michail Sermon at [facility]   . Tobacco abuse    quit smoking in 2013    Social History   Socioeconomic History  . Marital status: Married    Spouse name: Mathis Fare  . Number of children: 3  . Years of education: Not on file  . Highest education level: Not on file  Occupational History  . Occupation: Retired Social worker.    Employer: UNEMPLOYED  Tobacco Use  . Smoking status: Former    Types: Cigarettes    Quit date:  05/23/2012    Years since quitting: 10.7  . Smokeless tobacco: Never  . Tobacco comments:    Smoking since age 66 yrs old, quit 05/23/12  Vaping Use  . Vaping Use: Never used  Substance and Sexual Activity  . Alcohol use: Yes    Alcohol/week: 4.0 standard drinks of alcohol    Types: 4 Glasses of wine per week  . Drug use: No  . Sexual activity: Not Currently    Birth control/protection: Post-menopausal  Other Topics Concern  . Not on file  Social History Narrative   ** Merged History Encounter ** Married.  Lives with husband in [place].  Independent of ADLs.      Right Handed    Lives in a one level home    Social Determinants of Health   Financial Resource Strain: Low Risk  (07/13/2022)   Overall Financial Resource Strain (CARDIA)   . Difficulty of Paying Living Expenses: Not hard at all  Food Insecurity: No Food Insecurity (07/13/2022)   Hunger Vital Sign   . Worried About Programme researcher, broadcasting/film/video in the Last Year: Never true   . Ran Out of Food in the Last Year: Never true  Transportation Needs: No Transportation Needs (07/13/2022)  PRAPARE - Transportation   . Lack of Transportation (Medical): No   . Lack of Transportation (Non-Medical): No  Physical Activity: Insufficiently Active (07/13/2022)   Exercise Vital Sign   . Days of Exercise per Week: 3 days   . Minutes of Exercise per Session: 30 min  Stress: No Stress Concern Present (07/13/2022)   [employer] of Occupational Health - Occupational Stress Questionnaire   . Feeling of Stress : Not at all  Social Connections: Moderately Integrated (07/13/2022)   Social Connection and Isolation Panel [NHANES]   . Frequency of Communication with Friends and Family: More than three times a week   . Frequency of Social Gatherings with Friends and Family: More than three times a week   . Attends Religious Services: Never   . Active Member of Clubs or Organizations: Yes   . Attends Banker Meetings: More than 4 times  per year   . Marital Status: Married  Catering manager Violence: Not At Risk (07/13/2022)   Humiliation, Afraid, Rape, and Kick questionnaire   . Fear of Current or Ex-Partner: No   . Emotionally Abused: No   . Physically Abused: No   . Sexually Abused: No    Past Surgical History:  Procedure Laterality Date  . APPENDECTOMY    . CHOLECYSTECTOMY    . COLONOSCOPY     11/28/09 repeat in 10 years  . ESOPHAGOGASTRODUODENOSCOPY  October 2013   severe esophagitis, done at [facility]   . KNEE ARTHROSCOPY     right  . LAMINECTOMY     lumbar  . PORTACATH PLACEMENT    . RADIOLOGY WITH ANESTHESIA N/A 01/02/2022   Procedure: MRI LUMBAR WITH AND WITHOUT WITH ANESTHESIA;  Surgeon: Radiologist, Medication, MD;  Location: [facility] OR;  Service: Radiology;  Laterality: N/A;  . TONSILLECTOMY    . VIDEO BRONCHOSCOPY  05/30/2012   Procedure: VIDEO BRONCHOSCOPY WITH FLUORO;  Surgeon: Storm Frisk, MD;  Location: [facility] ENDOSCOPY;  Service: Cardiopulmonary;  Laterality: N/A;    Family History  Problem Relation Age of Onset  . Hypertension Mother        family history  . Atrial fibrillation Mother   . Cancer Mother        Lung  . Cancer Father        NHL  . Multiple sclerosis Sister   . Colon cancer Paternal Aunt   . Anemia Other        FE deficiency   . Arthritis Other        family history  . Cancer Other        colon 1st degree relative <60  . Colon polyps Neg Hx   . Esophageal cancer Neg Hx   . Stomach cancer Neg Hx   . Rectal cancer Neg Hx     Allergies  Allergen Reactions  . Ciprofloxacin Other (See Comments)    Diffuse polyarthropathy, joint effusions, pain across UE and LE with Cipro 750 mg. Fluoroquinolone class avoidance recommended.   . Promethazine Other (See Comments)    Patient states was during a hospitalization was taken off but unsure of the reason  . Polymyxin B Other (See Comments)    Eye irritation  . Prednisone     Did NOT tolerate well  . Trimethoprim Other (See Comments)     Eye irritation   . Morphine And Related Other (See Comments)    Hard time waking up when taking morphine, codeine, and related medications    Current Outpatient Medications  on File Prior to Visit  Medication Sig Dispense Refill  . acetaminophen (TYLENOL) 500 MG tablet Take 1,000 mg by mouth every 6 (six) hours as needed for mild pain.    Marland Kitchen. albuterol (PROVENTIL) (2.5 MG/3ML) 0.083% nebulizer solution Take 3 mLs (2.5 mg total) by nebulization every 4 (four) hours as needed for wheezing or shortness of breath. 75 mL 12  . albuterol (PROVENTIL) (2.5 MG/3ML) 0.083% nebulizer solution Take 3 mLs (2.5 mg total) by nebulization every 4 (four) hours as needed for wheezing or shortness of breath. 75 mL 12  . albuterol (VENTOLIN HFA) 108 (90 Base) MCG/ACT inhaler INHALE 2 PUFFS BY MOUTH EVERY 4 HOURS AS NEEDED FOR WHEEZING OR SHORTNESS OF BREATH 18 g 1  . AREXVY 120 MCG/0.5ML injection     . budesonide-formoterol (SYMBICORT) 160-4.5 MCG/ACT inhaler Inhale 2 puffs into the lungs 2 (two) times daily. 10.2 g 12  . COMIRNATY syringe     . cyanocobalamin (,VITAMIN B-12,) 1000 MCG/ML injection Inject 1,000 mcg into the muscle every 14 (fourteen) days.    Marland Kitchen. enoxaparin (LOVENOX) 60 MG/0.6ML injection Inject 0.6 mLs (60 mg total) into the skin every 12 (twelve) hours. 6 mL 0  . ondansetron (ZOFRAN) 8 MG tablet Take 1 tablet (8 mg total) by mouth every 6 (six) hours as needed for nausea or vomiting. 60 tablet 1  . rivaroxaban (XARELTO) 20 MG TABS tablet Take 1 tablet (20 mg total) by mouth daily with supper. 90 tablet 1  . sertraline (ZOLOFT) 50 MG tablet Take 1 tablet (50 mg total) by mouth daily. 30 tablet 2  . sodium chloride HYPERTONIC 3 % nebulizer solution Take by nebulization as needed for other. 750 mL 12   Current Facility-Administered Medications on File Prior to Visit  Medication Dose Route Frequency Provider Last Rate Last Admin  . cyanocobalamin ((VITAMIN B-12)) injection 1,000 mcg  1,000 mcg  Intramuscular Once Nelwyn SalisburyFry, Stephen A, MD        BP 106/76 (BP Location: Left Arm, Patient Position: Sitting, Cuff Size: Normal)   Pulse (!) 105   Temp 98 F (36.7 C) (Oral)   Ht 5\' 5"  (1.651 m)   Wt 136 lb 4.8 oz (61.8 kg)   SpO2 97%   BMI 22.68 kg/m chart     Objective:    BP 106/76 (BP Location: Left Arm, Patient Position: Sitting, Cuff Size: Normal)   Pulse (!) 105   Temp 98 F (36.7 C) (Oral)   Ht 5\' 5"  (1.651 m)   Wt 136 lb 4.8 oz (61.8 kg)   SpO2 97%   BMI 22.68 kg/m    Physical Exam Vitals and nursing note reviewed.  Constitutional:      Appearance: She is well-developed and normal weight.  HENT:     Mouth/Throat:     Mouth: Mucous membranes are moist.     Pharynx: Oropharynx is clear.  Cardiovascular:     Rate and Rhythm: Normal rate and regular rhythm.  Pulmonary:     Effort: Pulmonary effort is normal. No tachypnea, accessory muscle usage or respiratory distress.     Breath sounds: Normal breath sounds. No stridor.  Chest:     Chest wall: Tenderness present. No deformity.  Musculoskeletal:        General: Normal range of motion.     Cervical back: Normal range of motion and neck supple.  Skin:    General: Skin is warm.  Neurological:     General: No focal deficit present.  Mental Status: She is alert.  Psychiatric:        Mood and Affect: Mood normal.        Behavior: Behavior normal.   No results found for any visits on 03/05/23.      Assessment & Plan:   Problem List Items Addressed This Visit     B12 deficiency   Other Visit Diagnoses     Acute cough    -  Primary   Relevant Orders   DG Chest 2 View (Completed)   Acute bronchitis, unspecified organism       Relevant Orders   DG Chest 2 View (Completed)   Upper respiratory tract infection, unspecified type           Meds ordered this encounter  Medications  . doxycycline (VIBRA-TABS) 100 MG tablet    Sig: Take 1 tablet (100 mg total) by mouth 2 (two) times daily.    Dispense:   20 tablet    Refill:  0  . benzonatate (TESSALON) 100 MG capsule    Sig: Take 1 capsule (100 mg total) by mouth 2 (two) times daily as needed for cough.    Dispense:  20 capsule    Refill:  0  . cyanocobalamin (VITAMIN B12) injection 1,000 mcg   Call the office if symptoms worsen or persist. Recheck as scheduled, pending xray and sooner as needed.  No follow-ups on file.  Eulis Foster, FNP

## 2023-03-08 ENCOUNTER — Encounter: Payer: Self-pay | Admitting: Family Medicine

## 2023-03-08 ENCOUNTER — Ambulatory Visit (INDEPENDENT_AMBULATORY_CARE_PROVIDER_SITE_OTHER): Payer: Medicare HMO | Admitting: Family Medicine

## 2023-03-08 VITALS — BP 96/68 | Temp 98.2°F | Wt 136.0 lb

## 2023-03-08 DIAGNOSIS — J40 Bronchitis, not specified as acute or chronic: Secondary | ICD-10-CM

## 2023-03-08 DIAGNOSIS — R0602 Shortness of breath: Secondary | ICD-10-CM | POA: Diagnosis not present

## 2023-03-08 LAB — POC COVID19 BINAXNOW: SARS Coronavirus 2 Ag: NEGATIVE

## 2023-03-08 MED ORDER — METHYLPREDNISOLONE ACETATE 40 MG/ML IJ SUSP
40.0000 mg | Freq: Once | INTRAMUSCULAR | Status: AC
Start: 2023-03-08 — End: 2023-03-08
  Administered 2023-03-08: 40 mg via INTRAMUSCULAR

## 2023-03-08 MED ORDER — METHYLPREDNISOLONE ACETATE 80 MG/ML IJ SUSP
80.0000 mg | Freq: Once | INTRAMUSCULAR | Status: AC
Start: 2023-03-08 — End: 2023-03-08
  Administered 2023-03-08: 80 mg via INTRAMUSCULAR

## 2023-03-08 NOTE — Addendum Note (Signed)
Addended by: Carola Rhine on: 03/08/2023 03:38 PM   Modules accepted: Orders

## 2023-03-08 NOTE — Progress Notes (Signed)
   Subjective:    Patient ID: [name], female    DOB: [birth year], 77 y.o.   MRN: [id number]  HPI Here to follow up on  bronchitis that started this past weekend. She has had a fever up to 101 degrees, and she is coughing up green sputum. She is a bit more SOB than usual. She is using her albuterol inhaler, but she cannot use her nebulizer because of the coughing. No chest pain. She was seen here 3 days ago and a CXR was obtained that showed no acute processes. She was given 10 days of Doxycycline and Benzonatate pills. She has not improved at all since then.    Review of Systems  Constitutional: Negative.   Respiratory:  Positive for cough, shortness of breath and wheezing.   Cardiovascular: Negative.        Objective:   Physical Exam Constitutional:      Appearance: She is ill-appearing.     Comments: Coughing frequently   Cardiovascular:     Rate and Rhythm: Normal rate and regular rhythm.     Pulses: Normal pulses.     Heart sounds: Normal heart sounds.  Pulmonary:     Effort: Pulmonary effort is normal.     Breath sounds: Wheezing and rhonchi present. No rales.  Musculoskeletal:     Right lower leg: No edema.     Left lower leg: No edema.  Lymphadenopathy:     Cervical: No cervical adenopathy.  Neurological:     Mental Status: She is alert.           Assessment & Plan:  Partially treated bronchitis. She will continue taking the Doxycycline. She is given a shot of DepoMedrol. Report back on Monday.  Gershon Crane, MD

## 2023-03-19 ENCOUNTER — Ambulatory Visit: Payer: Medicare HMO

## 2023-03-20 ENCOUNTER — Ambulatory Visit (INDEPENDENT_AMBULATORY_CARE_PROVIDER_SITE_OTHER): Payer: Medicare HMO

## 2023-03-20 DIAGNOSIS — E538 Deficiency of other specified B group vitamins: Secondary | ICD-10-CM | POA: Diagnosis not present

## 2023-03-20 MED ORDER — CYANOCOBALAMIN 1000 MCG/ML IJ SOLN
1000.0000 ug | Freq: Once | INTRAMUSCULAR | Status: AC
Start: 2023-03-20 — End: 2023-03-20
  Administered 2023-03-20: 1000 ug via INTRAMUSCULAR

## 2023-03-20 NOTE — Progress Notes (Signed)
Per orders of Dr. Fry, injection of Cyanocobalamin 1000 mcg given by Alieu Finnigan L Luiz Trumpower. °Patient tolerated injection well.  °

## 2023-04-03 ENCOUNTER — Ambulatory Visit (INDEPENDENT_AMBULATORY_CARE_PROVIDER_SITE_OTHER): Payer: Medicare HMO | Admitting: *Deleted

## 2023-04-03 DIAGNOSIS — E538 Deficiency of other specified B group vitamins: Secondary | ICD-10-CM

## 2023-04-03 MED ORDER — CYANOCOBALAMIN 1000 MCG/ML IJ SOLN
1000.0000 ug | Freq: Once | INTRAMUSCULAR | Status: AC
Start: 2023-04-03 — End: 2023-04-03
  Administered 2023-04-03: 1000 ug via INTRAMUSCULAR

## 2023-04-03 NOTE — Progress Notes (Signed)
Per orders of Dr. Fry, injection of Cyanocobalamin 1000mcg given by Kanija Remmel A. Patient tolerated injection well.  

## 2023-04-17 ENCOUNTER — Ambulatory Visit (INDEPENDENT_AMBULATORY_CARE_PROVIDER_SITE_OTHER): Payer: Medicare HMO

## 2023-04-17 ENCOUNTER — Telehealth: Payer: Self-pay | Admitting: Family Medicine

## 2023-04-17 ENCOUNTER — Ambulatory Visit: Payer: Medicare HMO

## 2023-04-17 DIAGNOSIS — E538 Deficiency of other specified B group vitamins: Secondary | ICD-10-CM | POA: Diagnosis not present

## 2023-04-17 MED ORDER — CYANOCOBALAMIN 1000 MCG/ML IJ SOLN
1000.0000 ug | Freq: Once | INTRAMUSCULAR | Status: AC
Start: 2023-04-17 — End: 2023-04-17
  Administered 2023-04-17: 1000 ug via INTRAMUSCULAR

## 2023-04-17 NOTE — Progress Notes (Signed)
Per orders of Dr. Fry, injection of Cyanocobalamin 1000 mcg given by Hope Holst L Ardie Mclennan. °Patient tolerated injection well.  °

## 2023-04-17 NOTE — Telephone Encounter (Signed)
Pt came in person and stated she would like to let Dr. Claris Che team know she has a bruise and lump on her left arm. She states she received her B12 shot two weeks ago and has had the bruise and lump since them. She denies any pain on her arm.

## 2023-04-18 NOTE — Telephone Encounter (Signed)
FYI. Noted

## 2023-05-01 ENCOUNTER — Ambulatory Visit (INDEPENDENT_AMBULATORY_CARE_PROVIDER_SITE_OTHER): Payer: Medicare HMO

## 2023-05-01 DIAGNOSIS — E538 Deficiency of other specified B group vitamins: Secondary | ICD-10-CM

## 2023-05-01 MED ORDER — CYANOCOBALAMIN 1000 MCG/ML IJ SOLN
1000.0000 ug | Freq: Once | INTRAMUSCULAR | Status: AC
Start: 2023-05-01 — End: 2023-05-01
  Administered 2023-05-01: 1000 ug via INTRAMUSCULAR

## 2023-05-01 NOTE — Progress Notes (Signed)
Pt here for monthly B12 injection per Dr Clent Ridges  B12 given IM and pt tolerated injection well.  Next B12 injection scheduled for 05/15/23

## 2023-05-15 ENCOUNTER — Ambulatory Visit (INDEPENDENT_AMBULATORY_CARE_PROVIDER_SITE_OTHER): Payer: Medicare HMO

## 2023-05-15 DIAGNOSIS — E538 Deficiency of other specified B group vitamins: Secondary | ICD-10-CM

## 2023-05-15 MED ORDER — CYANOCOBALAMIN 1000 MCG/ML IJ SOLN
1000.0000 ug | Freq: Once | INTRAMUSCULAR | Status: AC
Start: 2023-05-15 — End: 2023-05-15
  Administered 2023-05-15: 1000 ug via INTRAMUSCULAR

## 2023-05-15 NOTE — Progress Notes (Signed)
Pt here for monthly B12 injection per Dr Clent Ridges  B12 given IM and pt tolerated injection well.  Next B12 injection scheduled for 05/22/23

## 2023-05-29 ENCOUNTER — Ambulatory Visit (INDEPENDENT_AMBULATORY_CARE_PROVIDER_SITE_OTHER): Payer: Medicare HMO

## 2023-05-29 DIAGNOSIS — E538 Deficiency of other specified B group vitamins: Secondary | ICD-10-CM | POA: Diagnosis not present

## 2023-05-29 MED ORDER — CYANOCOBALAMIN 1000 MCG/ML IJ SOLN
1000.00 ug | Freq: Once | INTRAMUSCULAR | Status: AC
Start: 2023-05-29 — End: 2023-05-29
  Administered 2023-05-29: 1000 ug via INTRAMUSCULAR

## 2023-05-29 NOTE — Progress Notes (Signed)
Pt here for monthly B12 injection per Dr Clent Ridges  B12 given IM and pt tolerated injection well.  Next B12 injection scheduled for 06/12/23

## 2023-06-12 ENCOUNTER — Ambulatory Visit: Payer: Medicare HMO

## 2023-06-12 DIAGNOSIS — E538 Deficiency of other specified B group vitamins: Secondary | ICD-10-CM | POA: Diagnosis not present

## 2023-06-12 MED ORDER — CYANOCOBALAMIN 1000 MCG/ML IJ SOLN
1000.0000 ug | Freq: Once | INTRAMUSCULAR | Status: AC
Start: 2023-06-12 — End: 2023-06-12
  Administered 2023-06-12: 1000 ug via INTRAMUSCULAR

## 2023-06-12 NOTE — Patient Instructions (Signed)
Health Maintenance Due  Topic Date Due   Hepatitis C Screening  Never done   DTaP/Tdap/Td (1 - Tdap) Never done   Zoster Vaccines- Shingrix (1 of 2) 02/16/1965   COVID-19 Vaccine (6 - 2023-24 season) 01/02/2023   Medicare Annual Wellness (AWV)  07/14/2023       03/05/2023   11:37 AM 01/23/2023    4:58 PM 09/24/2022    1:43 PM  Depression screen PHQ 2/9  Decreased Interest 2 0 1  Down, Depressed, Hopeless 0 0 1  PHQ - 2 Score 2 0 2  Altered sleeping 2 0 2  Tired, decreased energy 3 0 2  Change in appetite 3 0 0  Feeling bad or failure about yourself  0 0 0  Trouble concentrating 1 0 0  Moving slowly or fidgety/restless 0 0 0  Suicidal thoughts 0 0 0  PHQ-9 Score 11 0 6  Difficult doing work/chores Somewhat difficult Not difficult at all Not difficult at all

## 2023-06-12 NOTE — Progress Notes (Signed)
Per orders of [name], MD, injection of B12 given in  Left deltoid by Sherrin Daisy. Patient tolerated injection well.  Lab Results  Component Value Date   VITAMINB12 540 01/23/2023

## 2023-06-25 ENCOUNTER — Ambulatory Visit (INDEPENDENT_AMBULATORY_CARE_PROVIDER_SITE_OTHER): Payer: Medicare HMO

## 2023-06-25 DIAGNOSIS — E538 Deficiency of other specified B group vitamins: Secondary | ICD-10-CM | POA: Diagnosis not present

## 2023-06-25 MED ORDER — CYANOCOBALAMIN 1000 MCG/ML IJ SOLN
1000.0000 ug | Freq: Once | INTRAMUSCULAR | Status: AC
Start: 2023-06-25 — End: 2023-06-25
  Administered 2023-06-25: 1000 ug via INTRAMUSCULAR

## 2023-06-25 NOTE — Progress Notes (Signed)
Pt here for bi-monthly B12 injection per Dr Clent Ridges.  B12 given IM and pt tolerated injection well.  Next B12 injection scheduled for 2 weeks.

## 2023-06-26 ENCOUNTER — Encounter (INDEPENDENT_AMBULATORY_CARE_PROVIDER_SITE_OTHER): Payer: Self-pay

## 2023-07-09 ENCOUNTER — Ambulatory Visit (INDEPENDENT_AMBULATORY_CARE_PROVIDER_SITE_OTHER): Payer: Medicare HMO

## 2023-07-09 DIAGNOSIS — E538 Deficiency of other specified B group vitamins: Secondary | ICD-10-CM | POA: Diagnosis not present

## 2023-07-09 MED ORDER — CYANOCOBALAMIN 1000 MCG/ML IJ SOLN
1000.0000 ug | Freq: Once | INTRAMUSCULAR | Status: AC
Start: 2023-07-09 — End: 2023-07-09
  Administered 2023-07-09: 1000 ug via INTRAMUSCULAR

## 2023-07-09 NOTE — Progress Notes (Signed)
Pt here for bi-monthly B12 injection per Dr. Clent Ridges.  B12 given IM and pt tolerated injection well.

## 2023-07-16 ENCOUNTER — Other Ambulatory Visit: Payer: Self-pay | Admitting: Family Medicine

## 2023-07-16 DIAGNOSIS — Z86711 Personal history of pulmonary embolism: Secondary | ICD-10-CM

## 2023-07-18 ENCOUNTER — Telehealth: Payer: Medicare HMO | Admitting: Family Medicine

## 2023-07-18 NOTE — Progress Notes (Signed)
PATIENT CHECK-IN and HEALTH RISK ASSESSMENT QUESTIONNAIRE:  -completed by phone/video for upcoming Medicare Preventive Visit  Pre-Visit Check-in: 1)Vitals (height, wt, BP, etc) - record in vitals section for visit on day of visit Request home vitals (wt, BP, etc.) and enter into vitals, THEN update Vital Signs SmartPhrase below at the top of the HPI. See below.  2)Review and Update Medications, Allergies PMH, Surgeries, Social history in Epic 3)Hospitalizations in the last year with date/reason? ***  4)Review and Update Care Team (patient's specialists) in Epic 5) Complete PHQ9 in Epic  6) Complete Fall Screening in Epic 7)Review all Health Maintenance Due and order under PCP if not done.  8)Medicare Wellness Questionnaire: Answer theses question about your habits: Do you drink alcohol? *** If yes, how many drinks do you have a day?*** Have you ever smoked?*** Quit date if applicable? ***  How many packs a day do/did you smoke? *** Do you use smokeless tobacco?*** Do you use an illicit drugs?*** Do you exercises? ***IF so, what type and how many days/minutes per week?*** Are you sexually active? ***Number of partners?*** Typical breakfast**** Typical lunch*** Typical dinner*** Typical snacks:****  Beverages: ***  Answer theses question about you: Can you perform most household chores?*** Do you find it hard to follow a conversation in a noisy room?*** Do you often ask people to speak up or repeat themselves?*** Do you feel that you have a problem with memory?*** Do you balance your checkbook and or bank acounts?*** Do you feel safe at home?*** Last dentist visit?*** Do you need assistance with any of the following: Please note if so ***  Driving?  Feeding yourself?  Getting from bed to chair?  Getting to the toilet?  Bathing or showering?  Dressing yourself?  Managing money?  Climbing a flight of stairs  Preparing meals?  Do you have Advanced Directives in place  (Living Will, Healthcare Power or Attorney)? ***   Last eye Exam and location?***   Do you currently use prescribed or non-prescribed narcotic or opioid pain medications?***  Do you have a history or close family history of breast, ovarian, tubal or peritoneal cancer or a family member with BRCA (breast cancer susceptibility 1 and 2) gene mutations?  ***Request home vitals (wt, BP, etc.) and enter into vitals, THEN update Vital Signs SmartPhrase below at the top of the HPI. See below.   Nurse/Assistant Credentials/time stamp:   ----------------------------------------------------------------------------------------------------------------------------------------------------------------------------------------------------------------------  Vital Signs: {telehealth vitals:30100}   MEDICARE ANNUAL PREVENTIVE VISIT WITH PROVIDER: (Welcome to [employer], initial annual wellness or annual wellness exam)  Virtual Visit via Video***Phone Note  I connected with [name] on 07/18/23 by phone *** a video enabled telemedicine application and verified that I am speaking with the correct person using two identifiers.  Location patient: home Location provider:work or home office Persons participating in the virtual visit: patient, provider  Concerns and/or follow up today:   See HM section in Epic for other details of completed HM.    ROS: negative for report of fevers, unintentional weight loss, vision changes, vision loss, hearing loss or change, chest pain, sob, hemoptysis, melena, hematochezia, hematuria, falls, bleeding or bruising, thoughts of suicide or self harm, memory loss  Patient-completed extensive health risk assessment - reviewed and discussed with the patient: See Health Risk Assessment completed with patient prior to the visit either above or in recent phone note. This was reviewed in detailed with the patient today and appropriate recommendations, orders and  referrals were placed as needed per Summary  below and patient instructions.   Review of Medical History: -PMH, PSH, Family History and current specialty and care providers reviewed and updated and listed below   Patient Care Team: [name], MD as PCP - General Glendale Chard, DO as Consulting Physician (Neurology)   Past Medical History:  Diagnosis Date   Anemia    Cancer [facility])    Cataract    Clotting disorder (HCC)    Community acquired pneumonia 01/22/2013   Depression with anxiety 04/18/2022   DVT (deep venous thrombosis) (HCC)    had 2 in right calf & 1 in the left calf   Dyspnea    nothing new, hx lung cancer   Fibromyalgia    Hyperlipidemia    Hypertension    no current problems no meds since cancer tx   Peripheral vascular disease (HCC)    PMR (polymyalgia rheumatica) (HCC) 10/31/2017   PONV (postoperative nausea and vomiting)    slow to wake up from anesthesia   Squamous cell lung cancer (HCC) 11/27/2011   sees Dr. Lendell Caprice at [facility] and Dr. Michail Sermon at [facility]    Tobacco abuse    quit smoking in 2013    Past Surgical History:  Procedure Laterality Date   APPENDECTOMY     CHOLECYSTECTOMY     COLONOSCOPY     11/28/09 repeat in 10 years   ESOPHAGOGASTRODUODENOSCOPY  October 2013   severe esophagitis, done at [facility]    KNEE ARTHROSCOPY     right   LAMINECTOMY     lumbar   PORTACATH PLACEMENT     RADIOLOGY WITH ANESTHESIA N/A 01/02/2022   Procedure: MRI LUMBAR WITH AND WITHOUT WITH ANESTHESIA;  Surgeon: Radiologist, Medication, MD;  Location: [facility] OR;  Service: Radiology;  Laterality: N/A;   TONSILLECTOMY     VIDEO BRONCHOSCOPY  05/30/2012   Procedure: VIDEO BRONCHOSCOPY WITH FLUORO;  Surgeon: Storm Frisk, MD;  Location: [facility] ENDOSCOPY;  Service: Cardiopulmonary;  Laterality: N/A;    Social History   Socioeconomic History   Marital status: Married    Spouse name: Mathis Fare   Number of children: 3   Years of education: Not on file    Highest education level: Not on file  Occupational History   Occupation: Retired Social worker.    Employer: UNEMPLOYED  Tobacco Use   Smoking status: Former    Current packs/day: 0.00    Types: Cigarettes    Quit date: 05/23/2012    Years since quitting: 11.1   Smokeless tobacco: Never   Tobacco comments:    Smoking since age 33 yrs old, quit 05/23/12  Vaping Use   Vaping status: Never Used  Substance and Sexual Activity   Alcohol use: Yes    Alcohol/week: 4.0 standard drinks of alcohol    Types: 4 Glasses of wine per week   Drug use: No   Sexual activity: Not Currently    Birth control/protection: Post-menopausal  Other Topics Concern   Not on file  Social History Narrative   ** Merged History Encounter ** Married.  Lives with husband in [place].  Independent of ADLs.      Right Handed    Lives in a one level home    Social Determinants of Health   Financial Resource Strain: Low Risk  (07/13/2022)   Overall Financial Resource Strain (CARDIA)    Difficulty of Paying Living Expenses: Not hard at all  Food Insecurity: No Food Insecurity (07/13/2022)   Hunger Vital Sign  Worried About Programme researcher, broadcasting/film/video in the Last Year: Never true    Ran Out of Food in the Last Year: Never true  Transportation Needs: No Transportation Needs (07/13/2022)   PRAPARE - Administrator, Civil Service (Medical): No    Lack of Transportation (Non-Medical): No  Physical Activity: Insufficiently Active (07/13/2022)   Exercise Vital Sign    Days of Exercise per Week: 3 days    Minutes of Exercise per Session: 30 min  Stress: No Stress Concern Present (07/13/2022)   [employer] of Occupational Health - Occupational Stress Questionnaire    Feeling of Stress : Not at all  Social Connections: Moderately Integrated (07/13/2022)   Social Connection and Isolation Panel [NHANES]    Frequency of Communication with Friends and Family: More than three times a week    Frequency of Social  Gatherings with Friends and Family: More than three times a week    Attends Religious Services: Never    Database administrator or Organizations: Yes    Attends Engineer, structural: More than 4 times per year    Marital Status: Married  Catering manager Violence: Not At Risk (07/13/2022)   Humiliation, Afraid, Rape, and Kick questionnaire    Fear of Current or Ex-Partner: No    Emotionally Abused: No    Physically Abused: No    Sexually Abused: No    Family History  Problem Relation Age of Onset   Hypertension Mother        family history   Atrial fibrillation Mother    Cancer Mother        Lung   Cancer Father        NHL   Multiple sclerosis Sister    Colon cancer Paternal Aunt    Anemia Other        FE deficiency    Arthritis Other        family history   Cancer Other        colon 1st degree relative <60   Colon polyps Neg Hx    Esophageal cancer Neg Hx    Stomach cancer Neg Hx    Rectal cancer Neg Hx     Current Outpatient Medications on File Prior to Visit  Medication Sig Dispense Refill   acetaminophen (TYLENOL) 500 MG tablet Take 1,000 mg by mouth every 6 (six) hours as needed for mild pain.     albuterol (PROVENTIL) (2.5 MG/3ML) 0.083% nebulizer solution Take 3 mLs (2.5 mg total) by nebulization every 4 (four) hours as needed for wheezing or shortness of breath. 75 mL 12   albuterol (PROVENTIL) (2.5 MG/3ML) 0.083% nebulizer solution Take 3 mLs (2.5 mg total) by nebulization every 4 (four) hours as needed for wheezing or shortness of breath. 75 mL 12   albuterol (VENTOLIN HFA) 108 (90 Base) MCG/ACT inhaler INHALE 2 PUFFS BY MOUTH EVERY 4 HOURS AS NEEDED FOR WHEEZING OR SHORTNESS OF BREATH 18 g 1   AREXVY 120 MCG/0.5ML injection      benzonatate (TESSALON) 100 MG capsule Take 1 capsule (100 mg total) by mouth 2 (two) times daily as needed for cough. 20 capsule 0   budesonide-formoterol (SYMBICORT) 160-4.5 MCG/ACT inhaler Inhale 2 puffs into the lungs 2 (two)  times daily. 10.2 g 12   COMIRNATY syringe      cyanocobalamin (,VITAMIN B-12,) 1000 MCG/ML injection Inject 1,000 mcg into the muscle every 14 (fourteen) days.     doxycycline (VIBRA-TABS) 100 MG tablet  Take 1 tablet (100 mg total) by mouth 2 (two) times daily. 20 tablet 0   enoxaparin (LOVENOX) 60 MG/0.6ML injection Inject 0.6 mLs (60 mg total) into the skin every 12 (twelve) hours. 6 mL 0   ondansetron (ZOFRAN) 8 MG tablet Take 1 tablet (8 mg total) by mouth every 6 (six) hours as needed for nausea or vomiting. 60 tablet 1   sertraline (ZOLOFT) 50 MG tablet Take 1 tablet (50 mg total) by mouth daily. 30 tablet 2   sodium chloride HYPERTONIC 3 % nebulizer solution Take by nebulization as needed for other. 750 mL 12   XARELTO 20 MG TABS tablet TAKE 1 TABLET BY MOUTH ONCE DAILY WITH SUPPER 90 tablet 0   Current Facility-Administered Medications on File Prior to Visit  Medication Dose Route Frequency Provider Last Rate Last Admin   cyanocobalamin ((VITAMIN B-12)) injection 1,000 mcg  1,000 mcg Intramuscular Once [name], MD        Allergies  Allergen Reactions   Ciprofloxacin Other (See Comments)    Diffuse polyarthropathy, joint effusions, pain across UE and LE with Cipro 750 mg. Fluoroquinolone class avoidance recommended.    Promethazine Other (See Comments)    Patient states was during a hospitalization was taken off but unsure of the reason   Polymyxin B Other (See Comments)    Eye irritation   Prednisone     Did NOT tolerate well   Trimethoprim Other (See Comments)    Eye irritation    Morphine And Codeine Other (See Comments)    Hard time waking up when taking morphine, codeine, and related medications       Physical Exam Vitals requested from patient and listed below if patient had equipment and was able to obtain at home for this virtual visit: There were no vitals filed for this visit. Estimated body mass index is 22.63 kg/m as calculated from the following:    Height as of 03/05/23: 5\' 5"  (1.651 m).   Weight as of 03/08/23: 136 lb (61.7 kg).  EKG (optional): deferred due to virtual visit  GENERAL: alert, oriented, no acute distress detected, full vision exam deferred due to pandemic and/or virtual encounter  *** HEENT: atraumatic, conjunttiva clear, no obvious abnormalities on inspection of external nose and ears  NECK: normal movements of the head and neck  LUNGS: on inspection no signs of respiratory distress, breathing rate appears normal, no obvious gross SOB, gasping or wheezing  CV: no obvious cyanosis  MS: moves all visible extremities without noticeable abnormality  PSYCH/NEURO: pleasant and cooperative, no obvious depression or anxiety, speech and thought processing grossly intact, Cognitive function grossly intact  Flowsheet Row Office Visit from 03/05/2023 in [facility]  PHQ-9 Total Score 11           03/05/2023   11:37 AM 01/23/2023    4:58 PM 09/24/2022    1:43 PM 07/13/2022    1:45 PM 05/09/2022   11:17 AM  Depression screen PHQ 2/9  Decreased Interest 2 0 1 0 2  Down, Depressed, Hopeless 0 0 1 0 2  PHQ - 2 Score 2 0 2 0 4  Altered sleeping 2 0 2  1  Tired, decreased energy 3 0 2  3  Change in appetite 3 0 0  2  Feeling bad or failure about yourself  0 0 0  0  Trouble concentrating 1 0 0  0  Moving slowly or fidgety/restless 0 0 0  0  Suicidal thoughts 0 0 0  0  PHQ-9 Score 11 0 6  10  Difficult doing work/chores Somewhat difficult Not difficult at all Not difficult at all  Somewhat difficult       07/13/2022    1:49 PM 08/07/2022    6:06 PM 09/24/2022    1:43 PM 01/23/2023    4:59 PM 03/05/2023   11:38 AM  Fall Risk  Falls in the past year? 0  1 0 0  Was there an injury with Fall? 0  1 0 0  Fall Risk Category Calculator 0  2 0 0  Fall Risk Category (Retired) Low  Moderate    (RETIRED) Patient Fall Risk Level Low fall risk Low fall risk Low fall risk    Patient at Risk for Falls  Due to No Fall Risks  No Fall Risks No Fall Risks No Fall Risks  Fall risk Follow up   Falls evaluation completed Falls evaluation completed Falls evaluation completed     SUMMARY AND PLAN:  No diagnosis found.  Visit coding *** [phone] (annual wellness visit -initial); G0439 (annual wellness subsequent); G0402 Welcome to Medicare(initial preventive physical exam)   Discussed applicable health maintenance/preventive health measures and advised and referred or ordered per patient preferences:  Health Maintenance  Topic Date Due   Hepatitis C Screening  Never done   DTaP/Tdap/Td (1 - Tdap) Never done   Zoster Vaccines- Shingrix (1 of 2) 02/16/1965   DEXA SCAN  Never done   COVID-19 Vaccine (6 - 2023-24 season) 01/02/2023   Medicare Annual Wellness (AWV)  07/14/2023   INFLUENZA VACCINE  06/27/2023   Pneumonia Vaccine 53+ Years old  Completed   HPV VACCINES  Aged Out   Colonoscopy  Discontinued     Vaccines   Mammogram   Screening Pap smear/pelvic exam   Lung Cancer Screening   Colorectal cancer screening   Osteoporosis screening if applicable  Screening for glaucoma  Statin use for primary prevention  Cardiovascular screening blood tests   Diabetes screening tests  Hepatitis B screening if applicable  HIV screening   Hepatitis C screening   Chlamydia/Gonorrhea screening  STI Screening   Syphilis Screening if applicable  Latent TB screening if applicable  Breast Cancer prevention medication if applicable  BRCA related risk assessment and referral for counseling if applicable  Education and counseling on the following was provided based on the above review of health and a plan/checklist for the patient, along with additional information discussed, was provided for the patient in the patient instructions :  -Advised on importance of completing advanced directives, discussed options for completing and provided information in patient instructions as  well -Provided counseling and plan for difficulty hearing  -Provided counseling and plan for increased risk of falling if applicable per above screening. Reviewed and demonstrated safe balance exercises that can be done at home to improve balance and discussed exercise guidelines for adults with include balance exercises at least 3 days per week.  -Advised and counseled on a healthy lifestyle - including the importance of a healthy diet, regular physical activity, social connections and stress management. -Reviewed patient's current diet. Advised and counseled on a whole foods based healthy diet. A summary of a healthy diet was provided in the Patient Instructions.  -reviewed patient's current physical activity level and discussed exercise guidelines for adults. Discussed community resources and ideas for safe exercise at home to assist in meeting exercise guideline recommendations in a safe and healthy way.  -Advise yearly  dental visits at minimum and regular eye exams -Advised and counseled on alcohol safe limits, risks/ tobacco use, risks of smoking and offered counseling/help, drug, opoid use/misuse   Follow up: see patient instructions     There are no Patient Instructions on file for this visit.  Terressa Koyanagi, DO

## 2023-07-23 ENCOUNTER — Telehealth: Payer: Self-pay | Admitting: Family Medicine

## 2023-07-23 ENCOUNTER — Ambulatory Visit (INDEPENDENT_AMBULATORY_CARE_PROVIDER_SITE_OTHER): Payer: Medicare HMO

## 2023-07-23 DIAGNOSIS — E538 Deficiency of other specified B group vitamins: Secondary | ICD-10-CM

## 2023-07-23 MED ORDER — CYANOCOBALAMIN 1000 MCG/ML IJ SOLN
1000.0000 ug | Freq: Once | INTRAMUSCULAR | Status: AC
Start: 2023-07-23 — End: 2023-07-23
  Administered 2023-07-23: 1000 ug via INTRAMUSCULAR

## 2023-07-23 NOTE — Progress Notes (Signed)
Pt here for monthly B12 injection per Dr Clent Ridges  B12 given IM and pt tolerated injection well.  Next B12 injection scheduled for 08/05/13

## 2023-07-23 NOTE — Telephone Encounter (Signed)
Pt needs a refill on Albuterol 0.083 % Nebulizer solution and Sodium Chloride Hypertonic 3 % nebulizer solution.  Pt is completely out.  Pharmacy - [facility] on [address]

## 2023-07-24 ENCOUNTER — Other Ambulatory Visit: Payer: Self-pay

## 2023-07-24 MED ORDER — SODIUM CHLORIDE 3 % IN NEBU: INHALATION_SOLUTION | RESPIRATORY_TRACT | 12 refills | Status: AC | PRN

## 2023-07-24 NOTE — Telephone Encounter (Signed)
Sent!

## 2023-07-25 ENCOUNTER — Other Ambulatory Visit: Payer: Self-pay

## 2023-07-25 MED ORDER — ALBUTEROL SULFATE (2.5 MG/3ML) 0.083% IN NEBU: 2.5000 mg | INHALATION_SOLUTION | RESPIRATORY_TRACT | 12 refills | Status: DC | PRN

## 2023-07-25 NOTE — Telephone Encounter (Signed)
Sent refill to pt pharmacy pt notified

## 2023-07-25 NOTE — Telephone Encounter (Signed)
Albuterol 0.083 % Nebulizer solution  has not been called in.  Sodium Chloride ws called in.  Patient needed both.   Please give patient a call back.

## 2023-07-25 NOTE — Telephone Encounter (Signed)
Pt called, returning CMA's call. CMA was with a patient. Pt asked that CMA call back at his earliest convenience. 

## 2023-08-06 ENCOUNTER — Ambulatory Visit (INDEPENDENT_AMBULATORY_CARE_PROVIDER_SITE_OTHER): Payer: Medicare HMO

## 2023-08-06 DIAGNOSIS — Z23 Encounter for immunization: Secondary | ICD-10-CM

## 2023-08-06 DIAGNOSIS — E538 Deficiency of other specified B group vitamins: Secondary | ICD-10-CM | POA: Diagnosis not present

## 2023-08-06 MED ORDER — CYANOCOBALAMIN 1000 MCG/ML IJ SOLN
1000.0000 ug | Freq: Once | INTRAMUSCULAR | Status: AC
Start: 2023-08-06 — End: 2023-08-06
  Administered 2023-08-06: 1000 ug via INTRAMUSCULAR

## 2023-08-06 NOTE — Progress Notes (Signed)
Per orders of Dr. Clent Ridges , injection of B-12 and Flu vaccine  given by Stann Ore. Patient tolerated injection well.

## 2023-08-07 DIAGNOSIS — R9389 Abnormal findings on diagnostic imaging of other specified body structures: Secondary | ICD-10-CM | POA: Diagnosis not present

## 2023-08-07 DIAGNOSIS — C3491 Malignant neoplasm of unspecified part of right bronchus or lung: Secondary | ICD-10-CM | POA: Diagnosis not present

## 2023-08-07 DIAGNOSIS — R918 Other nonspecific abnormal finding of lung field: Secondary | ICD-10-CM | POA: Diagnosis not present

## 2023-08-07 DIAGNOSIS — C3490 Malignant neoplasm of unspecified part of unspecified bronchus or lung: Secondary | ICD-10-CM | POA: Diagnosis not present

## 2023-08-20 ENCOUNTER — Ambulatory Visit (INDEPENDENT_AMBULATORY_CARE_PROVIDER_SITE_OTHER): Payer: Medicare HMO

## 2023-08-20 DIAGNOSIS — E538 Deficiency of other specified B group vitamins: Secondary | ICD-10-CM | POA: Diagnosis not present

## 2023-08-20 MED ORDER — CYANOCOBALAMIN 1000 MCG/ML IJ SOLN
1000.0000 ug | Freq: Once | INTRAMUSCULAR | Status: AC
Start: 2023-08-20 — End: 2023-08-20
  Administered 2023-08-20: 1000 ug via INTRAMUSCULAR

## 2023-08-20 NOTE — Progress Notes (Signed)
Per orders of Dr.Fry , injection of B12  given by Stann Ore. Patient tolerated injection well.

## 2023-09-03 ENCOUNTER — Ambulatory Visit: Payer: Medicare HMO

## 2023-09-12 ENCOUNTER — Ambulatory Visit (INDEPENDENT_AMBULATORY_CARE_PROVIDER_SITE_OTHER): Payer: Medicare HMO | Admitting: Family Medicine

## 2023-09-12 ENCOUNTER — Encounter: Payer: Self-pay | Admitting: Family Medicine

## 2023-09-12 VITALS — BP 110/70 | HR 62 | Temp 97.8°F | Wt 136.0 lb

## 2023-09-12 DIAGNOSIS — I10 Essential (primary) hypertension: Secondary | ICD-10-CM | POA: Diagnosis not present

## 2023-09-12 DIAGNOSIS — C3491 Malignant neoplasm of unspecified part of right bronchus or lung: Secondary | ICD-10-CM | POA: Diagnosis not present

## 2023-09-12 DIAGNOSIS — R053 Chronic cough: Secondary | ICD-10-CM | POA: Diagnosis not present

## 2023-09-12 DIAGNOSIS — E538 Deficiency of other specified B group vitamins: Secondary | ICD-10-CM | POA: Diagnosis not present

## 2023-09-12 MED ORDER — CYANOCOBALAMIN 1000 MCG/ML IJ SOLN
1000.0000 ug | Freq: Once | INTRAMUSCULAR | Status: AC
Start: 2023-09-12 — End: 2023-09-12
  Administered 2023-09-12: 1000 ug via INTRAMUSCULAR

## 2023-09-12 MED ORDER — BUDESONIDE-FORMOTEROL FUMARATE 160-4.5 MCG/ACT IN AERO: 2.0000 | INHALATION_SPRAY | Freq: Two times a day (BID) | RESPIRATORY_TRACT | 12 refills | Status: AC

## 2023-09-12 NOTE — Progress Notes (Signed)
Subjective:    Patient ID: [name], female    DOB: 1946-07-19, 77 y.o.   MRN: [id number]  HPI Here to follow up on a recent CT scan. She continues to see Dr. Lendell Caprice at [facility] and Dr. Allena Katz for Pulmonology. She saw Dr. Nedra Hai on 08-07-23, and she had a chest CT that same day. The CT showed an increase in size of a "clustered nodularity" in the posterior right base. Consequently Dr. Nedra Hai wanted to get a 3 month follow up scan. She is now scheduled for another chest CT on 11-28-23. In general she feels about like usual with some SOB in exertion and a frequent dry cough. She has not been sick this summer or fall. She is up to date on flu shots, pneumonia shots, and Covid shots.    Review of Systems  Constitutional: Negative.   Respiratory:  Positive for cough and shortness of breath. Negative for wheezing.   Cardiovascular: Negative.        Objective:   Physical Exam Constitutional:      Appearance: Normal appearance. She is not ill-appearing.  Cardiovascular:     Rate and Rhythm: Normal rate and regular rhythm.     Pulses: Normal pulses.     Heart sounds: Normal heart sounds.  Pulmonary:     Effort: Pulmonary effort is normal.     Breath sounds: No wheezing or rales.     Comments: There is consolidation and rhonchi in the RLL Neurological:     Mental Status: She is alert.           Assessment & Plan:  Her SOB and chronic cough are stable, but she had some signs of increased activity in the RLL on a recent chest CT. She will get the follow up scan as above. I also advised her to get the RSV vaccine.  Gershon Crane, MD

## 2023-09-12 NOTE — Addendum Note (Signed)
Addended by: Gershon Crane A on: 09/12/2023 10:19 AM   Modules accepted: Orders

## 2023-09-30 ENCOUNTER — Ambulatory Visit: Payer: Medicare HMO | Admitting: Family Medicine

## 2023-09-30 ENCOUNTER — Ambulatory Visit: Payer: Medicare HMO

## 2023-09-30 ENCOUNTER — Encounter: Payer: Self-pay | Admitting: Family Medicine

## 2023-09-30 ENCOUNTER — Ambulatory Visit (INDEPENDENT_AMBULATORY_CARE_PROVIDER_SITE_OTHER): Payer: Medicare HMO

## 2023-09-30 VITALS — BP 120/68 | HR 92 | Temp 98.0°F | Ht 65.0 in | Wt 138.8 lb

## 2023-09-30 DIAGNOSIS — H1033 Unspecified acute conjunctivitis, bilateral: Secondary | ICD-10-CM | POA: Diagnosis not present

## 2023-09-30 DIAGNOSIS — Z86711 Personal history of pulmonary embolism: Secondary | ICD-10-CM

## 2023-09-30 DIAGNOSIS — J811 Chronic pulmonary edema: Secondary | ICD-10-CM | POA: Diagnosis not present

## 2023-09-30 DIAGNOSIS — Z7901 Long term (current) use of anticoagulants: Secondary | ICD-10-CM

## 2023-09-30 DIAGNOSIS — M79621 Pain in right upper arm: Secondary | ICD-10-CM

## 2023-09-30 DIAGNOSIS — C3490 Malignant neoplasm of unspecified part of unspecified bronchus or lung: Secondary | ICD-10-CM | POA: Diagnosis not present

## 2023-09-30 DIAGNOSIS — R600 Localized edema: Secondary | ICD-10-CM | POA: Diagnosis not present

## 2023-09-30 DIAGNOSIS — R221 Localized swelling, mass and lump, neck: Secondary | ICD-10-CM

## 2023-09-30 DIAGNOSIS — E538 Deficiency of other specified B group vitamins: Secondary | ICD-10-CM | POA: Diagnosis not present

## 2023-09-30 DIAGNOSIS — C3491 Malignant neoplasm of unspecified part of right bronchus or lung: Secondary | ICD-10-CM

## 2023-09-30 DIAGNOSIS — C761 Malignant neoplasm of thorax: Secondary | ICD-10-CM | POA: Diagnosis not present

## 2023-09-30 DIAGNOSIS — R053 Chronic cough: Secondary | ICD-10-CM | POA: Diagnosis not present

## 2023-09-30 DIAGNOSIS — R059 Cough, unspecified: Secondary | ICD-10-CM | POA: Diagnosis not present

## 2023-09-30 MED ORDER — CYANOCOBALAMIN 1000 MCG/ML IJ SOLN
1000.0000 ug | Freq: Once | INTRAMUSCULAR | Status: AC
  Administered 2023-09-30: 1000 ug via INTRAMUSCULAR

## 2023-09-30 MED ORDER — AZITHROMYCIN 1 % OP SOLN
OPHTHALMIC | 0 refills | Status: DC
Start: 2023-09-30 — End: 2023-10-02

## 2023-09-30 NOTE — Progress Notes (Signed)
Established Patient Office Visit   Subjective  Patient ID: [name], female    DOB: Jun 28, 1946  Age: 77 y.o. MRN: [id number]  Chief Complaint  Patient presents with   Conjunctivitis    Patient came in today for pink eye started 2 day ago, and right hand swelling, B-12 injection     Patient is a 77 year old female followed by Dr. Clent Ridges and seen for acute concern.  Patient endorses irritation, erythema, discharge of bilateral eyes x 1 day.  Patient also endorses heaviness in right R, tenderness in right axilla, and right hand edema.  Patient also feels like there is a discomfort in the right upper chest radiating to the back.  Pt with a history of lung cancer seen at [facility].  Also with history of DVT/PE on Xarelto.  Patient requesting a B12 injection.    Patient Active Problem List   Diagnosis Date Noted   Depression with anxiety 04/18/2022   Pulmonary embolism (HCC) 02/04/2022   Acute hemorrhagic colitis 02/04/2022   Aortic atherosclerosis (HCC) 02/04/2022   Neuropathy 09/20/2021   Bronchiectasis (HCC) 11/11/2017   PMR (polymyalgia rheumatica) (HCC) 10/31/2017   Cough, persistent 07/23/2017   Personal history of pulmonary embolism 01/29/2013   Acute pulmonary emboli, bilateral, with shortness of breath and back pain 01/22/2013   Non-small cell lung cancer status post chemotherapy and radiation therapy 01/22/2013   Squamous cell lung cancer (HCC) 08/20/2012   Hypokalemia 05/27/2012   Diplopia 05/26/2012   SHOULDER PAIN 12/21/2010   FOOT PAIN 04/18/2010   BACK PAIN, LUMBAR 12/26/2009   B12 deficiency 09/27/2009   Hyperlipidemia 09/13/2009   Anemia, unspecified 09/13/2009   Unspecified glaucoma 09/13/2009   Essential hypertension 09/13/2009   DEEP VENOUS THROMBOPHLEBITIS, RECURRENT 09/13/2009   DVT, HX OF 09/13/2009   Past Medical History:  Diagnosis Date   Anemia    Cancer (HCC)    Cataract    Clotting disorder (HCC)    Community acquired pneumonia 01/22/2013    Depression with anxiety 04/18/2022   DVT (deep venous thrombosis) (HCC)    had 2 in right calf & 1 in the left calf   Dyspnea    nothing new, hx lung cancer   Fibromyalgia    Hyperlipidemia    Hypertension    no current problems no meds since cancer tx   Peripheral vascular disease (HCC)    PMR (polymyalgia rheumatica) (HCC) 10/31/2017   PONV (postoperative nausea and vomiting)    slow to wake up from anesthesia   Squamous cell lung cancer (HCC) 11/27/2011   sees Dr. Lendell Caprice at [facility] and Dr. Michail Sermon at [facility]    Tobacco abuse    quit smoking in 2013   Past Surgical History:  Procedure Laterality Date   APPENDECTOMY     CHOLECYSTECTOMY     COLONOSCOPY     11/28/09 repeat in 10 years   ESOPHAGOGASTRODUODENOSCOPY  October 2013   severe esophagitis, done at [facility]    KNEE ARTHROSCOPY     right   LAMINECTOMY     lumbar   PORTACATH PLACEMENT     RADIOLOGY WITH ANESTHESIA N/A 01/02/2022   Procedure: MRI LUMBAR WITH AND WITHOUT WITH ANESTHESIA;  Surgeon: Radiologist, Medication, MD;  Location: [facility] OR;  Service: Radiology;  Laterality: N/A;   TONSILLECTOMY     VIDEO BRONCHOSCOPY  05/30/2012   Procedure: VIDEO BRONCHOSCOPY WITH FLUORO;  Surgeon: Storm Frisk, MD;  Location: [facility] ENDOSCOPY;  Service: Cardiopulmonary;  Laterality:  N/A;   Social History   Tobacco Use   Smoking status: Former    Current packs/day: 0.00    Types: Cigarettes    Quit date: 05/23/2012    Years since quitting: 11.3   Smokeless tobacco: Never   Tobacco comments:    Smoking since age 44 yrs old, quit 05/23/12  Vaping Use   Vaping status: Never Used  Substance Use Topics   Alcohol use: Yes    Alcohol/week: 4.0 standard drinks of alcohol    Types: 4 Glasses of wine per week   Drug use: No   Family History  Problem Relation Age of Onset   Hypertension Mother        family history   Atrial fibrillation Mother    Cancer Mother        Lung   Cancer Father        NHL    Multiple sclerosis Sister    Colon cancer Paternal Aunt    Anemia Other        FE deficiency    Arthritis Other        family history   Cancer Other        colon 1st degree relative <60   Colon polyps Neg Hx    Esophageal cancer Neg Hx    Stomach cancer Neg Hx    Rectal cancer Neg Hx    Allergies  Allergen Reactions   Ciprofloxacin Other (See Comments)    Diffuse polyarthropathy, joint effusions, pain across UE and LE with Cipro 750 mg. Fluoroquinolone class avoidance recommended.    Promethazine Other (See Comments)    Patient states was during a hospitalization was taken off but unsure of the reason   Polymyxin B Other (See Comments)    Eye irritation   Prednisone     Did NOT tolerate well   Trimethoprim Other (See Comments)    Eye irritation    Morphine And Codeine Other (See Comments)    Hard time waking up when taking morphine, codeine, and related medications      ROS Negative unless stated above    Objective:     BP 120/68 (BP Location: Left Arm, Patient Position: Sitting, Cuff Size: Normal)   Pulse 92   Temp 98 F (36.7 C) (Oral)   Ht 5\' 5"  (1.651 m)   Wt 138 lb 12.8 oz (63 kg)   SpO2 96%   BMI 23.10 kg/m  BP Readings from Last 3 Encounters:  09/30/23 120/68  09/12/23 110/70  03/08/23 96/68   Wt Readings from Last 3 Encounters:  09/30/23 138 lb 12.8 oz (63 kg)  09/12/23 136 lb (61.7 kg)  03/08/23 136 lb (61.7 kg)      Physical Exam Constitutional:      Appearance: Normal appearance.  HENT:     Head: Normocephalic and atraumatic.     Nose: Nose normal.     Mouth/Throat:     Mouth: Mucous membranes are moist.  Eyes:     Extraocular Movements: Extraocular movements intact.     Conjunctiva/sclera:     Right eye: Right conjunctiva is injected.     Left eye: Left conjunctiva is injected.  Cardiovascular:     Rate and Rhythm: Normal rate and regular rhythm.     Pulses: Normal pulses.     Heart sounds: Normal heart sounds.  Pulmonary:      Effort: Pulmonary effort is normal.     Breath sounds: Normal breath sounds.  Comments: Intermittent deep cough Skin:    General: Skin is warm and dry.     Comments: Mild edema right hand.  TTP right axilla.  Neurological:     Mental Status: She is alert and oriented to person, place, and time.      No results found for any visits on 09/30/23.    Assessment & Plan:  Edema of hand -Concern for blood clot despite Xarelto use, vascular stenosis causing acute symptoms.  Also consider lymphadenopathy -IConsider ultrasound RUE. -     DG Chest 2 View  Acute bacterial conjunctivitis of both eyes -Patient with allergy to treatment options bacterial conjunctivitis including polymyxin, trimethoprim. -Initially prescription for azithromycin eyedrops sent to pharmacy however required prior Auth.  Second prescription for erythromycin ointment sent to pharmacy.  Squamous cell carcinoma of right lung (HCC) -     DG Chest 2 View  Chronic cough -     DG Chest 2 View  Personal history of pulmonary embolism -Continue Xarelto  Chronic anticoagulation -Xarelto use 2/2 history of PE/DVT  Pain in right axilla -     DG Chest 2 View  Neck swelling -     DG Chest 2 View  Vitamin B12 deficiency -regular B12 injections per chart review. -     Cyanocobalamin    Return in about 1 week (around 10/07/2023) for with pcp to follow up on ongoing issues.Deeann Saint, MD

## 2023-10-01 ENCOUNTER — Telehealth: Payer: Self-pay | Admitting: Family Medicine

## 2023-10-01 ENCOUNTER — Telehealth: Payer: Self-pay

## 2023-10-01 NOTE — Telephone Encounter (Signed)
Pt states she say Dr. Salomon Fick on 11/4 and was prescribed Azithromycin (Azasite) 1% ophthalmic solution. However, her insurance does not cover this prescription and it would be over $300. She is requesting for something else to be called in for her.

## 2023-10-02 ENCOUNTER — Telehealth: Payer: Self-pay

## 2023-10-02 ENCOUNTER — Other Ambulatory Visit (HOSPITAL_COMMUNITY): Payer: Self-pay

## 2023-10-02 MED ORDER — ERYTHROMYCIN 5 MG/GM OP OINT: TOPICAL_OINTMENT | OPHTHALMIC | 0 refills | Status: DC

## 2023-10-02 MED ORDER — TOBRAMYCIN-DEXAMETHASONE 0.3-0.1 % OP SUSP: 2.0000 [drp] | OPHTHALMIC | 0 refills | Status: DC

## 2023-10-02 NOTE — Telephone Encounter (Signed)
Pharmacy Patient Advocate Encounter   Received notification from Pt Calls Messages that prior authorization for AzaSite 1% solution is required/requested.   Insurance verification completed.   The patient is insured through [employer] .   Per test claim: PA required; PA submitted to above mentioned insurance via CoverMyMeds Key/confirmation #/[id number] Status is pending

## 2023-10-02 NOTE — Telephone Encounter (Signed)
PA request has been Submitted. New Encounter created for follow up. For additional info see Pharmacy Prior Auth telephone encounter from 10/02/23.

## 2023-10-02 NOTE — Telephone Encounter (Signed)
She had been prescribed Azasite drops for her eyes but it is too expensive. We cancelled that and instead sent in generic Tobradex drops.

## 2023-10-02 NOTE — Telephone Encounter (Signed)
Sending in erythromycin ointment to pharmacy, with PCP approval. Patient allergic to other alternatives  Sherrill Raring, PharmD Clinical Pharmacist [phone]

## 2023-11-06 ENCOUNTER — Other Ambulatory Visit: Payer: Self-pay | Admitting: Family Medicine

## 2023-11-06 DIAGNOSIS — Z86711 Personal history of pulmonary embolism: Secondary | ICD-10-CM

## 2023-11-26 ENCOUNTER — Other Ambulatory Visit: Payer: Self-pay | Admitting: Family

## 2023-11-28 DIAGNOSIS — K449 Diaphragmatic hernia without obstruction or gangrene: Secondary | ICD-10-CM | POA: Diagnosis not present

## 2023-11-28 DIAGNOSIS — J701 Chronic and other pulmonary manifestations due to radiation: Secondary | ICD-10-CM | POA: Diagnosis not present

## 2023-11-28 DIAGNOSIS — M47814 Spondylosis without myelopathy or radiculopathy, thoracic region: Secondary | ICD-10-CM | POA: Diagnosis not present

## 2023-11-28 DIAGNOSIS — R918 Other nonspecific abnormal finding of lung field: Secondary | ICD-10-CM | POA: Diagnosis not present

## 2023-11-28 DIAGNOSIS — C3481 Malignant neoplasm of overlapping sites of right bronchus and lung: Secondary | ICD-10-CM | POA: Diagnosis not present

## 2023-11-28 DIAGNOSIS — J9819 Other pulmonary collapse: Secondary | ICD-10-CM | POA: Diagnosis not present

## 2023-11-28 DIAGNOSIS — I2510 Atherosclerotic heart disease of native coronary artery without angina pectoris: Secondary | ICD-10-CM | POA: Diagnosis not present

## 2023-11-28 DIAGNOSIS — J432 Centrilobular emphysema: Secondary | ICD-10-CM | POA: Diagnosis not present

## 2023-12-11 ENCOUNTER — Encounter: Payer: Self-pay | Admitting: Family Medicine

## 2023-12-11 ENCOUNTER — Ambulatory Visit (INDEPENDENT_AMBULATORY_CARE_PROVIDER_SITE_OTHER): Payer: Medicare Other | Admitting: Family Medicine

## 2023-12-11 ENCOUNTER — Ambulatory Visit: Payer: Medicare Other

## 2023-12-11 VITALS — BP 120/80 | HR 87 | Temp 97.9°F | Wt 140.0 lb

## 2023-12-11 DIAGNOSIS — R42 Dizziness and giddiness: Secondary | ICD-10-CM | POA: Diagnosis not present

## 2023-12-11 DIAGNOSIS — E538 Deficiency of other specified B group vitamins: Secondary | ICD-10-CM | POA: Diagnosis not present

## 2023-12-11 DIAGNOSIS — R053 Chronic cough: Secondary | ICD-10-CM

## 2023-12-11 DIAGNOSIS — J189 Pneumonia, unspecified organism: Secondary | ICD-10-CM | POA: Diagnosis not present

## 2023-12-11 MED ORDER — CYANOCOBALAMIN 1000 MCG/ML IJ SOLN
1000.0000 ug | Freq: Once | INTRAMUSCULAR | Status: AC
  Administered 2023-12-11: 1000 ug via INTRAMUSCULAR

## 2023-12-11 MED ORDER — BENZONATATE 100 MG PO CAPS: 100.0000 mg | ORAL_CAPSULE | Freq: Four times a day (QID) | ORAL | 5 refills | Status: AC

## 2023-12-11 NOTE — Addendum Note (Signed)
 Addended by: Philbert Brave on: 12/11/2023 03:48 PM   Modules accepted: Orders

## 2023-12-11 NOTE — Progress Notes (Signed)
   Subjective:    Patient ID: [name], female    DOB: Apr 29, 1946, 78 y.o.   MRN: [id number]  HPI Here to follow up on a pneumonia and to discuss some lightheadedness she has had for the past 3 days. She saw her [facility] on 11-28-23, and she told them that she had been coughing up bloody sputum for several days. They got another chest CT and this showed no acute changes. She had biapical emphysema and a collapsed RML with fibrosis. She also had the 0.4 cm RUL nodule that we have been following. She was felt to have an atypical pneumonia, and she was treated with Cefpodoxime and Azithromycin . Since then the cough has calmed down and the blood sputum has stopped. In addition she describes some mild lightheadedness for the past few days. This is most noticeable when she stands up from a sitting or lying position, but she feels a milder version all the time.    Review of Systems  Constitutional: Negative.   Respiratory:  Positive for cough and shortness of breath. Negative for wheezing and stridor.   Cardiovascular: Negative.   Neurological:  Positive for light-headedness. Negative for dizziness, tremors, seizures, syncope, facial asymmetry, speech difficulty, weakness, numbness and headaches.       Objective:   Physical Exam Constitutional:      Appearance: Normal appearance. She is not ill-appearing.  Cardiovascular:     Rate and Rhythm: Normal rate and regular rhythm.     Pulses: Normal pulses.     Heart sounds: Normal heart sounds.  Pulmonary:     Effort: Pulmonary effort is normal.     Breath sounds: Normal breath sounds.  Neurological:     Mental Status: She is alert and oriented to person, place, and time. Mental status is at baseline.           Assessment & Plan:  She seems to have recovered from an atypical pneumonia. The recent lightheadedness is likely due to some dehydration. I asked her be sure to drink 6 bottles of water every day.  Corita Diego, MD

## 2023-12-25 ENCOUNTER — Ambulatory Visit (INDEPENDENT_AMBULATORY_CARE_PROVIDER_SITE_OTHER): Payer: Medicare Other | Admitting: *Deleted

## 2023-12-25 DIAGNOSIS — E538 Deficiency of other specified B group vitamins: Secondary | ICD-10-CM | POA: Diagnosis not present

## 2023-12-25 MED ORDER — CYANOCOBALAMIN 1000 MCG/ML IJ SOLN
1000.0000 ug | Freq: Once | INTRAMUSCULAR | Status: AC
  Administered 2023-12-25: 1000 ug via INTRAMUSCULAR

## 2023-12-25 NOTE — Progress Notes (Signed)
Per orders of Dr. Clent Ridges, injection of Cyanocobalamin given by Johnella Moloney. Patient tolerated injection well.

## 2024-01-08 ENCOUNTER — Ambulatory Visit: Payer: Medicare Other

## 2024-01-09 ENCOUNTER — Ambulatory Visit (INDEPENDENT_AMBULATORY_CARE_PROVIDER_SITE_OTHER): Payer: Medicare Other

## 2024-01-09 DIAGNOSIS — E538 Deficiency of other specified B group vitamins: Secondary | ICD-10-CM | POA: Diagnosis not present

## 2024-01-09 MED ORDER — CYANOCOBALAMIN 1000 MCG/ML IJ SOLN
1000.0000 ug | Freq: Once | INTRAMUSCULAR | Status: AC
  Administered 2024-01-09: 1000 ug via INTRAMUSCULAR

## 2024-01-09 NOTE — Progress Notes (Signed)
Per orders of Shirline Frees, NP , injection of B12 given by Vickii Chafe on Right Deltoid. Patient tolerated injection well.

## 2024-01-23 ENCOUNTER — Ambulatory Visit (INDEPENDENT_AMBULATORY_CARE_PROVIDER_SITE_OTHER): Payer: Medicare Other

## 2024-01-23 DIAGNOSIS — E538 Deficiency of other specified B group vitamins: Secondary | ICD-10-CM | POA: Diagnosis not present

## 2024-01-23 MED ORDER — CYANOCOBALAMIN 1000 MCG/ML IJ SOLN
1000.0000 ug | Freq: Once | INTRAMUSCULAR | Status: AC
  Administered 2024-01-23: 1000 ug via INTRAMUSCULAR

## 2024-01-23 NOTE — Progress Notes (Signed)
Pt here for monthly B12 injection per   B12 given IM and pt tolerated injection well.  Next B12 injection scheduled for

## 2024-01-31 DIAGNOSIS — Z87891 Personal history of nicotine dependence: Secondary | ICD-10-CM | POA: Diagnosis not present

## 2024-01-31 DIAGNOSIS — Z7901 Long term (current) use of anticoagulants: Secondary | ICD-10-CM | POA: Diagnosis not present

## 2024-01-31 DIAGNOSIS — J45909 Unspecified asthma, uncomplicated: Secondary | ICD-10-CM | POA: Diagnosis not present

## 2024-01-31 DIAGNOSIS — Z881 Allergy status to other antibiotic agents status: Secondary | ICD-10-CM | POA: Diagnosis not present

## 2024-01-31 DIAGNOSIS — Z885 Allergy status to narcotic agent status: Secondary | ICD-10-CM | POA: Diagnosis not present

## 2024-01-31 DIAGNOSIS — J479 Bronchiectasis, uncomplicated: Secondary | ICD-10-CM | POA: Diagnosis not present

## 2024-02-05 ENCOUNTER — Other Ambulatory Visit: Payer: Self-pay | Admitting: Family Medicine

## 2024-02-05 DIAGNOSIS — Z86711 Personal history of pulmonary embolism: Secondary | ICD-10-CM

## 2024-02-06 ENCOUNTER — Ambulatory Visit (INDEPENDENT_AMBULATORY_CARE_PROVIDER_SITE_OTHER): Payer: Medicare Other

## 2024-02-06 DIAGNOSIS — E538 Deficiency of other specified B group vitamins: Secondary | ICD-10-CM

## 2024-02-06 MED ORDER — CYANOCOBALAMIN 1000 MCG/ML IJ SOLN
1000.0000 ug | Freq: Once | INTRAMUSCULAR | Status: AC
Start: 2024-02-06 — End: 2024-02-06
  Administered 2024-02-06: 1000 ug via INTRAMUSCULAR

## 2024-02-06 NOTE — Progress Notes (Signed)
 Per orders of Rodell Perna, NP, injection of Cyanocobalamin 1000 mcg given by Jazlen Ogarro L Reginald Mangels. Patient tolerated injection well.

## 2024-02-11 DIAGNOSIS — Z1231 Encounter for screening mammogram for malignant neoplasm of breast: Secondary | ICD-10-CM | POA: Diagnosis not present

## 2024-02-11 LAB — HM MAMMOGRAPHY

## 2024-02-12 ENCOUNTER — Encounter: Payer: Self-pay | Admitting: Family Medicine

## 2024-02-20 ENCOUNTER — Ambulatory Visit: Payer: Medicare Other

## 2024-02-24 ENCOUNTER — Ambulatory Visit (INDEPENDENT_AMBULATORY_CARE_PROVIDER_SITE_OTHER)

## 2024-02-24 DIAGNOSIS — E538 Deficiency of other specified B group vitamins: Secondary | ICD-10-CM

## 2024-02-24 MED ORDER — CYANOCOBALAMIN 1000 MCG/ML IJ SOLN
1000.0000 ug | Freq: Once | INTRAMUSCULAR | Status: AC
  Administered 2024-02-24: 1000 ug via INTRAMUSCULAR

## 2024-02-24 NOTE — Progress Notes (Signed)
 Patient is in office today for a nurse visit for B12 Injection. Patient Injection was given in the  Left deltoid. Patient tolerated injection well.

## 2024-02-25 DIAGNOSIS — M8588 Other specified disorders of bone density and structure, other site: Secondary | ICD-10-CM | POA: Diagnosis not present

## 2024-02-25 LAB — HM DEXA SCAN

## 2024-03-04 ENCOUNTER — Ambulatory Visit

## 2024-03-09 ENCOUNTER — Ambulatory Visit (INDEPENDENT_AMBULATORY_CARE_PROVIDER_SITE_OTHER)

## 2024-03-09 DIAGNOSIS — E538 Deficiency of other specified B group vitamins: Secondary | ICD-10-CM

## 2024-03-09 MED ORDER — CYANOCOBALAMIN 1000 MCG/ML IJ SOLN
1000.0000 ug | Freq: Once | INTRAMUSCULAR | Status: AC
  Administered 2024-03-09: 1000 ug via INTRAMUSCULAR

## 2024-03-09 NOTE — Progress Notes (Signed)
 Pt here for monthly B12 injection per Dr Alyne Babinski  B12 1000mcg given IM and pt tolerated injection well.  Next B12 injection scheduled for 03/23/24

## 2024-03-23 ENCOUNTER — Ambulatory Visit (INDEPENDENT_AMBULATORY_CARE_PROVIDER_SITE_OTHER)

## 2024-03-23 DIAGNOSIS — E538 Deficiency of other specified B group vitamins: Secondary | ICD-10-CM | POA: Diagnosis not present

## 2024-03-23 MED ORDER — CYANOCOBALAMIN 1000 MCG/ML IJ SOLN
1000.0000 ug | Freq: Once | INTRAMUSCULAR | Status: AC
Start: 2024-03-23 — End: 2024-03-23
  Administered 2024-03-23: 1000 ug via INTRAMUSCULAR

## 2024-03-23 NOTE — Progress Notes (Signed)
 Patient is in office today for a nurse visit for B12 Injection. Patient Injection was given in the  Left deltoid. Patient tolerated injection well.

## 2024-03-27 ENCOUNTER — Ambulatory Visit: Payer: Self-pay

## 2024-03-27 ENCOUNTER — Telehealth: Payer: Self-pay

## 2024-03-27 NOTE — Telephone Encounter (Signed)
 Copied from CRM [id number]. Topic: Clinical - Medical Advice >> Mar 27, 2024  8:33 AM Albertha Alosa wrote: Reason for CRM: Patient called in stating she woke up with pink eye, would like to see if Dr.Frys nurse can call her in something for it

## 2024-03-27 NOTE — Telephone Encounter (Signed)
 Chief Complaint: eye redness Symptoms: eye redness, crusting, blurry vision Frequency: this AM Pertinent Negatives: Patient denies swelling Disposition: [] ED /[x] Urgent Care (no appt availability in office) / [] Appointment(In office/virtual)/ []  [place] [facility]/ [] Home Care/ [x] Refused Recommended Disposition /[] [place] [facility]/ []  Follow-up with PCP Additional Notes: Pt reports bilateral eye redness with crusting and blurry vision since this AM when she woke up. Pt states she cannot go to an UC due to blurry vision and her husband leaves soon for work and can't take her. No virtual appts available today. Pt states she believes this is pink eye and has had it before and Dr. Alyne Babinski prescribed her eye drops for it which helped. Pt requesting those be sent to her today. RN also advised pt we could schedule her a virtual UC visit, but pt declined at this time. Pt states she is going to send a picture of the eye in Mychart. Pt would appreciate follow-up.   Reason for Disposition  New or worsening blurred vision  Answer Assessment - Initial Assessment Questions 1. LOCATION: Location: "What's red, the eyeball or the outer eyelids?" (Note: when callers say the eye is red, they usually mean the sclera is red)       Whites of the eyes are "very hard, bloody" 2. REDNESS OF SCLERA: "Is the redness in one or both eyes?" "When did the redness start?"      L eye 3. ONSET: "When did the eye become red?" (e.g., hours, days)      Woke up today with symptoms  4. EYELIDS: "Are the eyelids red or swollen?" If Yes, ask: "How much?"      Red, not swollen 5. VISION: "Is there any difficulty seeing clearly?"      yes 6. ITCHING: "Does it feel itchy?" If so ask: "How bad is it" (e.g., Scale 1-10; or mild, moderate, severe)     No  7. PAIN: "Is there any pain? If Yes, ask: "How bad is it?" (e.g., Scale 1-10; or mild, moderate, severe)     No  8. CONTACT LENS: "Do you wear contacts?"     No  9. CAUSE:  "What do you think is causing the redness?"     Pink eye 10. OTHER SYMPTOMS: "Do you have any other symptoms?" (e.g., fever, runny nose, cough, vomiting)       States she has had this before, was prescribed drops. Crusted this AM. "Bloody red"  Protocols used: Eye - Red Without Pus-A-AH

## 2024-03-30 NOTE — Addendum Note (Signed)
 Addended by: Corita Diego A on: 03/30/2024 10:13 AM   Modules accepted: Orders

## 2024-03-30 NOTE — Telephone Encounter (Signed)
 Please call in Tobradex  eye drops, instill 2 drops both eyes every 4 hours while awake, 5 ml, no rf

## 2024-03-31 ENCOUNTER — Other Ambulatory Visit: Payer: Self-pay

## 2024-03-31 MED ORDER — TOBRAMYCIN-DEXAMETHASONE 0.3-0.1 % OP SUSP: 2.0000 [drp] | OPHTHALMIC | 0 refills | Status: DC

## 2024-04-03 NOTE — Telephone Encounter (Signed)
 Pt picked up Rx form pharmacy

## 2024-04-06 ENCOUNTER — Ambulatory Visit

## 2024-04-13 ENCOUNTER — Ambulatory Visit: Admitting: *Deleted

## 2024-04-13 ENCOUNTER — Telehealth: Payer: Self-pay | Admitting: Family Medicine

## 2024-04-13 DIAGNOSIS — E538 Deficiency of other specified B group vitamins: Secondary | ICD-10-CM

## 2024-04-13 MED ORDER — CYANOCOBALAMIN 1000 MCG/ML IJ SOLN
1000.0000 ug | Freq: Once | INTRAMUSCULAR | Status: AC
Start: 2024-04-13 — End: 2024-04-13
  Administered 2024-04-13: 1000 ug via INTRAMUSCULAR

## 2024-04-13 NOTE — Telephone Encounter (Signed)
 Patient is still getting B12 injections.  Patient has not seen Dr. Alyne Babinski in over a year.  She was told by Mia Adam to make an appt with Dr. Alyne Babinski, however she left without making that appt.

## 2024-04-13 NOTE — Progress Notes (Signed)
 Per orders of Dr. Clent Ridges, injection of Cyanocobalamin given by Johnella Moloney. Patient tolerated injection well.

## 2024-04-14 NOTE — Telephone Encounter (Signed)
 I have no idea what they are talking about. I saw her in January !!

## 2024-04-24 DIAGNOSIS — J479 Bronchiectasis, uncomplicated: Secondary | ICD-10-CM | POA: Diagnosis not present

## 2024-04-24 DIAGNOSIS — J45909 Unspecified asthma, uncomplicated: Secondary | ICD-10-CM | POA: Diagnosis not present

## 2024-04-27 ENCOUNTER — Ambulatory Visit

## 2024-04-29 ENCOUNTER — Ambulatory Visit (INDEPENDENT_AMBULATORY_CARE_PROVIDER_SITE_OTHER): Admitting: *Deleted

## 2024-04-29 DIAGNOSIS — E538 Deficiency of other specified B group vitamins: Secondary | ICD-10-CM

## 2024-04-29 MED ORDER — CYANOCOBALAMIN 1000 MCG/ML IJ SOLN
1000.0000 ug | Freq: Once | INTRAMUSCULAR | Status: AC
  Administered 2024-04-29: 1000 ug via INTRAMUSCULAR

## 2024-04-29 NOTE — Progress Notes (Signed)
 Per orders of Dr. Clent Ridges, injection of Cyanocobalamin given by Johnella Moloney. Patient tolerated injection well.

## 2024-05-01 NOTE — Progress Notes (Signed)
 Per orders of Dr. Clent Ridges, injection of Cyanocobalamin given by Johnella Moloney. Patient tolerated injection well.

## 2024-05-10 ENCOUNTER — Emergency Department (HOSPITAL_BASED_OUTPATIENT_CLINIC_OR_DEPARTMENT_OTHER)

## 2024-05-10 ENCOUNTER — Other Ambulatory Visit: Payer: Self-pay

## 2024-05-10 ENCOUNTER — Emergency Department (HOSPITAL_BASED_OUTPATIENT_CLINIC_OR_DEPARTMENT_OTHER)
Admission: EM | Admit: 2024-05-10 | Discharge: 2024-05-10 | Disposition: A | Discharge status: Home / Self Care | Attending: Emergency Medicine | Admitting: Emergency Medicine

## 2024-05-10 ENCOUNTER — Encounter (HOSPITAL_BASED_OUTPATIENT_CLINIC_OR_DEPARTMENT_OTHER): Payer: Self-pay

## 2024-05-10 DIAGNOSIS — M25562 Pain in left knee: Secondary | ICD-10-CM | POA: Insufficient documentation

## 2024-05-10 DIAGNOSIS — M1712 Unilateral primary osteoarthritis, left knee: Secondary | ICD-10-CM | POA: Diagnosis not present

## 2024-05-10 DIAGNOSIS — Z85118 Personal history of other malignant neoplasm of bronchus and lung: Secondary | ICD-10-CM | POA: Insufficient documentation

## 2024-05-10 DIAGNOSIS — S83195A Other dislocation of left knee, initial encounter: Secondary | ICD-10-CM | POA: Diagnosis not present

## 2024-05-10 DIAGNOSIS — I10 Essential (primary) hypertension: Secondary | ICD-10-CM | POA: Insufficient documentation

## 2024-05-10 DIAGNOSIS — M85862 Other specified disorders of bone density and structure, left lower leg: Secondary | ICD-10-CM | POA: Diagnosis not present

## 2024-05-10 DIAGNOSIS — S8992XA Unspecified injury of left lower leg, initial encounter: Secondary | ICD-10-CM | POA: Diagnosis not present

## 2024-05-10 DIAGNOSIS — Z79899 Other long term (current) drug therapy: Secondary | ICD-10-CM | POA: Insufficient documentation

## 2024-05-10 NOTE — Discharge Instructions (Addendum)
 Please use Tylenol  for pain.  You may use 1000 mg of Tylenol  every 6 hours.  Not to exceed 4 g of Tylenol  within 24 hours.  When you are not walking recommend keeping the leg elevated, placing ice on the affected area where it is hurting for up to 15 minutes at a time up to twice an hour.  Wear the brace when you are walking or if you are having any knee pain help with stability and support.  Contact the orthopedic physician's contact formation I provided above to schedule a follow-up appointment if the pain is not improving.

## 2024-05-10 NOTE — ED Triage Notes (Signed)
 Pt POV from home c/o left knee pain. Was walking the dog when she stepped in a hole, causing her left leg to twist abruptly, but did not fall. Pt ambulatory w/o assistance but limping

## 2024-05-10 NOTE — ED Provider Notes (Signed)
 [place] EMERGENCY DEPARTMENT AT [facility] Provider Note   CSN: [id number] Arrival date & time: 05/10/24  1312     Patient presents with: Knee Pain   [name] is a 78 y.o. female with past medical history seen for hypertension, squamous cell lung cancer, polymyalgia rheumatica who presents with concern for left knee pain.  She reports she was walking her dog and she stepped in a hole causing her left leg to twist abruptly, she did not fall.  She is ambulatory without assistance but reports some limping.  She has been taking Tylenol  for pain with some relief.  She reports that it still hurts when she walks on it.    Knee Pain      Prior to Admission medications   Medication Sig Start Date End Date Taking? Authorizing Provider  acetaminophen  (TYLENOL ) 500 MG tablet Take 1,000 mg by mouth every 6 (six) hours as needed for mild pain.    [provider]  albuterol  (PROVENTIL ) (2.5 MG/3ML) 0.083% nebulizer solution Take 3 mLs (2.5 mg total) by nebulization every 4 (four) hours as needed for wheezing or shortness of breath. 03/20/21   Donley Furth, MD  albuterol  (PROVENTIL ) (2.5 MG/3ML) 0.083% nebulizer solution Take 3 mLs (2.5 mg total) by nebulization every 4 (four) hours as needed for wheezing or shortness of breath. 07/25/23   Donley Furth, MD  albuterol  (VENTOLIN  HFA) 108 (90 Base) MCG/ACT inhaler INHALE 2 PUFFS BY MOUTH EVERY 4 HOURS AS NEEDED FOR WHEEZING OR SHORTNESS OF BREATH 12/24/22   Donley Furth, MD  AREXVY 120 MCG/0.5ML injection  10/31/22   [provider]  benzonatate  (TESSALON ) 100 MG capsule Take 1 capsule (100 mg total) by mouth in the morning, at noon, in the evening, and at bedtime. 12/11/23   Donley Furth, MD  budesonide -formoterol  (SYMBICORT ) 160-4.5 MCG/ACT inhaler Inhale 2 puffs into the lungs 2 (two) times daily. 09/12/23   Donley Furth, MD  COMIRNATY syringe  11/07/22   [provider]  cyanocobalamin  (,VITAMIN  B-12,) 1000 MCG/ML injection Inject 1,000 mcg into the muscle every 14 (fourteen) days.    [provider]  enoxaparin  (LOVENOX ) 60 MG/0.6ML injection Inject 0.6 mLs (60 mg total) into the skin every 12 (twelve) hours. 04/18/22   Donley Furth, MD  erythromycin  ophthalmic ointment Apply 1/2 inch strip to the lower eyelid of both eyes four times a day for 7 days 10/02/23   Viola Greulich, MD  ondansetron  (ZOFRAN ) 8 MG tablet Take 1 tablet (8 mg total) by mouth every 6 (six) hours as needed for nausea or vomiting. 04/27/22   Donley Furth, MD  sertraline  (ZOLOFT ) 50 MG tablet Take 1 tablet (50 mg total) by mouth daily. 04/18/22   Donley Furth, MD  sodium chloride  HYPERTONIC 3 % nebulizer solution Take by nebulization as needed for other. 07/24/23   Donley Furth, MD  tobramycin -dexamethasone  (TOBRADEX ) ophthalmic solution Place 2 drops into both eyes every 4 (four) hours while awake. 03/31/24   Donley Furth, MD  XARELTO  20 MG TABS tablet TAKE 1 TABLET BY MOUTH ONCE DAILY WITH SUPPER 02/05/24   Donley Furth, MD    Allergies: Ciprofloxacin , Promethazine, Polymyxin b , Prednisone , Trimethoprim , and Morphine and codeine    Review of Systems  All other systems reviewed and are negative.   Updated Vital Signs BP (!) 154/89 (BP Location: Right Arm)   Pulse (!) 101   Temp 98.1 F (36.7 C)  Resp 18   Ht 5' 5 (1.651 m)   Wt 60.3 kg   SpO2 96%   BMI 22.13 kg/m   Physical Exam Vitals and nursing note reviewed.  Constitutional:      General: She is not in acute distress.    Appearance: Normal appearance.  HENT:     Head: Normocephalic and atraumatic.   Eyes:     General:        Right eye: No discharge.        Left eye: No discharge.    Cardiovascular:     Rate and Rhythm: Normal rate and regular rhythm.  Pulmonary:     Effort: Pulmonary effort is normal. No respiratory distress.   Musculoskeletal:        General: No deformity.     Comments: Patient with mild soft tissue  edema, some tenderness palpation of the medial knee on the left.  No varus, valgus, anterior posterior drawer laxity.  Some pain with McMurray but no crepitus.  She is ambulatory with some pain   Skin:    General: Skin is warm and dry.   Neurological:     Mental Status: She is alert and oriented to person, place, and time.   Psychiatric:        Mood and Affect: Mood normal.        Behavior: Behavior normal.     (all labs ordered are listed, but only abnormal results are displayed) Labs Reviewed - No data to display  EKG: None  Radiology: DG Knee Left Port Result Date: 05/10/2024 CLINICAL DATA:  16109 Knee injury 60454 EXAM: PORTABLE LEFT KNEE - 1-2 VIEW COMPARISON:  November 05, 2018 FINDINGS: Osteopenia. No acute fracture or dislocation. Mild joint space narrowing and osteophyte formation of the medial compartment. No area of erosion or osseous destruction. No unexpected radiopaque foreign body. Soft tissues are unremarkable. IMPRESSION: 1. No acute fracture or dislocation. 2. Mild degenerative changes of the medial compartment. Electronically Signed   By: Clancy Crimes M.D.   On: 05/10/2024 14:40     Procedures   Medications Ordered in the ED - No data to display                                  Medical Decision Making Amount and/or Complexity of Data Reviewed Radiology: ordered.    This patient is a 78 y.o. female who presents to the ED for concern of knee pain, injury.   Differential diagnoses prior to evaluation: Fracture, dislocation, ACL or other collateral ligament injury, meniscus injury, vs other  Past Medical History / Social History / Additional history: Chart reviewed. Pertinent results include:  hypertension, squamous cell lung cancer, polymyalgia rheumatica  Physical Exam: Physical exam performed. The pertinent findings include:  Patient with mild soft tissue edema, some tenderness palpation of the medial knee on the left.  No varus, valgus,  anterior posterior drawer laxity.  Some pain with McMurray but no crepitus.  She is ambulatory with some pain   Medications / Treatment: I independently interpreted plain film radiograph of the left knee which shows mild degenerative changes of medial compartment  With no significant laxity low suspicion for collateral ligament injury, some pain with McMurray, could suggest mild meniscus injury but given that she is weightbearing less likely.  Suspect acute on chronic medial compartment arthritis, offered knee brace, continue home Tylenol , orthopedic follow-up as needed.   Disposition: After  consideration of the diagnostic results and the patients response to treatment, I feel that patient is stable for discharge with plan as above.   emergency department workup does not suggest an emergent condition requiring admission or immediate intervention beyond what has been performed at this time. The plan is: as above. The patient is safe for discharge and has been instructed to return immediately for worsening symptoms, change in symptoms or any other concerns.   Final diagnoses:  Acute pain of left knee    ED Discharge Orders     None          Stefan Edge 05/10/24 1459    Rosealee Concha, MD 05/11/24 1029

## 2024-05-10 NOTE — ED Notes (Signed)
 Dc instructions reviewed with patient. Patient voiced understanding. Dc with belongings.

## 2024-05-11 ENCOUNTER — Telehealth: Payer: Self-pay | Admitting: *Deleted

## 2024-05-11 ENCOUNTER — Other Ambulatory Visit: Payer: Self-pay | Admitting: *Deleted

## 2024-05-11 DIAGNOSIS — Z86711 Personal history of pulmonary embolism: Secondary | ICD-10-CM

## 2024-05-11 MED ORDER — RIVAROXABAN 20 MG PO TABS: 20.0000 mg | ORAL_TABLET | Freq: Every day | ORAL | 5 refills | Status: DC

## 2024-05-11 NOTE — Transitions of Care (Post Inpatient/ED Visit) (Signed)
 05/11/2024  Name: [name] MRN: [id number] DOB: [birth year]  Today's TOC FU Call Status: Today's TOC FU Call Status:: Successful TOC FU Call Completed TOC FU Call Complete Date: 05/11/24 Patient's Name and Date of Birth confirmed.  Transition Care Management Follow-up Telephone Call Date of Discharge: 05/10/24 Discharge Facility: [facility] ([facility]) Type of Discharge: Emergency Department Reason for ED Visit: Other: How have you been since you were released from the hospital?: Same Any questions or concerns?: No  Items Reviewed: Did you receive and understand the discharge instructions provided?: Yes Medications obtained,verified, and reconciled?: Yes (Medications Reviewed) Any new allergies since your discharge?: No Dietary orders reviewed?: NA Do you have support at home?: Yes People in Home [RPT]: spouse  Medications Reviewed Today: Medications Reviewed Today     Reviewed by Adron Albino, CMA (Certified Medical Assistant) on 05/11/24 at 1442  Med List Status: <None>   Medication Order Taking? Sig Documenting Provider Last Dose Status Informant  acetaminophen  (TYLENOL ) 500 MG tablet [id number] Yes Take 1,000 mg by mouth every 6 (six) hours as needed for mild pain. [provider]  Active Self  albuterol  (PROVENTIL ) (2.5 MG/3ML) 0.083% nebulizer solution [id number] Yes Take 3 mLs (2.5 mg total) by nebulization every 4 (four) hours as needed for wheezing or shortness of breath. Donley Furth, MD  Active Self  albuterol  (PROVENTIL ) (2.5 MG/3ML) 0.083% nebulizer solution [id number] Yes Take 3 mLs (2.5 mg total) by nebulization every 4 (four) hours as needed for wheezing or shortness of breath. Donley Furth, MD  Active   albuterol  (VENTOLIN  HFA) 108 [phone] Base) MCG/ACT inhaler [id number] Yes INHALE 2 PUFFS BY MOUTH EVERY 4 HOURS AS NEEDED FOR WHEEZING OR SHORTNESS OF Edyth Grana, MD  Active   AREXVY 120 MCG/0.5ML injection [id number] Yes  [provider]  Active   benzonatate  (TESSALON ) 100 MG capsule [id number] Yes Take 1 capsule (100 mg total) by mouth in the morning, at noon, in the evening, and at bedtime. Donley Furth, MD  Active   budesonide -formoterol  (SYMBICORT ) 160-4.5 MCG/ACT inhaler [id number] Yes Inhale 2 puffs into the lungs 2 (two) times daily. Donley Furth, MD  Active   COMIRNATY syringe [id number] Yes  [provider]  Active   cyanocobalamin  ((VITAMIN B-12)) injection 1,000 mcg [id number]   Donley Furth, MD  Active   cyanocobalamin  (,VITAMIN B-12,) 1000 MCG/ML injection [id number]  Inject 1,000 mcg into the muscle every 14 (fourteen) days. [provider]  Active Self  enoxaparin  (LOVENOX ) 60 MG/0.6ML injection [id number] Yes Inject 0.6 mLs (60 mg total) into the skin every 12 (twelve) hours. Donley Furth, MD  Active   erythromycin  ophthalmic ointment [id number] Yes Apply 1/2 inch strip to the lower eyelid of both eyes four times a day for 7 days Viola Greulich, MD  Active   ondansetron  (ZOFRAN ) 8 MG tablet [id number] Yes Take 1 tablet (8 mg total) by mouth every 6 (six) hours as needed for nausea or vomiting. Donley Furth, MD  Active   sertraline  (ZOLOFT ) 50 MG tablet [id number] Yes Take 1 tablet (50 mg total) by mouth daily. Donley Furth, MD  Active   sodium chloride  HYPERTONIC 3 % nebulizer solution [id number] Yes Take by nebulization as needed for other. Donley Furth, MD  Active   tobramycin -dexamethasone  (TOBRADEX ) ophthalmic solution [id number] Yes Place 2 drops into both eyes every 4 (four) hours while awake. Donley Furth, MD  Active   XARELTO  20 MG  TABS tablet [id number] Yes TAKE 1 TABLET BY MOUTH ONCE DAILY WITH SUPPER Donley Furth, MD  Active             Home Care and Equipment/Supplies: Were Home Health Services Ordered?: No Any new equipment or medical supplies ordered?: No  Functional Questionnaire: Do you need assistance with bathing/showering or dressing?: No Do you  need assistance with meal preparation?: No Do you need assistance with eating?: No Do you have difficulty maintaining continence: No Do you need assistance with getting out of bed/getting out of a chair/moving?: No Do you have difficulty managing or taking your medications?: No  Follow up appointments reviewed: PCP Follow-up appointment confirmed?: Yes Date of PCP follow-up appointment?: 05/13/24 Follow-up Provider: Corita Diego [facility] Follow-up appointment confirmed?: NA Do you need transportation to your follow-up appointment?: No Do you understand care options if your condition(s) worsen?: Yes-patient verbalized understanding    SIGNATURE: Easton Sivertson

## 2024-05-13 ENCOUNTER — Encounter: Payer: Self-pay | Admitting: Family Medicine

## 2024-05-13 ENCOUNTER — Ambulatory Visit

## 2024-05-13 ENCOUNTER — Ambulatory Visit (INDEPENDENT_AMBULATORY_CARE_PROVIDER_SITE_OTHER): Admitting: Family Medicine

## 2024-05-13 VITALS — BP 120/70 | HR 87 | Temp 98.0°F | Wt 135.0 lb

## 2024-05-13 DIAGNOSIS — E538 Deficiency of other specified B group vitamins: Secondary | ICD-10-CM

## 2024-05-13 DIAGNOSIS — R739 Hyperglycemia, unspecified: Secondary | ICD-10-CM | POA: Diagnosis not present

## 2024-05-13 DIAGNOSIS — E785 Hyperlipidemia, unspecified: Secondary | ICD-10-CM

## 2024-05-13 DIAGNOSIS — S83412D Sprain of medial collateral ligament of left knee, subsequent encounter: Secondary | ICD-10-CM | POA: Diagnosis not present

## 2024-05-13 LAB — BASIC METABOLIC PANEL WITH GFR
BUN: 18 mg/dL (ref 6–23)
CO2: 30 meq/L (ref 19–32)
Calcium: 9.9 mg/dL (ref 8.4–10.5)
Chloride: 103 meq/L (ref 96–112)
Creatinine, Ser: 0.76 mg/dL (ref 0.40–1.20)
GFR: 74.62 mL/min (ref >60.00)
Glucose, Bld: 84 mg/dL (ref 70–99)
Potassium: 4.3 meq/L (ref 3.5–5.1)
Sodium: 139 meq/L (ref 135–145)

## 2024-05-13 LAB — CBC WITH DIFFERENTIAL/PLATELET
Basophils Absolute: 0 10*3/uL (ref 0.0–0.1)
Basophils Relative: 0.7 % (ref 0.0–3.0)
Eosinophils Absolute: 0.1 10*3/uL (ref 0.0–0.7)
Eosinophils Relative: 2.8 % (ref 0.0–5.0)
HCT: 40.9 % (ref 36.0–46.0)
Hemoglobin: 14 g/dL (ref 12.0–15.0)
Lymphocytes Relative: 22.2 % (ref 12.0–46.0)
Lymphs Abs: 1.1 10*3/uL (ref 0.7–4.0)
MCHC: 34.3 g/dL (ref 30.0–36.0)
MCV: 86.8 fl (ref 78.0–100.0)
Monocytes Absolute: 0.4 10*3/uL (ref 0.1–1.0)
Monocytes Relative: 8.1 % (ref 3.0–12.0)
Neutro Abs: 3.1 10*3/uL (ref 1.4–7.7)
Neutrophils Relative %: 66.2 % (ref 43.0–77.0)
Platelets: 239 10*3/uL (ref 150.0–400.0)
RBC: 4.71 Mil/uL (ref 3.87–5.11)
RDW: 14.2 % (ref 11.5–15.5)
WBC: 4.7 10*3/uL (ref 4.0–10.5)

## 2024-05-13 LAB — HEPATIC FUNCTION PANEL
ALT: 11 U/L (ref 0–35)
AST: 15 U/L (ref 0–37)
Albumin: 4.5 g/dL (ref 3.5–5.2)
Alkaline Phosphatase: 53 U/L (ref 39–117)
Bilirubin, Direct: 0.1 mg/dL (ref 0.0–0.3)
Total Bilirubin: 0.5 mg/dL (ref 0.2–1.2)
Total Protein: 7.2 g/dL (ref 6.0–8.3)

## 2024-05-13 LAB — LIPID PANEL
Cholesterol: 221 mg/dL — ABNORMAL HIGH (ref 0–200)
HDL: 82.4 mg/dL (ref >39.00)
LDL Cholesterol: 121 mg/dL — ABNORMAL HIGH (ref 0–99)
NonHDL: 138.82
Total CHOL/HDL Ratio: 3
Triglycerides: 87 mg/dL (ref 0.0–149.0)
VLDL: 17.4 mg/dL (ref 0.0–40.0)

## 2024-05-13 LAB — TSH: TSH: 1.53 u[IU]/mL (ref 0.35–5.50)

## 2024-05-13 LAB — HEMOGLOBIN A1C: Hgb A1c MFr Bld: 5.2 % (ref 4.6–6.5)

## 2024-05-13 MED ORDER — CYANOCOBALAMIN 1000 MCG/ML IJ SOLN
1000.0000 ug | Freq: Once | INTRAMUSCULAR | Status: AC
  Administered 2024-05-13: 1000 ug via INTRAMUSCULAR

## 2024-05-13 NOTE — Progress Notes (Signed)
   Subjective:    Patient ID: [name], female    DOB: November 07, 1946, 78 y.o.   MRN: [id number]  HPI Here to follow up an ED visit on 05-10-24 for an injury to the left knee that occurred on 05-07-24. She was walking her dog when she stepped into a hole and twisted the knee. At the ED she was tender on exam. Xrays were negative for any fractures, but they did show degeneration in both menisci. Since then she has been wearing a brace, icing the knee several times a day, and taking Tylenol . She says the swelling has improved but the pain is about the same. She says the worst pain is in the medial side of the knee.    Review of Systems  Constitutional: Negative.   Respiratory: Negative.    Cardiovascular: Negative.   Musculoskeletal:  Positive for arthralgias.       Objective:   Physical Exam Constitutional:      Comments: Walks with a limp    Cardiovascular:     Rate and Rhythm: Normal rate and regular rhythm.     Pulses: Normal pulses.     Heart sounds: Normal heart sounds.  Pulmonary:     Effort: Pulmonary effort is normal.     Breath sounds: Normal breath sounds.   Musculoskeletal:     Comments: Left knee is not swollen, but she is very tender along the medial joint space and in the popliteal space. ROM is very limited due to pain.   Neurological:     Mental Status: She is alert.           Assessment & Plan:  Left knee injury. I think this very likely involves a torn medial meniscus. We will refer her to Orthopedics to evaluate. We spent a total of (33   ) minutes reviewing records and discussing these issues.  Corita Diego, MD

## 2024-05-15 ENCOUNTER — Ambulatory Visit: Payer: Self-pay | Admitting: Family Medicine

## 2024-05-25 ENCOUNTER — Ambulatory Visit: Payer: Medicare Other

## 2024-05-26 DIAGNOSIS — M25562 Pain in left knee: Secondary | ICD-10-CM | POA: Diagnosis not present

## 2024-05-27 ENCOUNTER — Ambulatory Visit (INDEPENDENT_AMBULATORY_CARE_PROVIDER_SITE_OTHER)

## 2024-05-27 DIAGNOSIS — E538 Deficiency of other specified B group vitamins: Secondary | ICD-10-CM

## 2024-05-27 MED ORDER — CYANOCOBALAMIN 1000 MCG/ML IJ SOLN
1000.0000 ug | Freq: Once | INTRAMUSCULAR | Status: AC
  Administered 2024-05-27: 1000 ug via INTRAMUSCULAR

## 2024-05-27 NOTE — Progress Notes (Signed)
 Per orders of Dr. Clent Ridges, injection of Cyanocobalamin 1000 mcg given by Stann Ore. Patient tolerated injection well.

## 2024-05-30 DIAGNOSIS — M25562 Pain in left knee: Secondary | ICD-10-CM | POA: Diagnosis not present

## 2024-06-10 ENCOUNTER — Ambulatory Visit: Admitting: *Deleted

## 2024-06-10 DIAGNOSIS — E538 Deficiency of other specified B group vitamins: Secondary | ICD-10-CM | POA: Diagnosis not present

## 2024-06-10 MED ORDER — CYANOCOBALAMIN 1000 MCG/ML IJ SOLN
1000.0000 ug | Freq: Once | INTRAMUSCULAR | Status: AC
  Administered 2024-06-10: 1000 ug via INTRAMUSCULAR

## 2024-06-10 NOTE — Progress Notes (Addendum)
 Per orders of Dr. Clent Ridges, injection of Cyanocobalamin given by Johnella Moloney. Patient tolerated injection well.

## 2024-06-18 DIAGNOSIS — M25562 Pain in left knee: Secondary | ICD-10-CM | POA: Diagnosis not present

## 2024-06-23 ENCOUNTER — Telehealth: Payer: Self-pay

## 2024-06-23 NOTE — Telephone Encounter (Signed)
 Pt surgical clearance form was received and has been placed on Dr Johnny red folder for completing, pt will be notified when faxed  Copied from CRM [id number]. Topic: General - Other >> Jun 22, 2024  3:41 PM Pinkey ORN wrote: Reason for CRM: Surgery Clearance

## 2024-06-24 ENCOUNTER — Ambulatory Visit (INDEPENDENT_AMBULATORY_CARE_PROVIDER_SITE_OTHER): Admitting: *Deleted

## 2024-06-24 DIAGNOSIS — E538 Deficiency of other specified B group vitamins: Secondary | ICD-10-CM | POA: Diagnosis not present

## 2024-06-24 MED ORDER — CYANOCOBALAMIN 1000 MCG/ML IJ SOLN
1000.0000 ug | Freq: Once | INTRAMUSCULAR | Status: AC
  Administered 2024-06-24: 1000 ug via INTRAMUSCULAR

## 2024-06-24 NOTE — Progress Notes (Signed)
 Per orders of Dr. Clent Ridges, injection of Cyanocobalamin given by Johnella Moloney. Patient tolerated injection well.

## 2024-07-06 DIAGNOSIS — S83232A Complex tear of medial meniscus, current injury, left knee, initial encounter: Secondary | ICD-10-CM | POA: Diagnosis not present

## 2024-07-06 DIAGNOSIS — M1712 Unilateral primary osteoarthritis, left knee: Secondary | ICD-10-CM | POA: Diagnosis not present

## 2024-07-06 DIAGNOSIS — M948X6 Other specified disorders of cartilage, lower leg: Secondary | ICD-10-CM | POA: Diagnosis not present

## 2024-07-06 DIAGNOSIS — Y999 Unspecified external cause status: Secondary | ICD-10-CM | POA: Diagnosis not present

## 2024-07-06 DIAGNOSIS — X58XXXA Exposure to other specified factors, initial encounter: Secondary | ICD-10-CM | POA: Diagnosis not present

## 2024-07-06 DIAGNOSIS — S83242A Other tear of medial meniscus, current injury, left knee, initial encounter: Secondary | ICD-10-CM | POA: Diagnosis not present

## 2024-07-08 ENCOUNTER — Ambulatory Visit

## 2024-07-15 ENCOUNTER — Telehealth: Payer: Self-pay

## 2024-07-15 MED ORDER — DOXYCYCLINE HYCLATE 100 MG PO TABS: 100.0000 mg | ORAL_TABLET | Freq: Two times a day (BID) | ORAL | 0 refills | Status: DC

## 2024-07-15 NOTE — Telephone Encounter (Signed)
 Copied from CRM [id number]. Topic: Clinical - Medication Question >> Jul 14, 2024 11:56 AM [name] wrote: Reason for CRM: Patient states she just had knee surgery a week ago and cannot come in for an appt. She woke up this morning sick with a sore throat, chills, and cough. She would like to know if we can call her in some medication for her symptoms. I offered a VV for her tomorrow but she states she needs something called in today if possible please.  If so, she would like something called in to the following pharmacy please:  [facility] [address] [place] - [address] [place] [address] Phone: [phone] Fax: [phone] Hours: Not open 24 hours   Patient callback is [phone]

## 2024-07-15 NOTE — Telephone Encounter (Signed)
 I sent in some Doxycycline 

## 2024-07-16 NOTE — Telephone Encounter (Signed)
 Pt notified to pick up Rx fro her pharmacy, voiced understanding

## 2024-07-28 ENCOUNTER — Ambulatory Visit (INDEPENDENT_AMBULATORY_CARE_PROVIDER_SITE_OTHER): Admitting: Family Medicine

## 2024-07-28 ENCOUNTER — Encounter: Payer: Self-pay | Admitting: Family Medicine

## 2024-07-28 VITALS — BP 102/74 | HR 71 | Temp 98.1°F | Wt 137.2 lb

## 2024-07-28 DIAGNOSIS — J40 Bronchitis, not specified as acute or chronic: Secondary | ICD-10-CM | POA: Diagnosis not present

## 2024-07-28 DIAGNOSIS — E538 Deficiency of other specified B group vitamins: Secondary | ICD-10-CM | POA: Diagnosis not present

## 2024-07-28 LAB — POCT INFLUENZA A/B
Influenza A, POC: NEGATIVE
Influenza B, POC: NEGATIVE

## 2024-07-28 LAB — POC COVID19 BINAXNOW: SARS Coronavirus 2 Ag: NEGATIVE

## 2024-07-28 MED ORDER — DOXYCYCLINE HYCLATE 100 MG PO TABS: 100.0000 mg | ORAL_TABLET | Freq: Two times a day (BID) | ORAL | 0 refills | Status: DC

## 2024-07-28 MED ORDER — CYANOCOBALAMIN 1000 MCG/ML IJ SOLN
1000.0000 ug | Freq: Once | INTRAMUSCULAR | Status: AC
  Administered 2024-07-28: 1000 ug via INTRAMUSCULAR

## 2024-07-28 NOTE — Progress Notes (Signed)
   Subjective:    Patient ID: [name], female    DOB: 05/24/1946, 78 y.o.   MRN: [id number]  HPI Here for 2 weeks of fever to 101.5 degrees, dry cough, and SOB. Her oxygen sat at home dropped to 90% at one point. We called in 10 days of Doxycycline , and she says this was helpful. The fever went away, and her oxygen sats are back up. She feels stronger but the cough remains. Taking Benzonatate .    Review of Systems  Constitutional: Negative.   HENT: Negative.    Eyes: Negative.   Respiratory:  Positive for cough and shortness of breath. Negative for wheezing.   Cardiovascular: Negative.        Objective:   Physical Exam Constitutional:      Appearance: Normal appearance. She is not ill-appearing.  Cardiovascular:     Rate and Rhythm: Normal rate and regular rhythm.     Pulses: Normal pulses.     Heart sounds: Normal heart sounds.  Pulmonary:     Effort: Pulmonary effort is normal.     Breath sounds: Rhonchi present. No wheezing or rales.  Neurological:     Mental Status: She is alert.           Assessment & Plan:  Partially treated bronchitis. We will give her another 10 days of Doxycycline . Recheck as needed.  Garnette Olmsted, MD

## 2024-07-28 NOTE — Addendum Note (Signed)
 Addended by: LADONNA INOCENTE SAILOR on: 07/28/2024 05:25 PM   Modules accepted: Orders

## 2024-07-31 ENCOUNTER — Other Ambulatory Visit (HOSPITAL_COMMUNITY): Payer: Self-pay | Admitting: Family Medicine

## 2024-07-31 ENCOUNTER — Ambulatory Visit (HOSPITAL_COMMUNITY)
Admission: RE | Admit: 2024-07-31 | Discharge: 2024-07-31 | Disposition: A | Discharge status: Home / Self Care | Source: Ambulatory Visit | Attending: Vascular Surgery | Admitting: Vascular Surgery

## 2024-07-31 DIAGNOSIS — S83242D Other tear of medial meniscus, current injury, left knee, subsequent encounter: Secondary | ICD-10-CM | POA: Diagnosis not present

## 2024-07-31 DIAGNOSIS — M79605 Pain in left leg: Secondary | ICD-10-CM

## 2024-08-06 DIAGNOSIS — C3490 Malignant neoplasm of unspecified part of unspecified bronchus or lung: Secondary | ICD-10-CM | POA: Diagnosis not present

## 2024-08-11 ENCOUNTER — Ambulatory Visit (INDEPENDENT_AMBULATORY_CARE_PROVIDER_SITE_OTHER)

## 2024-08-11 DIAGNOSIS — E538 Deficiency of other specified B group vitamins: Secondary | ICD-10-CM | POA: Diagnosis not present

## 2024-08-11 MED ORDER — CYANOCOBALAMIN 1000 MCG/ML IJ SOLN
1000.0000 ug | Freq: Once | INTRAMUSCULAR | Status: AC
  Administered 2024-08-11: 1000 ug via INTRAMUSCULAR

## 2024-08-11 NOTE — Progress Notes (Signed)
Per orders of Dr. Fry, injection of Cyanocobalamin 1000 mcg given by Foy Mungia L Remi Lopata. °Patient tolerated injection well.  °

## 2024-08-12 DIAGNOSIS — C3490 Malignant neoplasm of unspecified part of unspecified bronchus or lung: Secondary | ICD-10-CM | POA: Diagnosis not present

## 2024-08-21 DIAGNOSIS — Z7901 Long term (current) use of anticoagulants: Secondary | ICD-10-CM | POA: Diagnosis not present

## 2024-08-21 DIAGNOSIS — C3411 Malignant neoplasm of upper lobe, right bronchus or lung: Secondary | ICD-10-CM | POA: Diagnosis not present

## 2024-08-21 DIAGNOSIS — I5030 Unspecified diastolic (congestive) heart failure: Secondary | ICD-10-CM | POA: Diagnosis not present

## 2024-08-21 DIAGNOSIS — J479 Bronchiectasis, uncomplicated: Secondary | ICD-10-CM | POA: Diagnosis not present

## 2024-08-21 DIAGNOSIS — Z86711 Personal history of pulmonary embolism: Secondary | ICD-10-CM | POA: Diagnosis not present

## 2024-08-21 DIAGNOSIS — J45901 Unspecified asthma with (acute) exacerbation: Secondary | ICD-10-CM | POA: Diagnosis not present

## 2024-08-21 DIAGNOSIS — Z923 Personal history of irradiation: Secondary | ICD-10-CM | POA: Diagnosis not present

## 2024-08-21 DIAGNOSIS — C342 Malignant neoplasm of middle lobe, bronchus or lung: Secondary | ICD-10-CM | POA: Diagnosis not present

## 2024-08-21 NOTE — Progress Notes (Signed)
 [facility]  at [facility]   Subjective:  Ms.Reeg is a 78 y.o. female seen in consultation at the request of Johnny Garnette Knee, MD for evaluation of bronchiectasis in the setting of radiation for lung cancer.   Seen for NPV on 10/15/17. Referred to us  for significant sputum production and radiation changes (had bronch in 2018 to confirm no airway recurrence). Overall doing ok but had daily cough. In the past, would get hospitalized every year or so due to pneumonia. Plan to increase AC w/7% HTS, albuterol /acapella, evaluate for aspiration with barium swallow. At 07/08/2018 follow up, on 3% HTS, Flovent  prn, PFTs improved. Aerobika added, told to stop ICS. Met with Jodie Jones as well. RPV on 11/2018, doing ok, but increased dyspnea and some cough. Inconsistent Aerobika use. Interval Cipro  course for PSA--poorly tolerated. Plan to go up to daily Aerobika, use albuterol  prior to 3% HTS. RPV on 04/12/20, interval worsening of cough in summer 2020, Levaquin  helped. Increased DOE in spring 2021, CT w/new RLL consolidation. Not doing AC. Plan to start AC again (alb/3% HTS), Aerobika.  2 weeks of Levaquin , check cultures. Swallow eval and GI referral for zenker's diverticulum on prior eval. Atopic w/u for ? Asthma. RPV on 05/02/21, increasing cough since spring (early April), minimal response to abx, not doing AC. Lots of fatigue and weight loss in past 2 months (15 pounds). Plan to restart airway clearance, PCP w/u of NS, add spacer to Symbicort .  RPV 06/16/21, quick follow up, cough had improved but then worsening. Doing airway clearance but nothing productive. NS stopped. C/f brx exacerbation. 10d of Levaquin . AC reinforced to do albuterol  --> Saline (was doing opposite before). RPV on 12/15/21, on Symbicort  and AC at bedtime, PFTs stable, some increased coughing. Plan to use Symbicort  more consistently. Follow up with Dr. Jama since overdue. RPV on 01/31/24,  recurrent worsening's, but doing ok on the day of visit. Induced sputum in clinic, respiratory training. Abx if not improving. R sided pain, ? MSK related. RPV on 04/24/24, improved cough with symbicort  consistency. Increased in week prior to visit (some wheezes), increase symbicort  to 5 puffs total a dayto see if can avoid pred. No need for albuterol  HFA + neb.   CC: Bronchiectasis follow up  HPI: Interval events per epic and care everywhere: -04/28/24--no improve with symbicort , pred 20mg  x 5 days. Follow up phone call on 6/6 and doing much better.  -July--tore meniscus. Surgery in August.  -Doxycycline  on 8/20 for sore throat -PCP visit, 07/28/24--2 weeks of fevers, increased cough (non productive). 10 more days of doxycycline .  -08/12/24--onc follow up with Dr. Jama. CT reviewed. Stable.   Today, unaccompanied -was doing well until mucus built up after the surgery -10 days of doxycycline , which helped. Just finished 2nd course. Feeling much better but still some coughing.  -breathing and energy is better, almost back to normal; doing exercise program at [place]. Before knee surgery -phlegm is thick but yellow    Respiratory Symptoms:  Cough: productive of yellow sputum.   Nocturnal awakenings: absent. Wheezing: resolved. Chest tightness: improved compared to last month Rescue albuterol  use: once or twice in the past month. Pleurisy: absent. Hemoptysis: absent. MMRC: 1 = I get short of breath when hurrying on level ground or walking up a slight hill.  Exacerbations:  Number of exacerbations treated in the past year: 2 Dates of exacerbations: Jan 2025; August 2025 Number of hospitalizations for exacerbations in the past 2  years: none Dates of hospitalizations: none  Pulmonary Therapies:  Airway clearance: albuterol  neb --> 3% saline with Aerobika, BID Inhaled antibiotics: none. Inhalers: ICS/LABA (high dose Symbicort ) Chronic antibiotics: none Exercise: walking Pulmonary  Rehab: none Supplemental oxygen: none NIPPV: none   Review of Systems All other systems reviewed are negative.   Medical, Social, Family hx and medications/allergies updated as below on 08/21/2024 Past Medical and Surgical History   NSCLC stage 3b  -RUL and RML  -chemoradiation in 2013  -seen in ipulm clinic in 07/2017 for possible bronchial recurrence --> bronch with RML stenosis and BI mucus plug  -surveillance CTs changed to screening CT in spring 2019 PE on rivaroxaban   -diagnosed 12/2012   -had previously had DVT in setting of surgery Diastolic Dysfunction  Possible Zenker's Diverticulum on barium swallow 2019   Allergies Allergies  Allergen Reactions  . Ciprofloxacin  Other (See Comments)    Swelling and joint pain Diffuse polyarthropathy, joint effusions, pain across UE and LE with Cipro  750 mg. Fluoroquinolone class avoidance recommended.   . Codeine Other (See Comments)    Pt states she becomes confused and knocks her out for days  . Morphine Other (See Comments)    Confusion and knocks her out for days  . Ondansetron  Hcl Other (See Comments)    Patient states she should not take Zofran  due to her heart.  . Prednisone  Other (See Comments)    Very activating: increase heart rate  . Promethazine Other (See Comments)    Patient states was during a hospitalization was taken off but unsure of the reason  . Polymyxin B  Other (See Comments)    Eye irritation  . Trimethoprim  Other (See Comments)    Eye irritation   . Opioids - Morphine Analogues Other (See Comments)    Hard time waking up when taking morphine, codeine, and related medications    Medications  Current Outpatient Medications:  .  acetaminophen  (TYLENOL ) 500 MG tablet, Take 2 tablets (1,000 mg total) by mouth every eight (8) hours as needed., Disp: , Rfl:  .  albuterol  2.5 mg /3 mL (0.083 %) nebulizer solution, USE 1 VIAL IN NEBULIZER TWICE DAILY, Disp: 180 mL, Rfl: 5 .  albuterol  HFA 90 mcg/actuation  inhaler, Inhale 2 puffs every four (4) hours as needed for wheezing or shortness of breath., Disp: 8.5 g, Rfl: 11 .  benzonatate  (TESSALON ) 100 MG capsule, Take 1 capsule (100 mg total) by mouth., Disp: , Rfl:  .  cyanocobalamin , vitamin B-12, 1,000 mcg/mL injection, Inject 1 mL (1,000 mcg total) into the muscle., Disp: , Rfl:  .  mucus clearing device Devi, Please dispense one Aerobika to be used twice daily for airway clearance. Length of Use: 99 months, Disp: 1 Device, Rfl: 1 .  rivaroxaban  (XARELTO ) 20 mg tablet, Take 1 tablet (20 mg total) by mouth daily with evening meal., Disp: 30 tablet, Rfl: 7 .  rivaroxaban  (XARELTO ) 20 mg tablet, TAKE 1 TABLET BY MOUTH DAILY WITH SUPPER, Disp: , Rfl:  .  sodium chloride  3 % NEBULIZER solution, Inhale by nebulization two (2) times a day., Disp: 720 mL, Rfl: 3 .  SYMBICORT  160-4.5 mcg/actuation inhaler, Inhale 2 puffs  in the morning and 2 puffs in the evening., Disp: 10.2 g, Rfl: 11  Recent Abx: 10/2017, 06/2018  Social History 50 years at 0.5 PPD, quit in 2013 with lung CA diagnosis.  She worked as a Tree surgeon, lots of inhaled scents. Lives with husband, 66, healthy (distant CABG). Now back in  her own home.  Olam having back surgery.  Three kids, youngest Giles) usually visits. Lisa's husband passed due to complications of a BMT. No kids with lung issues.  Alcohol: Glass of wine every now and then Drugs: No other drugs  Family History No family history of lung diseases.  Most Recent Immunizations  Administered Date(s) Administered  . COVID-19 VAC,BIVALENT(53YR UP),PFIZER 11/02/2021  . COVID-19 VACC,MRNA,(PFIZER)(PF) 06/23/2021  . Covid-19 Vac, (27yr+) (Comirnaty) Mrna Pfizer  08/15/2023  . Influenza TRI (IIV3) 4+yrs PF 10/02/2013  . Influenza Virus Vaccine, unspecified formulation 09/26/2021    Objective:  Physical Exam: BP 140/84 (BP Site: L Arm, BP Position: Sitting, BP Cuff Size: Small)   Pulse 85   Temp 36.7 C (98 F) (Temporal)    Wt 62.6 kg (138 lb)   SpO2 98%   BMI 22.62 kg/m   Wt Readings from Last 12 Encounters:  08/21/24 62.6 kg (138 lb)  08/12/24 64 kg (141 lb 1.6 oz)  04/24/24 61.9 kg (136 lb 8 oz)  01/31/24 62.6 kg (138 lb)  11/28/23 63 kg (139 lb)  08/07/23 61.8 kg (136 lb 4.8 oz)  12/20/22 63.5 kg (140 lb)  08/09/22 65.4 kg (144 lb 3.2 oz)  01/17/22 66.5 kg (146 lb 8 oz)  12/15/21 65.3 kg (144 lb)  06/16/21 64.4 kg (142 lb)  05/02/21 62.4 kg (137 lb 9.6 oz)    Constitutional: Well appearing, well nourished, non-toxic, appears as stated age Eyes: PERRL, anicteric sclerae, clear conjunctiva, EOMs intact Ears, Nose, Mouth, and Throat: Clear oropharynx, dentures present.  Cardiovascular: S1S2, no MRG, displaced towards right, regular, warm and well perfused extremities, trace LE edema Respiratory: ++ cough with deeper breaths, slight wheezes, more on the R than the left Musculoskeletal: No carpal, MCP, DIP, or PIP swelling or tenderness, no clubbing or cyanosis.  Skin: No rashes evident, no bruising or petechiae Neurologic: Normal gait, alert and oriented, conversant, appropriate affect. Non focal neuro exam.   Diagnostic Review:  Spirometry:  Pulmonary Function Test Results Date FVC FEV1 FEV1/FVC TLC DLCO Other  10/15/17 2.09/71% 1.5/67.4% 72   Moderate impairment, likely restriction  07/08/18 2.21/76 1.60/73 72   Normal spirometry  04/12/20 2.10/78.3 1.52/73.9 72   Normal spirometry  12/15/21 2.15/81.2 1.51/74.3 70   Stable, normal spiro  04/14/24 2.11/82 1.47/75 70   Stable   Interval data 08/06/24, chest CT, images reviewed -agree with report, very stable compared to Jan 2025 -no evidence of chronic infection as well   Prior notable data Chest CT, 11/28/23 and 08/07/23, images personally reviewed -significant stable R perihilar radiation fibrosis with persistent RML collapse -some tree in bud in the surrounding RLL -mild bi apical emphysema   Chest CT, 08/17/21 -very stable  -stable  radiation changes, no consolidation   Chest CT, 04/11/21--images obtained and reviewed after last visit  -dense, paramedian fibrosis -mild air trapping -mild increase in GGO at R base compared to prior (near fibrosis)  Chest CT 03/17/20, images personally reviewed -R para mediastinal post radiation fibrosis with RML opacification; stable -new patchy consolidation in posterior RLL  Chest CT 01/08/18 (images personally reviewed) R lung distortion w/para mediastinal fibrosis and complete loss of RML RLL traction bronchiectasis Opacities, unchanged  Barium Swallow 01/08/18 Trace laryngeal penetration, no aspiration Tiny Zenker's diverticulum  Chest CT: 09/04/17 (report reviewed, images personally viewed by me) Interval resolution of sub cm nodular opacity on the anterior BI New 0.3 cm nodular opacity in trachea Stable R para mediastinal radiation fibrosis, RML opacification and  volume loss Stable R apical nodule, 0.3 cm, same since 2013  Loss of volume on R side  Chest CTs reviewed from 2013 until now -slow evolution of radiation changes with emergence of fibrosis, RML loss, and mediastinal shift  Culture results: 03/20/16: AFB negative, BAL from RUL 07/08/18 cx; LRTCx. 2+ Mu PSA, OPF. PSA pan S. ; AFB Cx neg   Assessment & Plan 78 y.o. woman with stage 3B NSCLC w/cure (2013 chemoradiation), PE on anticoagulation who follows for chronic cough due to a combination of asthma and post radiation associated bronchiectasis. Since last visit, has done mostly well with doing [facility] as prescribed--albuterol -->HTS with Aerobika followed by Symbicort  but some persistent coughing since likely exacerbation in late August following meniscal repair. Suspect the worsening was due to less exercise around surgery --> increased mucus retention.  Bronchiectasis with recent exacerbation c/b asthma overlap as well  Recent exacerbation post-knee surgery with increased mucus, decreased oxygen saturation, and  fatigue. Partial relief with doxycycline . Persistent symptoms include productive cough, hoarseness, and occasional chest tightness. Oxygen saturation improved to 98%. CT scan showed no significant issues from last week and no evidence of chronic infection - Prescribe prednisone  20 mg daily for 5 days since non purulent sputum today  - Continue Symbicort  with spacer. 2 puffs BID.  - Use albuterol  with saline as needed. - Monitor sputum color and volume; contact provider if it changes to darker yellow, green, or brown, or if fatigue worsens. - Send sputum sample for analysis. - Administer flu vaccine next week. - Follow up in 6 months or sooner if symptoms worsen.  Bronchiectasis Chronic bronchiectasis with recurrent symptoms every 3-4 months. Inconsistent nebulizer and inhaler use may contribute to recurrence. CT scan shows no significant changes since 2023 (2024 and 2025 reviewed). Emphasized consistent medication use and airway clearance techniques will be key to prevent worsening's. She does have her sheet that I hand wrote years ago about how to do [facility] but suspect she is not doing Symbicort  as consistently as she should be since only picked up in 08/2023 in the past year. Much improved course - Continue current regimen of albuterol  neb followed by saline nebulization with Aerobika twice daily.  History of NSCLC stage 3b, RUL and RML Chemoradiation in 2013 ; Bronch 07/2017 confirmed no recurrence; imaging stable and continue to follow with Dr. Elenor Ruth from [facility] oncology. Recent CT w/o evidence of recurrence     I invited Ms. Sethi to follow up with us  in 6 months time. She has our clinic information if questions or issues arise before. I encouraged her to use My Chart as well for any questions. All questions were answered.  I personally spent 32 minutes face-to-face and non-face-to-face in the care of this patient, which includes all pre, intra, and post visit time on the date of service.   All documented time was specific to the E/M visit and does not include any procedures that may have been performed.    Pauline Blanch, MD PhD Assistant Professor Pulmonary and Critical Care Medicine

## 2024-08-24 ENCOUNTER — Other Ambulatory Visit: Payer: Self-pay

## 2024-08-24 ENCOUNTER — Encounter (HOSPITAL_BASED_OUTPATIENT_CLINIC_OR_DEPARTMENT_OTHER): Payer: Self-pay | Admitting: Emergency Medicine

## 2024-08-24 ENCOUNTER — Emergency Department (HOSPITAL_BASED_OUTPATIENT_CLINIC_OR_DEPARTMENT_OTHER)

## 2024-08-24 ENCOUNTER — Emergency Department (HOSPITAL_BASED_OUTPATIENT_CLINIC_OR_DEPARTMENT_OTHER): Admitting: Radiology

## 2024-08-24 ENCOUNTER — Inpatient Hospital Stay (HOSPITAL_BASED_OUTPATIENT_CLINIC_OR_DEPARTMENT_OTHER)
Admission: EM | Admit: 2024-08-24 | Discharge: 2024-08-28 | DRG: 191 | Disposition: A | Discharge status: Home / Self Care | Attending: Internal Medicine | Admitting: Internal Medicine

## 2024-08-24 DIAGNOSIS — R911 Solitary pulmonary nodule: Secondary | ICD-10-CM | POA: Diagnosis not present

## 2024-08-24 DIAGNOSIS — Z888 Allergy status to other drugs, medicaments and biological substances status: Secondary | ICD-10-CM

## 2024-08-24 DIAGNOSIS — Z87891 Personal history of nicotine dependence: Secondary | ICD-10-CM

## 2024-08-24 DIAGNOSIS — Z8249 Family history of ischemic heart disease and other diseases of the circulatory system: Secondary | ICD-10-CM

## 2024-08-24 DIAGNOSIS — Z883 Allergy status to other anti-infective agents status: Secondary | ICD-10-CM

## 2024-08-24 DIAGNOSIS — I5032 Chronic diastolic (congestive) heart failure: Secondary | ICD-10-CM | POA: Diagnosis not present

## 2024-08-24 DIAGNOSIS — M797 Fibromyalgia: Secondary | ICD-10-CM | POA: Diagnosis not present

## 2024-08-24 DIAGNOSIS — R042 Hemoptysis: Secondary | ICD-10-CM | POA: Diagnosis not present

## 2024-08-24 DIAGNOSIS — F32A Depression, unspecified: Secondary | ICD-10-CM | POA: Diagnosis present

## 2024-08-24 DIAGNOSIS — Z885 Allergy status to narcotic agent status: Secondary | ICD-10-CM | POA: Diagnosis not present

## 2024-08-24 DIAGNOSIS — M353 Polymyalgia rheumatica: Secondary | ICD-10-CM | POA: Diagnosis present

## 2024-08-24 DIAGNOSIS — Z7901 Long term (current) use of anticoagulants: Secondary | ICD-10-CM | POA: Diagnosis not present

## 2024-08-24 DIAGNOSIS — J439 Emphysema, unspecified: Secondary | ICD-10-CM | POA: Diagnosis present

## 2024-08-24 DIAGNOSIS — Z820 Family history of epilepsy and other diseases of the nervous system: Secondary | ICD-10-CM

## 2024-08-24 DIAGNOSIS — Z9221 Personal history of antineoplastic chemotherapy: Secondary | ICD-10-CM

## 2024-08-24 DIAGNOSIS — I739 Peripheral vascular disease, unspecified: Secondary | ICD-10-CM | POA: Diagnosis not present

## 2024-08-24 DIAGNOSIS — I110 Hypertensive heart disease with heart failure: Secondary | ICD-10-CM | POA: Diagnosis not present

## 2024-08-24 DIAGNOSIS — Z881 Allergy status to other antibiotic agents status: Secondary | ICD-10-CM

## 2024-08-24 DIAGNOSIS — Z86711 Personal history of pulmonary embolism: Secondary | ICD-10-CM | POA: Diagnosis not present

## 2024-08-24 DIAGNOSIS — Z923 Personal history of irradiation: Secondary | ICD-10-CM | POA: Diagnosis not present

## 2024-08-24 DIAGNOSIS — Z79899 Other long term (current) drug therapy: Secondary | ICD-10-CM | POA: Diagnosis not present

## 2024-08-24 DIAGNOSIS — Z7951 Long term (current) use of inhaled steroids: Secondary | ICD-10-CM | POA: Diagnosis not present

## 2024-08-24 DIAGNOSIS — Z8261 Family history of arthritis: Secondary | ICD-10-CM

## 2024-08-24 DIAGNOSIS — I10 Essential (primary) hypertension: Secondary | ICD-10-CM | POA: Diagnosis present

## 2024-08-24 DIAGNOSIS — E785 Hyperlipidemia, unspecified: Secondary | ICD-10-CM | POA: Diagnosis present

## 2024-08-24 DIAGNOSIS — Z86718 Personal history of other venous thrombosis and embolism: Secondary | ICD-10-CM

## 2024-08-24 DIAGNOSIS — J45909 Unspecified asthma, uncomplicated: Secondary | ICD-10-CM | POA: Diagnosis present

## 2024-08-24 DIAGNOSIS — Z85118 Personal history of other malignant neoplasm of bronchus and lung: Secondary | ICD-10-CM

## 2024-08-24 DIAGNOSIS — J479 Bronchiectasis, uncomplicated: Secondary | ICD-10-CM | POA: Diagnosis not present

## 2024-08-24 DIAGNOSIS — R9389 Abnormal findings on diagnostic imaging of other specified body structures: Secondary | ICD-10-CM | POA: Diagnosis not present

## 2024-08-24 DIAGNOSIS — C3490 Malignant neoplasm of unspecified part of unspecified bronchus or lung: Secondary | ICD-10-CM | POA: Diagnosis not present

## 2024-08-24 DIAGNOSIS — R079 Chest pain, unspecified: Secondary | ICD-10-CM | POA: Diagnosis not present

## 2024-08-24 DIAGNOSIS — Z832 Family history of diseases of the blood and blood-forming organs and certain disorders involving the immune mechanism: Secondary | ICD-10-CM

## 2024-08-24 DIAGNOSIS — R0789 Other chest pain: Secondary | ICD-10-CM | POA: Diagnosis not present

## 2024-08-24 DIAGNOSIS — Z800 Family history of malignant neoplasm of digestive organs: Secondary | ICD-10-CM

## 2024-08-24 LAB — CBC
HCT: 39.7 % (ref 36.0–46.0)
Hemoglobin: 13.5 g/dL (ref 12.0–15.0)
MCH: 30.4 pg (ref 26.0–34.0)
MCHC: 34 g/dL (ref 30.0–36.0)
MCV: 89.4 fL (ref 80.0–100.0)
Platelets: 229 K/uL (ref 150–400)
RBC: 4.44 MIL/uL (ref 3.87–5.11)
RDW: 13.2 % (ref 11.5–15.5)
WBC: 7.9 K/uL (ref 4.0–10.5)
nRBC: 0 % (ref 0.0–0.2)

## 2024-08-24 LAB — TROPONIN T, HIGH SENSITIVITY
Troponin T High Sensitivity: <15 ng/L (ref 0–19)
Troponin T High Sensitivity: <15 ng/L (ref 0–19)

## 2024-08-24 LAB — BASIC METABOLIC PANEL WITH GFR
Anion gap: 14 (ref 5–15)
BUN: 20 mg/dL (ref 8–23)
CO2: 24 mmol/L (ref 22–32)
Calcium: 10.2 mg/dL (ref 8.9–10.3)
Chloride: 103 mmol/L (ref 98–111)
Creatinine, Ser: 0.94 mg/dL (ref 0.44–1.00)
GFR, Estimated: >60 mL/min (ref >60)
Glucose, Bld: 115 mg/dL — ABNORMAL HIGH (ref 70–99)
Potassium: 3.7 mmol/L (ref 3.5–5.1)
Sodium: 141 mmol/L (ref 135–145)

## 2024-08-24 MED ORDER — IOHEXOL 350 MG/ML SOLN
75.0000 mL | Freq: Once | INTRAVENOUS | Status: AC | PRN
  Administered 2024-08-24: 75 mL via INTRAVENOUS

## 2024-08-24 NOTE — ED Notes (Signed)
 Patient transported to CT

## 2024-08-24 NOTE — ED Triage Notes (Signed)
 Pt c/o coughing up blood that started x 1 hour pta with central chest pain. Takes xarelto  for hx of PE. Pt states that she is normally shob since having lung cancer.

## 2024-08-24 NOTE — ED Notes (Signed)
 Patient at xray

## 2024-08-25 ENCOUNTER — Ambulatory Visit

## 2024-08-25 DIAGNOSIS — Z86718 Personal history of other venous thrombosis and embolism: Secondary | ICD-10-CM | POA: Diagnosis not present

## 2024-08-25 DIAGNOSIS — I739 Peripheral vascular disease, unspecified: Secondary | ICD-10-CM | POA: Diagnosis not present

## 2024-08-25 DIAGNOSIS — J471 Bronchiectasis with (acute) exacerbation: Secondary | ICD-10-CM

## 2024-08-25 DIAGNOSIS — I110 Hypertensive heart disease with heart failure: Secondary | ICD-10-CM | POA: Diagnosis not present

## 2024-08-25 DIAGNOSIS — E785 Hyperlipidemia, unspecified: Secondary | ICD-10-CM | POA: Diagnosis not present

## 2024-08-25 DIAGNOSIS — Z885 Allergy status to narcotic agent status: Secondary | ICD-10-CM | POA: Diagnosis not present

## 2024-08-25 DIAGNOSIS — I5032 Chronic diastolic (congestive) heart failure: Secondary | ICD-10-CM | POA: Diagnosis not present

## 2024-08-25 DIAGNOSIS — J439 Emphysema, unspecified: Secondary | ICD-10-CM | POA: Diagnosis not present

## 2024-08-25 DIAGNOSIS — F32A Depression, unspecified: Secondary | ICD-10-CM | POA: Diagnosis present

## 2024-08-25 DIAGNOSIS — Z85118 Personal history of other malignant neoplasm of bronchus and lung: Secondary | ICD-10-CM | POA: Diagnosis not present

## 2024-08-25 DIAGNOSIS — J45909 Unspecified asthma, uncomplicated: Secondary | ICD-10-CM | POA: Diagnosis present

## 2024-08-25 DIAGNOSIS — Z79899 Other long term (current) drug therapy: Secondary | ICD-10-CM | POA: Diagnosis not present

## 2024-08-25 DIAGNOSIS — Z881 Allergy status to other antibiotic agents status: Secondary | ICD-10-CM | POA: Diagnosis not present

## 2024-08-25 DIAGNOSIS — Z8249 Family history of ischemic heart disease and other diseases of the circulatory system: Secondary | ICD-10-CM | POA: Diagnosis not present

## 2024-08-25 DIAGNOSIS — Z7901 Long term (current) use of anticoagulants: Secondary | ICD-10-CM | POA: Diagnosis not present

## 2024-08-25 DIAGNOSIS — Z888 Allergy status to other drugs, medicaments and biological substances status: Secondary | ICD-10-CM | POA: Diagnosis not present

## 2024-08-25 DIAGNOSIS — J479 Bronchiectasis, uncomplicated: Secondary | ICD-10-CM | POA: Diagnosis not present

## 2024-08-25 DIAGNOSIS — R042 Hemoptysis: Secondary | ICD-10-CM | POA: Diagnosis not present

## 2024-08-25 DIAGNOSIS — Z9221 Personal history of antineoplastic chemotherapy: Secondary | ICD-10-CM | POA: Diagnosis not present

## 2024-08-25 DIAGNOSIS — Z923 Personal history of irradiation: Secondary | ICD-10-CM | POA: Diagnosis not present

## 2024-08-25 DIAGNOSIS — M797 Fibromyalgia: Secondary | ICD-10-CM | POA: Diagnosis not present

## 2024-08-25 DIAGNOSIS — M353 Polymyalgia rheumatica: Secondary | ICD-10-CM | POA: Diagnosis not present

## 2024-08-25 DIAGNOSIS — Z87891 Personal history of nicotine dependence: Secondary | ICD-10-CM | POA: Diagnosis not present

## 2024-08-25 DIAGNOSIS — Z7951 Long term (current) use of inhaled steroids: Secondary | ICD-10-CM | POA: Diagnosis not present

## 2024-08-25 DIAGNOSIS — Z86711 Personal history of pulmonary embolism: Secondary | ICD-10-CM | POA: Diagnosis not present

## 2024-08-25 LAB — COMPREHENSIVE METABOLIC PANEL WITH GFR
ALT: 12 U/L (ref 0–44)
AST: 21 U/L (ref 15–41)
Albumin: 4.4 g/dL (ref 3.5–5.0)
Alkaline Phosphatase: 55 U/L (ref 38–126)
Anion gap: 13 (ref 5–15)
BUN: 15 mg/dL (ref 8–23)
CO2: 22 mmol/L (ref 22–32)
Calcium: 10.2 mg/dL (ref 8.9–10.3)
Chloride: 106 mmol/L (ref 98–111)
Creatinine, Ser: 0.7 mg/dL (ref 0.44–1.00)
GFR, Estimated: >60 mL/min (ref >60)
Glucose, Bld: 96 mg/dL (ref 70–99)
Potassium: 4.2 mmol/L (ref 3.5–5.1)
Sodium: 141 mmol/L (ref 135–145)
Total Bilirubin: 0.4 mg/dL (ref 0.0–1.2)
Total Protein: 7.2 g/dL (ref 6.5–8.1)

## 2024-08-25 LAB — MAGNESIUM: Magnesium: 2.4 mg/dL (ref 1.7–2.4)

## 2024-08-25 LAB — EXPECTORATED SPUTUM ASSESSMENT W GRAM STAIN, RFLX TO RESP C

## 2024-08-25 LAB — HEMOGLOBIN A1C
Hgb A1c MFr Bld: 4.7 % — ABNORMAL LOW (ref 4.8–5.6)
Mean Plasma Glucose: 88.19 mg/dL

## 2024-08-25 LAB — PHOSPHORUS: Phosphorus: 3.1 mg/dL (ref 2.5–4.6)

## 2024-08-25 LAB — PRO BRAIN NATRIURETIC PEPTIDE: Pro Brain Natriuretic Peptide: 139 pg/mL (ref <300.0)

## 2024-08-25 LAB — TSH: TSH: 0.828 u[IU]/mL (ref 0.350–4.500)

## 2024-08-25 MED ORDER — GUAIFENESIN ER 600 MG PO TB12
600.0000 mg | ORAL_TABLET | Freq: Two times a day (BID) | ORAL | Status: DC
  Administered 2024-08-25: 600 mg via ORAL
  Filled 2024-08-25: qty 1

## 2024-08-25 MED ORDER — SERTRALINE HCL 50 MG PO TABS
50.0000 mg | ORAL_TABLET | Freq: Every day | ORAL | Status: DC
Start: 2024-08-25 — End: 2024-08-25

## 2024-08-25 MED ORDER — ONDANSETRON HCL 4 MG PO TABS: 4.0000 mg | ORAL_TABLET | Freq: Four times a day (QID) | ORAL | Status: DC | PRN

## 2024-08-25 MED ORDER — SODIUM CHLORIDE 0.9 % IV SOLN
1.0000 g | Freq: Every day | INTRAVENOUS | Status: DC
  Administered 2024-08-25 – 2024-08-27 (×3): 1 g via INTRAVENOUS
  Filled 2024-08-25 (×3): qty 10

## 2024-08-25 MED ORDER — ONDANSETRON HCL 4 MG/2ML IJ SOLN: 4.0000 mg | Freq: Four times a day (QID) | INTRAMUSCULAR | Status: DC | PRN

## 2024-08-25 MED ORDER — ACETAMINOPHEN 325 MG PO TABS: 650.0000 mg | ORAL_TABLET | Freq: Four times a day (QID) | ORAL | Status: DC | PRN

## 2024-08-25 MED ORDER — LEVALBUTEROL HCL 0.63 MG/3ML IN NEBU
0.6300 mg | INHALATION_SOLUTION | Freq: Four times a day (QID) | RESPIRATORY_TRACT | Status: DC | PRN
  Administered 2024-08-26 (×2): 0.63 mg via RESPIRATORY_TRACT
  Filled 2024-08-25 (×2): qty 3

## 2024-08-25 MED ORDER — FLUTICASONE FUROATE-VILANTEROL 200-25 MCG/ACT IN AEPB
1.0000 | INHALATION_SPRAY | Freq: Every day | RESPIRATORY_TRACT | Status: DC
Start: 2024-08-25 — End: 2024-08-28
  Administered 2024-08-25 – 2024-08-28 (×4): 1 via RESPIRATORY_TRACT
  Filled 2024-08-25: qty 28

## 2024-08-25 MED ORDER — DEXTROMETHORPHAN POLISTIREX ER 30 MG/5ML PO SUER
30.0000 mg | Freq: Three times a day (TID) | ORAL | Status: DC
  Administered 2024-08-25 – 2024-08-27 (×8): 30 mg via ORAL
  Filled 2024-08-25 (×10): qty 5

## 2024-08-25 MED ORDER — ACETAMINOPHEN 650 MG RE SUPP: 650.0000 mg | Freq: Four times a day (QID) | RECTAL | Status: DC | PRN

## 2024-08-25 NOTE — Progress Notes (Signed)
 [facility] PROGRESS NOTE    Progress Note  [name]  FMW:[id number] DOB: Nov 26, 1946 DOA: 08/24/2024 PCP: Johnny Garnette LABOR, MD     Brief Narrative:   [name] is an 78 y.o. female past medical history significant for chronic cough probably combination of asthma and post radiation associated bronchiectasis, history of lung cancer non-small cell stage IIIb, tobacco abuse, history of DVT/PE on Xarelto , HFpEF, essential hypertension, she was recently treated for an asthma/bronchiectasis exacerbation with prednisone  and doxycycline  was reevaluated by pulmonary where she started had mild persistent symptoms so she was given additional steroid treatment, now presents to the ED with hemoptysis central chest pain.  Assessment/Plan:   Hemoptysis With a history of radiation bronchiectasis.  She recently has been treated with antibiotics and steroids.  CT angio of the chest was negative for PE but it does show a near complete consolidation of the right middle lobe with perihilar fibrosis also several clusters of nodular density in the posterior right lower lobe suspicious for developing infiltrate She is satting greater 93% on room air.  Related no dyspnea. As she has remained afebrile with no leukocytosis antibiotics have been held. Pulmonary has been consulted. Hemoglobin is stable.  Persistent asthma: Not in exacerbation continue inhalers.  History of DVT and PE: CT angio of the chest negative for PE. Due to her hemoptysis will continue to hold Xarelto .  HFpEF: Not in exacerbation continue current home meds.  Essential hypertension Resume home meds.  Hyperlipidemia: Not on a statin at home will ask pharmacy to to review med rec.   DVT prophylaxis: SCD Family Communication:none Status is: Observation The patient remains OBS appropriate and will d/c before 2 midnights.    Code Status:     Code Status Orders  (From admission, onward)           Start      Ordered   08/25/24 0440  Full code  Continuous       Question:  By:  Answer:  Default: patient does not have capacity for decision making, no surrogate or prior directive available   08/25/24 0444           Code Status History     Date Active Date Inactive Code Status Order ID Comments User Context   02/04/2022 1623 02/05/2022 1923 Full Code [id number]  Celinda Alm Lot, MD Inpatient   01/22/2013 1528 01/24/2013 1308 Full Code [id number]  RamaTawni SQUIBB, MD Inpatient   05/26/2012 2139 05/30/2012 1917 Full Code [id number]  Raina Benjamin BROCKS, RN Inpatient      Advance Directive Documentation    Flowsheet Row Most Recent Value  Type of Advance Directive Healthcare Power of Attorney  Pre-existing out of facility DNR order (yellow form or pink MOST form) --  MOST Form in Place? --      IV Access:   Peripheral IV   Procedures and diagnostic studies:   CT Angio Chest PE W and/or Wo Contrast Result Date: 08/24/2024 CLINICAL DATA:  Chest pain. History of lung cancer. Concern for pulmonary embolism. EXAM: CT ANGIOGRAPHY CHEST WITH CONTRAST TECHNIQUE: Multidetector CT imaging of the chest was performed using the standard protocol during bolus administration of intravenous contrast. Multiplanar CT image reconstructions and MIPs were obtained to evaluate the vascular anatomy. RADIATION DOSE REDUCTION: This exam was performed according to the departmental dose-optimization program which includes automated exposure control, adjustment of the mA and/or kV according to patient size and/or use of iterative reconstruction technique. CONTRAST:  75mL  OMNIPAQUE  IOHEXOL  350 MG/ML SOLN COMPARISON:  Chest radiograph dated 08/24/2024 and CT dated 04/11/2021. FINDINGS: Cardiovascular: There is no cardiomegaly or pericardial effusion. Mild atherosclerotic calcification of the thoracic aorta. No aneurysmal dilatation or dissection. Evaluation of the pulmonary arteries is limited due to respiratory motion.  No pulmonary artery embolus identified. Mediastinum/Nodes: No obvious hilar or mediastinal adenopathy. Evaluation of the right hilum however is limited due to postsurgical changes and consolidation of the right middle lobe. The esophagus is grossly unremarkable. No mediastinal fluid collection. Lungs/Pleura: Post treatment changes with near complete consolidation and volume loss in the right middle lobe and right perihilar fibrosis. Several clusters of nodular density in the posterior right lower lobe (83/6) suspicious for developing infiltrate. Clinical correlation and follow-up recommended. No pleural effusion pneumothorax. The central airways are patent. Upper Abdomen: No acute abnormality. Musculoskeletal: Osteopenia with degenerative changes. No acute osseous pathology. Review of the MIP images confirms the above findings. IMPRESSION: 1. No CT evidence of pulmonary embolism. 2. Post treatment changes with near complete consolidation and volume loss in the right middle lobe and right perihilar fibrosis. 3. Several clusters of nodular density in the posterior right lower lobe suspicious for developing infiltrate. 4.  Aortic Atherosclerosis (ICD10-I70.0). Electronically Signed   By: Vanetta Chou M.D.   On: 08/24/2024 21:21   DG Chest 2 View Result Date: 08/24/2024 CLINICAL DATA:  Lung cancer depression presenting with chest pain, hemoptysis, chronic shortness of breath. EXAM: CHEST - 2 VIEW COMPARISON:  PA Lat chest series 09/30/23, 03/05/23. FINDINGS: Heart size and vascular pattern are normal. There is a stable mediastinal configuration with right-sided volume loss and shift of the mediastinum to the right. Also stable is masslike right hilar fullness and surrounding architectural distortion consistent with posttreatment changes. There are adjacent linear atelectasis or scarring changes also seen previously with tenting and elevation of right hemidiaphragm. No new opacity concerning for infiltrate is seen.  The sulci are sharp. There are cholecystectomy clips. There is kyphosis and mild degenerative change of the thoracic spine with osteopenia. IMPRESSION: 1. No evidence of acute chest disease. 2. Stable posttreatment changes in the right lung with right hilar fullness and surrounding architectural distortion. 3. Stable right-sided volume loss and shift of the mediastinum to the right. Electronically Signed   By: Francis Quam M.D.   On: 08/24/2024 20:34     Medical Consultants:   None.   Subjective:    [name] she relates she only had 3 episodes of hemoptysis that she has not had any more hemoptysis this morning.  Objective:    Vitals:   08/25/24 0100 08/25/24 0145 08/25/24 0231 08/25/24 0641  BP: 115/77 121/73 (!) 150/84 (!) 142/82  Pulse: 73 73 82 73  Resp:  18 16 14   Temp:   (!) 97.5 F (36.4 C) 98.2 F (36.8 C)  TempSrc:   Oral Oral  SpO2: 91% 93% 98% 100%  Weight:      Height:       SpO2: 100 %  No intake or output data in the 24 hours ending 08/25/24 0723 Filed Weights   08/24/24 1955  Weight: 62.2 kg    Exam: General exam: In no acute distress. Respiratory system: Good air movement and clear to auscultation. Cardiovascular system: S1 & S2 heard, RRR. No JVD. Gastrointestinal system: Abdomen is nondistended, soft and nontender.  Extremities: No pedal edema. Skin: No rashes, lesions or ulcers Psychiatry: Judgement and insight appear normal. Mood & affect appropriate.    Data  Reviewed:    Labs: Basic Metabolic Panel: Recent Labs  Lab 08/24/24 2005  NA 141  K 3.7  CL 103  CO2 24  GLUCOSE 115*  BUN 20  CREATININE 0.94  CALCIUM 10.2   GFR Estimated Creatinine Clearance: 43.7 mL/min (by C-G formula based on SCr of 0.94 mg/dL). Liver Function Tests: No results for input(s): AST, ALT, ALKPHOS, BILITOT, PROT, ALBUMIN in the last 168 hours. No results for input(s): LIPASE, AMYLASE in the last 168 hours. No results for  input(s): AMMONIA in the last 168 hours. Coagulation profile No results for input(s): INR, PROTIME in the last 168 hours. COVID-19 Labs  No results for input(s): DDIMER, FERRITIN, LDH, CRP in the last 72 hours.  Lab Results  Component Value Date   SARSCOV2NAA NEGATIVE 02/04/2022   SARSCOV2NAA Not Detected 07/07/2019    CBC: Recent Labs  Lab 08/24/24 2005  WBC 7.9  HGB 13.5  HCT 39.7  MCV 89.4  PLT 229   Cardiac Enzymes: No results for input(s): CKTOTAL, CKMB, CKMBINDEX, TROPONINI in the last 168 hours. BNP (last 3 results) No results for input(s): PROBNP in the last 8760 hours. CBG: No results for input(s): GLUCAP in the last 168 hours. D-Dimer: No results for input(s): DDIMER in the last 72 hours. Hgb A1c: No results for input(s): HGBA1C in the last 72 hours. Lipid Profile: No results for input(s): CHOL, HDL, LDLCALC, TRIG, CHOLHDL, LDLDIRECT in the last 72 hours. Thyroid  function studies: No results for input(s): TSH, T4TOTAL, T3FREE, THYROIDAB in the last 72 hours.  Invalid input(s): FREET3 Anemia work up: No results for input(s): VITAMINB12, FOLATE, FERRITIN, TIBC, IRON, RETICCTPCT in the last 72 hours. Sepsis Labs: Recent Labs  Lab 08/24/24 2005  WBC 7.9   Microbiology No results found for this or any previous visit (from the past 240 hours).   Medications:    fluticasone  furoate-vilanterol  1 puff Inhalation Daily   guaiFENesin   600 mg Oral BID   Continuous Infusions:    LOS: 0 days   Erle Odell Castor  [facility]  08/25/2024, 7:23 AM

## 2024-08-25 NOTE — Plan of Care (Signed)
   Problem: Education: Goal: Knowledge of General Education information will improve Description: Including pain rating scale, medication(s)/side effects and non-pharmacologic comfort measures Outcome: Progressing   Problem: Clinical Measurements: Goal: Will remain free from infection Outcome: Progressing   Problem: Clinical Measurements: Goal: Respiratory complications will improve Outcome: Progressing

## 2024-08-25 NOTE — Consult Note (Signed)
 NAME:  [name], MRN:  [id number], DOB:  02-03-1946, LOS: 0 ADMISSION DATE:  08/24/2024, CONSULTATION DATE:  08/25/2024  REFERRING MD:  Erle READ CHIEF COMPLAINT: Hemoptysis  History of Present Illness:  78 year old woman who follows with [facility] pulmonology for chronic cough due to combination of asthma and postradiation associated bronchiectasis.  Prior imaging has showed changes of fibrosis around the right hilum with persistent right middle lobe collapse/consolidation. [facility] notes were reviewed, she apparently has recurrent symptoms every 3 to 4 months She recently underwent meniscus repair and postop had mucus production was treated with doxycycline  for 10 days.  At her last visit 9/26, airway clearance was emphasized, she was given a short course of oral prednisone . She presented with hemoptysis, reports 5 to 7 cc of frank blood, shows me a picture on her phone CT chest showed previously noted changes of radiation with right perihilar fibrosis extending in the right middle lobe with clusters of nodular density in the right lower lobe   Per [facility] notes, Chest CT, 11/28/23 and 08/07/23 -significant stable R perihilar radiation fibrosis with persistent RML collapse -some tree in bud in the surrounding RLL -mild bi apical emphysema   Last bronchoscopy 9/18  03/20/16: AFB negative, BAL from RUL  Pertinent  Medical History  NSCLC stage 3b -RUL and RML -chemoradiation in 2013 -seen in ipulm clinic in 07/2017 for possible bronchial recurrence --> bronch with RML stenosis and BI mucus plug -surveillance CTs changed to screening CT in spring 2019 PE on rivaroxaban  -diagnosed 12/2012  -had previously had DVT in setting of surgery Diastolic Dysfunction  Possible Zenker's Diverticulum on barium swallow 2019   Significant Hospital Events: Including procedures, antibiotic start and stop dates in addition to other pertinent events     Interim History / Subjective:  Cough has decreased Today  she only brought up a small bead of dark blood  Objective    Blood pressure (!) 142/82, pulse 73, temperature 98.2 F (36.8 C), temperature source Oral, resp. rate 14, height 5' 5 (1.651 m), weight 62.2 kg, SpO2 100%.       No intake or output data in the 24 hours ending 08/25/24 1036 Filed Weights   08/24/24 1955  Weight: 62.2 kg    Examination: Gen. Pleasant, well-nourished, in no distress, normal affect ENT - no pallor,icterus, no oral source of bleeding Neck: No JVD, no thyromegaly, no carotid bruits Lungs: no use of accessory muscles, no dullness to percussion, clear without rales or rhonchi  Cardiovascular: Rhythm regular, heart sounds  normal, no murmurs or gallops, no peripheral edema Abdomen: soft and non-tender, no hepatosplenomegaly, BS normal. Musculoskeletal: No deformities, no cyanosis or clubbing Neuro:  alert, non focal   Labs show no leukocytosis, normal electrolytes, normal BNP  Resolved problem list   Assessment and Plan   Hemoptysis -likely related to bronchiectatic exacerbation , no evidence of recurrent malignancy on imaging.Changes of fibrosis perihilar and right middle lobe appear chronic.  There are nodular densities in the right lower lobe which could represent aspirated blood or infection - She has already been treated with doxycycline  and 5 days of prednisone   -Hold Xarelto  for at least 3 to 5 days -Will treat with ceftriaxone  for 5 days - DC Mucinex , use cough suppressant such as dextromethorphan 30 mg 3 times daily given her sensitivity to codeine otherwise it would be best to use low-dose codeine containing cough syrup - If she has large-volume hemoptysis we can consider bronchoscopy to localize but based on imaging  this seems to be definitely coming from the area of bronchiectasis on the right   Labs   CBC: Recent Labs  Lab 08/24/24 2005  WBC 7.9  HGB 13.5  HCT 39.7  MCV 89.4  PLT 229    Basic Metabolic Panel: Recent Labs  Lab  08/24/24 2005 08/25/24 0617  NA 141 141  K 3.7 4.2  CL 103 106  CO2 24 22  GLUCOSE 115* 96  BUN 20 15  CREATININE 0.94 0.70  CALCIUM 10.2 10.2  MG  --  2.4  PHOS  --  3.1   GFR: Estimated Creatinine Clearance: 51.3 mL/min (by C-G formula based on SCr of 0.7 mg/dL). Recent Labs  Lab 08/24/24 2005  WBC 7.9    Liver Function Tests: Recent Labs  Lab 08/25/24 0617  AST 21  ALT 12  ALKPHOS 55  BILITOT 0.4  PROT 7.2  ALBUMIN 4.4   No results for input(s): LIPASE, AMYLASE in the last 168 hours. No results for input(s): AMMONIA in the last 168 hours.  ABG    Component Value Date/Time   TCO2 33 08/03/2008 1535     Coagulation Profile: No results for input(s): INR, PROTIME in the last 168 hours.  Cardiac Enzymes: No results for input(s): CKTOTAL, CKMB, CKMBINDEX, TROPONINI in the last 168 hours.  HbA1C: Hgb A1c MFr Bld  Date/Time Value Ref Range Status  08/25/2024 06:17 AM 4.7 (L) 4.8 - 5.6 % Final    Comment:    (NOTE) Diagnosis of Diabetes The following HbA1c ranges recommended by the American Diabetes Association (ADA) may be used as an aid in the diagnosis of diabetes mellitus.  Hemoglobin             Suggested A1C NGSP%              Diagnosis  <5.7                   Non Diabetic  5.7-6.4                Pre-Diabetic  >6.4                   Diabetic  <7.0                   Glycemic control for                       adults with diabetes.    05/13/2024 02:36 PM 5.2 4.6 - 6.5 % Final    Comment:    Glycemic Control Guidelines for People with Diabetes:Non Diabetic:  <6%Goal of Therapy: <7%Additional Action Suggested:  >8%     CBG: No results for input(s): GLUCAP in the last 168 hours.  Review of Systems:   Constitutional: negative for anorexia, fevers and sweats  Eyes: negative for irritation, redness and visual disturbance  Ears, nose, mouth, throat, and face: negative for earaches, epistaxis, nasal congestion and sore throat   Respiratory: negative for cough, dyspnea on exertion, sputum and wheezing  Cardiovascular: negative for chest pain, dyspnea, lower extremity edema, orthopnea, palpitations and syncope  Gastrointestinal: negative for abdominal pain, constipation, diarrhea, melena, nausea and vomiting  Genitourinary:negative for dysuria, frequency and hematuria  Hematologic/lymphatic: negative for bleeding, easy bruising and lymphadenopathy  Musculoskeletal:negative for arthralgias, muscle weakness and stiff joints  Neurological: negative for coordination problems, gait problems, headaches and weakness  Endocrine: negative for diabetic symptoms including polydipsia, polyuria and weight loss  Past Medical History:  She,  has a past medical history of Anemia, Cancer (HCC), Cataract, Clotting disorder, Community acquired pneumonia (01/22/2013), Depression with anxiety (04/18/2022), DVT (deep venous thrombosis) (HCC), Dyspnea, Fibromyalgia, Hyperlipidemia, Hypertension, Peripheral vascular disease, PMR (polymyalgia rheumatica) (10/31/2017), PONV (postoperative nausea and vomiting), Squamous cell lung cancer (HCC) (11/27/2011), and Tobacco abuse.   Surgical History:   Past Surgical History:  Procedure Laterality Date   APPENDECTOMY     CHOLECYSTECTOMY     COLONOSCOPY     11/28/09 repeat in 10 years   ESOPHAGOGASTRODUODENOSCOPY  October 2013   severe esophagitis, done at [facility]    KNEE ARTHROSCOPY     right   LAMINECTOMY     lumbar   PORTACATH PLACEMENT     RADIOLOGY WITH ANESTHESIA N/A 01/02/2022   Procedure: MRI LUMBAR WITH AND WITHOUT WITH ANESTHESIA;  Surgeon: Radiologist, Medication, MD;  Location: [facility] OR;  Service: Radiology;  Laterality: N/A;   TONSILLECTOMY     VIDEO BRONCHOSCOPY  05/30/2012   Procedure: VIDEO BRONCHOSCOPY WITH FLUORO;  Surgeon: Belvie FORBES Silvan, MD;  Location: [facility] ENDOSCOPY;  Service: Cardiopulmonary;  Laterality: N/A;     Social History:   reports that she quit smoking about 12 years  ago. Her smoking use included cigarettes. She has never used smokeless tobacco. She reports current alcohol use of about 4.0 standard drinks of alcohol per week. She reports that she does not use drugs.   Family History:  Her family history includes Anemia in an other family member; Arthritis in an other family member; Atrial fibrillation in her mother; Cancer in her father, mother, and another family member; Colon cancer in her paternal aunt; Hypertension in her mother; Multiple sclerosis in her sister. There is no history of Colon polyps, Esophageal cancer, Stomach cancer, or Rectal cancer.   Allergies Allergies  Allergen Reactions   Ciprofloxacin  Other (See Comments)    Diffuse polyarthropathy, joint effusions, pain across UE and LE with Cipro  750 mg. Fluoroquinolone class avoidance recommended.    Promethazine Other (See Comments)    Patient states was during a hospitalization was taken off but unsure of the reason   Polymyxin B  Other (See Comments)    Eye irritation   Prednisone      Did NOT tolerate well   Trimethoprim  Other (See Comments)    Eye irritation    Morphine And Codeine Other (See Comments)    Hard time waking up when taking morphine, codeine, and related medications     Home Medications  Prior to Admission medications   Medication Sig Start Date End Date Taking? Authorizing Provider  acetaminophen  (TYLENOL ) 500 MG tablet Take 1,000 mg by mouth daily as needed for mild pain (pain score 1-3) or moderate pain (pain score 4-6).   Yes [provider]  albuterol  (PROVENTIL ) (2.5 MG/3ML) 0.083% nebulizer solution Take 3 mLs (2.5 mg total) by nebulization every 4 (four) hours as needed for wheezing or shortness of breath. Patient taking differently: Take 2.5 mg by nebulization in the morning and at bedtime. 03/20/21  Yes Johnny Garnette LABOR, MD  albuterol  (VENTOLIN  HFA) 108 (90 Base) MCG/ACT inhaler INHALE 2 PUFFS BY MOUTH EVERY 4 HOURS AS NEEDED FOR WHEEZING OR SHORTNESS OF  BREATH 12/24/22  Yes Johnny Garnette LABOR, MD  benzonatate  (TESSALON ) 100 MG capsule Take 1 capsule (100 mg total) by mouth in the morning, at noon, in the evening, and at bedtime. 12/11/23  Yes Johnny Garnette LABOR, MD  budesonide -formoterol  (SYMBICORT ) 160-4.5 MCG/ACT inhaler Inhale 2 puffs  into the lungs 2 (two) times daily. 09/12/23  Yes Johnny Garnette LABOR, MD  cyanocobalamin  (,VITAMIN B-12,) 1000 MCG/ML injection Inject 1,000 mcg into the muscle every 14 (fourteen) days.   Yes [provider]  predniSONE  (DELTASONE ) 20 MG tablet Take 20 mg by mouth daily.   Yes [provider]  rivaroxaban  (XARELTO ) 20 MG TABS tablet Take 1 tablet (20 mg total) by mouth daily with supper. 05/11/24  Yes Johnny Garnette LABOR, MD  albuterol  (PROVENTIL ) (2.5 MG/3ML) 0.083% nebulizer solution Take 3 mLs (2.5 mg total) by nebulization every 4 (four) hours as needed for wheezing or shortness of breath. 07/25/23   Johnny Garnette LABOR, MD  AREXVY 120 MCG/0.5ML injection  10/31/22   [provider]  COMIRNATY syringe  11/07/22   [provider]  doxycycline  (VIBRA -TABS) 100 MG tablet Take 1 tablet (100 mg total) by mouth 2 (two) times daily. Patient not taking: Reported on 08/25/2024 07/28/24   Johnny Garnette LABOR, MD  sodium chloride  HYPERTONIC 3 % nebulizer solution Take by nebulization as needed for other. 07/24/23   Johnny Garnette LABOR, MD       Harden Staff MD. FCCP. [facility] Pager : [phone]  If no response to pager , please call [phone] until 7 pm After 7:00 pm call Elink  [phone]   08/25/2024

## 2024-08-25 NOTE — ED Notes (Signed)
[facility] at bedside to transport  

## 2024-08-25 NOTE — Plan of Care (Signed)

## 2024-08-25 NOTE — ED Provider Notes (Signed)
 [place] [facility] AT [facility] Provider Note   CSN: [id number] Arrival date & time: 08/24/24  1945     Patient presents with: Hemoptysis and Chest Pain   [name] is a 78 y.o. female.   78 year old female presenting emergency department for hemoptysis.  Has a history of PEs on Xarelto .  Reports taking her Xarelto  this evening roughly 30 minutes later started having cough with frank hemoptysis.  She noted it was a significant amount and soaked through several paper towels.  Was having some chest discomfort, but states it is not atypical for her and that she is baseline shortness of breath.  Continues to have mild cough.  Reports bronchitis several weeks ago, but no recent URIs.   Chest Pain      Prior to Admission medications   Medication Sig Start Date End Date Taking? Authorizing Provider  acetaminophen  (TYLENOL ) 500 MG tablet Take 1,000 mg by mouth every 6 (six) hours as needed for mild pain.    [provider]  albuterol  (PROVENTIL ) (2.5 MG/3ML) 0.083% nebulizer solution Take 3 mLs (2.5 mg total) by nebulization every 4 (four) hours as needed for wheezing or shortness of breath. 03/20/21   Johnny Garnette LABOR, MD  albuterol  (PROVENTIL ) (2.5 MG/3ML) 0.083% nebulizer solution Take 3 mLs (2.5 mg total) by nebulization every 4 (four) hours as needed for wheezing or shortness of breath. 07/25/23   Johnny Garnette LABOR, MD  albuterol  (VENTOLIN  HFA) 108 (90 Base) MCG/ACT inhaler INHALE 2 PUFFS BY MOUTH EVERY 4 HOURS AS NEEDED FOR WHEEZING OR SHORTNESS OF BREATH 12/24/22   Johnny Garnette LABOR, MD  AREXVY 120 MCG/0.5ML injection  10/31/22   [provider]  benzonatate  (TESSALON ) 100 MG capsule Take 1 capsule (100 mg total) by mouth in the morning, at noon, in the evening, and at bedtime. 12/11/23   Johnny Garnette LABOR, MD  budesonide -formoterol  (SYMBICORT ) 160-4.5 MCG/ACT inhaler Inhale 2 puffs into the lungs 2 (two) times daily. 09/12/23   Johnny Garnette LABOR, MD   COMIRNATY syringe  11/07/22   [provider]  cyanocobalamin  (,VITAMIN B-12,) 1000 MCG/ML injection Inject 1,000 mcg into the muscle every 14 (fourteen) days.    [provider]  doxycycline  (VIBRA -TABS) 100 MG tablet Take 1 tablet (100 mg total) by mouth 2 (two) times daily. 07/28/24   Johnny Garnette LABOR, MD  enoxaparin  (LOVENOX ) 60 MG/0.6ML injection Inject 0.6 mLs (60 mg total) into the skin every 12 (twelve) hours. 04/18/22   Johnny Garnette LABOR, MD  erythromycin  ophthalmic ointment Apply 1/2 inch strip to the lower eyelid of both eyes four times a day for 7 days 10/02/23   Mercer Clotilda SAUNDERS, MD  ondansetron  (ZOFRAN ) 8 MG tablet Take 1 tablet (8 mg total) by mouth every 6 (six) hours as needed for nausea or vomiting. 04/27/22   Johnny Garnette LABOR, MD  rivaroxaban  (XARELTO ) 20 MG TABS tablet Take 1 tablet (20 mg total) by mouth daily with supper. 05/11/24   Johnny Garnette LABOR, MD  sertraline  (ZOLOFT ) 50 MG tablet Take 1 tablet (50 mg total) by mouth daily. 04/18/22   Johnny Garnette LABOR, MD  sodium chloride  HYPERTONIC 3 % nebulizer solution Take by nebulization as needed for other. 07/24/23   Johnny Garnette LABOR, MD  tobramycin -dexamethasone  (TOBRADEX ) ophthalmic solution Place 2 drops into both eyes every 4 (four) hours while awake. 03/31/24   Johnny Garnette LABOR, MD    Allergies: Ciprofloxacin , Promethazine, Polymyxin b , Prednisone , Trimethoprim , and Morphine and codeine  Review of Systems  Cardiovascular:  Positive for chest pain.    Updated Vital Signs BP 120/73   Pulse 81   Temp 97.8 F (36.6 C)   Resp 18   Ht 5' 5 (1.651 m)   Wt 62.2 kg   SpO2 92%   BMI 22.82 kg/m   Physical Exam Vitals and nursing note reviewed.  HENT:     Nose: Nose normal.     Mouth/Throat:     Mouth: Mucous membranes are moist.  Eyes:     Conjunctiva/sclera: Conjunctivae normal.  Cardiovascular:     Rate and Rhythm: Normal rate and regular rhythm.     Heart sounds: Normal heart sounds.  Pulmonary:     Effort:  Pulmonary effort is normal.     Breath sounds: Normal breath sounds.  Skin:    General: Skin is warm and dry.     Capillary Refill: Capillary refill takes less than 2 seconds.  Neurological:     Mental Status: She is alert and oriented to person, place, and time.  Psychiatric:        Mood and Affect: Mood normal.        Behavior: Behavior normal.     (all labs ordered are listed, but only abnormal results are displayed) Labs Reviewed  BASIC METABOLIC PANEL WITH GFR - Abnormal; Notable for the following components:      Result Value   Glucose, Bld 115 (*)    All other components within normal limits  CBC  TROPONIN T, HIGH SENSITIVITY  TROPONIN T, HIGH SENSITIVITY    EKG: EKG Interpretation Date/Time:  Monday August 24 2024 19:58:41 EDT Ventricular Rate:  101 PR Interval:  150 QRS Duration:  79 QT Interval:  330 QTC Calculation: 428 R Axis:   60  Text Interpretation: Sinus tachycardia Baseline wander in lead(s) V5 Confirmed by Neysa Clap [phone]) on 08/24/2024 8:24:38 PM  Radiology: CT Angio Chest PE W and/or Wo Contrast Result Date: 08/24/2024 CLINICAL DATA:  Chest pain. History of lung cancer. Concern for pulmonary embolism. EXAM: CT ANGIOGRAPHY CHEST WITH CONTRAST TECHNIQUE: Multidetector CT imaging of the chest was performed using the standard protocol during bolus administration of intravenous contrast. Multiplanar CT image reconstructions and MIPs were obtained to evaluate the vascular anatomy. RADIATION DOSE REDUCTION: This exam was performed according to the departmental dose-optimization program which includes automated exposure control, adjustment of the mA and/or kV according to patient size and/or use of iterative reconstruction technique. CONTRAST:  75mL OMNIPAQUE  IOHEXOL  350 MG/ML SOLN COMPARISON:  Chest radiograph dated 08/24/2024 and CT dated 04/11/2021. FINDINGS: Cardiovascular: There is no cardiomegaly or pericardial effusion. Mild atherosclerotic  calcification of the thoracic aorta. No aneurysmal dilatation or dissection. Evaluation of the pulmonary arteries is limited due to respiratory motion. No pulmonary artery embolus identified. Mediastinum/Nodes: No obvious hilar or mediastinal adenopathy. Evaluation of the right hilum however is limited due to postsurgical changes and consolidation of the right middle lobe. The esophagus is grossly unremarkable. No mediastinal fluid collection. Lungs/Pleura: Post treatment changes with near complete consolidation and volume loss in the right middle lobe and right perihilar fibrosis. Several clusters of nodular density in the posterior right lower lobe (83/6) suspicious for developing infiltrate. Clinical correlation and follow-up recommended. No pleural effusion pneumothorax. The central airways are patent. Upper Abdomen: No acute abnormality. Musculoskeletal: Osteopenia with degenerative changes. No acute osseous pathology. Review of the MIP images confirms the above findings. IMPRESSION: 1. No CT evidence of pulmonary embolism. 2. Post treatment changes  with near complete consolidation and volume loss in the right middle lobe and right perihilar fibrosis. 3. Several clusters of nodular density in the posterior right lower lobe suspicious for developing infiltrate. 4.  Aortic Atherosclerosis (ICD10-I70.0). Electronically Signed   By: Vanetta Chou M.D.   On: 08/24/2024 21:21   DG Chest 2 View Result Date: 08/24/2024 CLINICAL DATA:  Lung cancer depression presenting with chest pain, hemoptysis, chronic shortness of breath. EXAM: CHEST - 2 VIEW COMPARISON:  PA Lat chest series 09/30/23, 03/05/23. FINDINGS: Heart size and vascular pattern are normal. There is a stable mediastinal configuration with right-sided volume loss and shift of the mediastinum to the right. Also stable is masslike right hilar fullness and surrounding architectural distortion consistent with posttreatment changes. There are adjacent linear  atelectasis or scarring changes also seen previously with tenting and elevation of right hemidiaphragm. No new opacity concerning for infiltrate is seen. The sulci are sharp. There are cholecystectomy clips. There is kyphosis and mild degenerative change of the thoracic spine with osteopenia. IMPRESSION: 1. No evidence of acute chest disease. 2. Stable posttreatment changes in the right lung with right hilar fullness and surrounding architectural distortion. 3. Stable right-sided volume loss and shift of the mediastinum to the right. Electronically Signed   By: Francis Quam M.D.   On: 08/24/2024 20:34     Procedures   Medications Ordered in the ED  iohexol  (OMNIPAQUE ) 350 MG/ML injection 75 mL (75 mLs Intravenous Contrast Given 08/24/24 2113)    Clinical Course as of 08/25/24 0009  Mon Aug 24, 2024  2126 CT Angio Chest PE W and/or Wo Contrast IMPRESSION: 1. No CT evidence of pulmonary embolism. 2. Post treatment changes with near complete consolidation and volume loss in the right middle lobe and right perihilar fibrosis. 3. Several clusters of nodular density in the posterior right lower lobe suspicious for developing infiltrate. 4.  Aortic Atherosclerosis (ICD10-I70.0).   Electronically Signed   By: Vanetta Chou M.D.   On: 08/24/2024 21:21   [TY]  2127 Per pulm note 9/26: Past Medical and Surgical History  NSCLC stage 3b -RUL and RML -chemoradiation in 2013 -seen in ipulm clinic in 07/2017 for possible bronchial recurrence --> bronch with RML stenosis and BI mucus plug -surveillance CTs changed to screening CT in spring 2019 PE on rivaroxaban  -diagnosed 12/2012  -had previously had DVT in setting of surgery Diastolic Dysfunction  Possible Zenker's Diverticulum on barium swallow 2019   [TY]  2143 Spoke with hospitalist for admission. [TY]    Clinical Course User Index [TY] Neysa Caron PARAS, DO                                 Medical Decision Making This is a  78 year old female with complex past medical history to include recurrent PEs, lung cancer, bronchiectasis, on Xarelto  presenting emergency department with hemoptysis.  She is afebrile nontachycardic, maintaining oxygen saturation on room air.  Does not appear in overt distress.  Continues to have a minor cough and some scant hemoptysis currently, but did show me a picture on her phone with a significantly saturated towel at home.  Concern for possible repeat PE versus infection.  CTA however was negative for PE or overt process.  Hemoglobin stable at 13.5.  No significant metabolic derangements.  Troponin negative.  EKG without ischemic changes.  Given patient's age and comorbidities with the amount of mops that she had prior  to arrival and being on Xarelto  feel that she would warrant a period of observation overnight.  Case discussed with hospitalist who agrees to admit patient.  Amount and/or Complexity of Data Reviewed Independent Historian:     Details: Husband notes on Xarelto . Labs: ordered. Radiology: ordered. Decision-making details documented in ED Course.  Risk Prescription drug management. Decision regarding hospitalization.      Final diagnoses:  Hemoptysis    ED Discharge Orders     None          Neysa Caron PARAS, DO 08/25/24 0009

## 2024-08-25 NOTE — Progress Notes (Signed)
   08/25/24 0841  TOC Brief Assessment  Insurance and Status Reviewed  Patient has primary care physician Yes  Home environment has been reviewed single family home  Prior level of function: independent  Prior/Current Home Services No current home services  Social Drivers of Health Review SDOH reviewed no interventions necessary  Readmission risk has been reviewed Yes  Transition of care needs transition of care needs identified, TOC will continue to follow    Signed: Heather Saltness, MSW, LCSW Clinical Social Worker Inpatient Care Management 08/25/2024 8:43 AM

## 2024-08-25 NOTE — H&P (Addendum)
 History and Physical    [name] FMW:[id number] DOB: [birth year] DOA: 08/24/2024  PCP: Johnny Garnette LABOR, MD  Patient coming from: [facility]  I have personally briefly reviewed patient's old medical records in [facility]  Chief Complaint: hemotpysis/Chest pain  HPI: [name] is a 78 y.o. female with medical history significant for chronic cough due to a combination of asthma and post radiation associated bronchiectasis ,PE on xarelto , CHFpefm HLD, HTN,who has interim history of being treated for exacerbation of asthma/bronchiectasis at the early part of this month at which time she was treatment with prednisone  and doxycycline .Patient was recently seen by pulmonary on 9/26 an was noted to still have mild symptoms and was prescribed 5 days of prednisone .  Patient now presents to Ed with hemoptysis and central chest pain that started one hour prior to presentation. Patient notes no associated fever/chills /n/v/d and note no further episodes since being admitted.  He notes no new sob from her baseline.   ED Course:   IN ED  Vitals afeb bp 167/85-123/101, hr 101, rr 24 sat 95% on ra  EKG: sinus tachycardia  Wbc 7.9, hg 13.5, lt 229 Na 141,K 3.7, cl 103, bicarb 24, glu 115, cr 0.94 CTA: IMPRESSION: 1. No CT evidence of pulmonary embolism. 2. Post treatment changes with near complete consolidation and volume loss in the right middle lobe and right perihilar fibrosis. 3. Several clusters of nodular density in the posterior right lower lobe suspicious for developing infiltrate.  Review of Systems: As per HPI otherwise 10 point review of systems negative.   Past Medical History:  Diagnosis Date   Anemia    Cancer (HCC)    Cataract    Clotting disorder    Community acquired pneumonia 01/22/2013   Depression with anxiety 04/18/2022   DVT (deep venous thrombosis) (HCC)    had 2 in right calf & 1 in the left calf   Dyspnea    nothing new, hx lung cancer   Fibromyalgia     Hyperlipidemia    Hypertension    no current problems no meds since cancer tx   Peripheral vascular disease    PMR (polymyalgia rheumatica) 10/31/2017   PONV (postoperative nausea and vomiting)    slow to wake up from anesthesia   Squamous cell lung cancer (HCC) 11/27/2011   sees Dr. Elenor Ruth at [facility] and Dr. Velinda Quan at [facility]    Tobacco abuse    quit smoking in 2013    Past Surgical History:  Procedure Laterality Date   APPENDECTOMY     CHOLECYSTECTOMY     COLONOSCOPY     11/28/09 repeat in 10 years   ESOPHAGOGASTRODUODENOSCOPY  October 2013   severe esophagitis, done at [facility]    KNEE ARTHROSCOPY     right   LAMINECTOMY     lumbar   PORTACATH PLACEMENT     RADIOLOGY WITH ANESTHESIA N/A 01/02/2022   Procedure: MRI LUMBAR WITH AND WITHOUT WITH ANESTHESIA;  Surgeon: Radiologist, Medication, MD;  Location: [facility] OR;  Service: Radiology;  Laterality: N/A;   TONSILLECTOMY     VIDEO BRONCHOSCOPY  05/30/2012   Procedure: VIDEO BRONCHOSCOPY WITH FLUORO;  Surgeon: Belvie FORBES Silvan, MD;  Location: [facility] ENDOSCOPY;  Service: Cardiopulmonary;  Laterality: N/A;     reports that she quit smoking about 12 years ago. Her smoking use included cigarettes. She has never used smokeless tobacco. She reports current alcohol use of about 4.0 standard drinks of alcohol  per week. She reports that she does not use drugs.  Allergies  Allergen Reactions   Ciprofloxacin  Other (See Comments)    Diffuse polyarthropathy, joint effusions, pain across UE and LE with Cipro  750 mg. Fluoroquinolone class avoidance recommended.    Promethazine Other (See Comments)    Patient states was during a hospitalization was taken off but unsure of the reason   Polymyxin B  Other (See Comments)    Eye irritation   Prednisone      Did NOT tolerate well   Trimethoprim  Other (See Comments)    Eye irritation    Morphine And Codeine Other (See Comments)    Hard time waking up when taking morphine, codeine,  and related medications    Family History  Problem Relation Age of Onset   Hypertension Mother        family history   Atrial fibrillation Mother    Cancer Mother        Lung   Cancer Father        NHL   Multiple sclerosis Sister    Colon cancer Paternal Aunt    Anemia Other        FE deficiency    Arthritis Other        family history   Cancer Other        colon 1st degree relative <60   Colon polyps Neg Hx    Esophageal cancer Neg Hx    Stomach cancer Neg Hx    Rectal cancer Neg Hx     Prior to Admission medications   Medication Sig Start Date End Date Taking? Authorizing Provider  acetaminophen  (TYLENOL ) 500 MG tablet Take 1,000 mg by mouth every 6 (six) hours as needed for mild pain.    [provider]  albuterol  (PROVENTIL ) (2.5 MG/3ML) 0.083% nebulizer solution Take 3 mLs (2.5 mg total) by nebulization every 4 (four) hours as needed for wheezing or shortness of breath. 03/20/21   Johnny Garnette LABOR, MD  albuterol  (PROVENTIL ) (2.5 MG/3ML) 0.083% nebulizer solution Take 3 mLs (2.5 mg total) by nebulization every 4 (four) hours as needed for wheezing or shortness of breath. 07/25/23   Johnny Garnette LABOR, MD  albuterol  (VENTOLIN  HFA) 108 (90 Base) MCG/ACT inhaler INHALE 2 PUFFS BY MOUTH EVERY 4 HOURS AS NEEDED FOR WHEEZING OR SHORTNESS OF BREATH 12/24/22   Johnny Garnette LABOR, MD  AREXVY 120 MCG/0.5ML injection  10/31/22   [provider]  benzonatate  (TESSALON ) 100 MG capsule Take 1 capsule (100 mg total) by mouth in the morning, at noon, in the evening, and at bedtime. 12/11/23   Johnny Garnette LABOR, MD  budesonide -formoterol  (SYMBICORT ) 160-4.5 MCG/ACT inhaler Inhale 2 puffs into the lungs 2 (two) times daily. 09/12/23   Johnny Garnette LABOR, MD  COMIRNATY syringe  11/07/22   [provider]  cyanocobalamin  (,VITAMIN B-12,) 1000 MCG/ML injection Inject 1,000 mcg into the muscle every 14 (fourteen) days.    [provider]  doxycycline  (VIBRA -TABS) 100 MG tablet Take 1  tablet (100 mg total) by mouth 2 (two) times daily. 07/28/24   Johnny Garnette LABOR, MD  enoxaparin  (LOVENOX ) 60 MG/0.6ML injection Inject 0.6 mLs (60 mg total) into the skin every 12 (twelve) hours. 04/18/22   Johnny Garnette LABOR, MD  erythromycin  ophthalmic ointment Apply 1/2 inch strip to the lower eyelid of both eyes four times a day for 7 days 10/02/23   Mercer Clotilda SAUNDERS, MD  ondansetron  (ZOFRAN ) 8 MG tablet Take 1 tablet (8 mg total)  by mouth every 6 (six) hours as needed for nausea or vomiting. 04/27/22   Johnny Garnette LABOR, MD  rivaroxaban  (XARELTO ) 20 MG TABS tablet Take 1 tablet (20 mg total) by mouth daily with supper. 05/11/24   Johnny Garnette LABOR, MD  sertraline  (ZOLOFT ) 50 MG tablet Take 1 tablet (50 mg total) by mouth daily. 04/18/22   Johnny Garnette LABOR, MD  sodium chloride  HYPERTONIC 3 % nebulizer solution Take by nebulization as needed for other. 07/24/23   Johnny Garnette LABOR, MD  tobramycin -dexamethasone  (TOBRADEX ) ophthalmic solution Place 2 drops into both eyes every 4 (four) hours while awake. 03/31/24   Johnny Garnette LABOR, MD    Physical Exam: Vitals:   08/25/24 0000 08/25/24 0100 08/25/24 0145 08/25/24 0231  BP: 120/73 115/77 121/73 (!) 150/84  Pulse: 81 73 73 82  Resp: 18  18 16   Temp: 98.2 F (36.8 C)   (!) 97.5 F (36.4 C)  TempSrc: Oral   Oral  SpO2: 92% 91% 93% 98%  Weight:      Height:        Constitutional: NAD, calm, comfortable Vitals:   08/25/24 0000 08/25/24 0100 08/25/24 0145 08/25/24 0231  BP: 120/73 115/77 121/73 (!) 150/84  Pulse: 81 73 73 82  Resp: 18  18 16   Temp: 98.2 F (36.8 C)   (!) 97.5 F (36.4 C)  TempSrc: Oral   Oral  SpO2: 92% 91% 93% 98%  Weight:      Height:       Eyes: PERRL, lids and conjunctivae normal ENMT: Mucous membranes are moist. Posterior pharynx clear of any exudate or lesions.Normal dentition.  Neck: normal, supple, no masses, no thyromegaly Respiratory: clear to auscultation bilaterally, no wheezing, no crackles. Occasional course bs, diminish on left,  Normal respiratory effort. No accessory muscle use.  Cardiovascular: Regular rate and rhythm, no murmurs / rubs / gallops. No extremity edema. 2+ pedal pulses. Abdomen: no tenderness, no masses palpated. No hepatosplenomegaly. Bowel sounds positive.  Musculoskeletal: no clubbing / cyanosis. No joint deformity upper and lower extremities. Good ROM, no contractures. Normal muscle tone.  Skin: no rashes, lesions, ulcers. No induration Neurologic: CN 2-12 grossly intact. Sensation intact,l. Strength 5/5 in all 4.  Psychiatric: Normal judgment and insight. Alert and oriented x 3. Normal mood.    Labs on Admission: I have personally reviewed following labs and imaging studies  CBC: Recent Labs  Lab 08/24/24 2005  WBC 7.9  HGB 13.5  HCT 39.7  MCV 89.4  PLT 229   Basic Metabolic Panel: Recent Labs  Lab 08/24/24 2005  NA 141  K 3.7  CL 103  CO2 24  GLUCOSE 115*  BUN 20  CREATININE 0.94  CALCIUM 10.2   GFR: Estimated Creatinine Clearance: 43.7 mL/min (by C-G formula based on SCr of 0.94 mg/dL). Liver Function Tests: No results for input(s): AST, ALT, ALKPHOS, BILITOT, PROT, ALBUMIN in the last 168 hours. No results for input(s): LIPASE, AMYLASE in the last 168 hours. No results for input(s): AMMONIA in the last 168 hours. Coagulation Profile: No results for input(s): INR, PROTIME in the last 168 hours. Cardiac Enzymes: No results for input(s): CKTOTAL, CKMB, CKMBINDEX, TROPONINI in the last 168 hours. BNP (last 3 results) No results for input(s): PROBNP in the last 8760 hours. HbA1C: No results for input(s): HGBA1C in the last 72 hours. CBG: No results for input(s): GLUCAP in the last 168 hours. Lipid Profile: No results for input(s): CHOL, HDL, LDLCALC, TRIG, CHOLHDL, LDLDIRECT in the  last 72 hours. Thyroid  Function Tests: No results for input(s): TSH, T4TOTAL, FREET4, T3FREE, THYROIDAB in the last 72 hours. Anemia  Panel: No results for input(s): VITAMINB12, FOLATE, FERRITIN, TIBC, IRON, RETICCTPCT in the last 72 hours. Urine analysis:    Component Value Date/Time   COLORURINE ORANGE (A) 06/25/2022 1500   APPEARANCEUR TURBID (A) 06/25/2022 1500   LABSPEC 1.020 06/25/2022 1500   PHURINE < OR = 5.0 06/25/2022 1500   GLUCOSEU NEGATIVE 06/25/2022 1500   HGBUR 1+ (A) 06/25/2022 1500   HGBUR 1+ 12/26/2009 1059   BILIRUBINUR neg 03/20/2021 1411   KETONESUR NEGATIVE 06/25/2022 1500   PROTEINUR 1+ (A) 06/25/2022 1500   UROBILINOGEN 0.2 03/20/2021 1411   UROBILINOGEN 0.2 01/22/2013 1100   NITRITE POSITIVE (A) 06/25/2022 1500   LEUKOCYTESUR 3+ (A) 06/25/2022 1500    Radiological Exams on Admission: CT Angio Chest PE W and/or Wo Contrast Result Date: 08/24/2024 CLINICAL DATA:  Chest pain. History of lung cancer. Concern for pulmonary embolism. EXAM: CT ANGIOGRAPHY CHEST WITH CONTRAST TECHNIQUE: Multidetector CT imaging of the chest was performed using the standard protocol during bolus administration of intravenous contrast. Multiplanar CT image reconstructions and MIPs were obtained to evaluate the vascular anatomy. RADIATION DOSE REDUCTION: This exam was performed according to the departmental dose-optimization program which includes automated exposure control, adjustment of the mA and/or kV according to patient size and/or use of iterative reconstruction technique. CONTRAST:  75mL OMNIPAQUE  IOHEXOL  350 MG/ML SOLN COMPARISON:  Chest radiograph dated 08/24/2024 and CT dated 04/11/2021. FINDINGS: Cardiovascular: There is no cardiomegaly or pericardial effusion. Mild atherosclerotic calcification of the thoracic aorta. No aneurysmal dilatation or dissection. Evaluation of the pulmonary arteries is limited due to respiratory motion. No pulmonary artery embolus identified. Mediastinum/Nodes: No obvious hilar or mediastinal adenopathy. Evaluation of the right hilum however is limited due to postsurgical  changes and consolidation of the right middle lobe. The esophagus is grossly unremarkable. No mediastinal fluid collection. Lungs/Pleura: Post treatment changes with near complete consolidation and volume loss in the right middle lobe and right perihilar fibrosis. Several clusters of nodular density in the posterior right lower lobe (83/6) suspicious for developing infiltrate. Clinical correlation and follow-up recommended. No pleural effusion pneumothorax. The central airways are patent. Upper Abdomen: No acute abnormality. Musculoskeletal: Osteopenia with degenerative changes. No acute osseous pathology. Review of the MIP images confirms the above findings. IMPRESSION: 1. No CT evidence of pulmonary embolism. 2. Post treatment changes with near complete consolidation and volume loss in the right middle lobe and right perihilar fibrosis. 3. Several clusters of nodular density in the posterior right lower lobe suspicious for developing infiltrate. 4.  Aortic Atherosclerosis (ICD10-I70.0). Electronically Signed   By: Vanetta Chou M.D.   On: 08/24/2024 21:21   DG Chest 2 View Result Date: 08/24/2024 CLINICAL DATA:  Lung cancer depression presenting with chest pain, hemoptysis, chronic shortness of breath. EXAM: CHEST - 2 VIEW COMPARISON:  PA Lat chest series 09/30/23, 03/05/23. FINDINGS: Heart size and vascular pattern are normal. There is a stable mediastinal configuration with right-sided volume loss and shift of the mediastinum to the right. Also stable is masslike right hilar fullness and surrounding architectural distortion consistent with posttreatment changes. There are adjacent linear atelectasis or scarring changes also seen previously with tenting and elevation of right hemidiaphragm. No new opacity concerning for infiltrate is seen. The sulci are sharp. There are cholecystectomy clips. There is kyphosis and mild degenerative change of the thoracic spine with osteopenia. IMPRESSION: 1. No evidence of  acute chest disease. 2. Stable posttreatment changes in the right lung with right hilar fullness and surrounding architectural distortion. 3. Stable right-sided volume loss and shift of the mediastinum to the right. Electronically Signed   By: Francis Quam M.D.   On: 08/24/2024 20:34    EKG: Independently reviewed.   Assessment/Plan Hemoptysis  -in setting of post radiation bronchiectasis  -patient with history of recent exacerbation  s/p doxycycline  x 2 round recently  -on last day of five day course of prednisone  -CT scan with ? Of developing infiltrate -patient w/o fever or white count  - will hold antibiotics for now  --hold xarelto  overnight , -monitor h/h  -please consult pulmonary in am  for further assistance   Asthma  -no acute exacerbation  -no wheezing hold further dosing of prednisone    PE  -on xarelto  -CTPE - no PE - due to hemoptysis holding xarelto  overnight    CHFpef - no acute exacerbation   HLD -resume home regimen once med rec completed    HTN -stable resume home regimen as able   DVT prophylaxis: scd Code Status:full/ as discussed per patient wishes in event of cardiac arrest  Family Communication: none at bedside Disposition Plan: patient  expected to be admitted greater than 2 midnights  Consults called: Pulmonary epic message sent Admission status: progressive care    Camila DELENA Ned MD [facility]   If 7PM-7AM, please contact night-coverage [email or web address] Password [email or web address]  08/25/2024, 4:06 AM

## 2024-08-26 DIAGNOSIS — J471 Bronchiectasis with (acute) exacerbation: Secondary | ICD-10-CM | POA: Diagnosis not present

## 2024-08-26 DIAGNOSIS — R042 Hemoptysis: Secondary | ICD-10-CM | POA: Diagnosis not present

## 2024-08-26 LAB — BASIC METABOLIC PANEL WITH GFR
Anion gap: 12 (ref 5–15)
BUN: 17 mg/dL (ref 8–23)
CO2: 23 mmol/L (ref 22–32)
Calcium: 9.8 mg/dL (ref 8.9–10.3)
Chloride: 104 mmol/L (ref 98–111)
Creatinine, Ser: 0.85 mg/dL (ref 0.44–1.00)
GFR, Estimated: >60 mL/min (ref >60)
Glucose, Bld: 104 mg/dL — ABNORMAL HIGH (ref 70–99)
Potassium: 3.8 mmol/L (ref 3.5–5.1)
Sodium: 140 mmol/L (ref 135–145)

## 2024-08-26 LAB — CBC
HCT: 43.3 % (ref 36.0–46.0)
Hemoglobin: 14.1 g/dL (ref 12.0–15.0)
MCH: 29.3 pg (ref 26.0–34.0)
MCHC: 32.6 g/dL (ref 30.0–36.0)
MCV: 90 fL (ref 80.0–100.0)
Platelets: 220 K/uL (ref 150–400)
RBC: 4.81 MIL/uL (ref 3.87–5.11)
RDW: 13 % (ref 11.5–15.5)
WBC: 5.5 K/uL (ref 4.0–10.5)
nRBC: 0 % (ref 0.0–0.2)

## 2024-08-26 MED ORDER — METHYLPREDNISOLONE SODIUM SUCC 40 MG IJ SOLR
40.0000 mg | Freq: Every day | INTRAMUSCULAR | Status: DC
  Administered 2024-08-26 – 2024-08-27 (×2): 40 mg via INTRAVENOUS
  Filled 2024-08-26 (×2): qty 1

## 2024-08-26 MED ORDER — HYDROCOD POLI-CHLORPHE POLI ER 10-8 MG/5ML PO SUER
2.5000 mL | Freq: Two times a day (BID) | ORAL | Status: AC
  Administered 2024-08-26 – 2024-08-27 (×3): 2.5 mL via ORAL
  Filled 2024-08-26 (×3): qty 115

## 2024-08-26 MED ORDER — POTASSIUM CHLORIDE CRYS ER 20 MEQ PO TBCR
20.0000 meq | EXTENDED_RELEASE_TABLET | Freq: Once | ORAL | Status: AC
  Administered 2024-08-26: 20 meq via ORAL
  Filled 2024-08-26: qty 1

## 2024-08-26 MED ORDER — SODIUM CHLORIDE 3 % IN NEBU
4.0000 mL | INHALATION_SOLUTION | Freq: Two times a day (BID) | RESPIRATORY_TRACT | Status: DC
Start: 2024-08-26 — End: 2024-08-28
  Administered 2024-08-26 – 2024-08-28 (×5): 4 mL via RESPIRATORY_TRACT
  Filled 2024-08-26 (×5): qty 4

## 2024-08-26 NOTE — Plan of Care (Signed)
  Problem: Activity: Goal: Risk for activity intolerance will decrease Outcome: Progressing   Problem: Coping: Goal: Level of anxiety will decrease Outcome: Progressing   Problem: Elimination: Goal: Will not experience complications related to bowel motility Outcome: Progressing   Problem: Pain Managment: Goal: General experience of comfort will improve and/or be controlled Outcome: Progressing   Problem: Safety: Goal: Ability to remain free from injury will improve Outcome: Progressing   Problem: Skin Integrity: Goal: Risk for impaired skin integrity will decrease Outcome: Progressing

## 2024-08-26 NOTE — Progress Notes (Signed)
 [facility] PROGRESS NOTE    Progress Note  [name]  FMW:[id number] DOB: 07-01-1946 DOA: 08/24/2024 PCP: Johnny Garnette LABOR, MD     Brief Narrative:   [name] is an 78 y.o. female past medical history significant for chronic cough probably combination of asthma and post radiation associated bronchiectasis, history of lung cancer non-small cell stage IIIb, tobacco abuse, history of DVT/PE on Xarelto , HFpEF, essential hypertension, she was recently treated for an asthma/bronchiectasis exacerbation with prednisone  and doxycycline  was reevaluated by pulmonary where she started had mild persistent symptoms so she was given additional steroid treatment, now presents to the ED with hemoptysis central chest pain.  Assessment/Plan:   Hemoptysis in the setting of a previous history of lung cancer status post chemoradiation With a history of radiation bronchiectasis.  She recently has been treated with antibiotics and steroids.  CT angio of the chest was negative for PE but it does show a near complete consolidation of the right middle lobe with perihilar fibrosis also several clusters of nodular density in the posterior right lower lobe suspicious for developing infiltrate. Pulmonology was consulted.  Xarelto  is on hold.   Patient is on ceftriaxone  with plans to give it for 5 days. Bronchoscopy is being considered if patient continues to have hemoptysis. Seems to have improved as of this morning.  Hemoglobin is stable.  Metabolic panel is pending. EKG does not show any acute findings.  Troponin levels were normal.  Chest pain is most likely due to pulmonary issue.  Persistent asthma: Not in exacerbation continue inhalers.  History of DVT and PE: CT angio of the chest negative for PE. Due to her hemoptysis will continue to hold Xarelto .  HFpEF: Not in exacerbation continue current home meds.  Hyperlipidemia: Not on a statin at home will ask pharmacy to to review med  rec.   DVT prophylaxis: SCD Family Communication:none CODE STATUS: Full code Disposition: Hopefully return home when improved.  IV Access:   Peripheral IV   Procedures and diagnostic studies:   CT Angio Chest PE W and/or Wo Contrast Result Date: 08/24/2024 CLINICAL DATA:  Chest pain. History of lung cancer. Concern for pulmonary embolism. EXAM: CT ANGIOGRAPHY CHEST WITH CONTRAST TECHNIQUE: Multidetector CT imaging of the chest was performed using the standard protocol during bolus administration of intravenous contrast. Multiplanar CT image reconstructions and MIPs were obtained to evaluate the vascular anatomy. RADIATION DOSE REDUCTION: This exam was performed according to the departmental dose-optimization program which includes automated exposure control, adjustment of the mA and/or kV according to patient size and/or use of iterative reconstruction technique. CONTRAST:  75mL OMNIPAQUE  IOHEXOL  350 MG/ML SOLN COMPARISON:  Chest radiograph dated 08/24/2024 and CT dated 04/11/2021. FINDINGS: Cardiovascular: There is no cardiomegaly or pericardial effusion. Mild atherosclerotic calcification of the thoracic aorta. No aneurysmal dilatation or dissection. Evaluation of the pulmonary arteries is limited due to respiratory motion. No pulmonary artery embolus identified. Mediastinum/Nodes: No obvious hilar or mediastinal adenopathy. Evaluation of the right hilum however is limited due to postsurgical changes and consolidation of the right middle lobe. The esophagus is grossly unremarkable. No mediastinal fluid collection. Lungs/Pleura: Post treatment changes with near complete consolidation and volume loss in the right middle lobe and right perihilar fibrosis. Several clusters of nodular density in the posterior right lower lobe (83/6) suspicious for developing infiltrate. Clinical correlation and follow-up recommended. No pleural effusion pneumothorax. The central airways are patent. Upper Abdomen: No  acute abnormality. Musculoskeletal: Osteopenia with degenerative changes. No acute osseous  pathology. Review of the MIP images confirms the above findings. IMPRESSION: 1. No CT evidence of pulmonary embolism. 2. Post treatment changes with near complete consolidation and volume loss in the right middle lobe and right perihilar fibrosis. 3. Several clusters of nodular density in the posterior right lower lobe suspicious for developing infiltrate. 4.  Aortic Atherosclerosis (ICD10-I70.0). Electronically Signed   By: Vanetta Chou M.D.   On: 08/24/2024 21:21   DG Chest 2 View Result Date: 08/24/2024 CLINICAL DATA:  Lung cancer depression presenting with chest pain, hemoptysis, chronic shortness of breath. EXAM: CHEST - 2 VIEW COMPARISON:  PA Lat chest series 09/30/23, 03/05/23. FINDINGS: Heart size and vascular pattern are normal. There is a stable mediastinal configuration with right-sided volume loss and shift of the mediastinum to the right. Also stable is masslike right hilar fullness and surrounding architectural distortion consistent with posttreatment changes. There are adjacent linear atelectasis or scarring changes also seen previously with tenting and elevation of right hemidiaphragm. No new opacity concerning for infiltrate is seen. The sulci are sharp. There are cholecystectomy clips. There is kyphosis and mild degenerative change of the thoracic spine with osteopenia. IMPRESSION: 1. No evidence of acute chest disease. 2. Stable posttreatment changes in the right lung with right hilar fullness and surrounding architectural distortion. 3. Stable right-sided volume loss and shift of the mediastinum to the right. Electronically Signed   By: Francis Quam M.D.   On: 08/24/2024 20:34     Medical Consultants:   Pulmonology   Subjective:   Patient still coughing up some blood but appears to be old blood.  More brownish in color.  Has not had large amount of hemoptysis like she did when she came into  the hospital.  Occasional right sided chest pain.  Objective:    Vitals:   08/25/24 1417 08/25/24 2038 08/26/24 0342 08/26/24 0836  BP: 139/76 131/76 112/67   Pulse: 81 88 75   Resp: 16 18 17    Temp: 98 F (36.7 C) 97.6 F (36.4 C) 97.7 F (36.5 C)   TempSrc:  Oral Oral   SpO2: 97% 96% 96% 98%  Weight:      Height:       SpO2: 98 %   Intake/Output Summary (Last 24 hours) at 08/26/2024 1038 Last data filed at 08/26/2024 0900 Gross per 24 hour  Intake 360 ml  Output --  Net 360 ml   Filed Weights   08/24/24 1955  Weight: 62.2 kg    Exam:  General appearance: Awake alert.  In no distress Resp: Diminished air entry at the right lung with few crackles.  No wheezing or rhonchi. Cardio: S1-S2 is normal regular.  No S3-S4.  No rubs murmurs or bruit GI: Abdomen is soft.  Nontender nondistended.  Bowel sounds are present normal.  No masses organomegaly Extremities: No edema.  Full range of motion of lower extremities. Neurologic: Alert and oriented x3.  No focal neurological deficits.     Data Reviewed:    Labs: Basic Metabolic Panel: Recent Labs  Lab 08/24/24 2005 08/25/24 0617  NA 141 141  K 3.7 4.2  CL 103 106  CO2 24 22  GLUCOSE 115* 96  BUN 20 15  CREATININE 0.94 0.70  CALCIUM 10.2 10.2  MG  --  2.4  PHOS  --  3.1   GFR Estimated Creatinine Clearance: 51.3 mL/min (by C-G formula based on SCr of 0.7 mg/dL).  Liver Function Tests: Recent Labs  Lab 08/25/24 0617  AST  21  ALT 12  ALKPHOS 55  BILITOT 0.4  PROT 7.2  ALBUMIN 4.4    Lab Results  Component Value Date   SARSCOV2NAA NEGATIVE 02/04/2022   SARSCOV2NAA Not Detected 07/07/2019    CBC: Recent Labs  Lab 08/24/24 2005 08/26/24 1007  WBC 7.9 5.5  HGB 13.5 14.1  HCT 39.7 43.3  MCV 89.4 90.0  PLT 229 220    BNP (last 3 results) Recent Labs    08/25/24 0617  PROBNP 139.0    Hgb A1c: Recent Labs    08/25/24 0617  HGBA1C 4.7*    Thyroid  function studies: Recent Labs     08/25/24 0617  TSH 0.828   Sepsis Labs: Recent Labs  Lab 08/24/24 2005 08/26/24 1007  WBC 7.9 5.5   Microbiology Recent Results (from the past 240 hours)  Expectorated Sputum Assessment w Gram Stain, Rflx to Resp Cult     Status: None   Collection Time: 08/25/24  3:15 PM   Specimen: Expectorated Sputum  Result Value Ref Range Status   Specimen Description EXPECTORATED SPUTUM  Final   Special Requests NONE  Final   Sputum evaluation   Final    THIS SPECIMEN IS ACCEPTABLE FOR SPUTUM CULTURE Performed at [facility], [address], [place], [place] [address]    Report Status 08/25/2024 FINAL  Final  Culture, Respiratory w Gram Stain     Status: None (Preliminary result)   Collection Time: 08/25/24  3:15 PM  Result Value Ref Range Status   Specimen Description   Final    EXPECTORATED SPUTUM Performed at [facility], [address] [place], [place] [address]    Special Requests   Final    NONE Reflexed from [id number] Performed at [facility], [address] [place], [place] [address]    Gram Stain   Final    RARE WBC SEEN FEW GRAM NEGATIVE RODS RARE GRAM POSITIVE COCCI Performed at [facility], [address], [place], [place] [address]    Culture PENDING  Incomplete   Report Status PENDING  Incomplete     Medications:    dextromethorphan  30 mg Oral TID   fluticasone  furoate-vilanterol  1 puff Inhalation Daily   Continuous Infusions:  cefTRIAXone  (ROCEPHIN )  IV 1 g (08/26/24 0902)      LOS: 1 day   Jhoana Upham  [facility]  08/26/2024, 10:38 AM

## 2024-08-26 NOTE — Plan of Care (Signed)
  Problem: Education: Goal: Knowledge of General Education information will improve Description: Including pain rating scale, medication(s)/side effects and non-pharmacologic comfort measures Outcome: Progressing   Problem: Health Behavior/Discharge Planning: Goal: Ability to manage health-related needs will improve Outcome: Progressing   Problem: Clinical Measurements: Goal: Ability to maintain clinical measurements within normal limits will improve Outcome: Progressing Goal: Will remain free from infection 08/26/2024 0556 by Rosalva Reena HERO, RN Outcome: Progressing 08/26/2024 0556 by Rosalva Reena HERO, RN Outcome: Progressing Goal: Diagnostic test results will improve 08/26/2024 0556 by Rosalva Reena HERO, RN Outcome: Progressing 08/26/2024 0556 by Rosalva Reena HERO, RN Outcome: Progressing Goal: Respiratory complications will improve Outcome: Progressing Goal: Cardiovascular complication will be avoided 08/26/2024 0556 by Rosalva Reena HERO, RN Outcome: Progressing 08/26/2024 0556 by Rosalva Reena HERO, RN Outcome: Progressing   Problem: Activity: Goal: Risk for activity intolerance will decrease Outcome: Progressing   Problem: Coping: Goal: Level of anxiety will decrease Outcome: Progressing   Problem: Elimination: Goal: Will not experience complications related to bowel motility Outcome: Progressing Goal: Will not experience complications related to urinary retention Outcome: Progressing   Problem: Pain Managment: Goal: General experience of comfort will improve and/or be controlled 08/26/2024 0556 by Rosalva Reena HERO, RN Outcome: Progressing 08/26/2024 0556 by Rosalva Reena HERO, RN Outcome: Progressing   Problem: Safety: Goal: Ability to remain free from injury will improve 08/26/2024 0557 by Rosalva Reena HERO, RN Outcome: Progressing 08/26/2024 0556 by Rosalva Reena HERO, RN Outcome: Progressing 08/26/2024 0556 by Rosalva Reena HERO, RN Outcome: Progressing   Problem: Skin Integrity: Goal: Risk  for impaired skin integrity will decrease 08/26/2024 0557 by Rosalva Reena HERO, RN Outcome: Progressing 08/26/2024 0556 by Rosalva Reena HERO, RN Outcome: Progressing

## 2024-08-26 NOTE — Progress Notes (Signed)
 NAME:  [name], MRN:  [id number], DOB:  01-Jul-1946, LOS: 1 ADMISSION DATE:  08/24/2024, CONSULTATION DATE:  08/26/2024  REFERRING MD:  Erle READ CHIEF COMPLAINT: Hemoptysis  History of Present Illness:  78 year old woman who follows with [facility] pulmonology for chronic cough due to combination of asthma and postradiation associated bronchiectasis.  Prior imaging has showed changes of fibrosis around the right hilum with persistent right middle lobe collapse/consolidation. [facility] notes were reviewed, she apparently has recurrent symptoms every 3 to 4 months She recently underwent meniscus repair and postop had mucus production was treated with doxycycline  for 10 days.  At her last visit 9/26, airway clearance was emphasized, she was given a short course of oral prednisone . She presented with hemoptysis, reports 5 to 7 cc of frank blood, shows me a picture on her phone CT chest showed previously noted changes of radiation with right perihilar fibrosis extending in the right middle lobe with clusters of nodular density in the right lower lobe   Per [facility] notes, Chest CT, 11/28/23 and 08/07/23 -significant stable R perihilar radiation fibrosis with persistent RML collapse -some tree in bud in the surrounding RLL -mild bi apical emphysema   Last bronchoscopy 9/18  03/20/16: AFB negative, BAL from RUL  Pertinent  Medical History  NSCLC stage 3b -RUL and RML -chemoradiation in 2013 -seen in ipulm clinic in 07/2017 for possible bronchial recurrence --> bronch with RML stenosis and BI mucus plug -surveillance CTs changed to screening CT in spring 2019 PE on rivaroxaban  -diagnosed 12/2012  -had previously had DVT in setting of surgery Diastolic Dysfunction  Possible Zenker's Diverticulum on barium swallow 2019   Significant Hospital Events: Including procedures, antibiotic start and stop dates in addition to other pertinent events     Interim History / Subjective:  Hemoptysis has  decreased Shows remains small tinge of black sputum Coughing persists  Objective    Blood pressure 112/67, pulse 75, temperature 97.7 F (36.5 C), temperature source Oral, resp. rate 17, height 5' 5 (1.651 m), weight 62.2 kg, SpO2 98%.        Intake/Output Summary (Last 24 hours) at 08/26/2024 1054 Last data filed at 08/26/2024 0900 Gross per 24 hour  Intake 360 ml  Output --  Net 360 ml   Filed Weights   08/24/24 1955  Weight: 62.2 kg    Examination: Gen. Pleasant, well-nourished, in no distress, normal affect ENT - no pallor,icterus, no oral source of bleeding Neck: No JVD, no thyromegaly, no carotid bruits Lungs: no use of accessory muscles, faint scattered rhonchi, no crackles Cardiovascular: S1 S2 regular Abdomen: soft and non-tender, no hepatosplenomegaly, BS normal. Musculoskeletal: No deformities, no cyanosis or clubbing, no edema Neuro:  alert, non focal   Labs show no leukocytosis, normal electrolytes, normal BNP  Resolved problem list   Assessment and Plan   Hemoptysis -likely related to bronchiectatic exacerbation , no evidence of recurrent malignancy on imaging.Changes of fibrosis perihilar and right middle lobe appear chronic.  There are nodular densities in the right lower lobe which could represent aspirated blood or infection - She was treated with doxycycline  and 5 days of prednisone   -Hold Xarelto  for at least 3 to 5 days - ceftriaxone  for 5 days - Using cough suppressant such as dextromethorphan 30 mg 3 times daily , but this has not been effective.  She is sensitive to codeine but will try half dose Tussionex every 12 hours and see if we can achieve cough suppression to avoid further irritating airways  -  Solu-Medrol  40 daily for bronchospasm -Will reinstate saline nebs but hold off and flutter valve - Bronchoscopy does not seem necessary at this time to localize , based on imaging this seems to be likely coming from the area of bronchiectasis on the  right   Patient, husband and daughter updated at bedside  Labs   CBC: Recent Labs  Lab 08/24/24 2005 08/26/24 1007  WBC 7.9 5.5  HGB 13.5 14.1  HCT 39.7 43.3  MCV 89.4 90.0  PLT 229 220    Basic Metabolic Panel: Recent Labs  Lab 08/24/24 2005 08/25/24 0617 08/26/24 1007  NA 141 141 140  K 3.7 4.2 3.8  CL 103 106 104  CO2 24 22 23   GLUCOSE 115* 96 104*  BUN 20 15 17   CREATININE 0.94 0.70 0.85  CALCIUM 10.2 10.2 9.8  MG  --  2.4  --   PHOS  --  3.1  --    GFR: Estimated Creatinine Clearance: 48.3 mL/min (by C-G formula based on SCr of 0.85 mg/dL). Recent Labs  Lab 08/24/24 2005 08/26/24 1007  WBC 7.9 5.5    Liver Function Tests: Recent Labs  Lab 08/25/24 0617  AST 21  ALT 12  ALKPHOS 55  BILITOT 0.4  PROT 7.2  ALBUMIN 4.4   No results for input(s): LIPASE, AMYLASE in the last 168 hours. No results for input(s): AMMONIA in the last 168 hours.  ABG    Component Value Date/Time   TCO2 33 08/03/2008 1535     Coagulation Profile: No results for input(s): INR, PROTIME in the last 168 hours.  Cardiac Enzymes: No results for input(s): CKTOTAL, CKMB, CKMBINDEX, TROPONINI in the last 168 hours.  HbA1C: Hgb A1c MFr Bld  Date/Time Value Ref Range Status  08/25/2024 06:17 AM 4.7 (L) 4.8 - 5.6 % Final    Comment:    (NOTE) Diagnosis of Diabetes The following HbA1c ranges recommended by the American Diabetes Association (ADA) may be used as an aid in the diagnosis of diabetes mellitus.  Hemoglobin             Suggested A1C NGSP%              Diagnosis  <5.7                   Non Diabetic  5.7-6.4                Pre-Diabetic  >6.4                   Diabetic  <7.0                   Glycemic control for                       adults with diabetes.    05/13/2024 02:36 PM 5.2 4.6 - 6.5 % Final    Comment:    Glycemic Control Guidelines for People with Diabetes:Non Diabetic:  <6%Goal of Therapy: <7%Additional Action Suggested:   >8%     CBG: No results for input(s): GLUCAP in the last 168 hours.  Harden Staff MD. DEBE. [place] Pager : [phone]  If no response to pager , please call [phone] until 7 pm After 7:00 pm call Elink  [phone]   08/26/2024

## 2024-08-27 DIAGNOSIS — J471 Bronchiectasis with (acute) exacerbation: Secondary | ICD-10-CM | POA: Diagnosis not present

## 2024-08-27 DIAGNOSIS — R042 Hemoptysis: Secondary | ICD-10-CM | POA: Diagnosis not present

## 2024-08-27 LAB — CBC
HCT: 40.1 % (ref 36.0–46.0)
Hemoglobin: 13.3 g/dL (ref 12.0–15.0)
MCH: 30 pg (ref 26.0–34.0)
MCHC: 33.2 g/dL (ref 30.0–36.0)
MCV: 90.5 fL (ref 80.0–100.0)
Platelets: 213 K/uL (ref 150–400)
RBC: 4.43 MIL/uL (ref 3.87–5.11)
RDW: 13 % (ref 11.5–15.5)
WBC: 5.6 K/uL (ref 4.0–10.5)
nRBC: 0 % (ref 0.0–0.2)

## 2024-08-27 LAB — BASIC METABOLIC PANEL WITH GFR
Anion gap: 11 (ref 5–15)
BUN: 17 mg/dL (ref 8–23)
CO2: 23 mmol/L (ref 22–32)
Calcium: 9.7 mg/dL (ref 8.9–10.3)
Chloride: 108 mmol/L (ref 98–111)
Creatinine, Ser: 0.75 mg/dL (ref 0.44–1.00)
GFR, Estimated: >60 mL/min (ref >60)
Glucose, Bld: 91 mg/dL (ref 70–99)
Potassium: 3.9 mmol/L (ref 3.5–5.1)
Sodium: 141 mmol/L (ref 135–145)

## 2024-08-27 MED ORDER — RIVAROXABAN 20 MG PO TABS
20.0000 mg | ORAL_TABLET | Freq: Every day | ORAL | Status: DC
  Administered 2024-08-27: 20 mg via ORAL
  Filled 2024-08-27: qty 1

## 2024-08-27 NOTE — Progress Notes (Signed)
 NAME:  [name], MRN:  [id number], DOB:  23-Jun-1946, LOS: 2 ADMISSION DATE:  08/24/2024, CONSULTATION DATE:  08/27/2024  REFERRING MD:  Erle READ CHIEF COMPLAINT: Hemoptysis  History of Present Illness:  78 year old woman who follows with [facility] pulmonology for chronic cough due to combination of asthma and postradiation associated bronchiectasis.  Prior imaging has showed changes of fibrosis around the right hilum with persistent right middle lobe collapse/consolidation. [facility] notes were reviewed, she apparently has recurrent symptoms every 3 to 4 months She recently underwent meniscus repair and postop had mucus production was treated with doxycycline  for 10 days.  At her last visit 9/26, airway clearance was emphasized, she was given a short course of oral prednisone . She presented with hemoptysis, reports 5 to 7 cc of frank blood, shows me a picture on her phone CT chest showed previously noted changes of radiation with right perihilar fibrosis extending in the right middle lobe with clusters of nodular density in the right lower lobe   Per [facility] notes, Chest CT, 11/28/23 and 08/07/23 -significant stable R perihilar radiation fibrosis with persistent RML collapse -some tree in bud in the surrounding RLL -mild bi apical emphysema   Last bronchoscopy 9/18  03/20/16: AFB negative, BAL from RUL  Pertinent  Medical History  NSCLC stage 3b -RUL and RML -chemoradiation in 2013 -seen in ipulm clinic in 07/2017 for possible bronchial recurrence --> bronch with RML stenosis and BI mucus plug -surveillance CTs changed to screening CT in spring 2019 PE on rivaroxaban  -diagnosed 12/2012  -had previously had DVT in setting of surgery Diastolic Dysfunction  Possible Zenker's Diverticulum on barium swallow 2019   Significant Hospital Events: Including procedures, antibiotic start and stop dates in addition to other pertinent events     Interim History / Subjective:   Hemoptysis  subsided Coughing much improved with addition of Tussionex cough syrup yesterday  Objective    Blood pressure 113/67, pulse 74, temperature 98.7 F (37.1 C), resp. rate 14, height 5' 5 (1.651 m), weight 62.2 kg, SpO2 96%.        Intake/Output Summary (Last 24 hours) at 08/27/2024 1122 Last data filed at 08/26/2024 1600 Gross per 24 hour  Intake 200 ml  Output --  Net 200 ml   Filed Weights   08/24/24 1955  Weight: 62.2 kg    Examination: Gen. Pleasant, well-nourished, in no distress, normal affect ENT - no pallor,icterus, no oral source of bleeding Neck: No JVD, no thyromegaly, no carotid bruits Lungs: no use of accessory muscles, no rhonchi, no crackles Cardiovascular: S1 S2 regular, no murmur Abdomen: soft and non-tender, no hepatosplenomegaly, BS normal. Musculoskeletal: No deformities, no cyanosis or clubbing, no edema Neuro:  alert, non focal   Labs show no leukocytosis, normal electrolytes, normal BNP  Resolved problem list   Assessment and Plan   Hemoptysis -likely related to bronchiectatic exacerbation , no evidence of recurrent malignancy on imaging.Changes of fibrosis perihilar and right middle lobe appear chronic.  There are nodular densities in the right lower lobe which could represent aspirated blood or infection - She was treated with doxycycline  and 5 days of prednisone   -Can challenge with Xarelto  again now that hemoptysis subsided - ceftriaxone  IV now, can complete course of Augmentin  for 5 days - dextromethorphan 30 mg 3 times daily was not effective.  She is sensitive to codeine but tolerated half dose Tussionex every 12 hours well, can continue for 5 days see if we can achieve cough suppression to avoid further irritating  airways  - Can switch from Solu-Medrol  to oral prednisone  and complete 5-day course -Will reinstate saline nebs but hold off on flutter valve - Bronchoscopy does not seem necessary at this time to localize , based on imaging this  seems to be likely coming from the area of bronchiectasis on the right   Patient, daughter updated at bedside   Labs   CBC: Recent Labs  Lab 08/24/24 2005 08/26/24 1007 08/27/24 0528  WBC 7.9 5.5 5.6  HGB 13.5 14.1 13.3  HCT 39.7 43.3 40.1  MCV 89.4 90.0 90.5  PLT 229 220 213    Basic Metabolic Panel: Recent Labs  Lab 08/24/24 2005 08/25/24 0617 08/26/24 1007 08/27/24 0528  NA 141 141 140 141  K 3.7 4.2 3.8 3.9  CL 103 106 104 108  CO2 24 22 23 23   GLUCOSE 115* 96 104* 91  BUN 20 15 17 17   CREATININE 0.94 0.70 0.85 0.75  CALCIUM 10.2 10.2 9.8 9.7  MG  --  2.4  --   --   PHOS  --  3.1  --   --    GFR: Estimated Creatinine Clearance: 51.3 mL/min (by C-G formula based on SCr of 0.75 mg/dL). Recent Labs  Lab 08/24/24 2005 08/26/24 1007 08/27/24 0528  WBC 7.9 5.5 5.6    Liver Function Tests: Recent Labs  Lab 08/25/24 0617  AST 21  ALT 12  ALKPHOS 55  BILITOT 0.4  PROT 7.2  ALBUMIN 4.4   No results for input(s): LIPASE, AMYLASE in the last 168 hours. No results for input(s): AMMONIA in the last 168 hours.  ABG    Component Value Date/Time   TCO2 33 08/03/2008 1535     Coagulation Profile: No results for input(s): INR, PROTIME in the last 168 hours.  Cardiac Enzymes: No results for input(s): CKTOTAL, CKMB, CKMBINDEX, TROPONINI in the last 168 hours.  HbA1C: Hgb A1c MFr Bld  Date/Time Value Ref Range Status  08/25/2024 06:17 AM 4.7 (L) 4.8 - 5.6 % Final    Comment:    (NOTE) Diagnosis of Diabetes The following HbA1c ranges recommended by the American Diabetes Association (ADA) may be used as an aid in the diagnosis of diabetes mellitus.  Hemoglobin             Suggested A1C NGSP%              Diagnosis  <5.7                   Non Diabetic  5.7-6.4                Pre-Diabetic  >6.4                   Diabetic  <7.0                   Glycemic control for                       adults with diabetes.    05/13/2024  02:36 PM 5.2 4.6 - 6.5 % Final    Comment:    Glycemic Control Guidelines for People with Diabetes:Non Diabetic:  <6%Goal of Therapy: <7%Additional Action Suggested:  >8%     CBG: No results for input(s): GLUCAP in the last 168 hours.  Harden Staff MD. DEBE. [facility] [facility] Pager : [phone]  If no response to pager , please call 319 [phone] until 7  pm After 7:00 pm call Elink  [phone]   08/27/2024

## 2024-08-27 NOTE — Progress Notes (Addendum)
 PROGRESS NOTE    [name]  FMW:[id number] DOB: [birth year] DOA: 08/24/2024 PCP: [name] LABOR, MD   Brief Narrative:    78 y.o. female past medical history significant for chronic cough probably combination of asthma and post radiation associated bronchiectasis, history of lung cancer non-small cell stage IIIb, tobacco abuse, history of DVT/PE on Xarelto , HFpEF, essential hypertension, she was recently treated for an asthma/bronchiectasis exacerbation with prednisone  and doxycycline  was reevaluated by pulmonary where she started had mild persistent symptoms so she was given additional steroid treatment, now presents to the ED with hemoptysis central chest pain. Symptoms improving and xarelto  restarted on 10/2. Pulmonology is on board.   Assessment & Plan:  Principal Problem:   Hemoptysis Active Problems:   Essential hypertension   Bronchiectasis (HCC)    Hemoptysis in the setting of a previous history of lung cancer status post chemoradiation,POA: Symptoms are resolving now. In the setting of history of radiation bronchiectasis.   CT angio of the chest was negative for PE but it does show a near complete consolidation of the right middle lobe with perihilar fibrosis also several clusters of nodular density in the posterior right lower lobe suspicious for developing infiltrate. Pulmonology consulted.  Xarelto  restarted on 10/2  Patient is on ceftriaxone  with plans to give it for 5 days. No indications for inpt bronch at this time. Hemoglobin is stable.  Persistent asthma: Not in exacerbation, continue inhalers.   History of DVT and PE: CT angio of the chest negative for PE. Restarted Xarelto  on 10/2.   HFpEF:NYHA Class II Not in exacerbation, continue current home meds.  H/o NSCLC stage 3b -RUL and RML -chemoradiation in 2013    Disposition: Lives at home with her husband.  DVT prophylaxis: SCDs Start: 08/25/24 0440     Code Status: Full Code Family  Communication:  Husband at the bedside Status is: Inpatient Remains inpatient appropriate because: Hemoptysis    Subjective:  She denied any episodes of hemoptysis this morning. She finished treatment for her stage 3A lung cancer in 2013. She had multiple right lung Pulmonary embolisms and right LE DVTs. She was initially on warfarin, which was later switched to Xarelto  and she has been doing okay with that.    Examination:  General exam: Appears calm and comfortable  Respiratory system: Clear to auscultation. Respiratory effort normal. Cardiovascular system: S1 & S2 heard, RRR. No JVD, murmurs, rubs, gallops or clicks. No pedal edema. Gastrointestinal system: Abdomen is nondistended, soft and nontender. No organomegaly or masses felt. Normal bowel sounds heard. Central nervous system: Alert and oriented. No focal neurological deficits. Extremities: Symmetric 5 x 5 power. Skin: No rashes, lesions or ulcers Psychiatry: Judgement and insight appear normal. Mood & affect appropriate.     Diet Orders (From admission, onward)     Start     Ordered   08/25/24 1240  Diet regular Room service appropriate? Yes; Fluid consistency: Thin  Diet effective now       Question Answer Comment  Room service appropriate? Yes   Fluid consistency: Thin      08/25/24 1239            Objective: Vitals:   08/26/24 1208 08/26/24 1830 08/26/24 2054 08/27/24 0519  BP:  (!) 159/87 108/70 113/67  Pulse:  89 94 74  Resp:  18 12 14   Temp:   97.7 F (36.5 C) 98.7 F (37.1 C)  TempSrc:      SpO2: 93% 95% 96% 96%  Weight:  Height:        Intake/Output Summary (Last 24 hours) at 08/27/2024 0913 Last data filed at 08/26/2024 1600 Gross per 24 hour  Intake 200 ml  Output --  Net 200 ml   Filed Weights   08/24/24 1955  Weight: 62.2 kg    Scheduled Meds:  dextromethorphan  30 mg Oral TID   fluticasone  furoate-vilanterol  1 puff Inhalation Daily   methylPREDNISolone  (SOLU-MEDROL )  injection  40 mg Intravenous Daily   sodium chloride  HYPERTONIC  4 mL Nebulization BID   Continuous Infusions:  cefTRIAXone  (ROCEPHIN )  IV 1 g (08/27/24 0851)    Nutritional status     Body mass index is 22.82 kg/m.  Data Reviewed:   CBC: Recent Labs  Lab 08/24/24 2005 08/26/24 1007 08/27/24 0528  WBC 7.9 5.5 5.6  HGB 13.5 14.1 13.3  HCT 39.7 43.3 40.1  MCV 89.4 90.0 90.5  PLT 229 220 213   Basic Metabolic Panel: Recent Labs  Lab 08/24/24 2005 08/25/24 0617 08/26/24 1007 08/27/24 0528  NA 141 141 140 141  K 3.7 4.2 3.8 3.9  CL 103 106 104 108  CO2 24 22 23 23   GLUCOSE 115* 96 104* 91  BUN 20 15 17 17   CREATININE 0.94 0.70 0.85 0.75  CALCIUM 10.2 10.2 9.8 9.7  MG  --  2.4  --   --   PHOS  --  3.1  --   --    GFR: Estimated Creatinine Clearance: 51.3 mL/min (by C-G formula based on SCr of 0.75 mg/dL). Liver Function Tests: Recent Labs  Lab 08/25/24 0617  AST 21  ALT 12  ALKPHOS 55  BILITOT 0.4  PROT 7.2  ALBUMIN 4.4   No results for input(s): LIPASE, AMYLASE in the last 168 hours. No results for input(s): AMMONIA in the last 168 hours. Coagulation Profile: No results for input(s): INR, PROTIME in the last 168 hours. Cardiac Enzymes: No results for input(s): CKTOTAL, CKMB, CKMBINDEX, TROPONINI in the last 168 hours. BNP (last 3 results) Recent Labs    08/25/24 0617  PROBNP 139.0   HbA1C: Recent Labs    08/25/24 0617  HGBA1C 4.7*   CBG: No results for input(s): GLUCAP in the last 168 hours. Lipid Profile: No results for input(s): CHOL, HDL, LDLCALC, TRIG, CHOLHDL, LDLDIRECT in the last 72 hours. Thyroid  Function Tests: Recent Labs    08/25/24 0617  TSH 0.828   Anemia Panel: No results for input(s): VITAMINB12, FOLATE, FERRITIN, TIBC, IRON, RETICCTPCT in the last 72 hours. Sepsis Labs: No results for input(s): PROCALCITON, LATICACIDVEN in the last 168 hours.  Recent Results (from the  past 240 hours)  Expectorated Sputum Assessment w Gram Stain, Rflx to Resp Cult     Status: None   Collection Time: 08/25/24  3:15 PM   Specimen: Expectorated Sputum  Result Value Ref Range Status   Specimen Description EXPECTORATED SPUTUM  Final   Special Requests NONE  Final   Sputum evaluation   Final    THIS SPECIMEN IS ACCEPTABLE FOR SPUTUM CULTURE Performed at [facility], [address] [place], [place] [address]    Report Status 08/25/2024 FINAL  Final  Culture, Respiratory w Gram Stain     Status: None (Preliminary result)   Collection Time: 08/25/24  3:15 PM  Result Value Ref Range Status   Specimen Description   Final    EXPECTORATED SPUTUM Performed at [facility], [address] [place], [place] [address]  Special Requests   Final    NONE Reflexed from [id number] Performed at [facility], [address] [place], [place] [address]    Gram Stain   Final    RARE WBC SEEN FEW GRAM NEGATIVE RODS RARE GRAM POSITIVE COCCI    Culture   Final    TOO YOUNG TO READ Performed at [facility], [address], [place], [place] [address]    Report Status PENDING  Incomplete         Radiology Studies: No results found.         LOS: 2 days   Time spent= 41 mins    Deliliah Room, MD [facility]  If 7PM-7AM, please contact night-coverage  08/27/2024, 9:13 AM

## 2024-08-27 NOTE — Plan of Care (Signed)
  Problem: Education: Goal: Knowledge of General Education information will improve Description: Including pain rating scale, medication(s)/side effects and non-pharmacologic comfort measures Outcome: Progressing   Problem: Health Behavior/Discharge Planning: Goal: Ability to manage health-related needs will improve Outcome: Progressing   Problem: Clinical Measurements: Goal: Ability to maintain clinical measurements within normal limits will improve Outcome: Progressing Goal: Diagnostic test results will improve Outcome: Progressing Goal: Respiratory complications will improve Outcome: Progressing Goal: Cardiovascular complication will be avoided Outcome: Progressing   Problem: Activity: Goal: Risk for activity intolerance will decrease Outcome: Progressing   Problem: Coping: Goal: Level of anxiety will decrease Outcome: Progressing   Problem: Elimination: Goal: Will not experience complications related to bowel motility Outcome: Progressing Goal: Will not experience complications related to urinary retention Outcome: Progressing   Problem: Pain Managment: Goal: General experience of comfort will improve and/or be controlled Outcome: Progressing   Problem: Safety: Goal: Ability to remain free from injury will improve Outcome: Progressing   Problem: Skin Integrity: Goal: Risk for impaired skin integrity will decrease Outcome: Progressing   Problem: Clinical Measurements: Goal: Will remain free from infection Outcome: Not Progressing   Problem: Nutrition: Goal: Adequate nutrition will be maintained Outcome: Not Progressing

## 2024-08-27 NOTE — Plan of Care (Signed)
 ?  Problem: Clinical Measurements: ?Goal: Will remain free from infection ?Outcome: Progressing ?  ?

## 2024-08-28 DIAGNOSIS — R042 Hemoptysis: Secondary | ICD-10-CM | POA: Diagnosis not present

## 2024-08-28 LAB — CULTURE, RESPIRATORY W GRAM STAIN

## 2024-08-28 MED ORDER — PREDNISONE 20 MG PO TABS: 20.0000 mg | ORAL_TABLET | Freq: Every day | ORAL | 0 refills | Status: DC

## 2024-08-28 MED ORDER — HYDROCOD POLI-CHLORPHE POLI ER 10-8 MG/5ML PO SUER: 2.5000 mL | Freq: Two times a day (BID) | ORAL | 0 refills | Status: AC | PRN

## 2024-08-28 MED ORDER — AMOXICILLIN-POT CLAVULANATE 875-125 MG PO TABS: 1.0000 | ORAL_TABLET | Freq: Two times a day (BID) | ORAL | 0 refills | Status: DC

## 2024-08-28 NOTE — Plan of Care (Signed)

## 2024-08-28 NOTE — Discharge Summary (Signed)
 Physician Discharge Summary   Patient: [name] MRN: [id number] DOB: 05-17-1946  Admit date:     08/24/2024  Discharge date: 08/28/24  Discharge Physician: Deliliah Room   PCP: Johnny Garnette LABOR, MD   Recommendations at discharge:    F/u with your PCP in one week. F/u with your pulmonologist. Please call to make an appointment. Continue taking meds as prescribed Stop Xarelto  if you develop bleeding from any site.   Discharge Diagnoses: Principal Problem:   Hemoptysis Active Problems:   Essential hypertension   Bronchiectasis [facility])   Hospital Course:  78 y.o. female past medical history significant for chronic cough probably combination of asthma and post radiation associated bronchiectasis, history of lung cancer non-small cell stage IIIb, tobacco abuse, history of DVT/PE on Xarelto , HFpEF, essential hypertension, she was recently treated for an asthma/bronchiectasis exacerbation with prednisone  and doxycycline  was reevaluated by pulmonary where she started had mild persistent symptoms so she was given additional steroid treatment, presented to the ED with hemoptysis central chest pain. Symptoms improving and xarelto  restarted on 10/2. No episodes of hemoptysis or any other bleeding. Discussed with pulmonologist, Dr Harden on the discharge day and patient was discharged on a short course of steroids and augmentin  with a plan to f/u with her own pulmonologist and Oncologist. She was also given a prescription for Tussionex to be taken 2.5 ml BIDPRN cough.  She was strongly advised to stop Xarelto  if she develops further episodes of hemoptysis or GI bleed.       Consultants: Pulmonology Procedures performed: None  Disposition: Home Diet recommendation:  Regular diet DISCHARGE MEDICATION: Allergies as of 08/28/2024       Reactions   Ciprofloxacin  Other (See Comments)   Diffuse polyarthropathy, joint effusions, pain across UE and LE with Cipro  750 mg. Fluoroquinolone class  avoidance recommended.    Promethazine Other (See Comments)   Patient states was during a hospitalization was taken off but unsure of the reason   Polymyxin B  Other (See Comments)   Eye irritation   Prednisone     Did NOT tolerate well   Trimethoprim  Other (See Comments)   Eye irritation    Morphine And Codeine Other (See Comments)   Hard time waking up when taking morphine, codeine, and related medications        Medication List     STOP taking these medications    doxycycline  100 MG tablet Commonly known as: VIBRA -TABS       TAKE these medications    acetaminophen  500 MG tablet Commonly known as: TYLENOL  Take 1,000 mg by mouth daily as needed for mild pain (pain score 1-3) or moderate pain (pain score 4-6).   albuterol  (2.5 MG/3ML) 0.083% nebulizer solution Commonly known as: PROVENTIL  Take 3 mLs (2.5 mg total) by nebulization every 4 (four) hours as needed for wheezing or shortness of breath. What changed: when to take this   albuterol  108 (90 Base) MCG/ACT inhaler Commonly known as: VENTOLIN  HFA INHALE 2 PUFFS BY MOUTH EVERY 4 HOURS AS NEEDED FOR WHEEZING OR SHORTNESS OF BREATH What changed: Another medication with the same name was changed. Make sure you understand how and when to take each.   amoxicillin -clavulanate 875-125 MG tablet Commonly known as: AUGMENTIN  Take 1 tablet by mouth 2 (two) times daily.   benzonatate  100 MG capsule Commonly known as: TESSALON  Take 1 capsule (100 mg total) by mouth in the morning, at noon, in the evening, and at bedtime. What changed:  when to take this  reasons to take this   budesonide -formoterol  160-4.5 MCG/ACT inhaler Commonly known as: Symbicort  Inhale 2 puffs into the lungs 2 (two) times daily.   chlorpheniramine-HYDROcodone  10-8 MG/5ML Commonly known as: TUSSIONEX Take 2.5 mLs by mouth every 12 (twelve) hours as needed for up to 5 days for cough.   cyanocobalamin  1000 MCG/ML injection Commonly known as:  VITAMIN B12 Inject 1,000 mcg into the muscle every 14 (fourteen) days.   predniSONE  20 MG tablet Commonly known as: DELTASONE  Take 1 tablet (20 mg total) by mouth daily with breakfast for 5 days. What changed: when to take this   rivaroxaban  20 MG Tabs tablet Commonly known as: Xarelto  Take 1 tablet (20 mg total) by mouth daily with supper.   sodium chloride  HYPERTONIC 3 % nebulizer solution Take by nebulization as needed for other. What changed:  how much to take when to take this        Follow-up Information     Johnny Garnette LABOR, MD. Schedule an appointment as soon as possible for a visit in 1 week(s).   Specialty: Family Medicine Contact information: [address] [place] [address] [phone]                Discharge Exam: Filed Weights   08/24/24 1955  Weight: 62.2 kg   Constitutional: NAD, calm, comfortable Eyes: PERRL, lids and conjunctivae normal ENMT: Mucous membranes are moist. Posterior pharynx clear of any exudate or lesions.Normal dentition.  Neck: normal, supple, no masses, no thyromegaly Respiratory: clear to auscultation bilaterally, no wheezing, no crackles. Normal respiratory effort. No accessory muscle use.  Cardiovascular: Regular rate and rhythm, no murmurs / rubs / gallops. No extremity edema. 2+ pedal pulses. No carotid bruits.  Abdomen: no tenderness, no masses palpated. No hepatosplenomegaly. Bowel sounds positive.  Musculoskeletal: no clubbing / cyanosis. No joint deformity upper and lower extremities. Good ROM, no contractures. Normal muscle tone.  Skin: no rashes, lesions, ulcers. No induration Neurologic: CN 2-12 grossly intact. Sensation intact, DTR normal. Strength 5/5 x all 4 extremities.  Psychiatric: Normal judgment and insight. Alert and oriented x 3. Normal mood.    Condition at discharge: good  The results of significant diagnostics from this hospitalization (including imaging, microbiology, ancillary and  laboratory) are listed below for reference.   Imaging Studies: CT Angio Chest PE W and/or Wo Contrast Result Date: 08/24/2024 CLINICAL DATA:  Chest pain. History of lung cancer. Concern for pulmonary embolism. EXAM: CT ANGIOGRAPHY CHEST WITH CONTRAST TECHNIQUE: Multidetector CT imaging of the chest was performed using the standard protocol during bolus administration of intravenous contrast. Multiplanar CT image reconstructions and MIPs were obtained to evaluate the vascular anatomy. RADIATION DOSE REDUCTION: This exam was performed according to the departmental dose-optimization program which includes automated exposure control, adjustment of the mA and/or kV according to patient size and/or use of iterative reconstruction technique. CONTRAST:  75mL OMNIPAQUE  IOHEXOL  350 MG/ML SOLN COMPARISON:  Chest radiograph dated 08/24/2024 and CT dated 04/11/2021. FINDINGS: Cardiovascular: There is no cardiomegaly or pericardial effusion. Mild atherosclerotic calcification of the thoracic aorta. No aneurysmal dilatation or dissection. Evaluation of the pulmonary arteries is limited due to respiratory motion. No pulmonary artery embolus identified. Mediastinum/Nodes: No obvious hilar or mediastinal adenopathy. Evaluation of the right hilum however is limited due to postsurgical changes and consolidation of the right middle lobe. The esophagus is grossly unremarkable. No mediastinal fluid collection. Lungs/Pleura: Post treatment changes with near complete consolidation and volume loss in the right middle lobe and right perihilar  fibrosis. Several clusters of nodular density in the posterior right lower lobe (83/6) suspicious for developing infiltrate. Clinical correlation and follow-up recommended. No pleural effusion pneumothorax. The central airways are patent. Upper Abdomen: No acute abnormality. Musculoskeletal: Osteopenia with degenerative changes. No acute osseous pathology. Review of the MIP images confirms the above  findings. IMPRESSION: 1. No CT evidence of pulmonary embolism. 2. Post treatment changes with near complete consolidation and volume loss in the right middle lobe and right perihilar fibrosis. 3. Several clusters of nodular density in the posterior right lower lobe suspicious for developing infiltrate. 4.  Aortic Atherosclerosis (ICD10-I70.0). Electronically Signed   By: Vanetta Chou M.D.   On: 08/24/2024 21:21   DG Chest 2 View Result Date: 08/24/2024 CLINICAL DATA:  Lung cancer depression presenting with chest pain, hemoptysis, chronic shortness of breath. EXAM: CHEST - 2 VIEW COMPARISON:  PA Lat chest series 09/30/23, 03/05/23. FINDINGS: Heart size and vascular pattern are normal. There is a stable mediastinal configuration with right-sided volume loss and shift of the mediastinum to the right. Also stable is masslike right hilar fullness and surrounding architectural distortion consistent with posttreatment changes. There are adjacent linear atelectasis or scarring changes also seen previously with tenting and elevation of right hemidiaphragm. No new opacity concerning for infiltrate is seen. The sulci are sharp. There are cholecystectomy clips. There is kyphosis and mild degenerative change of the thoracic spine with osteopenia. IMPRESSION: 1. No evidence of acute chest disease. 2. Stable posttreatment changes in the right lung with right hilar fullness and surrounding architectural distortion. 3. Stable right-sided volume loss and shift of the mediastinum to the right. Electronically Signed   By: Francis Quam M.D.   On: 08/24/2024 20:34   VAS US  LOWER EXTREMITY VENOUS (DVT) Result Date: 08/02/2024  Lower Venous DVT Study Patient Name:  ERISHA PAUGH  Date of Exam:   07/31/2024 Medical Rec #: [id number]           Accession #:    [id number] Date of Birth: 07-Jul-1946           Patient Gender: F Patient Age:   78 years Exam Location:  [address] Procedure:      VAS US  LOWER EXTREMITY VENOUS (DVT)  Referring Phys: CON MECCARIELLO --------------------------------------------------------------------------------  Indications: Pain.  Performing Technologist: Duwaine Hives RVS  Examination Guidelines: A complete evaluation includes B-mode imaging, spectral Doppler, color Doppler, and power Doppler as needed of all accessible portions of each vessel. Bilateral testing is considered an integral part of a complete examination. Limited examinations for reoccurring indications may be performed as noted. The reflux portion of the exam is performed with the patient in reverse Trendelenburg.  +-----+---------------+---------+-----------+----------+--------------+ RIGHTCompressibilityPhasicitySpontaneityPropertiesThrombus Aging +-----+---------------+---------+-----------+----------+--------------+ CFV  Full           Yes      Yes                                 +-----+---------------+---------+-----------+----------+--------------+   +---------+---------------+---------+-----------+----------+--------------+ LEFT     CompressibilityPhasicitySpontaneityPropertiesThrombus Aging +---------+---------------+---------+-----------+----------+--------------+ CFV      Full           Yes      Yes                                 +---------+---------------+---------+-----------+----------+--------------+ SFJ      Full                                                        +---------+---------------+---------+-----------+----------+--------------+  FV Prox  Full                                                        +---------+---------------+---------+-----------+----------+--------------+ FV Mid   Full                                                        +---------+---------------+---------+-----------+----------+--------------+ FV DistalFull                                                        +---------+---------------+---------+-----------+----------+--------------+  PFV      Full                                                        +---------+---------------+---------+-----------+----------+--------------+ POP      Full           Yes      Yes                                 +---------+---------------+---------+-----------+----------+--------------+ PTV      Full                                                        +---------+---------------+---------+-----------+----------+--------------+ PERO     Full                                                        +---------+---------------+---------+-----------+----------+--------------+     Summary: RIGHT: - No evidence of common femoral vein obstruction.   LEFT: - There is no evidence of deep vein thrombosis in the lower extremity.  - No cystic structure found in the popliteal fossa.  *See table(s) above for measurements and observations. Electronically signed by Gaile New MD on 08/02/2024 at 8:21:33 PM.    Final     Microbiology: Results for orders placed or performed during the hospital encounter of 08/24/24  Expectorated Sputum Assessment w Gram Stain, Rflx to Resp Cult     Status: None   Collection Time: 08/25/24  3:15 PM   Specimen: Expectorated Sputum  Result Value Ref Range Status   Specimen Description EXPECTORATED SPUTUM  Final   Special Requests NONE  Final   Sputum evaluation   Final    THIS SPECIMEN IS ACCEPTABLE FOR SPUTUM CULTURE Performed at [facility], [address], [place], [place] [address]    Report Status 08/25/2024 FINAL  Final  Culture, Respiratory w Gram  Stain     Status: None (Preliminary result)   Collection Time: 08/25/24  3:15 PM  Result Value Ref Range Status   Specimen Description   Final    EXPECTORATED SPUTUM Performed at [facility], [address] [place], [place] [address]    Special Requests   Final    NONE Reflexed from [id number] Performed at [facility], [address],  [place], [place] [address]    Gram Stain   Final    RARE WBC SEEN FEW GRAM NEGATIVE RODS RARE GRAM POSITIVE COCCI    Culture   Final    ABUNDANT PSEUDOMONAS AERUGINOSA SUSCEPTIBILITIES TO FOLLOW Performed at [facility], [address], [place], [place] [address]    Report Status PENDING  Incomplete    Labs: CBC: Recent Labs  Lab 08/24/24 2005 08/26/24 1007 08/27/24 0528  WBC 7.9 5.5 5.6  HGB 13.5 14.1 13.3  HCT 39.7 43.3 40.1  MCV 89.4 90.0 90.5  PLT 229 220 213   Basic Metabolic Panel: Recent Labs  Lab 08/24/24 2005 08/25/24 0617 08/26/24 1007 08/27/24 0528  NA 141 141 140 141  K 3.7 4.2 3.8 3.9  CL 103 106 104 108  CO2 24 22 23 23   GLUCOSE 115* 96 104* 91  BUN 20 15 17 17   CREATININE 0.94 0.70 0.85 0.75  CALCIUM 10.2 10.2 9.8 9.7  MG  --  2.4  --   --   PHOS  --  3.1  --   --    Liver Function Tests: Recent Labs  Lab 08/25/24 0617  AST 21  ALT 12  ALKPHOS 55  BILITOT 0.4  PROT 7.2  ALBUMIN 4.4   CBG: No results for input(s): GLUCAP in the last 168 hours.  Discharge time spent: 45 minutes.  Signed: Deliliah Room, MD [facility] 08/28/2024

## 2024-08-31 ENCOUNTER — Telehealth: Payer: Self-pay

## 2024-08-31 NOTE — Transitions of Care (Post Inpatient/ED Visit) (Signed)
   08/31/2024  Name: ARTHUR AYDELOTTE MRN: [id number] DOB: 01-23-1946  Today's TOC FU Call Status: Today's TOC FU Call Status:: Successful TOC FU Call Completed TOC FU Call Complete Date: 08/31/24 Patient's Name and Date of Birth confirmed.  Transition Care Management Follow-up Telephone Call Date of Discharge: 08/28/24 Discharge Facility: [facility]) Type of Discharge: Inpatient Admission Primary Inpatient Discharge Diagnosis:: Hemoptysis How have you been since you were released from the hospital?: Better Any questions or concerns?: No  Items Reviewed: Did you receive and understand the discharge instructions provided?: Yes Medications obtained,verified, and reconciled?:  (Reports that she has all her medications and knows how to take them.) Any new allergies since your discharge?: No  Medications Reviewed Today: declined review. States that she has all her medications and she knows how to take them Medications Reviewed Today   Medications were not reviewed in this encounter         Follow up appointments reviewed: PCP Follow-up appointment confirmed?: Yes Date of PCP follow-up appointment?: 09/02/24 Follow-up Provider: PCP [facility] Follow-up appointment confirmed?: No (has called and is waiting for a call back.)  Placed call to patient and reviewed reason for call. Patient reports that she is doing well. Reports that she has all her medications ( declined medication review). Reviewed that she as already reviewed her medications and scheduled her follow up. Offered to complete TOC call and patient declined.   Provided my contact information if patient changes her mind.  Alan Ee, RN, BSN, CEN [employer]- Transition of Care Team.  [facility] [phone]

## 2024-09-02 ENCOUNTER — Ambulatory Visit (INDEPENDENT_AMBULATORY_CARE_PROVIDER_SITE_OTHER): Admitting: Family Medicine

## 2024-09-02 ENCOUNTER — Encounter: Payer: Self-pay | Admitting: Family Medicine

## 2024-09-02 VITALS — BP 110/70 | HR 89 | Temp 97.5°F | Wt 136.4 lb

## 2024-09-02 DIAGNOSIS — J189 Pneumonia, unspecified organism: Secondary | ICD-10-CM

## 2024-09-02 DIAGNOSIS — E538 Deficiency of other specified B group vitamins: Secondary | ICD-10-CM | POA: Diagnosis not present

## 2024-09-02 MED ORDER — CYANOCOBALAMIN 1000 MCG/ML IJ SOLN
1000.0000 ug | Freq: Once | INTRAMUSCULAR | Status: AC
  Administered 2024-09-02: 1000 ug via INTRAMUSCULAR

## 2024-09-02 NOTE — Addendum Note (Signed)
 Addended by: LADONNA INOCENTE SAILOR on: 09/02/2024 02:45 PM   Modules accepted: Orders

## 2024-09-02 NOTE — Progress Notes (Signed)
   Subjective:    Patient ID: [name], female    DOB: January 31, 1946, 78 y.o.   MRN: [id number]  HPI Here to follow up on a hospital stay from 08-24-24 to 08-28-24 for hemoptysis. The day of admission she had a cough that felt deeper and harder than normal to her, and then she negan to see bright red blood coming up with very little sputum. After this went on awhile she went to the ED. Her exam was at baseline. EKG was normal. Labs were significant for Hgb 13.5, WBC 7.9, and creatinine 0.94. A CT angiogram of the chest was negative for PE, but some nodularity was seen in the RLL that could represent an early pneumonia. She was treated with IV antibiotics and steroids. The hemoptysis promptly resolved. She was sent home with more Prednisone  and 7 days of Augmentin . Today she feels fairly well except for a mild discomfort in the lower right chest. Her cough has let up a bit, and there has been no more blood seen. She has no more SOB than usual.    Review of Systems  Constitutional: Negative.   Respiratory:  Positive for cough and shortness of breath. Negative for wheezing.   Cardiovascular:  Positive for chest pain. Negative for palpitations and leg swelling.       Objective:   Physical Exam Constitutional:      Appearance: Normal appearance. She is not ill-appearing.  Cardiovascular:     Rate and Rhythm: Normal rate and regular rhythm.     Pulses: Normal pulses.     Heart sounds: Normal heart sounds.  Pulmonary:     Effort: Pulmonary effort is normal.     Breath sounds: No wheezing, rhonchi or rales.  Neurological:     Mental Status: She is alert.           Assessment & Plan:  She is recovering from a RLL pneumonia that was complicated by hemoptysis. This was no doubt related to the Xarelto . I do not she needs any more treatment at this point, but I asked her to come back next week for a recheck. I personally spent a total of 35 minutes in the care of the patient today  including getting/reviewing separately obtained history, performing a medically appropriate exam/evaluation, documenting clinical information in the EHR, independently interpreting results, and communicating results.  Garnette Olmsted, MD

## 2024-09-16 ENCOUNTER — Ambulatory Visit

## 2024-09-16 ENCOUNTER — Encounter: Payer: Self-pay | Admitting: Family Medicine

## 2024-09-16 ENCOUNTER — Ambulatory Visit (INDEPENDENT_AMBULATORY_CARE_PROVIDER_SITE_OTHER): Admitting: Family Medicine

## 2024-09-16 VITALS — BP 110/70 | HR 82 | Temp 97.7°F | Wt 137.8 lb

## 2024-09-16 DIAGNOSIS — Z86711 Personal history of pulmonary embolism: Secondary | ICD-10-CM

## 2024-09-16 DIAGNOSIS — J189 Pneumonia, unspecified organism: Secondary | ICD-10-CM

## 2024-09-16 DIAGNOSIS — E538 Deficiency of other specified B group vitamins: Secondary | ICD-10-CM | POA: Diagnosis not present

## 2024-09-16 DIAGNOSIS — Z23 Encounter for immunization: Secondary | ICD-10-CM

## 2024-09-16 DIAGNOSIS — R053 Chronic cough: Secondary | ICD-10-CM

## 2024-09-16 MED ORDER — RIVAROXABAN 20 MG PO TABS: 20.0000 mg | ORAL_TABLET | Freq: Every day | ORAL | 3 refills | Status: AC

## 2024-09-16 MED ORDER — CYANOCOBALAMIN 1000 MCG/ML IJ SOLN
1000.0000 ug | Freq: Once | INTRAMUSCULAR | Status: AC
  Administered 2024-09-16: 1000 ug via INTRAMUSCULAR

## 2024-09-16 MED ORDER — HYDROCODONE BIT-HOMATROP MBR 5-1.5 MG/5ML PO SOLN: 5.0000 mL | ORAL | 0 refills | Status: DC | PRN

## 2024-09-16 NOTE — Progress Notes (Signed)
   Subjective:    Patient ID: [name], female    DOB: 02-Mar-1946, 78 y.o.   MRN: [id number]  HPI Here for continued coughing and now with chest pain. We saw her on 09-03-23 after she was DC'd from the hospital, where a CTA of the chest revealed a RLL pneumonia. After receiving IV antibiotics there, she was sent home on Augmentin . She seemed to be improving since the cough changed from producing yellow sputum to clear sputum. However the cough has persisted since then, and now she has a sharp chest pain every time she coughs. She does not feel sick otherwise. She is still using Benzonatate  with limited success.    Review of Systems  Constitutional: Negative.   HENT: Negative.    Eyes: Negative.   Respiratory:  Positive for cough. Negative for shortness of breath and wheezing.   Cardiovascular:  Positive for chest pain. Negative for palpitations and leg swelling.       Objective:   Physical Exam Constitutional:      Appearance: Normal appearance.  Cardiovascular:     Rate and Rhythm: Normal rate and regular rhythm.     Pulses: Normal pulses.     Heart sounds: Normal heart sounds.  Pulmonary:     Effort: Pulmonary effort is normal.     Breath sounds: No wheezing, rhonchi or rales.     Comments: She is very tender to palpation over the right costosternal margin  Neurological:     Mental Status: She is alert.           Assessment & Plan:  I reassured her that the pneumonia has resolved, but the persistent cough has caused her to have a musculoskeletal pain in  her ribcage. I think this will resolve if we control the coughing for a week or so. She can use Hycodan syrup as needed.  Garnette Olmsted, MD

## 2024-09-16 NOTE — Addendum Note (Signed)
 Addended by: LADONNA INOCENTE SAILOR on: 09/16/2024 04:26 PM   Modules accepted: Orders

## 2024-10-05 ENCOUNTER — Ambulatory Visit (INDEPENDENT_AMBULATORY_CARE_PROVIDER_SITE_OTHER): Admitting: Family Medicine

## 2024-10-05 ENCOUNTER — Encounter: Payer: Self-pay | Admitting: Family Medicine

## 2024-10-05 VITALS — BP 122/80 | HR 106 | Temp 98.2°F | Wt 138.2 lb

## 2024-10-05 DIAGNOSIS — E538 Deficiency of other specified B group vitamins: Secondary | ICD-10-CM

## 2024-10-05 DIAGNOSIS — R051 Acute cough: Secondary | ICD-10-CM

## 2024-10-05 DIAGNOSIS — J029 Acute pharyngitis, unspecified: Secondary | ICD-10-CM

## 2024-10-05 DIAGNOSIS — J020 Streptococcal pharyngitis: Secondary | ICD-10-CM

## 2024-10-05 LAB — POC COVID19 BINAXNOW: SARS Coronavirus 2 Ag: NEGATIVE

## 2024-10-05 LAB — POCT INFLUENZA A/B
Influenza A, POC: NEGATIVE
Influenza B, POC: NEGATIVE

## 2024-10-05 LAB — POCT RAPID STREP A (OFFICE): Rapid Strep A Screen: POSITIVE — AB

## 2024-10-05 MED ORDER — CEFUROXIME AXETIL 500 MG PO TABS: 500.0000 mg | ORAL_TABLET | Freq: Two times a day (BID) | ORAL | 0 refills | Status: AC

## 2024-10-05 NOTE — Progress Notes (Signed)
   Subjective:    Patient ID: [name], female    DOB: [birth year], 78 y.o.   MRN: [id number]  HPI Here for 4 days of low grade fever and a right sided ST. She is also coughing by no more than usual.    Review of Systems  Constitutional:  Positive for fever.  HENT:  Positive for sore throat. Negative for ear pain, postnasal drip and trouble swallowing.   Eyes: Negative.   Respiratory:  Positive for cough.        Objective:   Physical Exam Constitutional:      Appearance: Normal appearance. She is well-developed.  HENT:     Right Ear: Tympanic membrane, ear canal and external ear normal.     Left Ear: Tympanic membrane, ear canal and external ear normal.     Nose: Nose normal.     Mouth/Throat:     Pharynx: Posterior oropharyngeal erythema present. No oropharyngeal exudate.  Eyes:     Conjunctiva/sclera: Conjunctivae normal.  Pulmonary:     Effort: Pulmonary effort is normal.     Breath sounds: Rhonchi present. No wheezing or rales.  Lymphadenopathy:     Cervical: Cervical adenopathy present.  Neurological:     Mental Status: She is alert.           Assessment & Plan:  Strep pharyngitis. Treat with 10 days of Cefuroxime.  Garnette Olmsted, MD

## 2024-10-19 ENCOUNTER — Ambulatory Visit (INDEPENDENT_AMBULATORY_CARE_PROVIDER_SITE_OTHER)

## 2024-10-19 DIAGNOSIS — E538 Deficiency of other specified B group vitamins: Secondary | ICD-10-CM | POA: Diagnosis not present

## 2024-10-19 MED ORDER — CYANOCOBALAMIN 1000 MCG/ML IJ SOLN
1000.0000 ug | Freq: Once | INTRAMUSCULAR | Status: AC
  Administered 2024-10-19: 1000 ug via INTRAMUSCULAR

## 2024-10-19 NOTE — Progress Notes (Signed)
 Patient is in office today for a nurse visit for B12 Injection. Patient Injection was given in the  Left deltoid. Patient tolerated injection well.

## 2024-11-02 ENCOUNTER — Ambulatory Visit

## 2024-11-02 DIAGNOSIS — E538 Deficiency of other specified B group vitamins: Secondary | ICD-10-CM

## 2024-11-02 MED ORDER — CYANOCOBALAMIN 1000 MCG/ML IJ SOLN
1000.0000 ug | Freq: Once | INTRAMUSCULAR | Status: AC
  Administered 2024-11-02: 1000 ug via INTRAMUSCULAR

## 2024-11-02 NOTE — Progress Notes (Signed)
 Patient is in office today for a nurse visit for B12 Injection. Patient Injection was given in the  Left deltoid. Patient tolerated injection well.

## 2024-11-03 ENCOUNTER — Ambulatory Visit: Admitting: Family Medicine

## 2024-11-03 ENCOUNTER — Encounter: Payer: Self-pay | Admitting: Family Medicine

## 2024-11-03 ENCOUNTER — Ambulatory Visit

## 2024-11-03 VITALS — BP 132/78 | Temp 97.7°F | Resp 20 | Wt 138.8 lb

## 2024-11-03 DIAGNOSIS — R042 Hemoptysis: Secondary | ICD-10-CM

## 2024-11-03 DIAGNOSIS — J189 Pneumonia, unspecified organism: Secondary | ICD-10-CM

## 2024-11-03 LAB — CBC WITH DIFFERENTIAL/PLATELET
Basophils Absolute: 0 K/uL (ref 0.0–0.1)
Basophils Relative: 0.8 % (ref 0.0–3.0)
Eosinophils Absolute: 0.1 K/uL (ref 0.0–0.7)
Eosinophils Relative: 2.7 % (ref 0.0–5.0)
HCT: 43.1 % (ref 36.0–46.0)
Hemoglobin: 14.6 g/dL (ref 12.0–15.0)
Lymphocytes Relative: 22.7 % (ref 12.0–46.0)
Lymphs Abs: 1 K/uL (ref 0.7–4.0)
MCHC: 33.8 g/dL (ref 30.0–36.0)
MCV: 87.8 fl (ref 78.0–100.0)
Monocytes Absolute: 0.4 K/uL (ref 0.1–1.0)
Monocytes Relative: 8.9 % (ref 3.0–12.0)
Neutro Abs: 2.7 K/uL (ref 1.4–7.7)
Neutrophils Relative %: 64.9 % (ref 43.0–77.0)
Platelets: 227 K/uL (ref 150.0–400.0)
RBC: 4.9 Mil/uL (ref 3.87–5.11)
RDW: 13.4 % (ref 11.5–15.5)
WBC: 4.2 K/uL (ref 4.0–10.5)

## 2024-11-03 MED ORDER — CEFTRIAXONE SODIUM 1 G IJ SOLR
1.0000 g | Freq: Once | INTRAMUSCULAR | Status: AC
  Administered 2024-11-03: 1 g via INTRAMUSCULAR

## 2024-11-03 MED ORDER — AMOXICILLIN-POT CLAVULANATE 875-125 MG PO TABS: 1.0000 | ORAL_TABLET | Freq: Two times a day (BID) | ORAL | 0 refills | Status: AC

## 2024-11-03 NOTE — Progress Notes (Signed)
   Subjective:    Patient ID: [name], female    DOB: 10/20/1946, 78 y.o.   MRN: [id number]  HPI Here for 2 days of a cough that produces clear sputum mixed with bright red blood. When she coughs she feels a mild sharp pain in the right lower chest area. She is not more SOB than usual. No fever. She was in the hospital from 08-24-24 to 08-28-24 for hemoptysis due to a RLL pneumonia. She seemed to recover from this, and then we saw her on 10-05-24 for a Strep pharyngitis. This resolved with a course of Cefuroxime .    Review of Systems  Constitutional:  Positive for fatigue. Negative for fever.  HENT: Negative.    Eyes: Negative.   Respiratory:  Positive for cough and shortness of breath. Negative for wheezing.   Cardiovascular: Negative.        Objective:   Physical Exam Constitutional:      Appearance: Normal appearance.  Cardiovascular:     Rate and Rhythm: Normal rate and regular rhythm.     Pulses: Normal pulses.     Heart sounds: Normal heart sounds.  Pulmonary:     Effort: Pulmonary effort is normal.     Breath sounds: No wheezing.     Comments: There is consolidation in the right lung base  Neurological:     Mental Status: She is alert.           Assessment & Plan:  She has a cough with hemoptysis consistent with a recurrent RLL pneumonia. We will get a CXR today, as well as a CBC and D dimer. She is given a shot of Rocephin , to be followed by 10 days of Augmentin . Recheck next week. I personally spent a total of 34 minutes in the care of the patient today including getting/reviewing separately obtained history, performing a medically appropriate exam/evaluation, documenting clinical information in the EHR, and communicating results.  Garnette Olmsted, MD

## 2024-11-03 NOTE — Progress Notes (Signed)
 Patient is in office today for a nurse visit for Ceftriaxone  1g. Patient Injection was given in the  Left upper quad. gluteus. Patient tolerated injection well.

## 2024-11-03 NOTE — Addendum Note (Signed)
 Addended by: DIONISIO CAMELIA PARAS on: 11/03/2024 10:56 AM   Modules accepted: Orders

## 2024-11-04 ENCOUNTER — Ambulatory Visit: Payer: Self-pay | Admitting: Family Medicine

## 2024-11-04 LAB — D-DIMER, QUANTITATIVE: D-Dimer, Quant: 0.3 ug{FEU}/mL (ref <0.50)

## 2024-11-17 ENCOUNTER — Ambulatory Visit

## 2024-11-17 DIAGNOSIS — E538 Deficiency of other specified B group vitamins: Secondary | ICD-10-CM

## 2024-11-17 MED ORDER — CYANOCOBALAMIN 1000 MCG/ML IJ SOLN
1000.0000 ug | Freq: Once | INTRAMUSCULAR | Status: AC
  Administered 2024-11-17: 1000 ug via INTRAMUSCULAR

## 2024-11-17 NOTE — Progress Notes (Signed)
 Patient is in office today for a nurse visit for B12 Injection. Patient Injection was given in the  Left deltoid. Patient tolerated injection well.

## 2024-11-30 ENCOUNTER — Other Ambulatory Visit: Payer: Self-pay | Admitting: Family Medicine

## 2024-11-30 MED ORDER — HYDROCODONE BIT-HOMATROP MBR 5-1.5 MG/5ML PO SOLN: 5.0000 mL | ORAL | 0 refills | Status: AC | PRN

## 2024-11-30 NOTE — Telephone Encounter (Signed)
 Copied from CRM [id number]. Topic: Clinical - Medication Refill >> Nov 30, 2024  9:42 AM Zebedee SAUNDERS wrote: Medication: HYDROcodone  bit-homatropine (HYCODAN) 5-1.5 MG/5ML syrup  Has the patient contacted their pharmacy? Yes (Agent: If no, request that the patient contact the pharmacy for the refill. If patient does not wish to contact the pharmacy document the reason why and proceed with request.) (Agent: If yes, when and what did the pharmacy advise?)  This is the patient's preferred pharmacy:  [facility] [address] [place] - [address] [place] [address] Phone: [phone] Fax: [phone]  Is this the correct pharmacy for this prescription? Yes If no, delete pharmacy and type the correct one.   Has the prescription been filled recently? Yes  Is the patient out of the medication? Yes  Has the patient been seen for an appointment in the last year OR does the patient have an upcoming appointment? Yes  Can we respond through MyChart? Yes  Agent: Please be advised that Rx refills may take up to 3 business days. We ask that you follow-up with your pharmacy.

## 2024-12-01 ENCOUNTER — Ambulatory Visit

## 2024-12-09 ENCOUNTER — Ambulatory Visit (INDEPENDENT_AMBULATORY_CARE_PROVIDER_SITE_OTHER)

## 2024-12-09 DIAGNOSIS — E538 Deficiency of other specified B group vitamins: Secondary | ICD-10-CM | POA: Diagnosis not present

## 2024-12-09 MED ORDER — CYANOCOBALAMIN 1000 MCG/ML IJ SOLN
1000.0000 ug | Freq: Once | INTRAMUSCULAR | Status: AC
  Administered 2024-12-09: 1000 ug via INTRAMUSCULAR

## 2024-12-09 NOTE — Progress Notes (Signed)
 Pt here for monthly B12 injection per Dr Johnny  B12 1000mcg given IM and pt tolerated injection well.  Next B12 injection scheduled for 12/21/24

## 2024-12-21 ENCOUNTER — Ambulatory Visit
